# Patient Record
Sex: Female | Born: 1940 | Race: White | Hispanic: No | Marital: Married | Smoking: Never smoker
Health system: Southern US, Community
[De-identification: ages and names within clinical notes are randomized; demographics above are authoritative.]

## PROBLEM LIST (undated history)

## (undated) DIAGNOSIS — N186 End stage renal disease: Secondary | ICD-10-CM

## (undated) DIAGNOSIS — N189 Chronic kidney disease, unspecified: Secondary | ICD-10-CM

## (undated) DIAGNOSIS — D631 Anemia in chronic kidney disease: Secondary | ICD-10-CM

## (undated) DIAGNOSIS — H35039 Hypertensive retinopathy, unspecified eye: Secondary | ICD-10-CM

## (undated) DIAGNOSIS — M1990 Unspecified osteoarthritis, unspecified site: Secondary | ICD-10-CM

## (undated) DIAGNOSIS — I719 Aortic aneurysm of unspecified site, without rupture: Secondary | ICD-10-CM

## (undated) DIAGNOSIS — I350 Nonrheumatic aortic (valve) stenosis: Secondary | ICD-10-CM

## (undated) DIAGNOSIS — E785 Hyperlipidemia, unspecified: Secondary | ICD-10-CM

## (undated) DIAGNOSIS — M48062 Spinal stenosis, lumbar region with neurogenic claudication: Secondary | ICD-10-CM

## (undated) DIAGNOSIS — M5416 Radiculopathy, lumbar region: Secondary | ICD-10-CM

## (undated) DIAGNOSIS — D649 Anemia, unspecified: Secondary | ICD-10-CM

## (undated) DIAGNOSIS — E213 Hyperparathyroidism, unspecified: Secondary | ICD-10-CM

## (undated) DIAGNOSIS — Z9221 Personal history of antineoplastic chemotherapy: Secondary | ICD-10-CM

## (undated) DIAGNOSIS — I10 Essential (primary) hypertension: Secondary | ICD-10-CM

## (undated) DIAGNOSIS — D472 Monoclonal gammopathy: Secondary | ICD-10-CM

## (undated) DIAGNOSIS — T753XXA Motion sickness, initial encounter: Secondary | ICD-10-CM

## (undated) DIAGNOSIS — E119 Type 2 diabetes mellitus without complications: Secondary | ICD-10-CM

## (undated) DIAGNOSIS — Z923 Personal history of irradiation: Secondary | ICD-10-CM

## (undated) DIAGNOSIS — T884XXA Failed or difficult intubation, initial encounter: Secondary | ICD-10-CM

## (undated) DIAGNOSIS — H409 Unspecified glaucoma: Secondary | ICD-10-CM

## (undated) DIAGNOSIS — G8929 Other chronic pain: Secondary | ICD-10-CM

## (undated) DIAGNOSIS — G459 Transient cerebral ischemic attack, unspecified: Secondary | ICD-10-CM

## (undated) DIAGNOSIS — T8859XA Other complications of anesthesia, initial encounter: Secondary | ICD-10-CM

## (undated) DIAGNOSIS — E113299 Type 2 diabetes mellitus with mild nonproliferative diabetic retinopathy without macular edema, unspecified eye: Secondary | ICD-10-CM

## (undated) DIAGNOSIS — Z992 Dependence on renal dialysis: Secondary | ICD-10-CM

## (undated) DIAGNOSIS — K219 Gastro-esophageal reflux disease without esophagitis: Secondary | ICD-10-CM

## (undated) DIAGNOSIS — M545 Low back pain, unspecified: Secondary | ICD-10-CM

## (undated) DIAGNOSIS — C801 Malignant (primary) neoplasm, unspecified: Secondary | ICD-10-CM

## (undated) DIAGNOSIS — R011 Cardiac murmur, unspecified: Secondary | ICD-10-CM

## (undated) HISTORY — DX: Type 2 diabetes mellitus without complications: E11.9

## (undated) HISTORY — DX: Essential (primary) hypertension: I10

## (undated) HISTORY — PX: OTHER SURGICAL HISTORY: SHX169

## (undated) HISTORY — DX: Chronic kidney disease, unspecified: N18.9

## (undated) HISTORY — PX: BREAST BIOPSY: SHX20

## (undated) HISTORY — PX: COLON SURGERY: SHX602

## (undated) HISTORY — PX: TUBAL LIGATION: SHX77

## (undated) HISTORY — PX: BACK SURGERY: SHX140

## (undated) HISTORY — DX: Anemia, unspecified: D64.9

---
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
## 2008-10-31 DIAGNOSIS — R42 Dizziness and giddiness: Secondary | ICD-10-CM | POA: Insufficient documentation

## 2008-10-31 DIAGNOSIS — E7800 Pure hypercholesterolemia, unspecified: Secondary | ICD-10-CM | POA: Insufficient documentation

## 2009-03-10 DIAGNOSIS — C801 Malignant (primary) neoplasm, unspecified: Secondary | ICD-10-CM

## 2009-03-10 DIAGNOSIS — C20 Malignant neoplasm of rectum: Secondary | ICD-10-CM

## 2009-03-10 HISTORY — DX: Malignant neoplasm of rectum: C20

## 2009-03-10 HISTORY — DX: Malignant (primary) neoplasm, unspecified: C80.1

## 2009-03-29 DIAGNOSIS — C20 Malignant neoplasm of rectum: Secondary | ICD-10-CM | POA: Insufficient documentation

## 2009-04-03 DIAGNOSIS — I10 Essential (primary) hypertension: Secondary | ICD-10-CM | POA: Insufficient documentation

## 2010-03-26 DIAGNOSIS — E119 Type 2 diabetes mellitus without complications: Secondary | ICD-10-CM | POA: Insufficient documentation

## 2012-08-10 DIAGNOSIS — H40003 Preglaucoma, unspecified, bilateral: Secondary | ICD-10-CM | POA: Insufficient documentation

## 2012-08-10 DIAGNOSIS — H43813 Vitreous degeneration, bilateral: Secondary | ICD-10-CM | POA: Insufficient documentation

## 2014-01-04 DIAGNOSIS — M25539 Pain in unspecified wrist: Secondary | ICD-10-CM | POA: Insufficient documentation

## 2014-01-19 DIAGNOSIS — R202 Paresthesia of skin: Secondary | ICD-10-CM | POA: Insufficient documentation

## 2015-01-11 DIAGNOSIS — H26499 Other secondary cataract, unspecified eye: Secondary | ICD-10-CM | POA: Insufficient documentation

## 2015-01-11 DIAGNOSIS — Z961 Presence of intraocular lens: Secondary | ICD-10-CM | POA: Insufficient documentation

## 2017-05-19 DIAGNOSIS — C9000 Multiple myeloma not having achieved remission: Secondary | ICD-10-CM | POA: Insufficient documentation

## 2017-05-22 DIAGNOSIS — E790 Hyperuricemia without signs of inflammatory arthritis and tophaceous disease: Secondary | ICD-10-CM | POA: Insufficient documentation

## 2017-07-02 DIAGNOSIS — I350 Nonrheumatic aortic (valve) stenosis: Secondary | ICD-10-CM

## 2017-07-02 DIAGNOSIS — I5189 Other ill-defined heart diseases: Secondary | ICD-10-CM

## 2017-07-02 HISTORY — DX: Other ill-defined heart diseases: I51.89

## 2017-07-02 HISTORY — DX: Nonrheumatic aortic (valve) stenosis: I35.0

## 2017-08-19 DIAGNOSIS — S42126A Nondisplaced fracture of acromial process, unspecified shoulder, initial encounter for closed fracture: Secondary | ICD-10-CM | POA: Insufficient documentation

## 2017-11-10 DIAGNOSIS — D472 Monoclonal gammopathy: Secondary | ICD-10-CM | POA: Insufficient documentation

## 2018-08-31 DIAGNOSIS — N184 Chronic kidney disease, stage 4 (severe): Secondary | ICD-10-CM | POA: Insufficient documentation

## 2018-10-08 ENCOUNTER — Other Ambulatory Visit: Payer: Self-pay | Admitting: Family Medicine

## 2018-10-08 DIAGNOSIS — Z780 Asymptomatic menopausal state: Secondary | ICD-10-CM

## 2018-10-08 DIAGNOSIS — Z1231 Encounter for screening mammogram for malignant neoplasm of breast: Secondary | ICD-10-CM

## 2018-10-13 NOTE — Progress Notes (Signed)
[facility]  [address] [place], [place] [address] Phone: [phone]  Fax: [phone]   Clinic Day:  10/15/2018  Referring physician: Gayland Curry, MD  Chief Complaint: Misty [name] is a 78 y.o. female with a history of anemia of chronic kidney disease who is referred in consultation by Dr Misty [name] for assessment and management.   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 She has a history of monthly erythropoietin injections with Aranesp.   She was previously treated at the [facility] in [place], [place].  She has a history of rectal cancer.  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 Transrectal ultrasound confirmed a 4 cm mass from the anal verge, encompassing 1/3 of the circumference of the rectum. Clinical stage by EUS was T3N0.  Pre-op imaging showed no distant disease. She underwent neoadjuvant chemotherapy/radiation with 3 cycles of CI 5FU due to poor tolerance.  Resection on 08/08/2009 showed no residual carcinoma. She underwent a restorative proctectomy with colonic J-pouch and coloanal anastomosis. Two lymph nodes were removed; no tumor was noted. She declined adjuvant chemotherapy.  Colonoscopy in 03/2017 showed no evidence of recurrence.   She has a history of a monoclonal gammopathy of unknown significance (MGUS). SPEP in 05/2016 revealed no quantifiable monoclonal spike and no paraprotein in serum by immunofixation. There were free kappa light chains by urine immunofixation, unquantifiable by urine protein electrophoresis.   Bone marrow biopsy on 02/04/2017 was sub-optimal.  There was a markedly hemodilute bone marrow aspirate smear, inadequate for evaluation of hematopoesis. Core biopsy showed markedly hypocellular bone marrow, essentially devoid of hemapoietic precursors. Congo red stains on the core  biopsy and abdominal wall fat pad aspiration were negative.  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    She was briefly treated with Velcade and Decadron beginning in 08/12/2017.  Treatment was complicated by hyperglycemia and diarrhea.  Decision was made to pursue restaging.  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  There was very variably cellular marrow, overall 50% with trilineage hematopoiesis.  There were abnormal plasma cells present at normal plasma cell background detected by flow cytometry.  Findings were consistent with a low level of involvement of bone marrow with the patient's previously diagnosed plasma cell neoplasm.  24-hour UPEP on 12/28/2017 revealed 1911 mg/24 hrs protein with no M-spike and normal immunofixation.  Kappa free light chains were 101, lambda free light chain 6.39 and a ratio of 15.81 (2.04 - 10.37).  Whole body MRI on 02/04/2018 revealed no evidence of discrete focal dominant marrow lesion.   CBC on 09/07/2018 included a hematocrit of 31.1, hemoglobin 10.0, MCV 91, platelets 287,000, white count 6200. CMP revealed a BUN of 23, creatinine 2.7. Calcium was 9.1, albumin 3.4, protein 6.5, and liver function tests normal.  Symptomatically, she feels "pretty good." She feels good in the morning and notes fatigue in there afternoon. She denies any fevers, sweats, or weight loss. She denies any headaches, visual changes, cough, shortness of breath, or chest pain. She notes diarrhea following her surgery in 2011 and takes Imodium daily. She notes that diarrhea is not every day, and often comes on severely. She monitors her diet and does not eat spicy food. She denies any urinary symptoms. She notes a prolapsed bladder. She has joint aches in her hips. She  denies any recurrent infections.   She currently takes oral iron 3x weekly.   She has an  appointment with Misty [name] in GI at the [facility] clinic for her chronic diarrhea. She will see Misty [name] in nephrology next week.    Past Medical History:  Diagnosis Date   Anemia    Chronic kidney disease    Diabetes mellitus without complication ([name])    type 2   Hypertension     Past Surgical History:  Procedure Laterality Date   coloretcal cancer     thyroid gland     surgery / unknown date    History reviewed. No pertinent family history.  Social History:  reports that she has never smoked. She has never used smokeless tobacco. She reports that she does not drink alcohol. No history on file for drug. She is originally from [place] but moved to [place] in 1972. She and her husband moved from [place] to [place] to be near their grandchildren in 04/2018. She denies any alcohol or tobacco use. She denies any exposure to radiation or toxins. 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 She lives in [place]. The patient is alone today.  Allergies:  Allergies  Allergen Reactions   Penicillins Rash    Current Medications: Current Outpatient Medications  Medication Sig Dispense Refill   atorvastatin (LIPITOR) 40 MG tablet      B Complex-C-Folic Acid (RENAL) 1 MG CAPS TK 1 C PO D     carvedilol (COREG) 3.125 MG tablet      Cetirizine HCl 10 MG CAPS      clopidogrel (PLAVIX) 75 MG tablet Take by mouth.     colestipol (COLESTID) 1 g tablet Take by mouth.     Ferrous Sulfate (IRON PO) Take by mouth.     ferrous sulfate 325 (65 FE) MG tablet      Fiber Diet TABS Take by mouth.     gabapentin (NEURONTIN) 300 MG capsule      glimepiride (AMARYL) 2 MG tablet      glucose blood test strip 1 each by Finger Stick route 4 times daily (after meals and nightly). Use as directed     HUMALOG 100 UNIT/ML injection      hydrALAZINE (APRESOLINE) 25 MG tablet      insulin glargine (LANTUS) 100 UNIT/ML injection Inject into the skin.     INSULIN SYRINGE 1CC/29G 29G X 1/2" 1 ML  MISC      latanoprost (XALATAN) 0.005 % ophthalmic solution      lisinopril (ZESTRIL) 10 MG tablet      NIFEdipine (ADALAT CC) 60 MG 24 hr tablet TAKE 1 TABLET DAILY     SODIUM BICARBONATE PO Take by mouth.     SURE COMFORT PEN NEEDLES 31G X 8 MM MISC      acetaminophen (TYLENOL) 325 MG tablet Take by mouth.     No current facility-administered medications for this visit.     Review of Systems  Constitutional: Positive for malaise/fatigue. Negative for chills, diaphoresis, fever and weight loss.       Feels "pretty good."  HENT: Negative.  Negative for congestion, hearing loss, sinus pain and sore throat.   Eyes: Negative.  Negative for blurred vision.  Respiratory: Negative.  Negative for cough, shortness of breath and wheezing.   Cardiovascular: Negative.  Negative for chest pain, palpitations, orthopnea, leg swelling and PND.  Gastrointestinal: Positive for diarrhea (chronic). Negative for abdominal pain, blood in stool, constipation, melena, nausea  and vomiting.  Genitourinary: Negative.  Negative for dysuria, frequency, hematuria and urgency.       Prolapsed bladder  Musculoskeletal: Positive for joint pain (hips). Negative for back pain and myalgias.  Skin: Negative.  Negative for rash.  Neurological: Negative.  Negative for dizziness, tingling, sensory change, weakness and headaches.  Endo/Heme/Allergies: Negative.  Does not bruise/bleed easily.  Psychiatric/Behavioral: Negative.  Negative for depression, memory loss and substance abuse. The patient is not nervous/anxious and does not have insomnia.   All other systems reviewed and are negative.  Performance status (ECOG): 1  Vitals Blood pressure 135/66, pulse 62, temperature (!) 97 F (36.1 C), temperature source Tympanic, resp. rate 18, height '5\' 3"'$  (1.6 m), weight 133 lb 14.9 oz (60.7 kg), SpO2 100 %.   Physical Exam  Constitutional: She is oriented to person, place, and time. She appears well-developed and  well-nourished. No distress.  HENT:  Head: Normocephalic and atraumatic.  Mouth/Throat: Oropharynx is clear and moist. No oropharyngeal exudate.  Short styled white hair. Mask.  Eyes: Pupils are equal, round, and reactive to light. Conjunctivae and EOM are normal. No scleral icterus.  Blue eyes.  Neck: Normal range of motion. Neck supple.  Cardiovascular: Normal rate, regular rhythm and normal heart sounds.  No murmur heard. Pulmonary/Chest: Effort normal and breath sounds normal. No respiratory distress. She has no wheezes. She has no rales.  Abdominal: Soft. Bowel sounds are normal. She exhibits no distension and no mass. There is no abdominal tenderness. There is no rebound and no guarding.  Bruising secondary to insulin injections  Musculoskeletal: Normal range of motion.        General: No edema.  Lymphadenopathy:    She has no cervical adenopathy.    She has no axillary adenopathy.       Right: No supraclavicular adenopathy present.       Left: No supraclavicular adenopathy present.  Neurological: She is alert and oriented to person, place, and time.  Skin: Skin is warm and dry. She is not diaphoretic. No erythema.  Psychiatric: She has a normal mood and affect. Her behavior is normal. Judgment and thought content normal.  Nursing note and vitals reviewed.   No visits with results within 3 Day(s) from this visit.  Latest known visit with results is:  No results found for any previous visit.    Assessment:  Misty [name] is a 78 y.o. female with anemia of chronic renal disease.  She received monthly Aranesp while living in [place].  She has chronic renal insufficiency.  Creatinine was 2.7 (creatinine clearance 17 ml/min) on 09/07/2018.  She has a monoclonal gammopathy or unknown significance (MGUS).  Bone marrow biopsy on 02/04/2017 was sub-optimal.  Congo red stains and abdominal wall fat pad aspiration were negative.  She was briefly treated with Velcade and Decadron  beginning in 08/12/2017.  Treatment was complicated by hyperglycemia and diarrhea.  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  There was very variably cellular marrow, overall 50% with trilineage hematopoiesis.  There were abnormal plasma cells present at normal plasma cell background detected by flow cytometry.  Findings were consistent with a low level of involvement of bone marrow with the patient's previously diagnosed plasma cell neoplasm.  24-hour UPEP on 12/28/2017 revealed 1911 mg/24 hrs protein with no M-spike and normal immunofixation.  Kappa free light chains were 101, lambda free light chain 6.39 and a ratio of 15.81 (2.04 - 10.37).  Whole body MRI on 02/04/2018 revealed no evidence of discrete focal dominant marrow lesion.   She has a history of rectal cancer s/p neoadjuvant chemotherapy followed by resection on 08/08/2009. Transrectal ultrasound in 03/2009 confirmed a 4 cm mass from the anal verge, encompassing 1/3 of the circumference of the rectum. Clinical stage was T3N0.  She received 3 cycles of CI 5FU.  There was no residual carcinoma on resection. She underwent a restorative proctectomy with colonic J-pouch and coloanal anastomosis. Two lymph nodes were negative. She declined adjuvant chemotherapy.  Colonoscopy in 03/2017 showed no evidence of recurrence.   Symptomatically, she notes fatigue in the afternoon.  She has well managed diarrhea s/p surgery.  She denies any B symptoms.  Exam is unremarkable.  Plan: 1. Labs today: CBC with diff, ferritin, iron studies, B12, folate, myeloma panel, FLCA, CEA. 2. 24-hour UPEP and free light chains.  3. Anemia of chronic kidney disease  Review entire medical history, diagnosis and management of anemia of chronic kidney diseae.   She has received Aranesp in the past (obtain records).  Discuss ensuring adequate iron stores and vitamin levels.  Preauth  Aranesp. 4. Monoclonal gammopathy or unknown significance (MGUS)  Review entire medical history, diagnosis and management of MGUS.   She was briefly on treatment (intolerant) for ? myeloma.  Discuss restaging studies. 5. Rectal cancer   Review entire medical history, diagnosis and management of rectal cancer.   Discuss ongoing surveillance. 6. RTC in 1 week for MD assessment, review of work-up, labs (CBC), and +/- Aranesp.  I discussed the assessment and treatment plan with the patient.  The patient was provided an opportunity to ask questions and all were answered.  The patient agreed with the plan and demonstrated an understanding of the instructions.  The patient was advised to call back if the symptoms worsen or if the condition fails to improve as anticipated.   Onie Hayashi C. Mike Gip, MD, PhD    10/15/2018, 9:56 AM  I, Cloyde Reams Dorshimer, am acting as Education administrator for [employer]. Mike Gip, MD, PhD.  I, Ellen Goris C. Mike Gip, MD, have reviewed the above documentation for accuracy and completeness, and I agree with the above.

## 2018-10-14 ENCOUNTER — Other Ambulatory Visit: Payer: Self-pay

## 2018-10-15 ENCOUNTER — Encounter: Payer: Self-pay | Admitting: Hematology and Oncology

## 2018-10-15 ENCOUNTER — Inpatient Hospital Stay: Payer: Medicare Other | Attending: Hematology and Oncology | Admitting: Hematology and Oncology

## 2018-10-15 ENCOUNTER — Inpatient Hospital Stay: Payer: Medicare Other

## 2018-10-15 VITALS — BP 135/66 | HR 62 | Temp 97.0°F | Resp 18 | Ht 63.0 in | Wt 133.9 lb

## 2018-10-15 DIAGNOSIS — Z85048 Personal history of other malignant neoplasm of rectum, rectosigmoid junction, and anus: Secondary | ICD-10-CM

## 2018-10-15 DIAGNOSIS — N184 Chronic kidney disease, stage 4 (severe): Secondary | ICD-10-CM

## 2018-10-15 DIAGNOSIS — Z880 Allergy status to penicillin: Secondary | ICD-10-CM | POA: Diagnosis not present

## 2018-10-15 DIAGNOSIS — N179 Acute kidney failure, unspecified: Secondary | ICD-10-CM | POA: Insufficient documentation

## 2018-10-15 DIAGNOSIS — R197 Diarrhea, unspecified: Secondary | ICD-10-CM | POA: Insufficient documentation

## 2018-10-15 DIAGNOSIS — E1122 Type 2 diabetes mellitus with diabetic chronic kidney disease: Secondary | ICD-10-CM | POA: Insufficient documentation

## 2018-10-15 DIAGNOSIS — D631 Anemia in chronic kidney disease: Secondary | ICD-10-CM

## 2018-10-15 DIAGNOSIS — M25552 Pain in left hip: Secondary | ICD-10-CM | POA: Diagnosis not present

## 2018-10-15 DIAGNOSIS — C20 Malignant neoplasm of rectum: Secondary | ICD-10-CM

## 2018-10-15 DIAGNOSIS — I129 Hypertensive chronic kidney disease with stage 1 through stage 4 chronic kidney disease, or unspecified chronic kidney disease: Secondary | ICD-10-CM

## 2018-10-15 DIAGNOSIS — R5383 Other fatigue: Secondary | ICD-10-CM | POA: Diagnosis not present

## 2018-10-15 DIAGNOSIS — Z79899 Other long term (current) drug therapy: Secondary | ICD-10-CM | POA: Insufficient documentation

## 2018-10-15 DIAGNOSIS — D472 Monoclonal gammopathy: Secondary | ICD-10-CM

## 2018-10-15 DIAGNOSIS — Z794 Long term (current) use of insulin: Secondary | ICD-10-CM | POA: Diagnosis not present

## 2018-10-15 DIAGNOSIS — E1165 Type 2 diabetes mellitus with hyperglycemia: Secondary | ICD-10-CM | POA: Diagnosis not present

## 2018-10-15 DIAGNOSIS — S42113A Displaced fracture of body of scapula, unspecified shoulder, initial encounter for closed fracture: Secondary | ICD-10-CM | POA: Insufficient documentation

## 2018-10-15 DIAGNOSIS — M25551 Pain in right hip: Secondary | ICD-10-CM | POA: Insufficient documentation

## 2018-10-15 LAB — CBC WITH DIFFERENTIAL/PLATELET
Abs Immature Granulocytes: 0.04 10*3/uL (ref 0.00–0.07)
Basophils Absolute: 0.1 10*3/uL (ref 0.0–0.1)
Basophils Relative: 1 %
Eosinophils Absolute: 0.4 10*3/uL (ref 0.0–0.5)
Eosinophils Relative: 6 %
HCT: 33.3 % — ABNORMAL LOW (ref 36.0–46.0)
Hemoglobin: 11 g/dL — ABNORMAL LOW (ref 12.0–15.0)
Immature Granulocytes: 1 %
Lymphocytes Relative: 19 %
Lymphs Abs: 1.4 10*3/uL (ref 0.7–4.0)
MCH: 29.3 pg (ref 26.0–34.0)
MCHC: 33 g/dL (ref 30.0–36.0)
MCV: 88.6 fL (ref 80.0–100.0)
Monocytes Absolute: 0.4 10*3/uL (ref 0.1–1.0)
Monocytes Relative: 6 %
Neutro Abs: 5.1 10*3/uL (ref 1.7–7.7)
Neutrophils Relative %: 67 %
Platelets: 304 10*3/uL (ref 150–400)
RBC: 3.76 MIL/uL — ABNORMAL LOW (ref 3.87–5.11)
RDW: 13.3 % (ref 11.5–15.5)
WBC: 7.4 10*3/uL (ref 4.0–10.5)
nRBC: 0 % (ref 0.0–0.2)

## 2018-10-15 LAB — IRON AND TIBC
Iron: 82 ug/dL (ref 28–170)
Saturation Ratios: 26 % (ref 10.4–31.8)
TIBC: 315 ug/dL (ref 250–450)
UIBC: 233 ug/dL

## 2018-10-15 LAB — VITAMIN B12: Vitamin B-12: 511 pg/mL (ref 180–914)

## 2018-10-15 LAB — FOLATE: Folate: >100 ng/mL (ref >5.9)

## 2018-10-15 LAB — FERRITIN: Ferritin: 135 ng/mL (ref 11–307)

## 2018-10-16 LAB — CEA: CEA: 1.5 ng/mL (ref 0.0–4.7)

## 2018-10-17 LAB — KAPPA/LAMBDA LIGHT CHAINS
Kappa free light chain: 110.2 mg/L — ABNORMAL HIGH (ref 3.3–19.4)
Kappa, lambda light chain ratio: 2.86 — ABNORMAL HIGH (ref 0.26–1.65)
Lambda free light chains: 38.5 mg/L — ABNORMAL HIGH (ref 5.7–26.3)

## 2018-10-18 ENCOUNTER — Other Ambulatory Visit: Payer: Self-pay

## 2018-10-18 DIAGNOSIS — I129 Hypertensive chronic kidney disease with stage 1 through stage 4 chronic kidney disease, or unspecified chronic kidney disease: Secondary | ICD-10-CM | POA: Diagnosis not present

## 2018-10-18 DIAGNOSIS — D631 Anemia in chronic kidney disease: Secondary | ICD-10-CM

## 2018-10-18 DIAGNOSIS — D472 Monoclonal gammopathy: Secondary | ICD-10-CM

## 2018-10-19 LAB — MULTIPLE MYELOMA PANEL, SERUM
Albumin SerPl Elph-Mcnc: 3.9 g/dL (ref 2.9–4.4)
Albumin/Glob SerPl: 1.2 (ref 0.7–1.7)
Alpha 1: 0.2 g/dL (ref 0.0–0.4)
Alpha2 Glob SerPl Elph-Mcnc: 1.1 g/dL — ABNORMAL HIGH (ref 0.4–1.0)
B-Globulin SerPl Elph-Mcnc: 1 g/dL (ref 0.7–1.3)
Gamma Glob SerPl Elph-Mcnc: 0.9 g/dL (ref 0.4–1.8)
Globulin, Total: 3.3 g/dL (ref 2.2–3.9)
IgA: 179 mg/dL (ref 64–422)
IgG (Immunoglobin G), Serum: 1039 mg/dL (ref 586–1602)
IgM (Immunoglobulin M), Srm: 66 mg/dL (ref 26–217)
Total Protein ELP: 7.2 g/dL (ref 6.0–8.5)

## 2018-10-19 LAB — FREE K+L LT CHAINS,QN,UR
Free Kappa Lt Chains,Ur: 110.79 mg/L (ref 0.63–113.79)
Free Kappa/Lambda Ratio: 13.95 (ref 1.03–31.76)
Free Lambda Lt Chains,Ur: 7.94 mg/L (ref 0.47–11.77)
Total Volume: 2050

## 2018-10-19 NOTE — Progress Notes (Signed)
[facility]  [address] [place], [place] [address] Phone: [phone]  Fax: [phone]   Clinic Day:  10/21/2018  Referring physician: Gayland Curry, MD  Chief Complaint: Misty [name] is a 78 y.o. female with anemia of chronic kidney disease who is seen for review of work-up and discussion regarding direction of therapy.  HPI: The patient was last seen in the medical oncology clinic on 10/15/2018 for initial consultation.  She has anemia.  She had a history of monoclonal gammopathy of unknown significance as well as a history of rectal cancer.  Symptomatically, she felt "pretty good".  She noted fatigue in the afternoons. She had chronic diarrhea following surgery in 2011; she takes Imodium daily.  She described aches in her hips. She was taking oral iron 3x weekly.   Work-up included a hematocrit 33.3, hemoglobin 11.0, MCV 88.6, platelets 304,000, and WBC 7,400. Differential was normal.  Ferritin was 135 with an iron saturation of 26% and a TIBC 315.  Vitamin B-12 was 511.  Folate >100.0. SPEP revealed no M-spike. Kappa free light chains 110.2, lambda free light chains 38.5. and ratio 2.86 (0.26-1.65).  24 hour UPEP revealed 1384 mg/24 hours of protein without a monoclonal spike.  CEA was 1.5.  She saw Dr Candiss Norse since her last visit.   She saw Stephens November, NP, from the [facility].  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  During the interim, the patient feels "pretty good".  Her BP has been elevated x 1 week. Her BP is 178/62 in the clinic today. She will contact her PCP if her BP continues to remain elevated.   Past Medical History:  Diagnosis Date  . Anemia   . Chronic kidney disease   . Diabetes mellitus without complication ([place])    type 2  . Hypertension     Past Surgical History:  Procedure Laterality Date  . coloretcal cancer    . thyroid gland     surgery / unknown date    History reviewed. No pertinent family history.   Social History:  reports that she has never smoked. She has never used smokeless tobacco. She reports that she does not drink alcohol. No history on file for drug.  She is originally from [place] but moved to [place] in 1972. She and her husband moved from [place] to [place] to be near their grandchildren in 04/2018. She denies any alcohol or tobacco use. She denies any exposure to radiation or toxins. 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 She lives in [place]. The patient is alone today.  Allergies:  Allergies  Allergen Reactions  . Penicillins Rash    Current Medications: Current Outpatient Medications  Medication Sig Dispense Refill  . acetaminophen (TYLENOL) 325 MG tablet Take 325 mg by mouth every 4 (four) hours as needed.     Marland Kitchen atorvastatin (LIPITOR) 40 MG tablet Take 40 mg by mouth daily at 6 PM.     . B Complex-C-Folic Acid (RENAL) 1 MG CAPS TK 1 C PO D    . carvedilol (COREG) 3.125 MG tablet Take 3.125 mg by mouth 2 (two) times daily with a meal.     . Cetirizine HCl 10 MG CAPS Take 1 capsule by mouth daily.     . clopidogrel (PLAVIX) 75 MG tablet Take 75 mg by mouth daily.     . colestipol (COLESTID) 1 g tablet Take 1 g by mouth 2 (two) times daily.     Marland Kitchen  ferrous sulfate 325 (65 FE) MG tablet Take 325 mg by mouth 3 (three) times a week.     . Fiber Diet TABS Take 2 tablets by mouth 2 (two) times daily.     Marland Kitchen gabapentin (NEURONTIN) 300 MG capsule Take 300 mg by mouth 2 (two) times daily.     Marland Kitchen glimepiride (AMARYL) 2 MG tablet Take 2 mg by mouth daily with breakfast.     . glucose blood test strip 1 each by Finger Stick route 4 times daily (after meals and nightly). Use as directed    . HUMALOG 100 UNIT/ML injection Inject into the skin as directed. Sliding Scale    . hydrALAZINE (APRESOLINE) 25 MG tablet Take 25 mg by mouth daily.     . insulin glargine (LANTUS) 100 UNIT/ML injection Inject 35 Units into the skin 2 (two) times daily.     . INSULIN SYRINGE 1CC/29G 29G X 1/2" 1 ML  MISC     . latanoprost (XALATAN) 0.005 % ophthalmic solution Place 1 drop into the left eye at bedtime.     Marland Kitchen lisinopril (ZESTRIL) 10 MG tablet Take 10 mg by mouth daily.     Marland Kitchen NIFEdipine (ADALAT CC) 60 MG 24 hr tablet Take 60 mg by mouth daily.     . sodium bicarbonate 650 MG tablet Take 650 mg by mouth daily.     . SURE COMFORT PEN NEEDLES 31G X 8 MM MISC      No current facility-administered medications for this visit.     Review of Systems  Constitutional: Negative for chills, diaphoresis, fever, malaise/fatigue and weight loss (up 2 lbs).       Feels "pretty good".  HENT: Negative for congestion, ear discharge, ear pain, hearing loss, nosebleeds, sinus pain, sore throat and tinnitus.   Eyes: Negative for blurred vision and double vision.  Respiratory: Negative for cough, hemoptysis, sputum production, shortness of breath and stridor.   Cardiovascular: Negative for chest pain, palpitations and leg swelling.  Gastrointestinal: Positive for diarrhea (chronic). Negative for abdominal pain, blood in stool, constipation, melena, nausea and vomiting.  Genitourinary: Negative for dysuria, frequency, hematuria and urgency.       Prolapsed bladder.  Musculoskeletal: Positive for joint pain (hips). Negative for back pain, myalgias and neck pain.  Skin: Negative for itching and rash.  Neurological: Negative for dizziness, tingling, sensory change, weakness and headaches.  Endo/Heme/Allergies: Does not bruise/bleed easily.  Psychiatric/Behavioral: Negative for depression and memory loss. The patient is not nervous/anxious and does not have insomnia.   All other systems reviewed and are negative.  Performance status (ECOG): 1-2  Vitals Blood pressure (!) 178/62, pulse 67, temperature 98.4 F (36.9 C), temperature source Tympanic, resp. rate 16, weight 135 lb 9.3 oz (61.5 kg), SpO2 99 %.  Physical Exam  Constitutional: She is oriented to person, place, and time. She appears well-developed and  well-nourished. No distress.  HENT:  Head: Normocephalic and atraumatic.  Short blonde/white hair.  Mask.  Eyes: Conjunctivae and EOM are normal. No scleral icterus.  Neurological: She is alert and oriented to person, place, and time.  Skin: She is not diaphoretic. No pallor.  Psychiatric: She has a normal mood and affect. Her behavior is normal. Judgment and thought content normal.  Nursing note and vitals reviewed.   Appointment on 10/21/2018  Component Date Value Ref Range Status  . WBC 10/21/2018 7.7  4.0 - 10.5 K/uL Final  . RBC 10/21/2018 3.31* 3.87 - 5.11 MIL/uL Final  .  Hemoglobin 10/21/2018 9.7* 12.0 - 15.0 g/dL Final  . HCT 10/21/2018 29.2* 36.0 - 46.0 % Final  . MCV 10/21/2018 88.2  80.0 - 100.0 fL Final  . MCH 10/21/2018 29.3  26.0 - 34.0 pg Final  . MCHC 10/21/2018 33.2  30.0 - 36.0 g/dL Final  . RDW 10/21/2018 13.3  11.5 - 15.5 % Final  . Platelets 10/21/2018 253  150 - 400 K/uL Final  . nRBC 10/21/2018 0.0  0.0 - 0.2 % Final   Performed at [facility], [address], [place], [place] [address]    Assessment:  Misty [name] is a 78 y.o. female with anemia of chronic renal disease.  She has chronic renal insufficiency.  Creatinine was 2.7 (creatinine clearance 17 ml/min) on 09/07/2018.  Work-up on 10/15/2018 revealed a hematocrit 33.3, hemoglobin 11.0, MCV 88.6, platelets 304,000, and WBC 7,400. Differential was normal.  Ferritin was 135 with an iron saturation of 26% and a TIBC 315.  Vitamin B-12 was 511.  Folate >100.0. SPEP revealed no M-spike. Kappa free light chains 110.2, lambda free light chains 38.5. and ratio 2.86 (0.26-1.65).  24 hour UPEP revealed 1384 mg/24 hours of protein without a monoclonal spike.  She has a monoclonal gammopathy or unknown significance (MGUS).  Bone marrow biopsy on 02/04/2017 was sub-optimal.  Congo red stains and abdominal wall fat pad aspiration were negative.  She was briefly treated with Velcade and Decadron  beginning in 08/12/2017.  Treatment was complicated by hyperglycemia and diarrhea.  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  There was very variably cellular marrow, overall 50% with trilineage hematopoiesis.  There were abnormal plasma cells present at normal plasma cell background detected by flow cytometry.  Findings were consistent with a low level of involvement of bone marrow with the patient's previously diagnosed plasma cell neoplasm.  24-hour UPEP on 12/28/2017 revealed 1911 mg/24 hrs protein with no M-spike and normal immunofixation.  Kappa free light chains were 101, lambda free light chain 6.39 and a ratio of 15.81 (2.04 - 10.37).  Whole body MRI on 02/04/2018 revealed no evidence of discrete focal dominant marrow lesion.   She has a history of rectal cancer s/p neoadjuvant chemotherapy followed by resection on 08/08/2009. Transrectal ultrasound in 03/2009 confirmed a 4 cm mass from the anal verge, encompassing 1/3 of the circumference of the rectum. Clinical stage was T3N0.  She received 3 cycles of CI 5FU.  There was no residual carcinoma on resection. She underwent a restorative proctectomy with colonic J-pouch and coloanal anastomosis. Two lymph nodes were negative. She declined adjuvant chemotherapy.  Colonoscopy in 03/2017 showed no evidence of recurrence.  CEA was 1.5 on 10/15/2018.  Symptomatically, she is doing well.  Plan: 1.   Anemia of chronic kidney disease             Clinically, she is doing well.  Hematocrit 33.3.  Hemoglobin 11.0.    Discuss plan to initiate Retacrit or Aranesp when hemoglobin < 10. 2.   Monoclonal gammopathy or unknown significance (MGUS)             M-spike was 0.  Free light chains showed a slight excess of kappa free light chains.  Continue to monitor. 3.   Rectal cancer              Clinically doing well. 4.   No Retacrit today. 5.   RTC monthly x 2 for labs (Hgb/HCT)  and +/- Retacrit. 6.   RTC in 3 months for MD assessment, labs (CBC with diff, ferritin), and +/- Retacrit.  I discussed the assessment and treatment plan with the patient.  The patient was provided an opportunity to ask questions and all were answered.  The patient agreed with the plan and demonstrated an understanding of the instructions.  The patient was advised to call back if the symptoms worsen or if the condition fails to improve as anticipated.  I provided 10 minutes of face-to-face time during this this encounter and > 50% was spent counseling as documented under my assessment and plan.    Lequita Asal, MD, PhD    10/21/2018, 3:03 PM  I, Selena Batten, am acting as scribe for [employer]. Mike Gip, MD, PhD.  I, Melissa C. Mike Gip, MD, have reviewed the above documentation for accuracy and completeness, and I agree with the above.

## 2018-10-20 ENCOUNTER — Other Ambulatory Visit: Payer: Self-pay

## 2018-10-20 LAB — UPEP/TP, 24-HR URINE
Albumin, U: 70.8 %
Alpha 1, Urine: 4.9 %
Alpha 2, Urine: 5.1 %
Beta, Urine: 11.1 %
Gamma Globulin, Urine: 8.1 %
Total Protein, Urine-Ur/day: 1384 mg/24 hr — ABNORMAL HIGH (ref 30–150)
Total Protein, Urine: 67.5 mg/dL
Total Volume: 2050

## 2018-10-21 ENCOUNTER — Other Ambulatory Visit
Admission: RE | Admit: 2018-10-21 | Discharge: 2018-10-21 | Disposition: A | Discharge status: Home / Self Care | Payer: Medicare Other | Source: Ambulatory Visit | Attending: Gastroenterology | Admitting: Gastroenterology

## 2018-10-21 ENCOUNTER — Inpatient Hospital Stay (HOSPITAL_BASED_OUTPATIENT_CLINIC_OR_DEPARTMENT_OTHER): Payer: Medicare Other | Admitting: Hematology and Oncology

## 2018-10-21 ENCOUNTER — Inpatient Hospital Stay: Payer: Medicare Other

## 2018-10-21 ENCOUNTER — Encounter: Payer: Self-pay | Admitting: Hematology and Oncology

## 2018-10-21 ENCOUNTER — Other Ambulatory Visit: Payer: Self-pay

## 2018-10-21 VITALS — BP 178/62 | HR 67 | Temp 98.4°F | Resp 16 | Wt 135.6 lb

## 2018-10-21 DIAGNOSIS — C20 Malignant neoplasm of rectum: Secondary | ICD-10-CM

## 2018-10-21 DIAGNOSIS — R197 Diarrhea, unspecified: Secondary | ICD-10-CM | POA: Diagnosis present

## 2018-10-21 DIAGNOSIS — D472 Monoclonal gammopathy: Secondary | ICD-10-CM

## 2018-10-21 DIAGNOSIS — N184 Chronic kidney disease, stage 4 (severe): Secondary | ICD-10-CM | POA: Diagnosis not present

## 2018-10-21 DIAGNOSIS — D631 Anemia in chronic kidney disease: Secondary | ICD-10-CM | POA: Diagnosis not present

## 2018-10-21 DIAGNOSIS — I129 Hypertensive chronic kidney disease with stage 1 through stage 4 chronic kidney disease, or unspecified chronic kidney disease: Secondary | ICD-10-CM | POA: Diagnosis not present

## 2018-10-21 LAB — GASTROINTESTINAL PANEL BY PCR, STOOL (REPLACES STOOL CULTURE)

## 2018-10-21 LAB — CBC
HCT: 29.2 % — ABNORMAL LOW (ref 36.0–46.0)
Hemoglobin: 9.7 g/dL — ABNORMAL LOW (ref 12.0–15.0)
MCH: 29.3 pg (ref 26.0–34.0)
MCHC: 33.2 g/dL (ref 30.0–36.0)
MCV: 88.2 fL (ref 80.0–100.0)
Platelets: 253 10*3/uL (ref 150–400)
RBC: 3.31 MIL/uL — ABNORMAL LOW (ref 3.87–5.11)
RDW: 13.3 % (ref 11.5–15.5)
WBC: 7.7 10*3/uL (ref 4.0–10.5)
nRBC: 0 % (ref 0.0–0.2)

## 2018-10-21 LAB — C DIFFICILE QUICK SCREEN W PCR REFLEX
C Diff antigen: NEGATIVE
C Diff interpretation: NOT DETECTED
C Diff toxin: NEGATIVE

## 2018-10-21 NOTE — Progress Notes (Signed)
Pt here for follow up. Denies any concerns.  

## 2018-10-25 ENCOUNTER — Ambulatory Visit: Payer: Medicare Other

## 2018-10-25 ENCOUNTER — Ambulatory Visit: Payer: Medicare Other | Admitting: Hematology and Oncology

## 2018-10-25 ENCOUNTER — Other Ambulatory Visit: Payer: Medicare Other

## 2018-10-26 LAB — PANCREATIC ELASTASE, FECAL: Pancreatic Elastase-1, Stool: 302 ug Elast./g (ref >200)

## 2018-11-02 LAB — CALPROTECTIN, FECAL: Calprotectin, Fecal: 23 ug/g (ref 0–120)

## 2018-11-18 ENCOUNTER — Other Ambulatory Visit: Payer: Self-pay | Admitting: Hematology and Oncology

## 2018-11-18 ENCOUNTER — Other Ambulatory Visit: Payer: Self-pay

## 2018-11-18 ENCOUNTER — Inpatient Hospital Stay: Payer: Medicare Other | Attending: Hematology and Oncology

## 2018-11-18 ENCOUNTER — Inpatient Hospital Stay: Payer: Medicare Other

## 2018-11-18 DIAGNOSIS — D631 Anemia in chronic kidney disease: Secondary | ICD-10-CM | POA: Insufficient documentation

## 2018-11-18 DIAGNOSIS — N184 Chronic kidney disease, stage 4 (severe): Secondary | ICD-10-CM | POA: Diagnosis not present

## 2018-11-18 LAB — HEMOGLOBIN AND HEMATOCRIT, BLOOD
HCT: 30.2 % — ABNORMAL LOW (ref 36.0–46.0)
Hemoglobin: 10 g/dL — ABNORMAL LOW (ref 12.0–15.0)

## 2018-11-18 NOTE — Progress Notes (Signed)
Hemoglobin improved today 10.0. MD has not Heard back from her [place] MD regarding aranesp dosage. Per MD no injection needed today. CMA reaching out to [place] clinic for information/records. Patient reports she is feeling weak and tired, she was hoping to get the shot today. Office will call patient once we receive info from [place]. Patient does have a return appt to [place] in 1 month.

## 2018-11-23 ENCOUNTER — Ambulatory Visit
Admission: RE | Admit: 2018-11-23 | Discharge: 2018-11-23 | Disposition: A | Discharge status: Home / Self Care | Payer: Medicare Other | Source: Ambulatory Visit | Attending: Family Medicine | Admitting: Family Medicine

## 2018-11-23 ENCOUNTER — Other Ambulatory Visit: Payer: Self-pay

## 2018-11-23 DIAGNOSIS — Z1231 Encounter for screening mammogram for malignant neoplasm of breast: Secondary | ICD-10-CM

## 2018-11-23 DIAGNOSIS — Z13820 Encounter for screening for osteoporosis: Secondary | ICD-10-CM | POA: Diagnosis not present

## 2018-11-23 DIAGNOSIS — M8580 Other specified disorders of bone density and structure, unspecified site: Secondary | ICD-10-CM | POA: Insufficient documentation

## 2018-11-23 DIAGNOSIS — Z7902 Long term (current) use of antithrombotics/antiplatelets: Secondary | ICD-10-CM | POA: Diagnosis not present

## 2018-11-23 DIAGNOSIS — Z780 Asymptomatic menopausal state: Secondary | ICD-10-CM

## 2018-11-23 DIAGNOSIS — Z794 Long term (current) use of insulin: Secondary | ICD-10-CM | POA: Diagnosis not present

## 2018-11-23 DIAGNOSIS — E119 Type 2 diabetes mellitus without complications: Secondary | ICD-10-CM | POA: Diagnosis not present

## 2018-11-23 HISTORY — DX: Personal history of irradiation: Z92.3

## 2018-11-23 HISTORY — DX: Personal history of antineoplastic chemotherapy: Z92.21

## 2018-11-23 IMAGING — MG MM DIGITAL SCREENING BILAT W/ TOMO W/ CAD
8 series · 9 of 24 positions shown · non-contrast
Comparison: None.

CLINICAL DATA: Screening.

EXAM:
DIGITAL SCREENING BILATERAL MAMMOGRAM WITH TOMO AND CAD

[R MLO synth-2D]
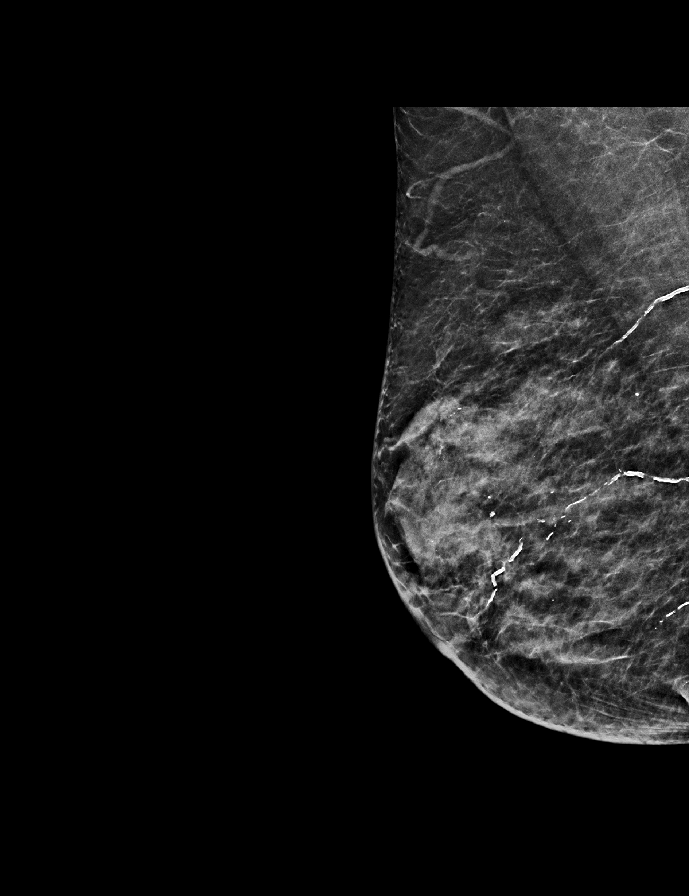

[R CC synth-2D]
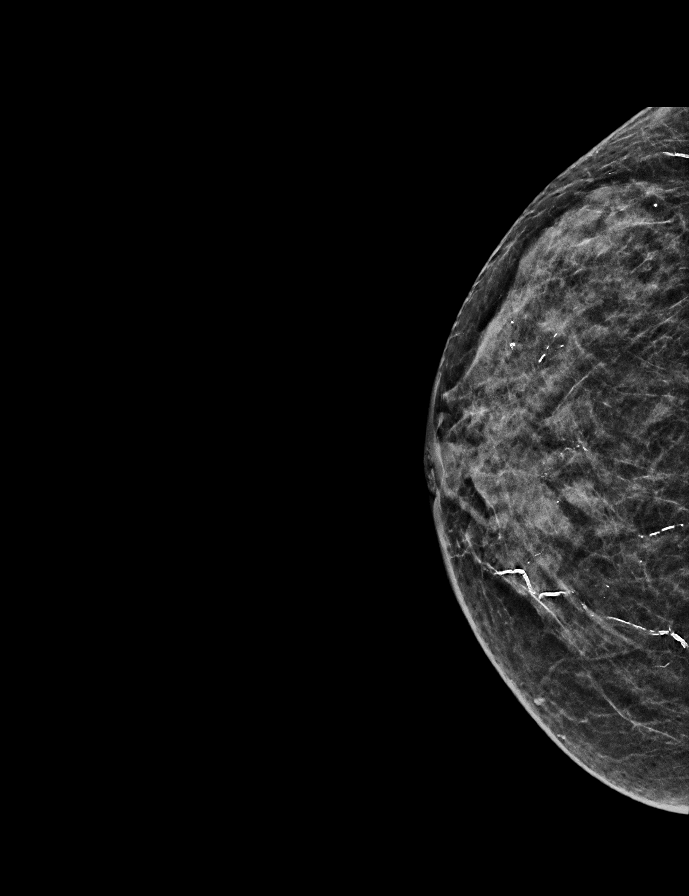

[L CC synth-2D]
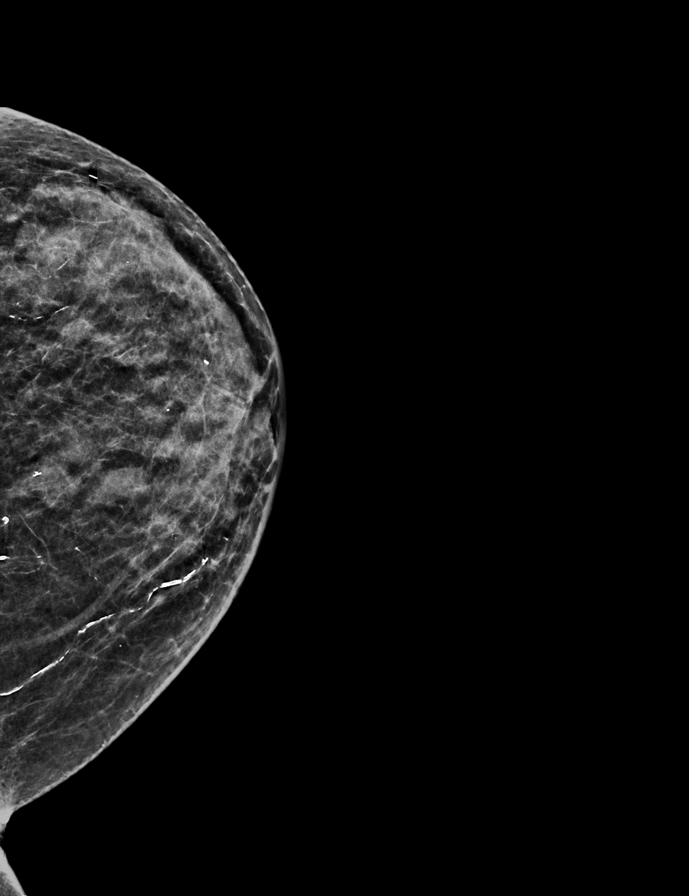

[L MLO synth-2D]
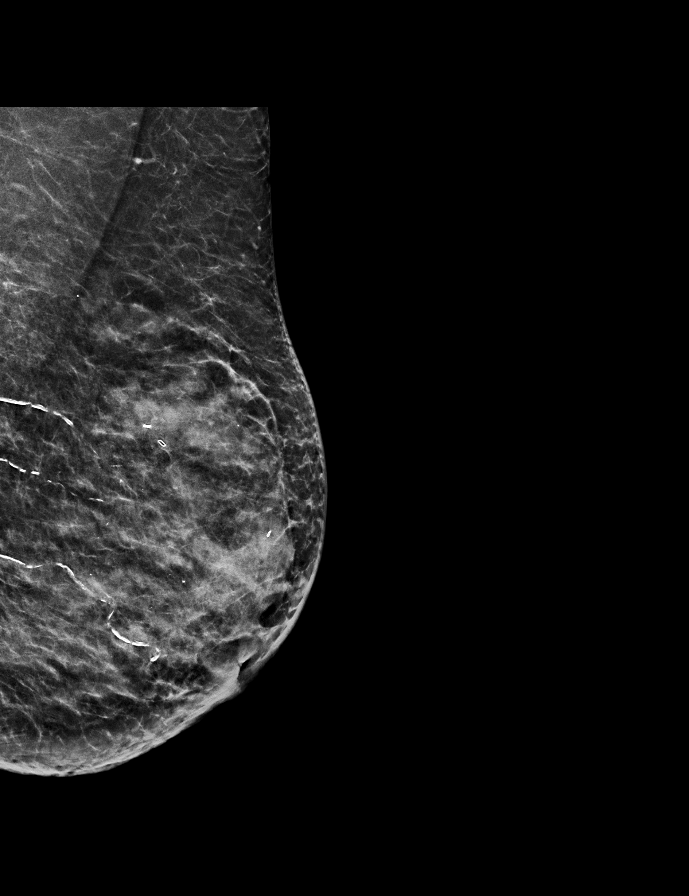

[L CC tomo · 2 of 45 frames shown]
[frame 15/45]
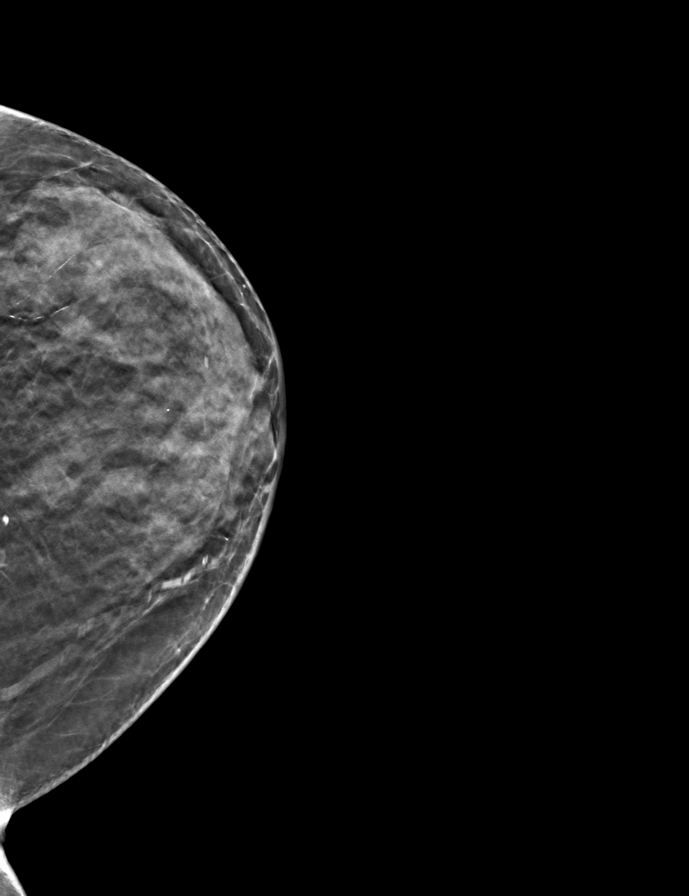
[frame 23/45]
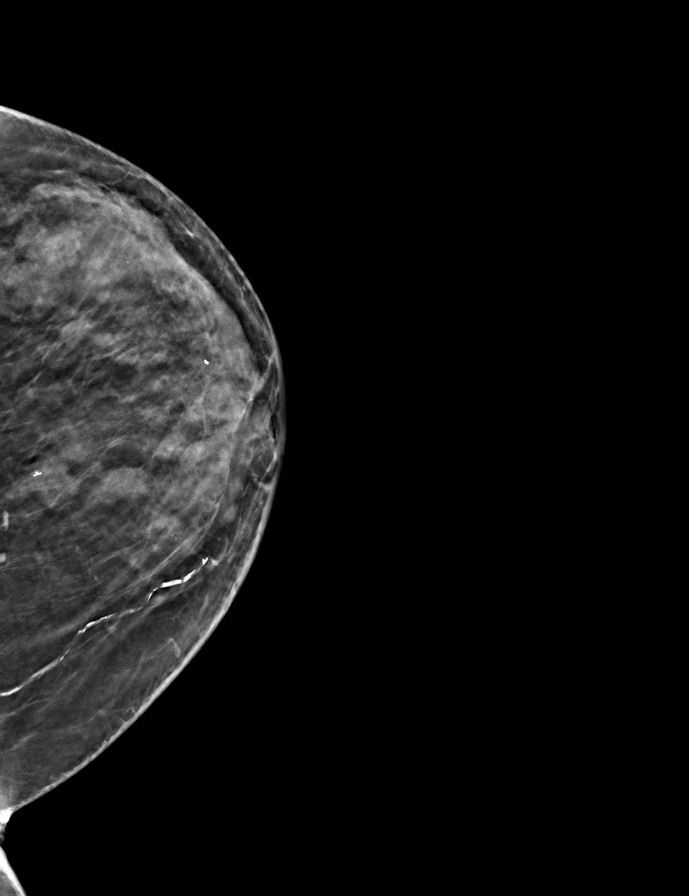

[L MLO tomo · tomo slice 27/53.0]
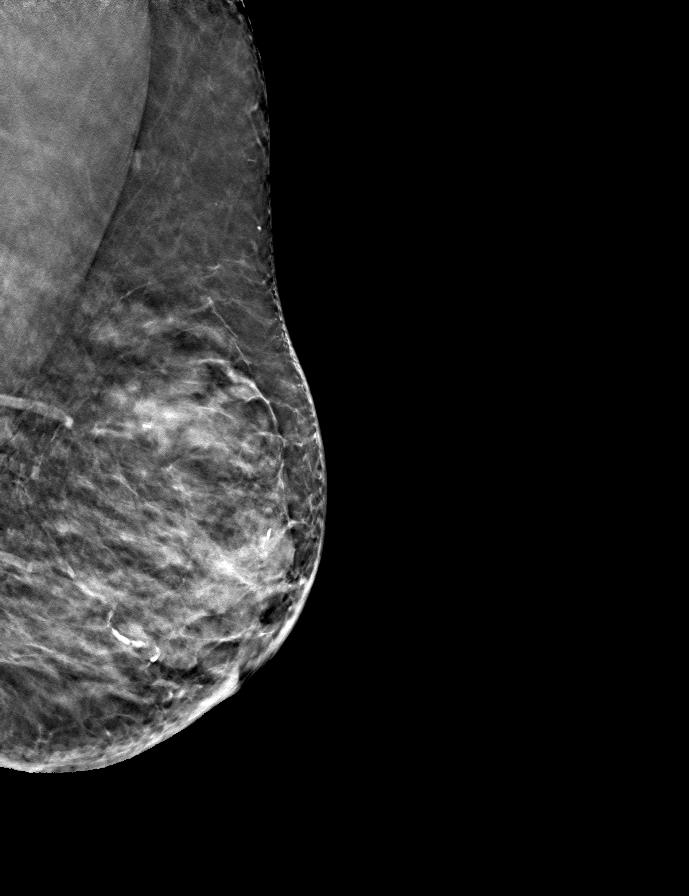

[R MLO tomo · tomo slice 25/48.0]
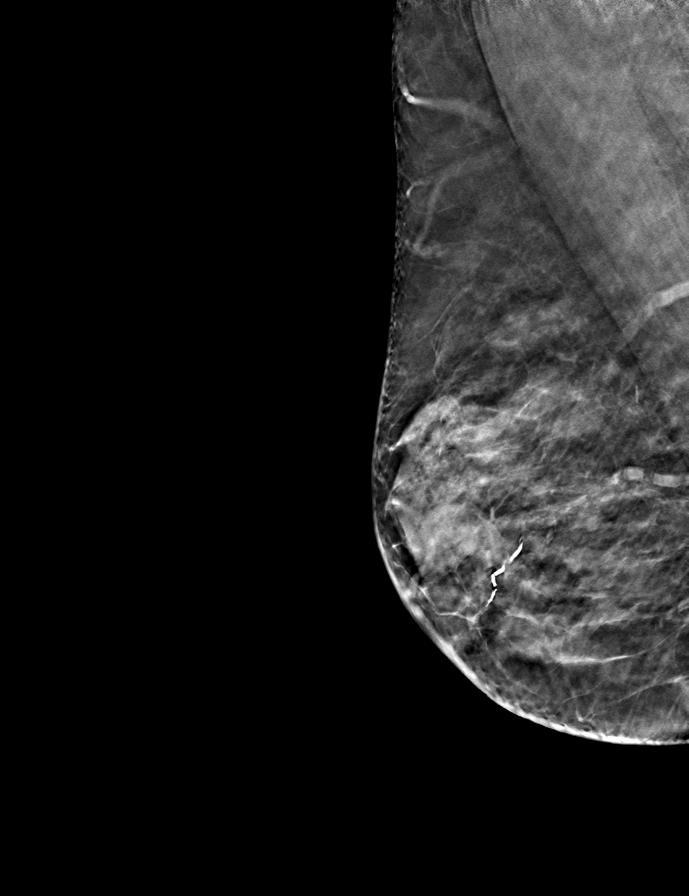

[R CC tomo · tomo slice 21/42.0]
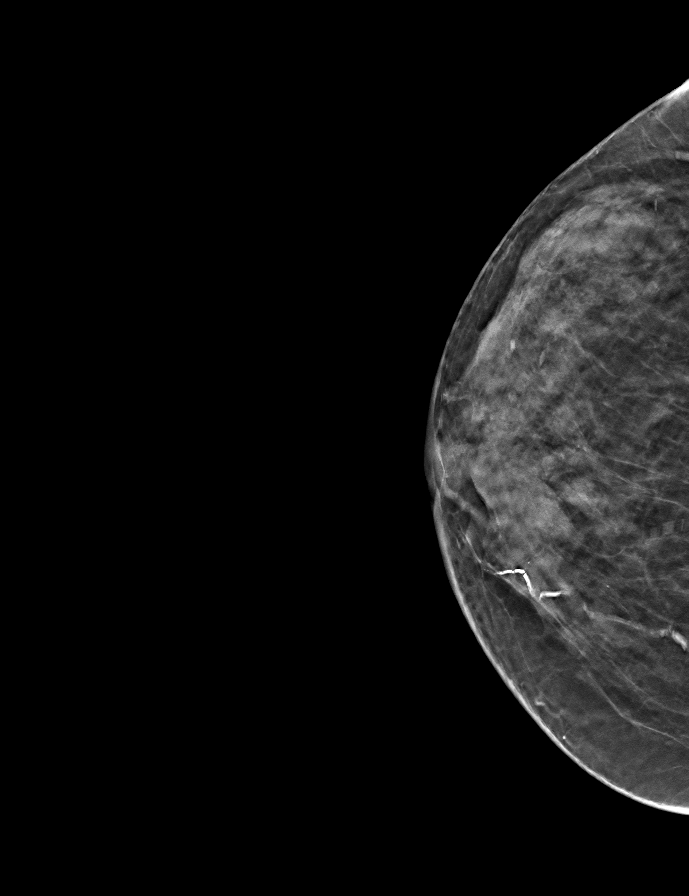

[9 of 24 positions shown; findings below may reference images not displayed]

ACR Breast Density Category c: The breast tissue is heterogeneously
dense, which may obscure small masses.
FINDINGS: In the left breast, a possible mass warrants further evaluation. In
the right breast, no findings suspicious for malignancy.

Images were processed with CAD.
IMPRESSION: Further evaluation is suggested for possible mass in the left
breast.

RECOMMENDATION:
Diagnostic mammogram and possibly ultrasound of the left breast.
(Code:[R2])

The patient will be contacted regarding the findings, and additional
imaging will be scheduled.

BI-RADS CATEGORY  0: Incomplete. Need additional imaging evaluation
and/or prior mammograms for comparison.

## 2018-12-07 ENCOUNTER — Encounter: Payer: Self-pay | Admitting: Hematology and Oncology

## 2018-12-08 ENCOUNTER — Other Ambulatory Visit: Payer: Self-pay | Admitting: Family Medicine

## 2018-12-08 DIAGNOSIS — N6320 Unspecified lump in the left breast, unspecified quadrant: Secondary | ICD-10-CM

## 2018-12-08 DIAGNOSIS — R928 Other abnormal and inconclusive findings on diagnostic imaging of breast: Secondary | ICD-10-CM

## 2018-12-15 ENCOUNTER — Ambulatory Visit
Admission: RE | Admit: 2018-12-15 | Discharge: 2018-12-15 | Disposition: A | Discharge status: Home / Self Care | Payer: Medicare Other | Source: Ambulatory Visit | Attending: Family Medicine | Admitting: Family Medicine

## 2018-12-15 DIAGNOSIS — R928 Other abnormal and inconclusive findings on diagnostic imaging of breast: Secondary | ICD-10-CM | POA: Diagnosis not present

## 2018-12-15 DIAGNOSIS — N6320 Unspecified lump in the left breast, unspecified quadrant: Secondary | ICD-10-CM

## 2018-12-15 IMAGING — MG MM DIGITAL DIAGNOSTIC UNILAT*L* W/ TOMO W/ CAD
4 series · 4 of 12 positions shown · non-contrast
Comparison: Previous exam(s).

CLINICAL DATA: Screening recall for possible left breast mass.

EXAM:
DIGITAL DIAGNOSTIC LEFT MAMMOGRAM WITH CAD AND TOMO
ULTRASOUND LEFT BREAST

[L MLO synth-2D]
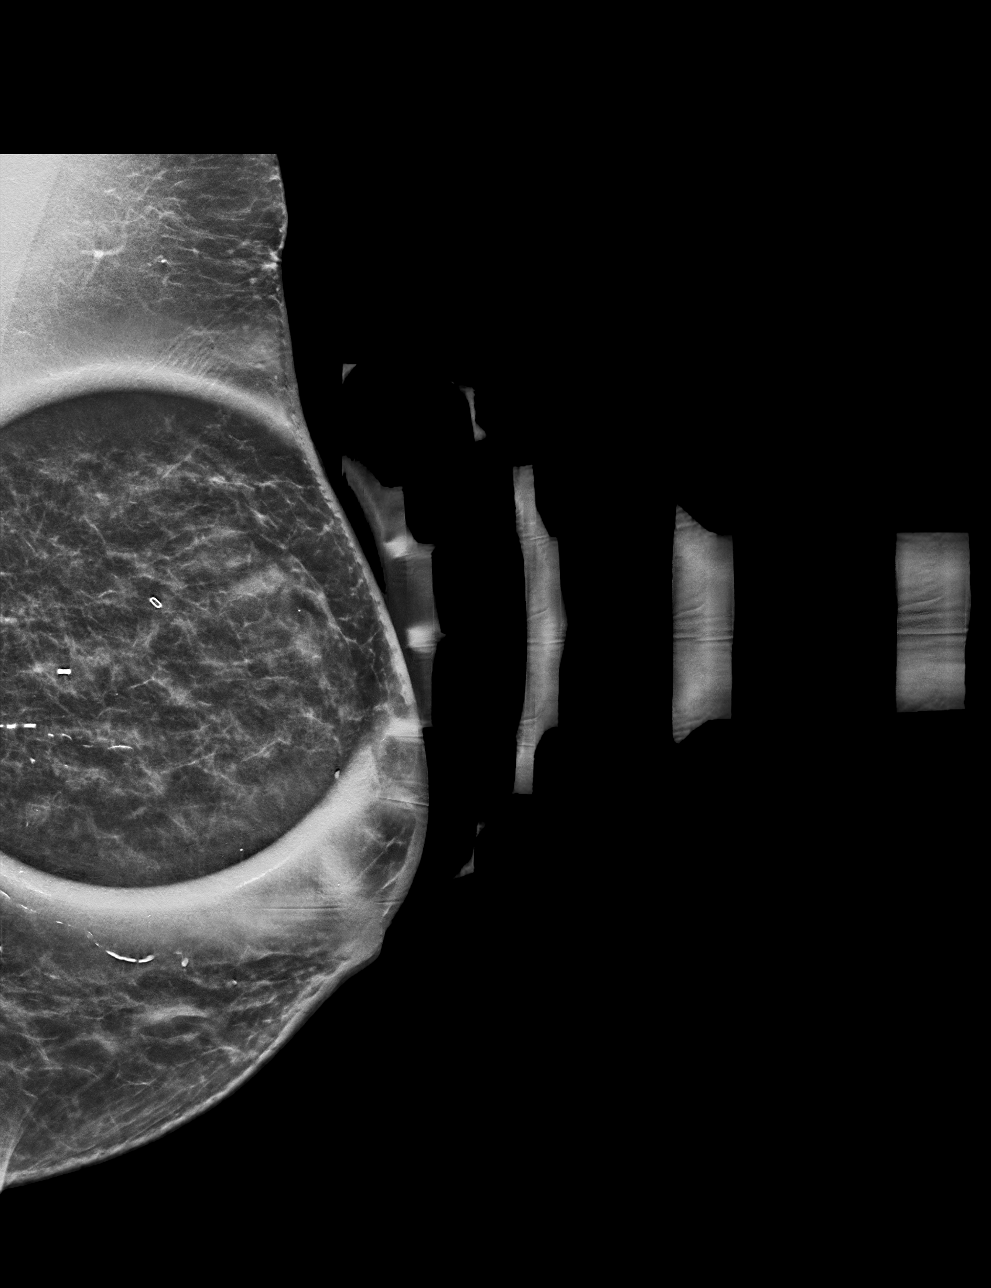

[L CC synth-2D]
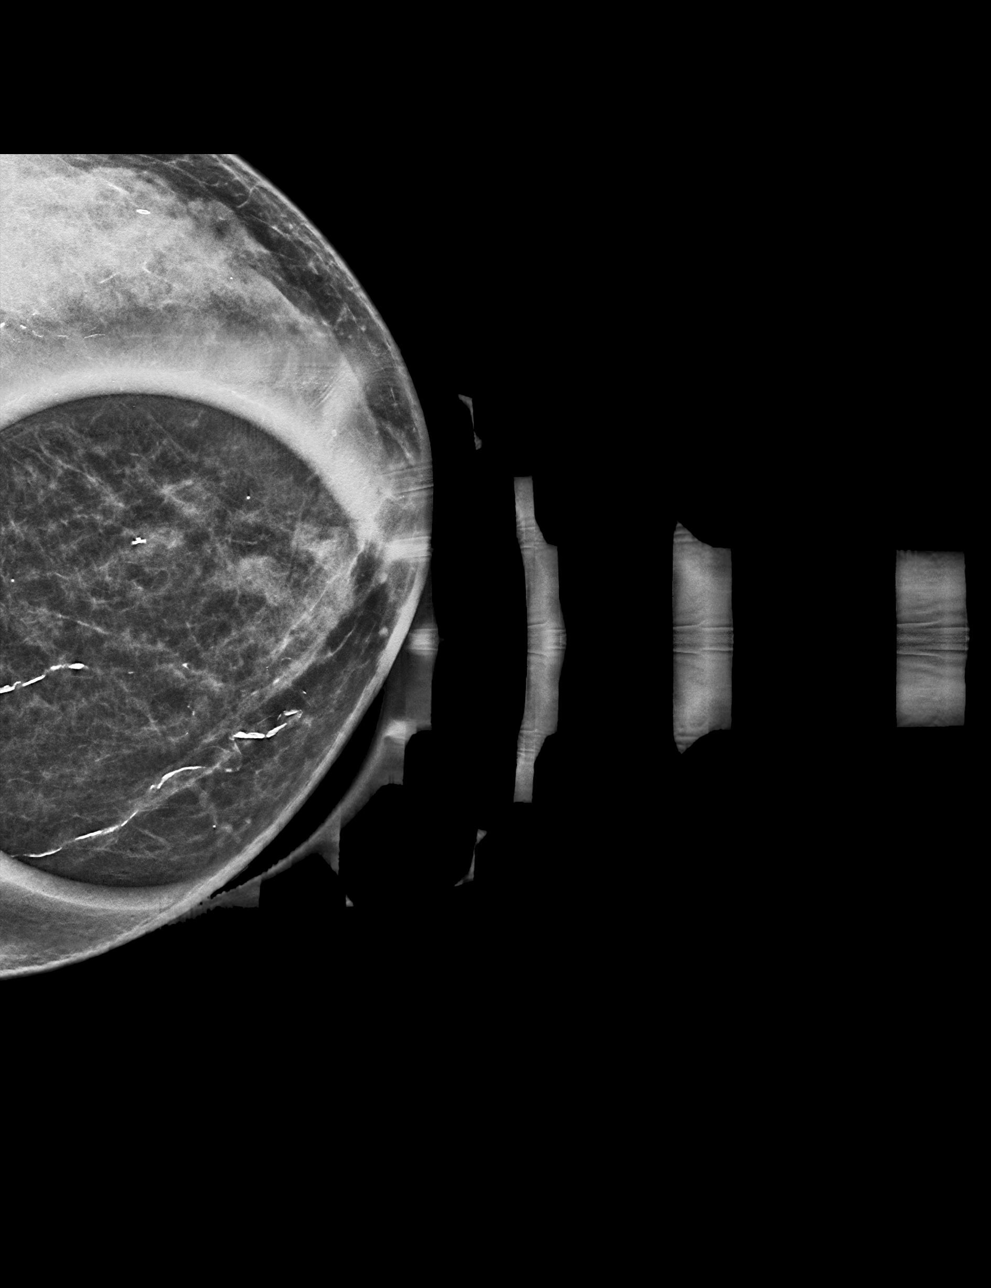

[L CC tomo · tomo slice 23/46.0]
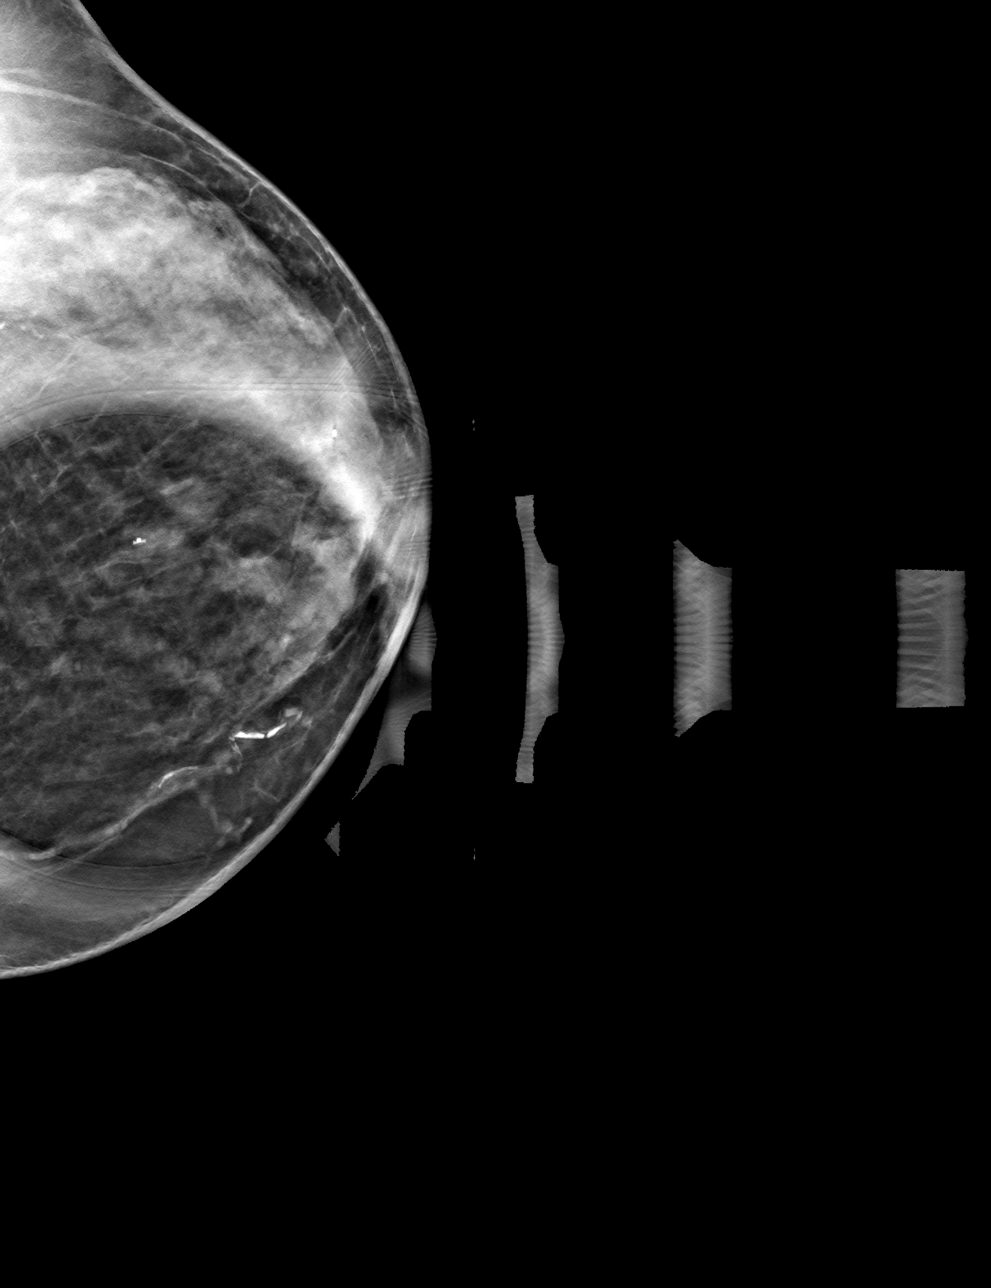

[L MLO tomo · tomo slice 25/48.0]
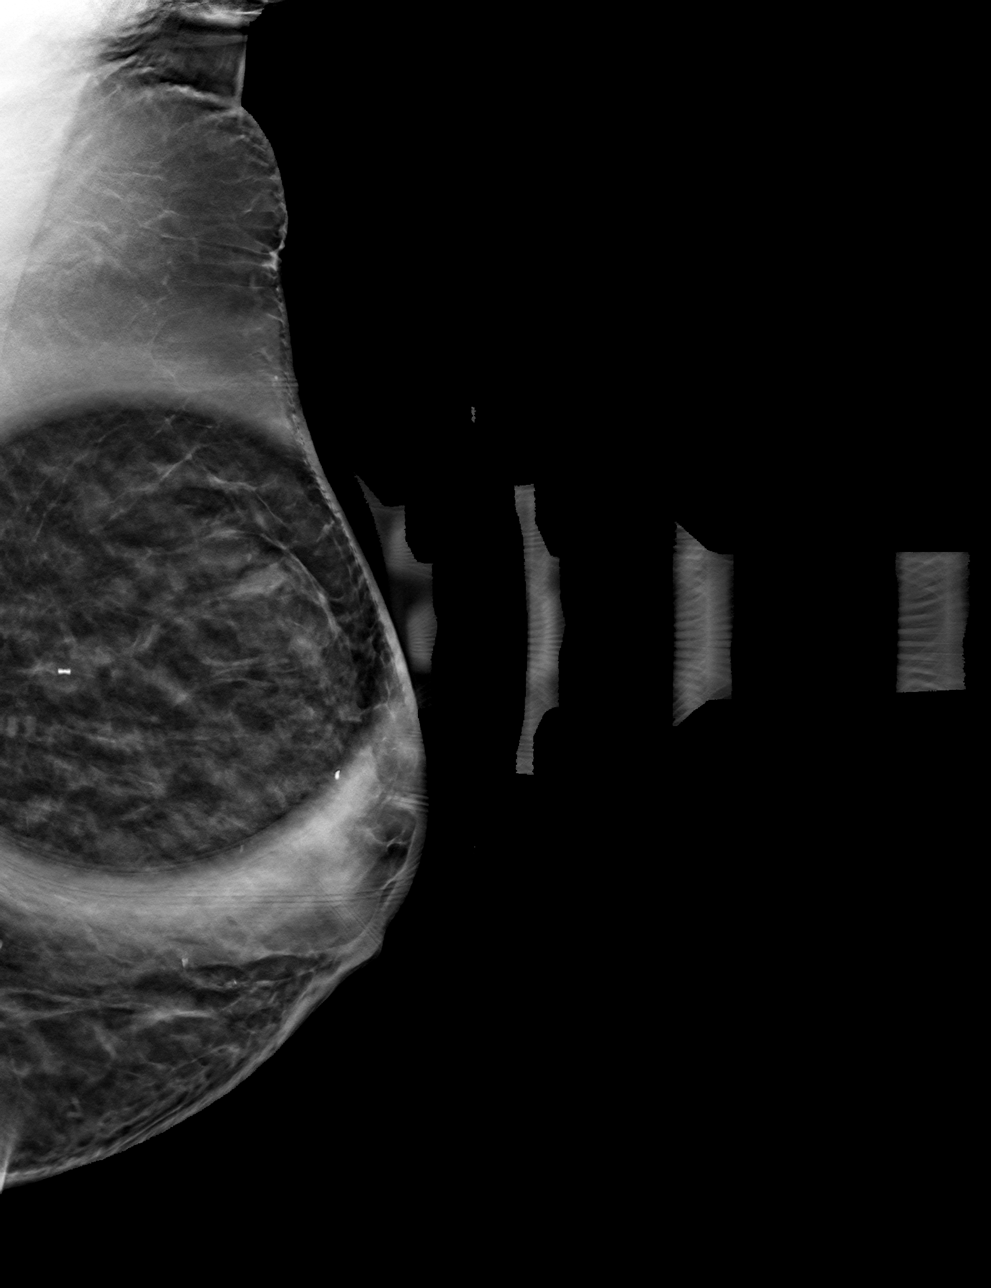

[4 of 12 positions shown; findings below may reference images not displayed]

ACR Breast Density Category c: The breast tissue is heterogeneously
dense, which may obscure small masses.
FINDINGS: On the diagnostic spot-compression images, the possible mass noted
in the upper, slightly inner aspect of the left breast, mostly
disperses on the CC view and more completely disperses on the spot
compression MLO view. There is a focal area of density more
posteriorly associated with a biopsy clip from a previous benign
biopsy.

There are no areas of architectural distortion. There are no
suspicious calcifications.

Mammographic images were processed with CAD.

On physical exam, no mass is palpated in the upper inner left
breast.

Targeted ultrasound is performed, showing heterogeneous
fibroglandular tissue in the upper and upper inner left breast.
There is a more discrete area of of heterogeneous echogenicity with
some shadowing at 11 o'clock, 7 cm the nipple, but with no defined
margin, likely due to focal fibroglandular tissue. Imaging of the
left axilla shows no enlarged or abnormal lymph nodes.
IMPRESSION: 1. Probably benign area of asymmetric, prominent fibroglandular
tissue in the upper slightly inner aspect of the left breast. A
similar area slightly more posterior to the current finding has been
biopsied previously with benign results. Recommend short-term
follow-up.

RECOMMENDATION:
Diagnostic left breast mammography with possible ultrasound in 6
months.

I have discussed the findings and recommendations with the patient.
If applicable, a reminder letter will be sent to the patient
regarding the next appointment.

BI-RADS CATEGORY  3: Probably benign.

## 2018-12-15 IMAGING — US US BREAST*L* LIMITED INC AXILLA
1 series · 6 of 6 positions shown · non-contrast
Comparison: Previous exam(s).

CLINICAL DATA: Screening recall for possible left breast mass.

EXAM:
DIGITAL DIAGNOSTIC LEFT MAMMOGRAM WITH CAD AND TOMO
ULTRASOUND LEFT BREAST

[Series 1: us breast*left* limited inc axilla · 0.06mm/px · 6 of 6 slices shown]
[im 1/6]
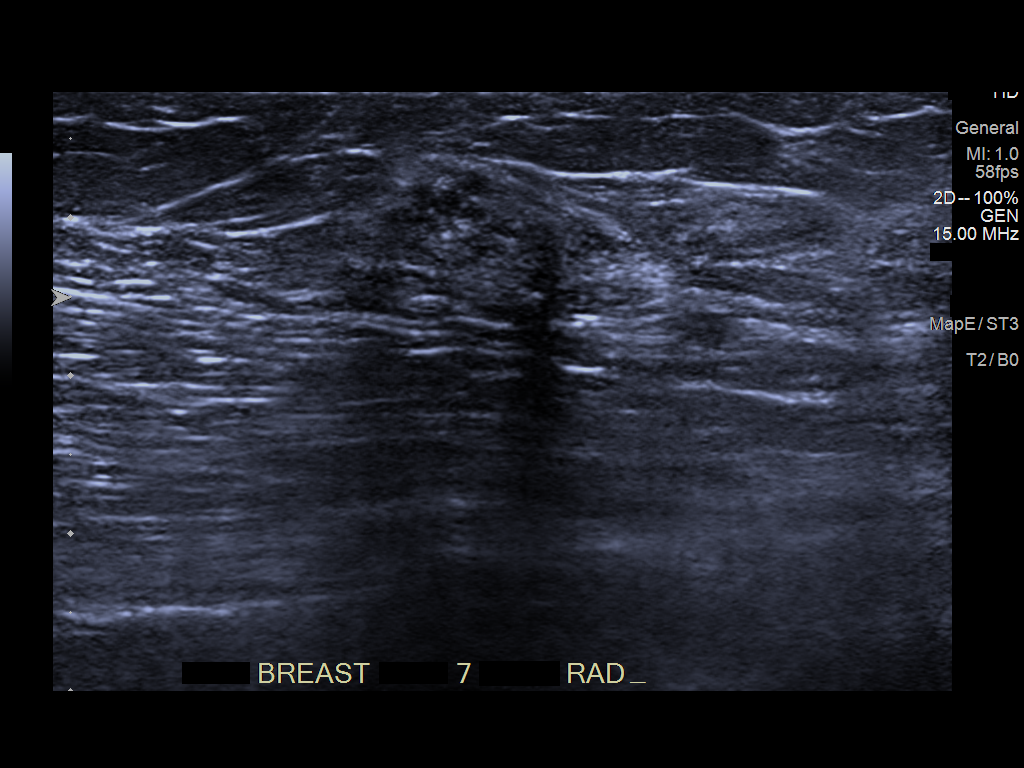
[im 2/6]
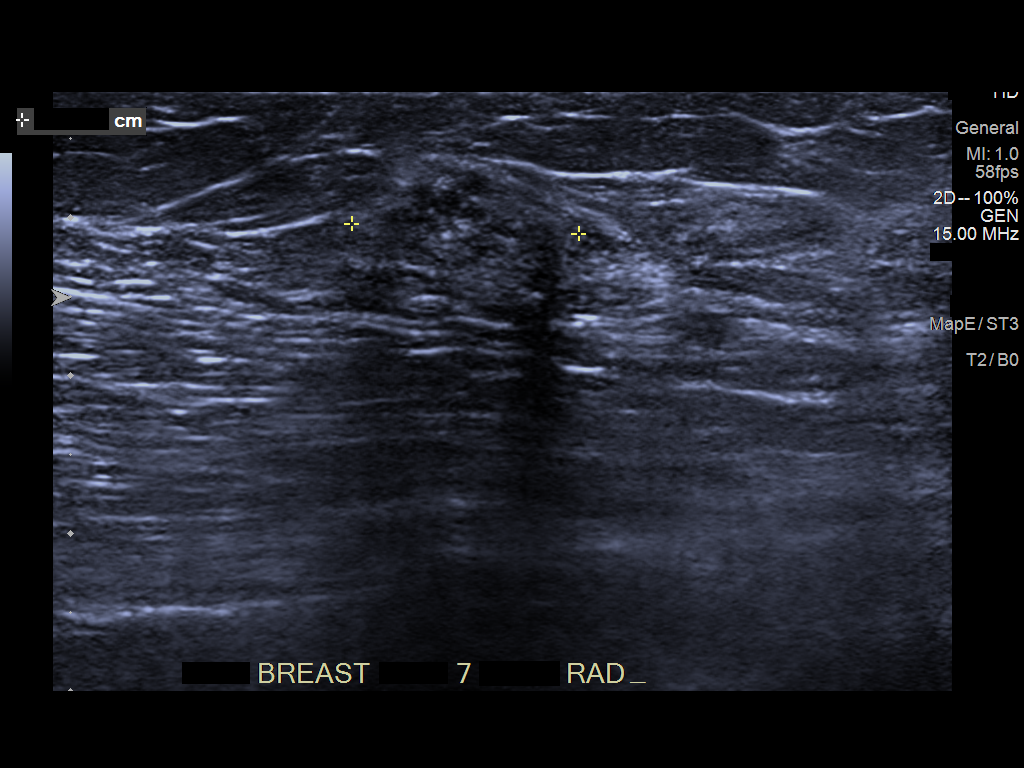
[im 3/6]
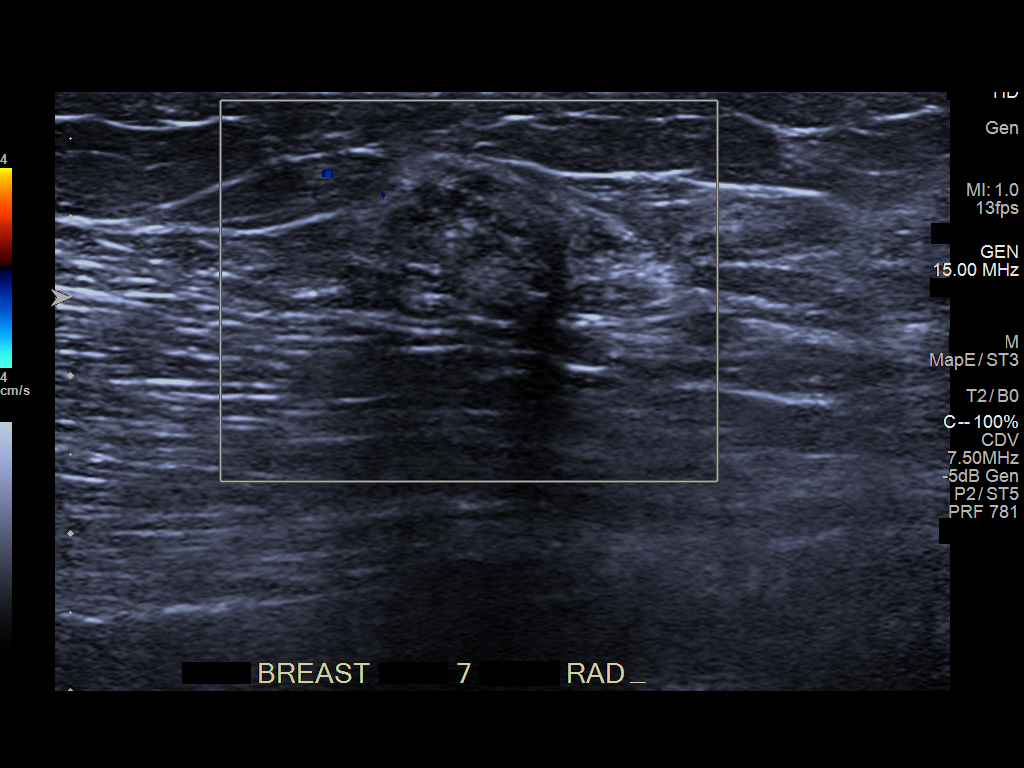
[im 4/6]
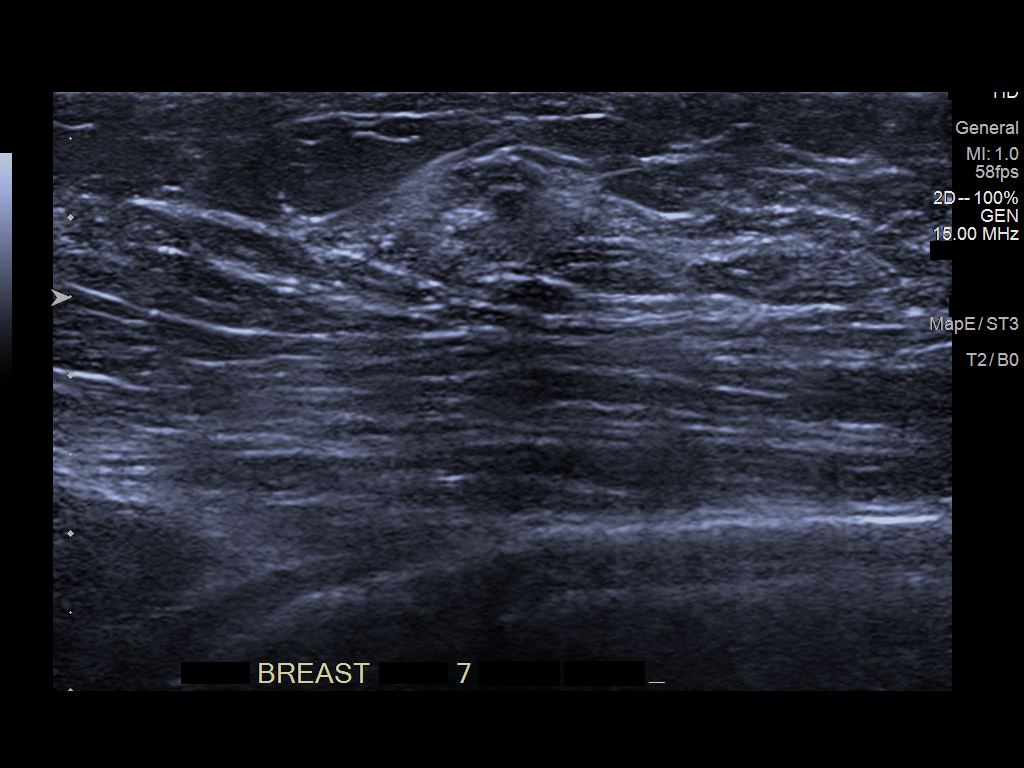
[im 5/6]
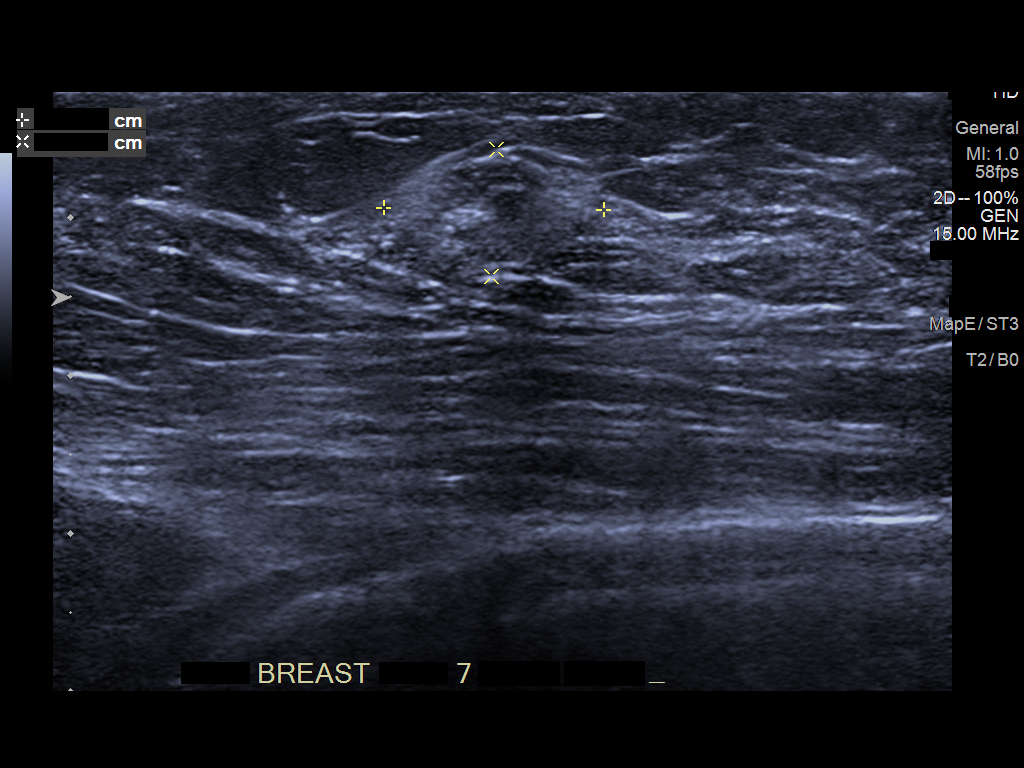
[im 6/6]
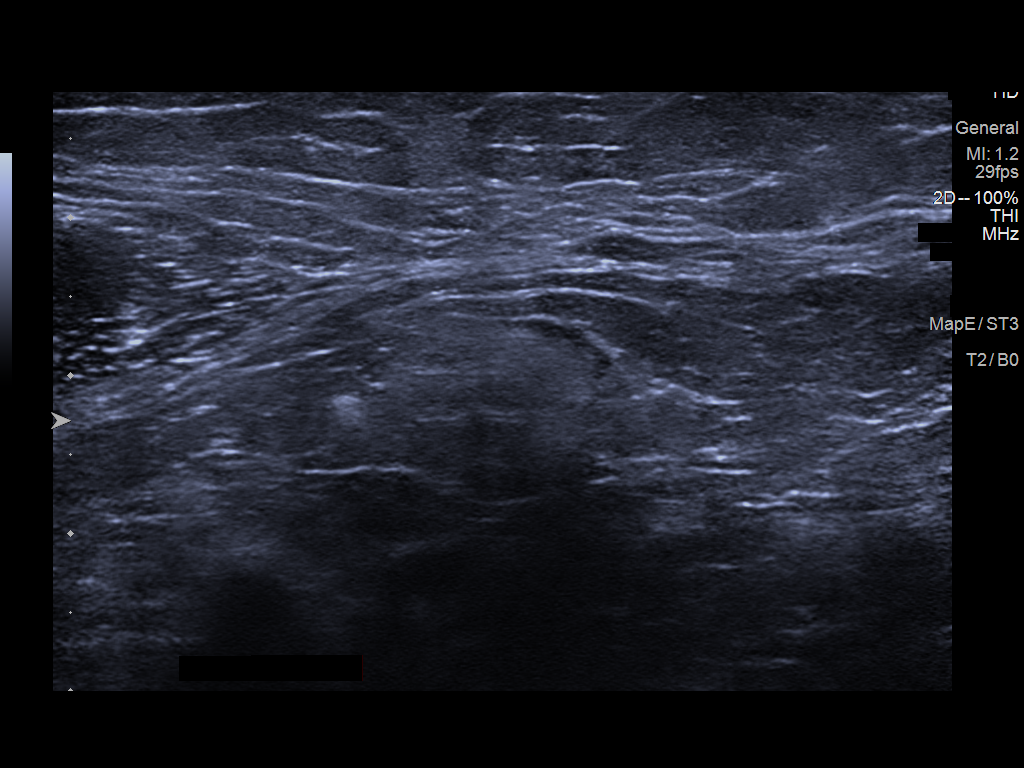

[6 of 6 positions shown; findings below may reference images not displayed]

ACR Breast Density Category c: The breast tissue is heterogeneously
dense, which may obscure small masses.
FINDINGS: On the diagnostic spot-compression images, the possible mass noted
in the upper, slightly inner aspect of the left breast, mostly
disperses on the CC view and more completely disperses on the spot
compression MLO view. There is a focal area of density more
posteriorly associated with a biopsy clip from a previous benign
biopsy.

There are no areas of architectural distortion. There are no
suspicious calcifications.

Mammographic images were processed with CAD.

On physical exam, no mass is palpated in the upper inner left
breast.

Targeted ultrasound is performed, showing heterogeneous
fibroglandular tissue in the upper and upper inner left breast.
There is a more discrete area of of heterogeneous echogenicity with
some shadowing at 11 o'clock, 7 cm the nipple, but with no defined
margin, likely due to focal fibroglandular tissue. Imaging of the
left axilla shows no enlarged or abnormal lymph nodes.
IMPRESSION: 1. Probably benign area of asymmetric, prominent fibroglandular
tissue in the upper slightly inner aspect of the left breast. A
similar area slightly more posterior to the current finding has been
biopsied previously with benign results. Recommend short-term
follow-up.

RECOMMENDATION:
Diagnostic left breast mammography with possible ultrasound in 6
months.

I have discussed the findings and recommendations with the patient.
If applicable, a reminder letter will be sent to the patient
regarding the next appointment.

BI-RADS CATEGORY  3: Probably benign.

## 2018-12-16 ENCOUNTER — Inpatient Hospital Stay: Payer: Medicare Other

## 2018-12-16 ENCOUNTER — Other Ambulatory Visit: Payer: Self-pay

## 2018-12-16 ENCOUNTER — Inpatient Hospital Stay: Payer: Medicare Other | Attending: Hematology and Oncology

## 2018-12-16 ENCOUNTER — Other Ambulatory Visit: Payer: Self-pay | Admitting: Hematology and Oncology

## 2018-12-16 ENCOUNTER — Other Ambulatory Visit: Payer: Self-pay | Admitting: Family Medicine

## 2018-12-16 VITALS — BP 140/64 | HR 68 | Temp 97.4°F | Resp 18

## 2018-12-16 DIAGNOSIS — Z79899 Other long term (current) drug therapy: Secondary | ICD-10-CM | POA: Diagnosis not present

## 2018-12-16 DIAGNOSIS — M2550 Pain in unspecified joint: Secondary | ICD-10-CM | POA: Insufficient documentation

## 2018-12-16 DIAGNOSIS — D631 Anemia in chronic kidney disease: Secondary | ICD-10-CM

## 2018-12-16 DIAGNOSIS — D472 Monoclonal gammopathy: Secondary | ICD-10-CM | POA: Diagnosis not present

## 2018-12-16 DIAGNOSIS — Z85048 Personal history of other malignant neoplasm of rectum, rectosigmoid junction, and anus: Secondary | ICD-10-CM | POA: Insufficient documentation

## 2018-12-16 DIAGNOSIS — N6489 Other specified disorders of breast: Secondary | ICD-10-CM

## 2018-12-16 DIAGNOSIS — N184 Chronic kidney disease, stage 4 (severe): Secondary | ICD-10-CM | POA: Insufficient documentation

## 2018-12-16 DIAGNOSIS — R5383 Other fatigue: Secondary | ICD-10-CM | POA: Insufficient documentation

## 2018-12-16 LAB — HEMOGLOBIN AND HEMATOCRIT, BLOOD
HCT: 28.4 % — ABNORMAL LOW (ref 36.0–46.0)
Hemoglobin: 9.3 g/dL — ABNORMAL LOW (ref 12.0–15.0)

## 2018-12-16 MED ORDER — DARBEPOETIN ALFA 150 MCG/0.3ML IJ SOSY
100.0000 ug | PREFILLED_SYRINGE | Freq: Once | INTRAMUSCULAR | Status: AC
  Administered 2018-12-16: 100 ug via SUBCUTANEOUS
  Filled 2018-12-16: qty 0.3

## 2019-01-11 NOTE — Progress Notes (Signed)
[facility]  [address] [place], [place] [address] Phone: [phone]  Fax: [phone]   Clinic Day:  01/13/2019  Referring physician: Gayland Curry, MD  Chief Complaint: Misty [name] is a 78 y.o. female with anemia of chronic kidney disease who is seen for 3 month assessment.  HPI: The patient was last seen in the medical oncology clinic on 10/21/2018. At that time, she was doing well. Hematocrit 33.3. Hemoglobin 11.0. M spike was 0. I discussed plan to initiate Retacrit or Aranesp when hemoglobin < 10.   Bone density on 11/23/2018 revealed osteopenia with a T-score of -2.1 at the left neck femur.   Bilateral mammogram on 11/23/2018 revealed a possible mass in the left breast.  Left breast mammogram and ultrasound on 12/15/2018 revealed a probable benign area of asymmetric, prominent fibroglandular tissue in the upper slightly inner aspect of the left breast. A similar area slightly more posterior to the current finding had been biopsied previously with benign results. Diagnostic left mammogram and possible ultrasound was recommended in 6 months.  Labs followed: 11/18/2018: Hematocrit 30.2. Hemoglobin 10.0. 12/16/2018: Hematocrit 28.4. Hemoglobin 9.3. She received Aranesp 100 mcg.    During the interim, she has felt good. She notes feeling much better since receiving  Aranesp on 12/16/2018. She has bilateral hip pain. She has diarrhea and is on Imodium.    Past Medical History:  Diagnosis Date   Anemia    Cancer ([place]) 2011   rectal ca/chemo /rad /surgery   Chronic kidney disease    Diabetes mellitus without complication ([place])    type 2   Hypertension    Personal history of chemotherapy    Personal history of radiation therapy     Past Surgical History:  Procedure Laterality Date   BREAST BIOPSY Left    bx/ clip-neg   coloretcal cancer     thyroid gland     surgery / unknown date    Family History  Problem  Relation Age of Onset   Breast cancer Neg Hx     Social History:  reports that she has never smoked. She has never used smokeless tobacco. She reports that she does not drink alcohol. No history on file for drug. She is originally from [place] but moved to [place] in 1972.[place].She denies any alcohol or tobacco use. She denies any exposure to radiation or toxins. 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  Allergies:  Allergies  Allergen Reactions   Penicillins Rash    Current Medications: Current Outpatient Medications  Medication Sig Dispense Refill   acetaminophen (TYLENOL) 325 MG tablet Take 325 mg by mouth every 4 (four) hours as needed.      atorvastatin (LIPITOR) 40 MG tablet Take 40 mg by mouth daily at 6 PM.      B Complex-C-Folic Acid (RENAL) 1 MG CAPS TK 1 C PO D     carvedilol (COREG) 3.125 MG tablet Take 3.125 mg by mouth 2 (two) times daily with a meal.      Cetirizine HCl 10 MG CAPS Take 1 capsule by mouth daily.      clopidogrel (PLAVIX) 75 MG tablet Take 75 mg by mouth daily.      colestipol (COLESTID) 1 g tablet Take 1 g by mouth 2 (two) times daily.      ferrous sulfate 325 (65 FE) MG tablet  Take 325 mg by mouth 3 (three) times a week.      Fiber Diet TABS Take 2 tablets by mouth 2 (two) times daily.      gabapentin (NEURONTIN) 300 MG capsule Take 300 mg by mouth 2 (two) times daily.      glimepiride (AMARYL) 2 MG tablet Take 2 mg by mouth daily with breakfast.      glucose blood test strip 1 each by Finger Stick route 4 times daily (after meals and nightly). Use as directed     HUMALOG 100 UNIT/ML injection Inject into the skin as directed. Sliding Scale     hydrALAZINE (APRESOLINE) 25 MG tablet Take 25 mg by mouth daily.      insulin glargine (LANTUS) 100 UNIT/ML injection Inject 35 Units into the skin 2 (two) times  daily.      INSULIN SYRINGE 1CC/29G 29G X 1/2" 1 ML MISC      latanoprost (XALATAN) 0.005 % ophthalmic solution Place 1 drop into the left eye at bedtime.      lisinopril (ZESTRIL) 10 MG tablet Take 10 mg by mouth daily.      NIFEdipine (ADALAT CC) 60 MG 24 hr tablet Take 60 mg by mouth daily.      sodium bicarbonate 650 MG tablet Take 650 mg by mouth daily.      SURE COMFORT PEN NEEDLES 31G X 8 MM MISC      No current facility-administered medications for this visit.     Review of Systems  Constitutional: Negative.  Negative for chills, diaphoresis, fever, malaise/fatigue and weight loss (up 2 lbs).       Feels good.  HENT: Negative.  Negative for congestion, ear pain, hearing loss, nosebleeds, sinus pain, sore throat and tinnitus.   Eyes: Negative.  Negative for blurred vision and double vision.  Respiratory: Negative.  Negative for cough, hemoptysis, sputum production and shortness of breath.   Cardiovascular: Negative.  Negative for chest pain, palpitations and leg swelling.  Gastrointestinal: Positive for diarrhea (chronic; on Imodium). Negative for abdominal pain, blood in stool, constipation, melena, nausea and vomiting.  Genitourinary: Negative for dysuria, frequency, hematuria and urgency.       Prolapsed bladder.  Musculoskeletal: Positive for joint pain (hips). Negative for back pain, myalgias and neck pain.  Skin: Negative.  Negative for itching and rash.  Neurological: Negative.  Negative for dizziness, tingling, sensory change, focal weakness, weakness and headaches.  Endo/Heme/Allergies: Negative.  Does not bruise/bleed easily.  Psychiatric/Behavioral: Negative.  Negative for depression and memory loss. The patient is not nervous/anxious and does not have insomnia.   All other systems reviewed and are negative.  Performance status (ECOG): 1  Vitals Blood pressure (!) 152/65, pulse 79, temperature 97.9 F (36.6 C), temperature source Tympanic, resp. rate 18, height  '5\' 3"'$  (1.6 m), weight 137 lb 2 oz (62.2 kg), SpO2 99 %.   Physical Exam  Constitutional: She is oriented to person, place, and time. She appears well-developed and well-nourished. No distress.  HENT:  Head: Normocephalic and atraumatic.  Mouth/Throat: Oropharynx is clear and moist. No oropharyngeal exudate.  Short blonde hair.  Mask.  Eyes: Pupils are equal, round, and reactive to light. Conjunctivae and EOM are normal. No scleral icterus.  Glasses.  Blue eyes.  Neck: Normal range of motion. Neck supple. No JVD present.  Cardiovascular: Normal rate, regular rhythm and normal heart sounds.  No murmur heard. Pulmonary/Chest: Effort normal and breath sounds normal. No respiratory distress. She has no wheezes.  She has no rales. She exhibits no tenderness.  Abdominal: Soft. Bowel sounds are normal. She exhibits no distension and no mass. There is no abdominal tenderness. There is no rebound and no guarding.  Musculoskeletal: Normal range of motion.        General: No tenderness or edema.  Lymphadenopathy:    She has no cervical adenopathy.    She has no axillary adenopathy.       Right: No supraclavicular adenopathy present.       Left: No supraclavicular adenopathy present.  Neurological: She is alert and oriented to person, place, and time.  Skin: Skin is warm and dry. No rash noted. She is not diaphoretic. No erythema. No pallor.  Psychiatric: She has a normal mood and affect. Her behavior is normal. Judgment and thought content normal.  Nursing note and vitals reviewed.   Appointment on 01/13/2019  Component Date Value Ref Range Status   Hemoglobin 01/13/2019 10.2* 12.0 - 15.0 g/dL Final   HCT 01/13/2019 32.0* 36.0 - 46.0 % Final   Performed at [facility], [address], [place], [place] [address]    Assessment:  Misty [name] is a 78 y.o. female with anemia of chronic renal disease.  She has chronic renal insufficiency.Creatinine was 2.7 (creatinine  clearance 17 ml/min) on 09/07/2018.  Work-up on 10/15/2018 revealed a hematocrit 33.3, hemoglobin 11.0, MCV 88.6, platelets 304,000, and WBC 7,400. Differential was normal.  Ferritin was 135 with an iron saturation of 26% and a TIBC 315.  Vitamin B-12 was 511.  Folate >100.0. SPEP revealed no M-spike. Kappa free light chains 110.2, lambda free light chains 38.5. and ratio 2.86 (0.26-1.65).  24 hour UPEP revealed 1384 mg/24 hours of protein without a monoclonal spike.  She received Aranesp 100 mcg on 12/16/2018.  She has amonoclonal gammopathy or unknown significance(MGUS).  Bone marrow biopsyon 02/04/2017 was sub-optimal.Congo red stains and abdominal wall fat pad aspirationwere negative.She was briefly treated with Velcade and Decadron beginning in 08/12/2017. Treatment was complicated by hyperglycemia and diarrhea. 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 There was very variably cellular marrow, overall 50% with trilineage hematopoiesis. There were abnormal plasma cells present at normal plasma cell background detected by flow cytometry. Findings wereconsistent with a low level of involvement of bone marrow with the patient's previously diagnosed plasma cell neoplasm.  24-hour UPEPon 12/28/2017 revealed 1956m/24hrsprotein with no M-spike and normal immunofixation. Kappa free light chains were 101, lambda free light chain 6.39 and a ratio of 15.81 (2.04 - 10.37).Whole body MRIon 02/04/2018 revealed no evidence of discrete focal dominant marrow lesion.   She has a history of rectal cancers/p neoadjuvant chemotherapy followed by resection on 08/08/2009. Transrectalultrasound in 01/2011confirmed a 4 cm mass from the anal verge, encompassing 1/3 of the circumference of the rectum.Clinical stagewas T3N0. She received3 cycles of CI 5FU.There was noresidual carcinomaon resection. She underwent a  restorative proctectomy with colonic J-pouch and coloanal anastomosis. Two lymph nodes werenegative. She declined adjuvant chemotherapy.Colonoscopyin 03/2017 showed no evidence of recurrence. CEA was 1.5 on 10/15/2018.  Symptomatically, she feels good.  Exam is unremarkable.  Creatinine is 3.32.  Plan: 1.  Labs today: CBC with diff, ferritin. 2.  Anemia of chronic kidney disease Clinically, she is doing well.             Hematocrit 32.0.  Hemoglobin 10.2.    She received Aranesp on 12/16/2018.  Continue Aranesp when hemoglobin < 10. 3.   Monoclonal gammopathy or unknown significance (MGUS) M-spike was 0 on 10/15/2018.             Free light chains showed a slight excess of kappa free light chains.             Continue to monitor every 6 months. 4.   Rectal cancer (2011) She continues to do well. 5.   No Aranesp today.    6.   RTC in 2 weeks for labs (Hgb/HCT, ferritin), and +/- Aranesp. 7.   RTC in 8 weeks for labs (Hgb/HCT), and +/- Aranesp. 8.   RTC in 14 weeks for MD assess, labs (CBC with diff, Cr, ferritin), and +/- Aranesp.  I discussed the assessment and treatment plan with the patient.  The patient was provided an opportunity to ask questions and all were answered.  The patient agreed with the plan and demonstrated an understanding of the instructions.  The patient was advised to call back if the symptoms worsen or if the condition fails to improve as anticipated.  I provided 14 minutes of face-to-face time during this this encounter and > 50% was spent counseling as documented under my assessment and plan.    Lequita Asal, MD, PhD    01/13/2019, 2:47 PM  I, Selena Batten, am acting as scribe for [employer]. Mike Gip, MD, PhD.  I, Braidyn Peace C. Mike Gip, MD, have reviewed the above documentation for accuracy and completeness, and I agree with the above.

## 2019-01-13 ENCOUNTER — Other Ambulatory Visit: Payer: Self-pay

## 2019-01-13 ENCOUNTER — Inpatient Hospital Stay: Payer: Medicare Other

## 2019-01-13 ENCOUNTER — Inpatient Hospital Stay: Payer: Medicare Other | Attending: Hematology and Oncology | Admitting: Hematology and Oncology

## 2019-01-13 ENCOUNTER — Encounter: Payer: Self-pay | Admitting: Hematology and Oncology

## 2019-01-13 VITALS — BP 152/65 | HR 79 | Temp 97.9°F | Resp 18 | Ht 63.0 in | Wt 137.1 lb

## 2019-01-13 DIAGNOSIS — C20 Malignant neoplasm of rectum: Secondary | ICD-10-CM | POA: Insufficient documentation

## 2019-01-13 DIAGNOSIS — Z79899 Other long term (current) drug therapy: Secondary | ICD-10-CM | POA: Insufficient documentation

## 2019-01-13 DIAGNOSIS — M8580 Other specified disorders of bone density and structure, unspecified site: Secondary | ICD-10-CM | POA: Insufficient documentation

## 2019-01-13 DIAGNOSIS — Z923 Personal history of irradiation: Secondary | ICD-10-CM | POA: Insufficient documentation

## 2019-01-13 DIAGNOSIS — D472 Monoclonal gammopathy: Secondary | ICD-10-CM | POA: Insufficient documentation

## 2019-01-13 DIAGNOSIS — Z9221 Personal history of antineoplastic chemotherapy: Secondary | ICD-10-CM | POA: Insufficient documentation

## 2019-01-13 DIAGNOSIS — N6489 Other specified disorders of breast: Secondary | ICD-10-CM

## 2019-01-13 DIAGNOSIS — R197 Diarrhea, unspecified: Secondary | ICD-10-CM | POA: Insufficient documentation

## 2019-01-13 DIAGNOSIS — Z85048 Personal history of other malignant neoplasm of rectum, rectosigmoid junction, and anus: Secondary | ICD-10-CM | POA: Insufficient documentation

## 2019-01-13 DIAGNOSIS — D631 Anemia in chronic kidney disease: Secondary | ICD-10-CM | POA: Diagnosis not present

## 2019-01-13 DIAGNOSIS — R739 Hyperglycemia, unspecified: Secondary | ICD-10-CM | POA: Diagnosis not present

## 2019-01-13 DIAGNOSIS — M25552 Pain in left hip: Secondary | ICD-10-CM | POA: Diagnosis not present

## 2019-01-13 DIAGNOSIS — Z880 Allergy status to penicillin: Secondary | ICD-10-CM | POA: Insufficient documentation

## 2019-01-13 DIAGNOSIS — M25551 Pain in right hip: Secondary | ICD-10-CM | POA: Diagnosis not present

## 2019-01-13 DIAGNOSIS — N184 Chronic kidney disease, stage 4 (severe): Secondary | ICD-10-CM

## 2019-01-13 LAB — HEMOGLOBIN AND HEMATOCRIT, BLOOD
HCT: 32 % — ABNORMAL LOW (ref 36.0–46.0)
Hemoglobin: 10.2 g/dL — ABNORMAL LOW (ref 12.0–15.0)

## 2019-01-13 LAB — COMPREHENSIVE METABOLIC PANEL
ALT: 26 U/L (ref 0–44)
AST: 25 U/L (ref 15–41)
Albumin: 3.9 g/dL (ref 3.5–5.0)
Alkaline Phosphatase: 41 U/L (ref 38–126)
Anion gap: 11 (ref 5–15)
BUN: 42 mg/dL — ABNORMAL HIGH (ref 8–23)
CO2: 26 mmol/L (ref 22–32)
Calcium: 9.8 mg/dL (ref 8.9–10.3)
Chloride: 105 mmol/L (ref 98–111)
Creatinine, Ser: 3.32 mg/dL — ABNORMAL HIGH (ref 0.44–1.00)
GFR calc Af Amer: 15 mL/min — ABNORMAL LOW (ref >60)
GFR calc non Af Amer: 13 mL/min — ABNORMAL LOW (ref >60)
Glucose, Bld: 131 mg/dL — ABNORMAL HIGH (ref 70–99)
Potassium: 4.5 mmol/L (ref 3.5–5.1)
Sodium: 142 mmol/L (ref 135–145)
Total Bilirubin: 0.7 mg/dL (ref 0.3–1.2)
Total Protein: 7.2 g/dL (ref 6.5–8.1)

## 2019-01-27 ENCOUNTER — Inpatient Hospital Stay: Payer: Medicare Other

## 2019-01-27 ENCOUNTER — Other Ambulatory Visit: Payer: Self-pay

## 2019-01-27 VITALS — BP 150/66 | HR 71 | Temp 97.8°F | Resp 18

## 2019-01-27 DIAGNOSIS — D631 Anemia in chronic kidney disease: Secondary | ICD-10-CM

## 2019-01-27 DIAGNOSIS — N184 Chronic kidney disease, stage 4 (severe): Secondary | ICD-10-CM | POA: Diagnosis not present

## 2019-01-27 LAB — HEMOGLOBIN AND HEMATOCRIT, BLOOD
HCT: 30.8 % — ABNORMAL LOW (ref 36.0–46.0)
Hemoglobin: 10 g/dL — ABNORMAL LOW (ref 12.0–15.0)

## 2019-01-27 LAB — FERRITIN: Ferritin: 104 ng/mL (ref 11–307)

## 2019-01-27 MED ORDER — DARBEPOETIN ALFA 100 MCG/0.5ML IJ SOSY
PREFILLED_SYRINGE | INTRAMUSCULAR | Status: AC
  Filled 2019-01-27: qty 0.5

## 2019-01-27 MED ORDER — DARBEPOETIN ALFA 100 MCG/0.5ML IJ SOSY
100.0000 ug | PREFILLED_SYRINGE | Freq: Once | INTRAMUSCULAR | Status: AC
  Administered 2019-01-27: 100 ug via SUBCUTANEOUS

## 2019-03-09 ENCOUNTER — Other Ambulatory Visit: Payer: Self-pay

## 2019-03-10 ENCOUNTER — Inpatient Hospital Stay: Payer: Medicare Other

## 2019-03-10 ENCOUNTER — Inpatient Hospital Stay: Payer: Medicare Other | Attending: Hematology and Oncology

## 2019-03-10 DIAGNOSIS — N184 Chronic kidney disease, stage 4 (severe): Secondary | ICD-10-CM | POA: Insufficient documentation

## 2019-03-10 DIAGNOSIS — D631 Anemia in chronic kidney disease: Secondary | ICD-10-CM | POA: Diagnosis present

## 2019-03-10 LAB — HEMOGLOBIN AND HEMATOCRIT, BLOOD
HCT: 33 % — ABNORMAL LOW (ref 36.0–46.0)
Hemoglobin: 10.8 g/dL — ABNORMAL LOW (ref 12.0–15.0)

## 2019-03-10 NOTE — Progress Notes (Signed)
Hgb 10.8 today. No aranesp required today per MD note.

## 2019-04-18 NOTE — Progress Notes (Signed)
[facility]  [address] [place], [place] [address] Phone: [phone]  Fax: [phone]   Clinic Day:  04/21/2019  Referring physician: Gayland Curry, MD  Chief Complaint: Misty [name] is a 79 y.o. female with anemia of chronic kidney disease who is seen for a 3 month assessment.  HPI: The patient was last seen in the medical oncology clinic on 01/13/2019. At that time, she felt good.  Exam was unremarkable. Hematocrit was 32.0 and hemoglobin 10.2. Creatinine was 3.32. Patient did not received Aranesp .   She received Aranesp on 01/27/2019.      Labs followed: 01/27/2019: Hematocrit 30.8. Hemoglobin 10.0.  Ferritin was 104. 03/10/2019: Hematocrit 33.0. Hemoglobin 10.8.  During the interim, she has felt "pretty good". She notes feeling tired and running out of energy. She denies any B symptoms. Her diarrhea comes and goes. She notes a lot of bladder leakage for which she will have an upcoming appointment to resolve the issue. She has bilateral hip pain and is unable to walk far.   Her BP is 167/53 in the clinic today.    Past Medical History:  Diagnosis Date  . Anemia   . Cancer [facility]) 2011   rectal ca/chemo /rad Lenore Manner  . Chronic kidney disease   . Diabetes mellitus without complication ([place])    type 2  . Hypertension   . Personal history of chemotherapy   . Personal history of radiation therapy     Past Surgical History:  Procedure Laterality Date  . BREAST BIOPSY Left    bx/ clip-neg  . coloretcal cancer    . thyroid gland     surgery / unknown date    Family History  Problem Relation Age of Onset  . Breast cancer Neg Hx     Social History:  reports that she has never smoked. She has never used smokeless tobacco. She reports that she does not drink alcohol. No history on file for drug. She is originally from [place] but moved to [place] in 1972.[place]  [place].She denies any alcohol or tobacco use. She denies any exposure to radiation or toxins. 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 The patient is alone today.  Allergies:  Allergies  Allergen Reactions  . Penicillins Rash    Current Medications: Current Outpatient Medications  Medication Sig Dispense Refill  . acetaminophen (TYLENOL) 325 MG tablet Take 325 mg by mouth every 4 (four) hours as needed.     Marland Kitchen atorvastatin (LIPITOR) 40 MG tablet Take 40 mg by mouth daily at 6 PM.     . B Complex-C-Folic Acid (RENAL) 1 MG CAPS TK 1 C PO D    . carvedilol (COREG) 3.125 MG tablet Take 3.125 mg by mouth 2 (two) times daily with a meal.     . Cetirizine HCl 10 MG CAPS Take 1 capsule by mouth daily.     . clopidogrel (PLAVIX) 75 MG tablet Take 75 mg by mouth daily.     . colestipol (COLESTID) 1 g tablet Take 1 g by mouth 2 (two) times daily.     . ferrous sulfate 325 (65 FE) MG tablet Take 325 mg by mouth 3 (three) times a week.     . Fiber Diet TABS Take 2 tablets by mouth 2 (two) times daily.     Marland Kitchen gabapentin (NEURONTIN) 300 MG capsule Take 300 mg by mouth 2 (  two) times daily.     Marland Kitchen glimepiride (AMARYL) 2 MG tablet Take 2 mg by mouth daily with breakfast.     . glucose blood test strip 1 each by Finger Stick route 4 times daily (after meals and nightly). Use as directed    . HUMALOG 100 UNIT/ML injection Inject into the skin as directed. Sliding Scale    . hydrALAZINE (APRESOLINE) 25 MG tablet Take 25 mg by mouth daily.     . insulin glargine (LANTUS) 100 UNIT/ML injection Inject 35 Units into the skin 2 (two) times daily.     . INSULIN SYRINGE 1CC/29G 29G X 1/2" 1 ML MISC     . latanoprost (XALATAN) 0.005 % ophthalmic solution Place 1 drop into the left eye at bedtime.     Marland Kitchen lisinopril (ZESTRIL) 10 MG tablet Take 10 mg by mouth daily.     Marland Kitchen NIFEdipine (ADALAT CC) 60 MG 24 hr tablet Take 60 mg by mouth daily.     . sodium bicarbonate 650 MG tablet Take  650 mg by mouth daily.     . SURE COMFORT PEN NEEDLES 31G X 8 MM MISC      No current facility-administered medications for this visit.    Review of Systems  Constitutional: Positive for malaise/fatigue (running out of energy). Negative for chills, diaphoresis, fever and weight loss (up 3 pounds).       Feels "pretty good".  HENT: Negative.  Negative for congestion, ear pain, hearing loss, nosebleeds, sinus pain, sore throat and tinnitus.   Eyes: Negative.  Negative for blurred vision and double vision.  Respiratory: Negative.  Negative for cough, hemoptysis, sputum production and shortness of breath.   Cardiovascular: Negative.  Negative for chest pain, palpitations and leg swelling.  Gastrointestinal: Positive for diarrhea (chronic; on Imodium; comes and goes). Negative for abdominal pain, blood in stool, constipation, melena, nausea and vomiting.  Genitourinary: Negative for dysuria, frequency, hematuria and urgency.       Prolapsed bladder. Bladder leakage.  Musculoskeletal: Positive for joint pain (hips; can not walk long distances). Negative for back pain, myalgias and neck pain.  Skin: Negative.  Negative for itching and rash.  Neurological: Negative.  Negative for dizziness, tingling, sensory change, focal weakness, weakness and headaches.  Endo/Heme/Allergies: Negative.  Does not bruise/bleed easily.  Psychiatric/Behavioral: Negative.  Negative for depression and memory loss. The patient is not nervous/anxious and does not have insomnia.   All other systems reviewed and are negative.  Performance status (ECOG):  1  Vitals Blood pressure (!) 167/53, pulse 71, temperature (!) 96 F (35.6 C), temperature source Tympanic, resp. rate 18, height '5\' 3"'$  (1.6 m), weight 140 lb 14 oz (63.9 kg), SpO2 99 %.   Physical Exam  Constitutional: She is oriented to person, place, and time. She appears well-developed and well-nourished. No distress.  HENT:  Head: Normocephalic and atraumatic.    Mouth/Throat: Oropharynx is clear and moist. No oropharyngeal exudate.  Short blonde hair.  Mask.  Eyes: Pupils are equal, round, and reactive to light. Conjunctivae and EOM are normal. No scleral icterus.  Glasses.  Blue eyes.  Neck: No JVD present.  Cardiovascular: Normal rate, regular rhythm and normal heart sounds.  No murmur heard. Pulmonary/Chest: Effort normal and breath sounds normal. No respiratory distress. She has no wheezes. She has no rales. She exhibits no tenderness.  Abdominal: Soft. Bowel sounds are normal. She exhibits no distension and no mass. There is no abdominal tenderness. There is no rebound and  no guarding.  Musculoskeletal:        General: No tenderness or edema.     Cervical back: Normal range of motion and neck supple.  Lymphadenopathy:       Head (right side): No preauricular, no posterior auricular and no occipital adenopathy present.       Head (left side): No preauricular, no posterior auricular and no occipital adenopathy present.    She has no cervical adenopathy.    She has no axillary adenopathy.       Right: No inguinal and no supraclavicular adenopathy present.       Left: No inguinal and no supraclavicular adenopathy present.  Neurological: She is alert and oriented to person, place, and time.  Skin: Skin is warm and dry. No rash noted. She is not diaphoretic. No erythema. No pallor.  Psychiatric: She has a normal mood and affect. Judgment and thought content normal.  Nursing note and vitals reviewed.   Appointment on 04/21/2019  Component Date Value Ref Range Status  . WBC 04/21/2019 7.6  4.0 - 10.5 K/uL Final  . RBC 04/21/2019 3.63* 3.87 - 5.11 MIL/uL Final  . Hemoglobin 04/21/2019 10.4* 12.0 - 15.0 g/dL Final  . HCT 04/21/2019 31.9* 36.0 - 46.0 % Final  . MCV 04/21/2019 87.9  80.0 - 100.0 fL Final  . MCH 04/21/2019 28.7  26.0 - 34.0 pg Final  . MCHC 04/21/2019 32.6  30.0 - 36.0 g/dL Final  . RDW 04/21/2019 14.8  11.5 - 15.5 % Final  .  Platelets 04/21/2019 276  150 - 400 K/uL Final  . nRBC 04/21/2019 0.0  0.0 - 0.2 % Final  . Neutrophils Relative % 04/21/2019 66  % Final  . Neutro Abs 04/21/2019 5.0  1.7 - 7.7 K/uL Final  . Lymphocytes Relative 04/21/2019 21  % Final  . Lymphs Abs 04/21/2019 1.6  0.7 - 4.0 K/uL Final  . Monocytes Relative 04/21/2019 6  % Final  . Monocytes Absolute 04/21/2019 0.5  0.1 - 1.0 K/uL Final  . Eosinophils Relative 04/21/2019 5  % Final  . Eosinophils Absolute 04/21/2019 0.4  0.0 - 0.5 K/uL Final  . Basophils Relative 04/21/2019 1  % Final  . Basophils Absolute 04/21/2019 0.1  0.0 - 0.1 K/uL Final  . Immature Granulocytes 04/21/2019 1  % Final  . Abs Immature Granulocytes 04/21/2019 0.06  0.00 - 0.07 K/uL Final   Performed at [facility], [address], [place], [place] [address]  . Sodium 04/21/2019 135  135 - 145 mmol/L Final  . Potassium 04/21/2019 4.5  3.5 - 5.1 mmol/L Final  . Chloride 04/21/2019 101  98 - 111 mmol/L Final  . CO2 04/21/2019 23  22 - 32 mmol/L Final  . Glucose, Bld 04/21/2019 123* 70 - 99 mg/dL Final  . BUN 04/21/2019 47* 8 - 23 mg/dL Final  . Creatinine, Ser 04/21/2019 3.68* 0.44 - 1.00 mg/dL Final  . Calcium 04/21/2019 9.0  8.9 - 10.3 mg/dL Final  . GFR calc non Af Amer 04/21/2019 11* >60 mL/min Final  . GFR calc Af Amer 04/21/2019 13* >60 mL/min Final  . Anion gap 04/21/2019 11  5 - 15 Final   Performed at [facility], [address] [place], [place] [address]    Assessment:  Lalitha Ilyas is a 79 y.o. female with anemia of chronic renal disease.  She has chronic renal insufficiency.Creatinine was 2.7 (creatinine clearance 17 ml/min) on 09/07/2018.  Work-up on 08/07/2020revealed ahematocrit 33.3,  hemoglobin 11.0, MCV 88.6, platelets 304,000,andWBC 7,400.Differential was normal.Ferritinwas135with an iron saturation of26% and aTIBC 315. Vitamin B12 was511. Folate >100.0. SPEPrevealed no M-spike. Kappa free light  chains110.2, lambda free light chains 38.5.andratio 2.86(0.26-1.65).24 hour UPEP revealed 1384 mg/24 hoursof proteinwithout a monoclonal spike.  She began Aranesp 100 mcg on 12/16/2018 (last 01/27/2019).  She has amonoclonal gammopathy or unknown significance(MGUS).Bone marrow biopsyon 02/04/2017 was sub-optimal.Congo red stains and abdominal wall fat pad aspirationwere negative.She was briefly treated with Velcade and Decadron beginning in 08/12/2017. Treatment was complicated by hyperglycemia and diarrhea. 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 There was very variably cellular marrow, overall 50% with trilineage hematopoiesis. There were abnormal plasma cells present at normal plasma cell background detected by flow cytometry. Findings wereconsistent with a low level of involvement of bone marrow with the patient's previously diagnosed plasma cell neoplasm.  24-hour UPEPon 12/28/2017 revealed '1911mg'$ /24hrsprotein with no M-spike and normal immunofixation. Kappa free light chains were 101, lambda free light chain 6.39 and a ratio of 15.81 (2.04 - 10.37).Whole body MRIon 02/04/2018 revealed no evidence of discrete focal dominant marrow lesion.   M-spike has been followed (gm/dL): 0 on 10/15/2018 and 0 on 04/21/2019. Kappa FLCA has been followed: 110.2 (ratio 2.86) on 10/15/2018 and 98.2 (ratio 2.21) on 04/21/2019.  She has a history of rectal cancers/p neoadjuvant chemotherapy followed by resection on 08/08/2009. Transrectalultrasound in 01/2011confirmed a 4 cm mass from the anal verge, encompassing 1/3 of the circumference of the rectum.Clinical stagewas T3N0. She received3 cycles of CI 5FU.There was noresidual carcinomaon resection. She underwent a restorative proctectomy with colonic J-pouch and coloanal anastomosis. Two lymph nodes werenegative. She declined adjuvant  chemotherapy.Colonoscopyin 03/2017 showed no evidence of recurrence.CEAwas1.5on 10/15/2018.  Symptomatically, she feels "pretty good".  She notes fatigue.  Exam is stable.  Plan: 1.   Labs today: CBC with diff, BMP, SPEP, FLCA, ferritin. 2.   Anemia of chronic kidney disease Clinically, she is doing well. Hematocrit 31.9. Hemoglobin 10.4.              She received Aranesp on 01/27/2019. Continue Aranesp when hemoglobin is < 10. 3.Monoclonal gammopathy or unknown significance (MGUS) M-spike was 0 on 10/15/2018 and 04/21/2019. Free light chains showed a slight excess of kappa free light chains. Continue to monitor every 6 months. 4.Rectal cancer(2011) Patient denies any concerns. 5.No Aranesp today 6.   RTC in 4 weeks for labs (hemoglobin/hematocrit) and +/- Aranesp 7.   RTC in 10 weeks for labs (hemoglobin/hematocrit, ferritin) and +/- Aranesp 8.   RTC in 16 weeks for MD assessment, labs (CBC with differential, creatinine) and +/- Aranesp.  I discussed the assessment and treatment plan with the patient.  The patient was provided an opportunity to ask questions and all were answered.  The patient agreed with the plan and demonstrated an understanding of the instructions.  The patient was advised to call back if the symptoms worsen or if the condition fails to improve as anticipated.   Lequita Asal, MD, PhD    04/21/2019, 2:23 PM  I, Selena Batten, am acting as scribe for [employer]. Mike Gip, MD, PhD.  I, Inioluwa Baris C. Mike Gip, MD, have reviewed the above documentation for accuracy and completeness, and I agree with the above.

## 2019-04-21 ENCOUNTER — Inpatient Hospital Stay: Payer: Medicare Other

## 2019-04-21 ENCOUNTER — Other Ambulatory Visit: Payer: Self-pay | Admitting: Hematology and Oncology

## 2019-04-21 ENCOUNTER — Other Ambulatory Visit: Payer: Self-pay

## 2019-04-21 ENCOUNTER — Inpatient Hospital Stay: Payer: Medicare Other | Attending: Hematology and Oncology | Admitting: Hematology and Oncology

## 2019-04-21 ENCOUNTER — Encounter: Payer: Self-pay | Admitting: Hematology and Oncology

## 2019-04-21 VITALS — BP 167/53 | HR 71 | Temp 96.0°F | Resp 18 | Ht 63.0 in | Wt 140.9 lb

## 2019-04-21 DIAGNOSIS — N184 Chronic kidney disease, stage 4 (severe): Secondary | ICD-10-CM | POA: Diagnosis present

## 2019-04-21 DIAGNOSIS — D472 Monoclonal gammopathy: Secondary | ICD-10-CM

## 2019-04-21 DIAGNOSIS — D631 Anemia in chronic kidney disease: Secondary | ICD-10-CM

## 2019-04-21 DIAGNOSIS — C20 Malignant neoplasm of rectum: Secondary | ICD-10-CM | POA: Diagnosis present

## 2019-04-21 LAB — CBC WITH DIFFERENTIAL/PLATELET
Abs Immature Granulocytes: 0.06 10*3/uL (ref 0.00–0.07)
Basophils Absolute: 0.1 10*3/uL (ref 0.0–0.1)
Basophils Relative: 1 %
Eosinophils Absolute: 0.4 10*3/uL (ref 0.0–0.5)
Eosinophils Relative: 5 %
HCT: 31.9 % — ABNORMAL LOW (ref 36.0–46.0)
Hemoglobin: 10.4 g/dL — ABNORMAL LOW (ref 12.0–15.0)
Immature Granulocytes: 1 %
Lymphocytes Relative: 21 %
Lymphs Abs: 1.6 10*3/uL (ref 0.7–4.0)
MCH: 28.7 pg (ref 26.0–34.0)
MCHC: 32.6 g/dL (ref 30.0–36.0)
MCV: 87.9 fL (ref 80.0–100.0)
Monocytes Absolute: 0.5 10*3/uL (ref 0.1–1.0)
Monocytes Relative: 6 %
Neutro Abs: 5 10*3/uL (ref 1.7–7.7)
Neutrophils Relative %: 66 %
Platelets: 276 10*3/uL (ref 150–400)
RBC: 3.63 MIL/uL — ABNORMAL LOW (ref 3.87–5.11)
RDW: 14.8 % (ref 11.5–15.5)
WBC: 7.6 10*3/uL (ref 4.0–10.5)
nRBC: 0 % (ref 0.0–0.2)

## 2019-04-21 LAB — BASIC METABOLIC PANEL
Anion gap: 11 (ref 5–15)
BUN: 47 mg/dL — ABNORMAL HIGH (ref 8–23)
CO2: 23 mmol/L (ref 22–32)
Calcium: 9 mg/dL (ref 8.9–10.3)
Chloride: 101 mmol/L (ref 98–111)
Creatinine, Ser: 3.68 mg/dL — ABNORMAL HIGH (ref 0.44–1.00)
GFR calc Af Amer: 13 mL/min — ABNORMAL LOW (ref >60)
GFR calc non Af Amer: 11 mL/min — ABNORMAL LOW (ref >60)
Glucose, Bld: 123 mg/dL — ABNORMAL HIGH (ref 70–99)
Potassium: 4.5 mmol/L (ref 3.5–5.1)
Sodium: 135 mmol/L (ref 135–145)

## 2019-04-21 LAB — FERRITIN: Ferritin: 158 ng/mL (ref 11–307)

## 2019-04-22 LAB — PROTEIN ELECTROPHORESIS, SERUM
A/G Ratio: 1.4 (ref 0.7–1.7)
Albumin ELP: 3.9 g/dL (ref 2.9–4.4)
Alpha-1-Globulin: 0.2 g/dL (ref 0.0–0.4)
Alpha-2-Globulin: 0.9 g/dL (ref 0.4–1.0)
Beta Globulin: 0.9 g/dL (ref 0.7–1.3)
Gamma Globulin: 0.8 g/dL (ref 0.4–1.8)
Globulin, Total: 2.7 g/dL (ref 2.2–3.9)
Total Protein ELP: 6.6 g/dL (ref 6.0–8.5)

## 2019-04-22 LAB — KAPPA/LAMBDA LIGHT CHAINS
Kappa free light chain: 98.2 mg/L — ABNORMAL HIGH (ref 3.3–19.4)
Kappa, lambda light chain ratio: 2.21 — ABNORMAL HIGH (ref 0.26–1.65)
Lambda free light chains: 44.4 mg/L — ABNORMAL HIGH (ref 5.7–26.3)

## 2019-05-19 ENCOUNTER — Inpatient Hospital Stay: Payer: Medicare Other | Attending: Nurse Practitioner

## 2019-05-19 ENCOUNTER — Other Ambulatory Visit: Payer: Self-pay

## 2019-05-19 ENCOUNTER — Inpatient Hospital Stay: Payer: Medicare Other

## 2019-05-19 VITALS — BP 153/54 | HR 75 | Temp 96.7°F | Resp 18

## 2019-05-19 DIAGNOSIS — D631 Anemia in chronic kidney disease: Secondary | ICD-10-CM | POA: Insufficient documentation

## 2019-05-19 DIAGNOSIS — N184 Chronic kidney disease, stage 4 (severe): Secondary | ICD-10-CM | POA: Insufficient documentation

## 2019-05-19 DIAGNOSIS — Z9221 Personal history of antineoplastic chemotherapy: Secondary | ICD-10-CM | POA: Diagnosis not present

## 2019-05-19 DIAGNOSIS — Z79899 Other long term (current) drug therapy: Secondary | ICD-10-CM | POA: Diagnosis not present

## 2019-05-19 DIAGNOSIS — Z923 Personal history of irradiation: Secondary | ICD-10-CM | POA: Diagnosis not present

## 2019-05-19 DIAGNOSIS — Z85040 Personal history of malignant carcinoid tumor of rectum: Secondary | ICD-10-CM | POA: Diagnosis not present

## 2019-05-19 DIAGNOSIS — D472 Monoclonal gammopathy: Secondary | ICD-10-CM | POA: Insufficient documentation

## 2019-05-19 LAB — HEMOGLOBIN AND HEMATOCRIT, BLOOD
HCT: 28.5 % — ABNORMAL LOW (ref 36.0–46.0)
Hemoglobin: 9.2 g/dL — ABNORMAL LOW (ref 12.0–15.0)

## 2019-05-19 MED ORDER — DARBEPOETIN ALFA 100 MCG/0.5ML IJ SOSY
100.0000 ug | PREFILLED_SYRINGE | Freq: Once | INTRAMUSCULAR | Status: AC
  Administered 2019-05-19: 15:00:00 100 ug via SUBCUTANEOUS

## 2019-05-19 NOTE — Patient Instructions (Signed)
Darbepoetin Alfa injection What is this medicine? DARBEPOETIN ALFA (dar be POE e tin AL fa) helps your body make more red blood cells. It is used to treat anemia caused by chronic kidney failure and chemotherapy. This medicine may be used for other purposes; ask your health care provider or pharmacist if you have questions. COMMON BRAND NAME(S): Aranesp What should I tell my health care provider before I take this medicine? They need to know if you have any of these conditions:  blood clotting disorders or history of blood clots  cancer patient not on chemotherapy  cystic fibrosis  heart disease, such as angina, heart failure, or a history of a heart attack  hemoglobin level of 12 g/dL or greater  high blood pressure  low levels of folate, iron, or vitamin B12  seizures  an unusual or allergic reaction to darbepoetin, erythropoietin, albumin, hamster proteins, latex, other medicines, foods, dyes, or preservatives  pregnant or trying to get pregnant  breast-feeding How should I use this medicine? This medicine is for injection into a vein or under the skin. It is usually given by a health care professional in a hospital or clinic setting. If you get this medicine at home, you will be taught how to prepare and give this medicine. Use exactly as directed. Take your medicine at regular intervals. Do not take your medicine more often than directed. It is important that you put your used needles and syringes in a special sharps container. Do not put them in a trash can. If you do not have a sharps container, call your pharmacist or healthcare provider to get one. A special MedGuide will be given to you by the pharmacist with each prescription and refill. Be sure to read this information carefully each time. Talk to your pediatrician regarding the use of this medicine in children. While this medicine may be used in children as young as 1 month of age for selected conditions, precautions do  apply. Overdosage: If you think you have taken too much of this medicine contact a poison control center or emergency room at once. NOTE: This medicine is only for you. Do not share this medicine with others. What if I miss a dose? If you miss a dose, take it as soon as you can. If it is almost time for your next dose, take only that dose. Do not take double or extra doses. What may interact with this medicine? Do not take this medicine with any of the following medications:  epoetin alfa This list may not describe all possible interactions. Give your health care provider a list of all the medicines, herbs, non-prescription drugs, or dietary supplements you use. Also tell them if you smoke, drink alcohol, or use illegal drugs. Some items may interact with your medicine. What should I watch for while using this medicine? Your condition will be monitored carefully while you are receiving this medicine. You may need blood work done while you are taking this medicine. This medicine may cause a decrease in vitamin B6. You should make sure that you get enough vitamin B6 while you are taking this medicine. Discuss the foods you eat and the vitamins you take with your health care professional. What side effects may I notice from receiving this medicine? Side effects that you should report to your doctor or health care professional as soon as possible:  allergic reactions like skin rash, itching or hives, swelling of the face, lips, or tongue  breathing problems  changes in   vision  chest pain  confusion, trouble speaking or understanding  feeling faint or lightheaded, falls  high blood pressure  muscle aches or pains  pain, swelling, warmth in the leg  rapid weight gain  severe headaches  sudden numbness or weakness of the face, arm or leg  trouble walking, dizziness, loss of balance or coordination  seizures (convulsions)  swelling of the ankles, feet, hands  unusually weak or  tired Side effects that usually do not require medical attention (report to your doctor or health care professional if they continue or are bothersome):  diarrhea  fever, chills (flu-like symptoms)  headaches  nausea, vomiting  redness, stinging, or swelling at site where injected This list may not describe all possible side effects. Call your doctor for medical advice about side effects. You may report side effects to FDA at 1-800-FDA-1088. Where should I keep my medicine? Keep out of the reach of children. Store in a refrigerator between 2 and 8 degrees C (36 and 46 degrees F). Do not freeze. Do not shake. Throw away any unused portion if using a single-dose vial. Throw away any unused medicine after the expiration date. NOTE: This sheet is a summary. It may not cover all possible information. If you have questions about this medicine, talk to your doctor, pharmacist, or health care provider.  2020 Elsevier/Gold Standard (2017-03-11 16:44:20)  

## 2019-06-28 ENCOUNTER — Other Ambulatory Visit: Payer: Self-pay

## 2019-06-28 DIAGNOSIS — C20 Malignant neoplasm of rectum: Secondary | ICD-10-CM

## 2019-06-30 ENCOUNTER — Inpatient Hospital Stay: Payer: Medicare Other

## 2019-06-30 ENCOUNTER — Inpatient Hospital Stay: Payer: Medicare Other | Attending: Hematology and Oncology

## 2019-06-30 ENCOUNTER — Other Ambulatory Visit: Payer: Self-pay

## 2019-06-30 VITALS — BP 157/51 | HR 72 | Temp 97.8°F | Resp 20

## 2019-06-30 DIAGNOSIS — Z923 Personal history of irradiation: Secondary | ICD-10-CM | POA: Insufficient documentation

## 2019-06-30 DIAGNOSIS — R5383 Other fatigue: Secondary | ICD-10-CM | POA: Diagnosis not present

## 2019-06-30 DIAGNOSIS — C20 Malignant neoplasm of rectum: Secondary | ICD-10-CM

## 2019-06-30 DIAGNOSIS — R32 Unspecified urinary incontinence: Secondary | ICD-10-CM | POA: Diagnosis not present

## 2019-06-30 DIAGNOSIS — D472 Monoclonal gammopathy: Secondary | ICD-10-CM | POA: Insufficient documentation

## 2019-06-30 DIAGNOSIS — D631 Anemia in chronic kidney disease: Secondary | ICD-10-CM | POA: Diagnosis present

## 2019-06-30 DIAGNOSIS — Z85048 Personal history of other malignant neoplasm of rectum, rectosigmoid junction, and anus: Secondary | ICD-10-CM | POA: Insufficient documentation

## 2019-06-30 DIAGNOSIS — Z79899 Other long term (current) drug therapy: Secondary | ICD-10-CM | POA: Insufficient documentation

## 2019-06-30 DIAGNOSIS — N184 Chronic kidney disease, stage 4 (severe): Secondary | ICD-10-CM | POA: Insufficient documentation

## 2019-06-30 DIAGNOSIS — Z9221 Personal history of antineoplastic chemotherapy: Secondary | ICD-10-CM | POA: Diagnosis not present

## 2019-06-30 LAB — FERRITIN: Ferritin: 161 ng/mL (ref 11–307)

## 2019-06-30 LAB — HEMOGLOBIN AND HEMATOCRIT, BLOOD
HCT: 29.1 % — ABNORMAL LOW (ref 36.0–46.0)
Hemoglobin: 9.6 g/dL — ABNORMAL LOW (ref 12.0–15.0)

## 2019-06-30 MED ORDER — DARBEPOETIN ALFA 100 MCG/0.5ML IJ SOSY
100.0000 ug | PREFILLED_SYRINGE | Freq: Once | INTRAMUSCULAR | Status: AC
  Administered 2019-06-30: 100 ug via SUBCUTANEOUS

## 2019-06-30 NOTE — Patient Instructions (Signed)
Darbepoetin Alfa injection What is this medicine? DARBEPOETIN ALFA (dar be POE e tin AL fa) helps your body make more red blood cells. It is used to treat anemia caused by chronic kidney failure and chemotherapy. This medicine may be used for other purposes; ask your health care provider or pharmacist if you have questions. COMMON BRAND NAME(S): Aranesp What should I tell my health care provider before I take this medicine? They need to know if you have any of these conditions:  blood clotting disorders or history of blood clots  cancer patient not on chemotherapy  cystic fibrosis  heart disease, such as angina, heart failure, or a history of a heart attack  hemoglobin level of 12 g/dL or greater  high blood pressure  low levels of folate, iron, or vitamin B12  seizures  an unusual or allergic reaction to darbepoetin, erythropoietin, albumin, hamster proteins, latex, other medicines, foods, dyes, or preservatives  pregnant or trying to get pregnant  breast-feeding How should I use this medicine? This medicine is for injection into a vein or under the skin. It is usually given by a health care professional in a hospital or clinic setting. If you get this medicine at home, you will be taught how to prepare and give this medicine. Use exactly as directed. Take your medicine at regular intervals. Do not take your medicine more often than directed. It is important that you put your used needles and syringes in a special sharps container. Do not put them in a trash can. If you do not have a sharps container, call your pharmacist or healthcare provider to get one. A special MedGuide will be given to you by the pharmacist with each prescription and refill. Be sure to read this information carefully each time. Talk to your pediatrician regarding the use of this medicine in children. While this medicine may be used in children as young as 1 month of age for selected conditions, precautions do  apply. Overdosage: If you think you have taken too much of this medicine contact a poison control center or emergency room at once. NOTE: This medicine is only for you. Do not share this medicine with others. What if I miss a dose? If you miss a dose, take it as soon as you can. If it is almost time for your next dose, take only that dose. Do not take double or extra doses. What may interact with this medicine? Do not take this medicine with any of the following medications:  epoetin alfa This list may not describe all possible interactions. Give your health care provider a list of all the medicines, herbs, non-prescription drugs, or dietary supplements you use. Also tell them if you smoke, drink alcohol, or use illegal drugs. Some items may interact with your medicine. What should I watch for while using this medicine? Your condition will be monitored carefully while you are receiving this medicine. You may need blood work done while you are taking this medicine. This medicine may cause a decrease in vitamin B6. You should make sure that you get enough vitamin B6 while you are taking this medicine. Discuss the foods you eat and the vitamins you take with your health care professional. What side effects may I notice from receiving this medicine? Side effects that you should report to your doctor or health care professional as soon as possible:  allergic reactions like skin rash, itching or hives, swelling of the face, lips, or tongue  breathing problems  changes in   vision  chest pain  confusion, trouble speaking or understanding  feeling faint or lightheaded, falls  high blood pressure  muscle aches or pains  pain, swelling, warmth in the leg  rapid weight gain  severe headaches  sudden numbness or weakness of the face, arm or leg  trouble walking, dizziness, loss of balance or coordination  seizures (convulsions)  swelling of the ankles, feet, hands  unusually weak or  tired Side effects that usually do not require medical attention (report to your doctor or health care professional if they continue or are bothersome):  diarrhea  fever, chills (flu-like symptoms)  headaches  nausea, vomiting  redness, stinging, or swelling at site where injected This list may not describe all possible side effects. Call your doctor for medical advice about side effects. You may report side effects to FDA at 1-800-FDA-1088. Where should I keep my medicine? Keep out of the reach of children. Store in a refrigerator between 2 and 8 degrees C (36 and 46 degrees F). Do not freeze. Do not shake. Throw away any unused portion if using a single-dose vial. Throw away any unused medicine after the expiration date. NOTE: This sheet is a summary. It may not cover all possible information. If you have questions about this medicine, talk to your doctor, pharmacist, or health care provider.  2020 Elsevier/Gold Standard (2017-03-11 16:44:20)  

## 2019-07-26 ENCOUNTER — Ambulatory Visit: Admission: RE | Admit: 2019-07-26 | Payer: Medicare Other | Source: Ambulatory Visit

## 2019-07-26 ENCOUNTER — Ambulatory Visit
Admission: RE | Admit: 2019-07-26 | Discharge: 2019-07-26 | Disposition: A | Discharge status: Home / Self Care | Payer: Medicare Other | Source: Ambulatory Visit | Attending: Family Medicine | Admitting: Family Medicine

## 2019-07-26 DIAGNOSIS — N6489 Other specified disorders of breast: Secondary | ICD-10-CM | POA: Diagnosis present

## 2019-07-26 IMAGING — MG MM DIGITAL DIAGNOSTIC UNILAT*L* W/ TOMO W/ CAD
8 series · 9 of 24 positions shown · non-contrast
Comparison: Previous exams

CLINICAL DATA: Follow-up for probably benign left breast asymmetry.

EXAM:
DIGITAL DIAGNOSTIC UNILATERAL LEFT MAMMOGRAM WITH CAD AND TOMO
LEFT BREAST ULTRASOUND

[L MLO synth-2D]
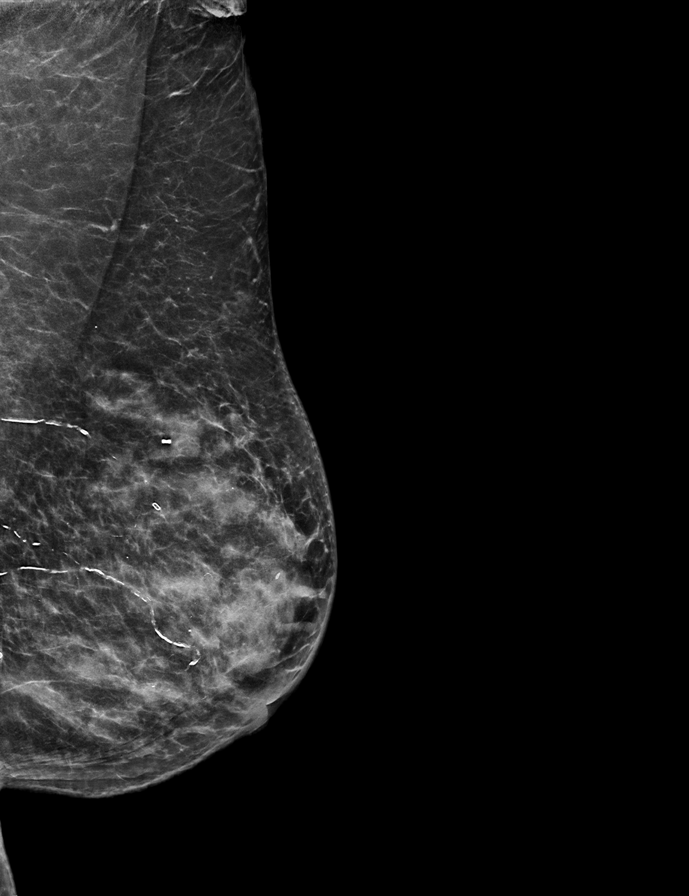

[L ML synth-2D]
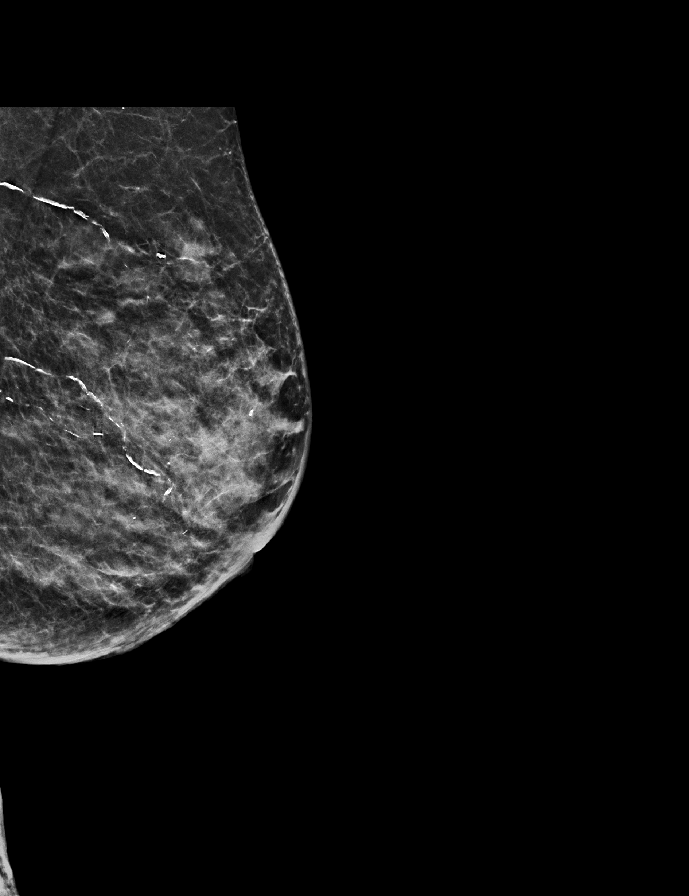

[L CC synth-2D (1 of 2)]
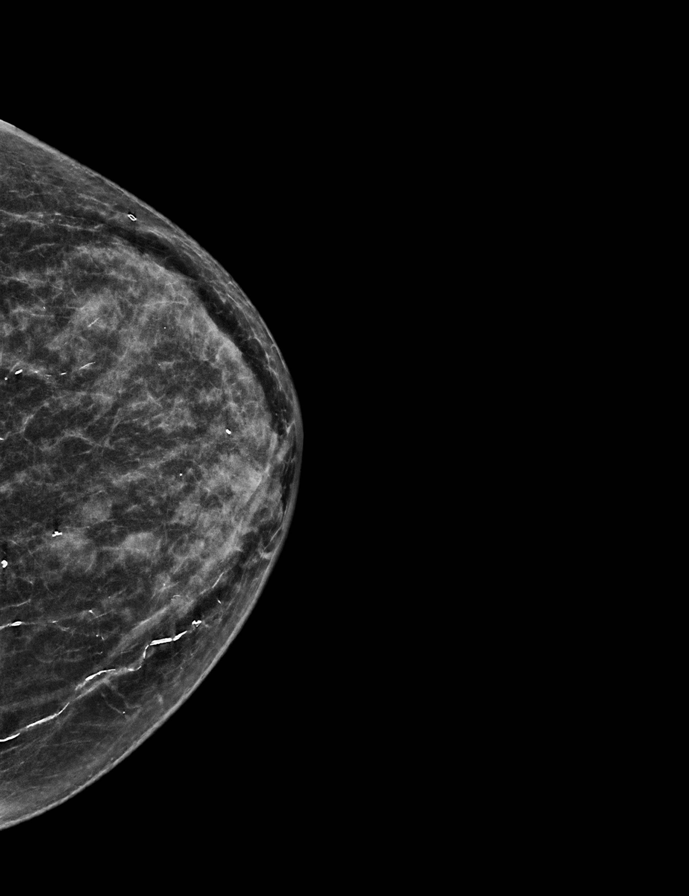

[L CC synth-2D (2 of 2)]
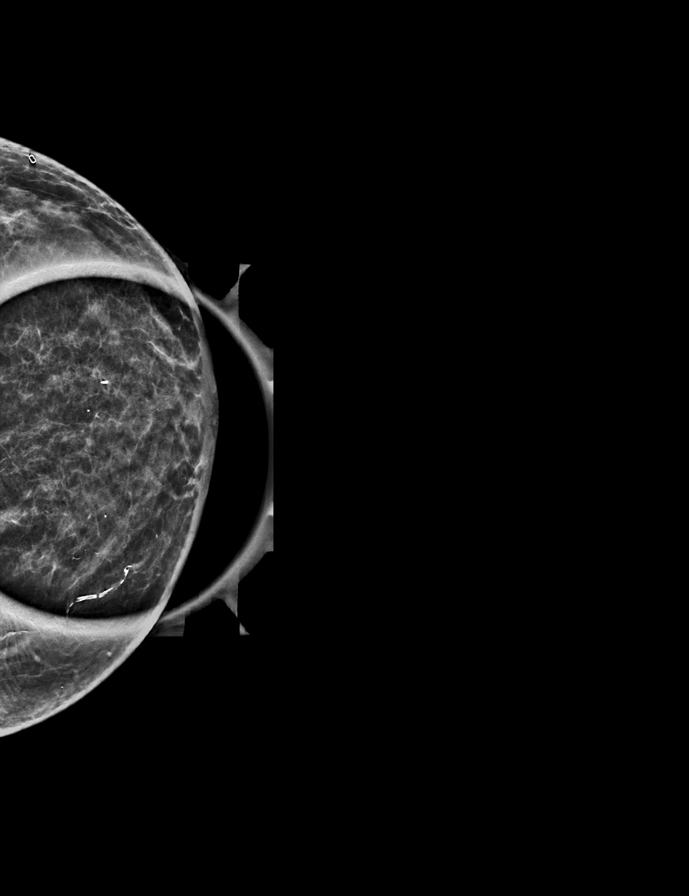

[L MLO tomo · 2 of 61 frames shown]
[frame 20/61]
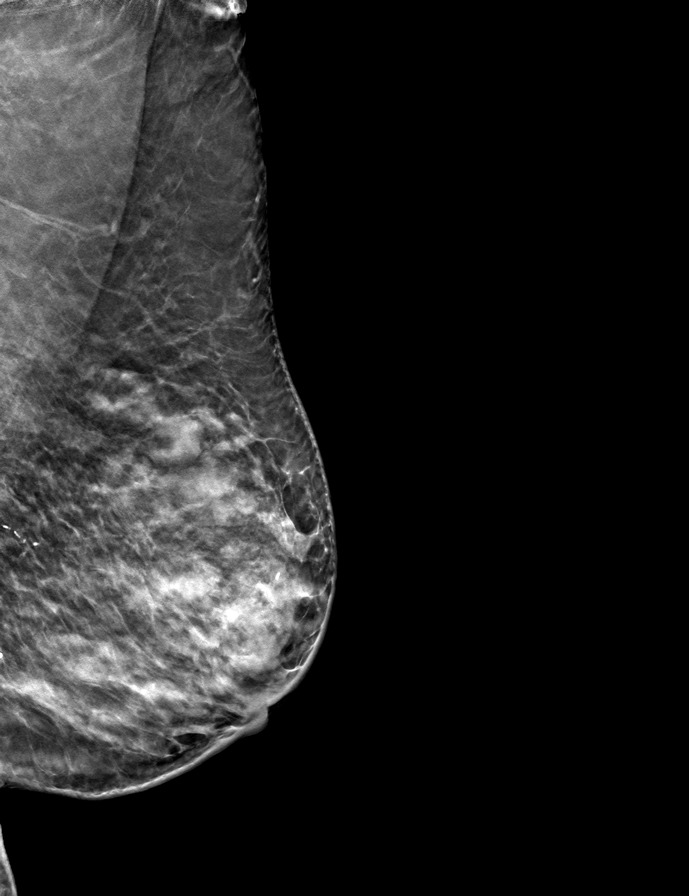
[frame 31/61]
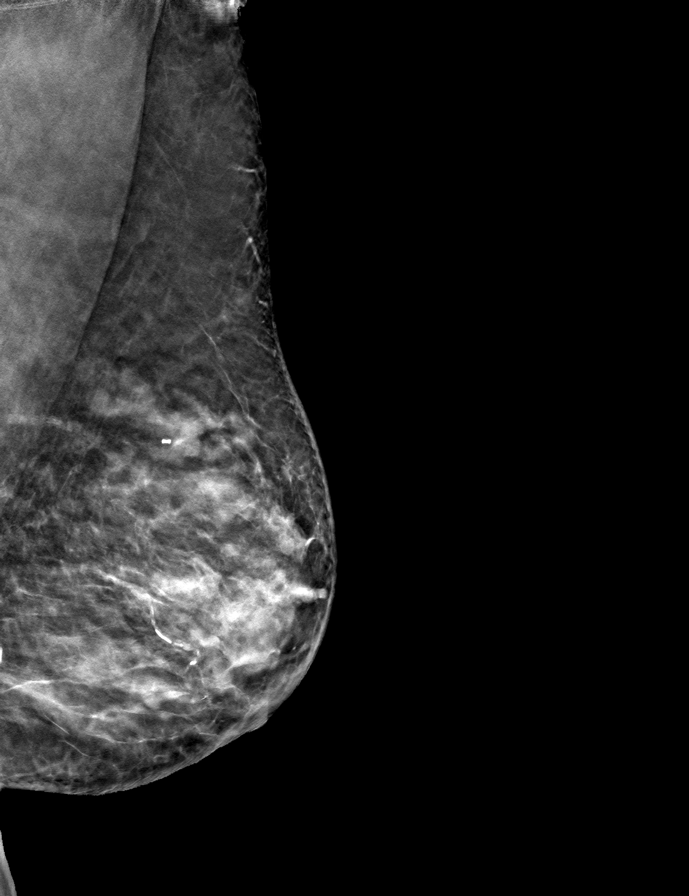

[L CC tomo (1 of 2) · tomo slice 26/51.0]
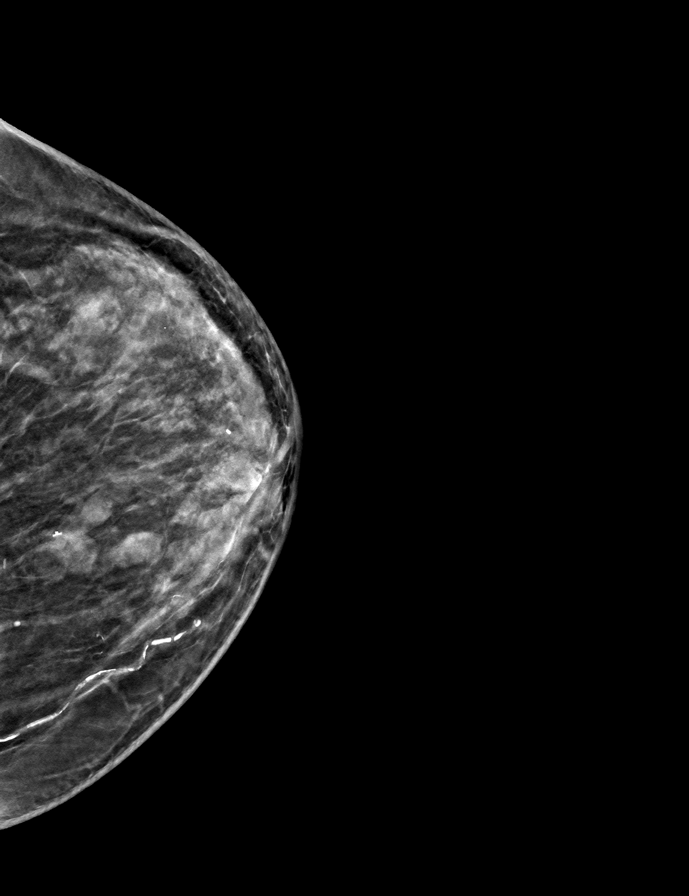

[L ML tomo · tomo slice 26/51.0]
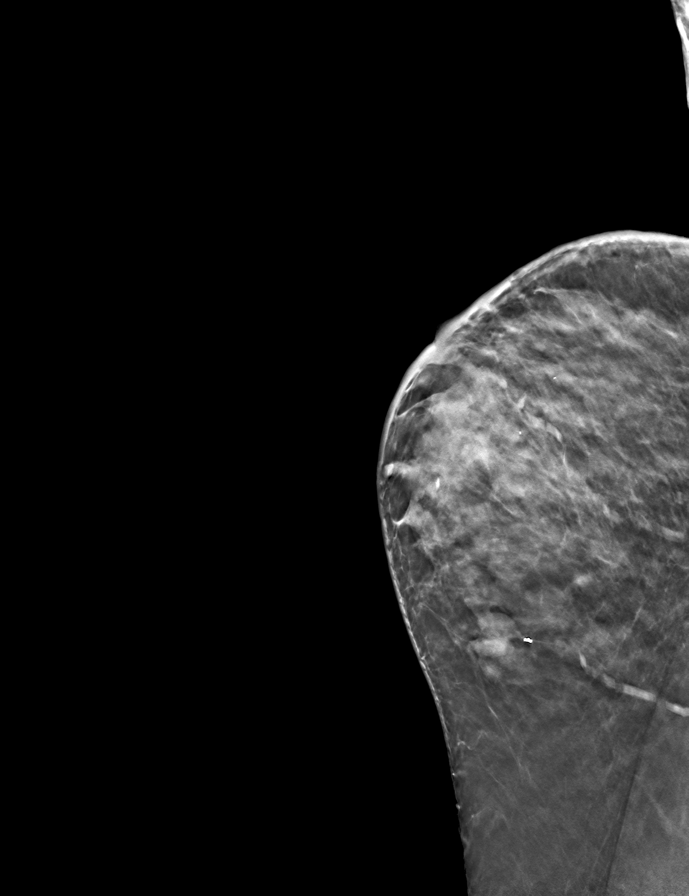

[L CC tomo (2 of 2) · tomo slice 21/40.0]
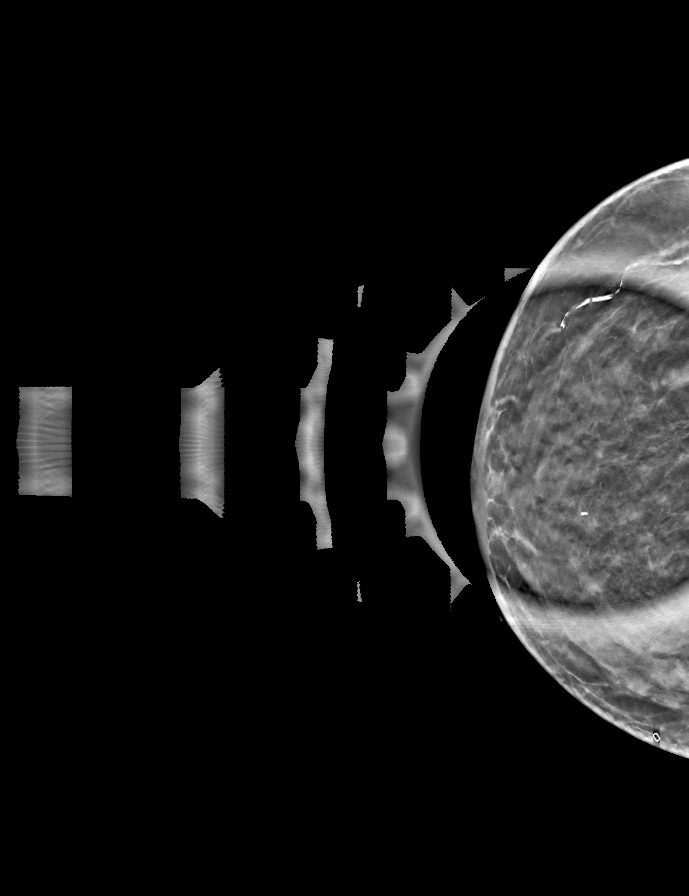

[9 of 24 positions shown; findings below may reference images not displayed]

ACR Breast Density Category d: The breast tissue is extremely dense,
which lowers the sensitivity of mammography.
FINDINGS: No suspicious masses or calcifications are seen in the left breast.
The asymmetry in the slightly inner left breast appears unchanged.
There is no mammographic evidence of malignancy in the left breast.

Mammographic images were processed with CAD.
IMPRESSION: Stable probably benign left breast asymmetry. No mammographic
evidence of malignancy in the left breast.

RECOMMENDATION:
Recommend bilateral diagnostic mammography in 6 months which will
demonstrate 1 year of stability of the probably benign left breast
asymmetry.

I have discussed the findings and recommendations with the patient.
If applicable, a reminder letter will be sent to the patient
regarding the next appointment.

BI-RADS CATEGORY  3: Probably benign.

## 2019-08-09 NOTE — Progress Notes (Signed)
[facility]  [address] [place], [place] [address] Phone: [phone]  Fax: [phone]   Clinic Day:  08/11/2019  Referring physician: Murlean Iba, MD  Chief Complaint: Misty [name] is a 79 y.o. female with anemia of chronic kidney disease who is seen for a 4 month assessment.  HPI: The patient was last seen in the medical oncology clinic on 04/21/2019. At that time, she felt "pretty good". She noted fatigue. Exam was stable. Hematocrit 31.9, hemoglobin 10.4, platelets 276,000, WBC 7,600. Ferritin was 158. Creatinine was 3.68. M spike was 0 gm/dL. Kappa free light chain was 98.2, lambda free light chains 44.4, and ratio 2.21. Patient did not receive any Aranesp.  She was seen by Dr. Linus Mako in urogynecology for a new patient consult on 05/12/2019. She was having urinary leakage associated with a sense of urgency. She was changing pads a couple times a day. She noted her symptoms had no clear triggers. She tried [employer]  but was unsuccessful. She had intermittent constipation and diarrhea. Dr. Darlyne Russian discussed a bladder function test and completing a 3 day bladder diary.   CBC followed:  05/19/2019: Hematocrit 28.5. Hemoglobin 9.2.  06/30/2019: Hematocrit 29.1. Hemoglobin 9.6. Ferritin was 161.  Patient received Aranesp on 05/19/2019 and 06/30/2019.   She was seen in [place] neph[facility] by Primus Bravo, NP on 07/06/2019. She was feeling good. She had a good appetite. She had no complaints. Home peritoneal dialysis vs hemodialysis was discussed. Patient will follow up on 09/05/2019.   Cystoscopy on 07/14/2019 by Dr Linus Mako revealed a urethral polyp. Plan was for biopsy in the OR.  Left unilateral mammogram on 07/26/2019 revealed stable probably benign left breast asymmetry. There was no mammographic evidence of malignancy in the left breast. Recommended bilateral diagnostic mammography in 6 months which will demonstrate 1 year of stability of  the probably benign left breast asymmetry.  Symptomatically, she was feeling anxious with a blood pressure of 215/67 (repeat BP #1 206/64, repeat BP #2 211/63). Patient reports she is out of daily NIFEdipine 60 mg. She does not check her BP regularly but when she does, it runs in the 160's. I strongly encouraged the patient to go to urgent care/ED to prevent a stroke. Patient refused ED/urgent care and states "I will just go home and lay down". She has white coat hypertension. She had diarrhea the other night due to eating lasagna. She continues to have bilateral hip pain. She will follow up with her PCP regarding pain in hips.    She has an upcoming procedure for a polyp removal with Dr. Linus Mako. She wants her hemoglobin to be good for the procedure. She reports feeling good when her hemoglobin is at 11 and she can do all the things she would like. At times she feels tired after doing minimal activity.    Past Medical History:  Diagnosis Date  . Anemia   . Cancer [facility]) 2011   rectal ca/chemo /rad Lenore Manner  . Chronic kidney disease   . Diabetes mellitus without complication ([place])    type 2  . Hypertension   . Personal history of chemotherapy   . Personal history of radiation therapy     Past Surgical History:  Procedure Laterality Date  . BREAST BIOPSY Left    bx/ clip-neg  . coloretcal cancer    . thyroid gland     surgery / unknown date    Family History  Problem Relation Age of Onset  . Breast cancer Neg Hx  Social History:  reports that she has never smoked. She has never used smokeless tobacco. She reports that she does not drink alcohol. No history on file for drug. She is originally from [place] but moved to [place] in 1972.[place].She denies any alcohol or tobacco use. She denies any exposure to radiation or toxins. 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 The  patient is alone today.  Allergies:  Allergies  Allergen Reactions  . Penicillins Rash    Current Medications: Current Outpatient Medications  Medication Sig Dispense Refill  . acetaminophen (TYLENOL) 325 MG tablet Take 325 mg by mouth every 4 (four) hours as needed.     Marland Kitchen atorvastatin (LIPITOR) 40 MG tablet Take 40 mg by mouth daily at 6 PM.     . B Complex-C-Folic Acid (RENAL) 1 MG CAPS TK 1 C PO D    . carvedilol (COREG) 3.125 MG tablet Take 3.125 mg by mouth 2 (two) times daily with a meal.     . Cetirizine HCl 10 MG CAPS Take 1 capsule by mouth daily.     . clopidogrel (PLAVIX) 75 MG tablet Take 75 mg by mouth daily.     . colestipol (COLESTID) 1 g tablet Take 1 g by mouth 2 (two) times daily.     . ferrous sulfate 325 (65 FE) MG tablet Take 325 mg by mouth 3 (three) times a week.     . Fiber Diet TABS Take 2 tablets by mouth 2 (two) times daily.     Marland Kitchen gabapentin (NEURONTIN) 300 MG capsule Take 300 mg by mouth 2 (two) times daily.     Marland Kitchen glimepiride (AMARYL) 2 MG tablet Take 2 mg by mouth daily with breakfast.     . glucose blood test strip 1 each by Finger Stick route 4 times daily (after meals and nightly). Use as directed    . HUMALOG 100 UNIT/ML injection Inject into the skin as directed. Sliding Scale    . hydrALAZINE (APRESOLINE) 25 MG tablet Take 25 mg by mouth daily.     . insulin glargine (LANTUS) 100 UNIT/ML injection Inject 35 Units into the skin 2 (two) times daily.     . INSULIN SYRINGE 1CC/29G 29G X 1/2" 1 ML MISC     . latanoprost (XALATAN) 0.005 % ophthalmic solution Place 1 drop into the left eye at bedtime.     Marland Kitchen lisinopril (ZESTRIL) 10 MG tablet Take 10 mg by mouth daily.     Marland Kitchen NIFEdipine (ADALAT CC) 60 MG 24 hr tablet Take 60 mg by mouth daily.     . sodium bicarbonate 650 MG tablet Take 650 mg by mouth daily.     . SURE COMFORT PEN NEEDLES 31G X 8 MM MISC      No current facility-administered medications for this visit.    Review of Systems    Constitutional: Positive for weight loss (4 lbs). Negative for chills, diaphoresis, fever and malaise/fatigue (running out of energy at times).       Feeling anxious.  HENT: Negative.  Negative for congestion, ear pain, hearing loss, nosebleeds, sinus pain, sore throat and tinnitus.   Eyes: Negative.  Negative for blurred vision and double vision.  Respiratory: Negative.  Negative for cough, hemoptysis, sputum production and shortness of breath.   Cardiovascular: Negative.  Negative for chest pain, palpitations and leg swelling.  Gastrointestinal: Positive for diarrhea (chronic; on  Imodium; comes and goes). Negative for abdominal pain, blood in stool, constipation, heartburn, melena, nausea and vomiting.  Genitourinary: Negative for dysuria, frequency, hematuria and urgency.       Prolapsed bladder. Bladder leakage.  Musculoskeletal: Positive for joint pain (hips; can not walk long distances). Negative for back pain, myalgias and neck pain.  Skin: Negative.  Negative for itching and rash.  Neurological: Negative.  Negative for dizziness, tingling, sensory change, focal weakness, weakness and headaches.  Endo/Heme/Allergies: Negative.  Does not bruise/bleed easily.  Psychiatric/Behavioral: Negative for depression and memory loss. The patient is nervous/anxious. The patient does not have insomnia.   All other systems reviewed and are negative.  Performance status (ECOG):  1  Vitals Blood pressure (!) 215/67, pulse 70, temperature (!) 96.5 F (35.8 C), temperature source Tympanic, weight 136 lb 12.7 oz (62.1 kg), SpO2 100 %.   Physical Exam  Constitutional: She is oriented to person, place, and time. She appears well-developed and well-nourished. No distress.  HENT:  Head: Normocephalic and atraumatic.  Mouth/Throat: Oropharynx is clear and moist. No oropharyngeal exudate.  Short blonde hair.  Mask.  Eyes: Pupils are equal, round, and reactive to light. Conjunctivae and EOM are normal. No  scleral icterus.  Glasses.  Blue eyes.  Neck: No JVD present.  Cardiovascular: Normal rate, regular rhythm and normal heart sounds.  No murmur heard. Pulmonary/Chest: Effort normal and breath sounds normal. No respiratory distress. She has no wheezes. She has no rales. She exhibits no tenderness.  Abdominal: Soft. Bowel sounds are normal. She exhibits no distension and no mass. There is no abdominal tenderness. There is no rebound and no guarding.  Musculoskeletal:        General: No tenderness or edema.     Cervical back: Normal range of motion and neck supple.  Lymphadenopathy:       Head (right side): No preauricular, no posterior auricular and no occipital adenopathy present.       Head (left side): No preauricular, no posterior auricular and no occipital adenopathy present.    She has no cervical adenopathy.    She has no axillary adenopathy.       Right: No supraclavicular adenopathy present.       Left: No supraclavicular adenopathy present.  Neurological: She is alert and oriented to person, place, and time.  Skin: Skin is warm and dry. No rash noted. She is not diaphoretic. No erythema. No pallor.  Psychiatric: She has a normal mood and affect. Judgment and thought content normal.  Nursing note and vitals reviewed.   Appointment on 08/11/2019  Component Date Value Ref Range Status  . Creatinine, Ser 08/11/2019 3.65* 0.44 - 1.00 mg/dL Final  . GFR calc non Af Amer 08/11/2019 11* >60 mL/min Final  . GFR calc Af Amer 08/11/2019 13* >60 mL/min Final   Performed at [facility], [address] [place],  [address]  . WBC 08/11/2019 10.1  4.0 - 10.5 K/uL Final  . RBC 08/11/2019 3.33* 3.87 - 5.11 MIL/uL Final  . Hemoglobin 08/11/2019 10.0* 12.0 - 15.0 g/dL Final  . HCT 08/11/2019 30.4* 36.0 - 46.0 % Final  . MCV 08/11/2019 91.3  80.0 - 100.0 fL Final  . MCH 08/11/2019 30.0  26.0 - 34.0 pg Final  . MCHC 08/11/2019 32.9  30.0 - 36.0 g/dL Final  . RDW 08/11/2019  14.3  11.5 - 15.5 % Final  . Platelets 08/11/2019 296  150 - 400 K/uL Final  . nRBC 08/11/2019  0.0  0.0 - 0.2 % Final  . Neutrophils Relative % 08/11/2019 69  % Final  . Neutro Abs 08/11/2019 7.0  1.7 - 7.7 K/uL Final  . Lymphocytes Relative 08/11/2019 17  % Final  . Lymphs Abs 08/11/2019 1.7  0.7 - 4.0 K/uL Final  . Monocytes Relative 08/11/2019 7  % Final  . Monocytes Absolute 08/11/2019 0.7  0.1 - 1.0 K/uL Final  . Eosinophils Relative 08/11/2019 5  % Final  . Eosinophils Absolute 08/11/2019 0.5  0.0 - 0.5 K/uL Final  . Basophils Relative 08/11/2019 1  % Final  . Basophils Absolute 08/11/2019 0.1  0.0 - 0.1 K/uL Final  . Immature Granulocytes 08/11/2019 1  % Final  . Abs Immature Granulocytes 08/11/2019 0.07  0.00 - 0.07 K/uL Final   Performed at [facility], [address], [place], [place] [address]    Assessment:  Novi Calia is a 79 y.o. female with anemia of chronic renal disease.  She has chronic renal insufficiency.Creatinine was 2.7 (creatinine clearance 17 ml/min) on 09/07/2018.  Work-up on 08/07/2020revealed ahematocrit 33.3, hemoglobin 11.0, MCV 88.6, platelets 304,000,andWBC 7,400.Differential was normal.Ferritinwas135with an iron saturation of26% and aTIBC 315. Vitamin B12 was511. Folate >100.0. SPEPrevealed no M-spike. Kappa free light chains110.2, lambda free light chains 38.5.andratio 2.86(0.26-1.65).24 hour UPEP revealed 1384 mg/24 hoursof proteinwithout a monoclonal spike.  She began Aranesp 100 mcg on 12/16/2018 (last 06/30/2019).  Ferritin was 161 on 06/30/2019.  She has amonoclonal gammopathy or unknown significance(MGUS).Bone marrow biopsyon 02/04/2017 was sub-optimal.Congo red stains and abdominal wall fat pad aspirationwere negative.She was briefly treated with Velcade and Decadron beginning in 08/12/2017. Treatment was complicated by hyperglycemia and diarrhea. 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 There was very variably cellular marrow, overall 50% with trilineage hematopoiesis. There were abnormal plasma cells present at normal plasma cell background detected by flow cytometry. Findings wereconsistent with a low level of involvement of bone marrow with the patient's previously diagnosed plasma cell neoplasm.  24-hour UPEPon 12/28/2017 revealed 1966m/24hrsprotein with no M-spike and normal immunofixation. Kappa free light chains were 101, lambda free light chain 6.39 and a ratio of 15.81 (2.04 - 10.37).Whole body MRIon 02/04/2018 revealed no evidence of discrete focal dominant marrow lesion.   M-spike has been followed (gm/dL): 0 on 10/15/2018 and 0 on 04/21/2019. Kappa FLCA has been followed: 110.2 (ratio 2.86) on 10/15/2018 and 98.2 (ratio 2.21) on 04/21/2019.  She has a history of rectal cancers/p neoadjuvant chemotherapy followed by resection on 08/08/2009. Transrectalultrasound in 01/2011confirmed a 4 cm mass from the anal verge, encompassing 1/3 of the circumference of the rectum.Clinical stagewas T3N0. She received3 cycles of CI 5FU.There was noresidual carcinomaon resection. She underwent a restorative proctectomy with colonic J-pouch and coloanal anastomosis. Two lymph nodes werenegative. She declined adjuvant chemotherapy.Colonoscopyin 03/2017 showed no evidence of recurrence.CEAwas1.5on 10/15/2018.  Symptomatically, she feels tired with minimal activity.  She feels anxious and has white coat hypertension.  BP is 215/67.  Plan: 1.   Labs today: CBC with diff, creatinine. 2.   Anemia of chronic kidney disease Clinically, she appears to be doing well Hematocrit  3.4. Hemoglobin 10.0.              She received Aranesp on 06/30/2019. Continue Aranesp when hemoglobin is < 10.  She states that she feels best when her hemoglobin is  11. 3.Monoclonal gammopathy of unknown significance (MGUS) M-spike was 0 on 10/15/2018 and 04/21/2019. Free light chains  showed a slight excess of kappa free light chains. Monitor every 6 months. 4.Rectal cancer(2011) Patient patient denies any concerns. 5.   Hypertension  Blood pressure is 215/67.  Repeat blood pressure remains elevated.  Patient declines assessment in the ER.  She has whitecoat hypertension in addition to baseline HTN on lisinopril and nifedipine.  Discussed the importance of good blood pressure control for future Aranesp. 6.No Aranesp today 7.   RTC in 2 weeks for labs (HCT/Hgb) and +/- Aranesp. 8.   RTC in 6 weeks for labs (HCT/Hgb, ferritin) and +/- Aranesp. 9.   RTC in 10 weeks for labs (HCT/Hgb) and +/- Aranesp. 10.   RTC in 14 weeks for labs (HCT/Hgb) and +/- Aranesp. 11.   RTC in 18 weeks for MD assessment, labs (CBC with diff, Cr, ferritin, iron studies, SPEP, FLCA), and +/- Aranesp.  I discussed the assessment and treatment plan with the patient.  The patient was provided an opportunity to ask questions and all were answered.  The patient agreed with the plan and demonstrated an understanding of the instructions.  The patient was advised to call back if the symptoms worsen or if the condition fails to improve as anticipated.   Lequita Asal, MD, PhD    08/11/2019, 2:41 PM  I, Selena Batten, am acting as scribe for [employer]. Mike Gip, MD, PhD.  I, Hoover Grewe C. Mike Gip, MD, have reviewed the above documentation for accuracy and completeness, and I agree with the above.

## 2019-08-11 ENCOUNTER — Inpatient Hospital Stay: Payer: Medicare Other | Attending: Hematology and Oncology

## 2019-08-11 ENCOUNTER — Inpatient Hospital Stay: Payer: Medicare Other

## 2019-08-11 ENCOUNTER — Other Ambulatory Visit: Payer: Self-pay

## 2019-08-11 ENCOUNTER — Inpatient Hospital Stay (HOSPITAL_BASED_OUTPATIENT_CLINIC_OR_DEPARTMENT_OTHER): Payer: Medicare Other | Admitting: Hematology and Oncology

## 2019-08-11 VITALS — BP 215/67 | HR 70 | Temp 96.5°F | Wt 136.8 lb

## 2019-08-11 DIAGNOSIS — N362 Urethral caruncle: Secondary | ICD-10-CM | POA: Insufficient documentation

## 2019-08-11 DIAGNOSIS — Z85048 Personal history of other malignant neoplasm of rectum, rectosigmoid junction, and anus: Secondary | ICD-10-CM | POA: Diagnosis not present

## 2019-08-11 DIAGNOSIS — Z880 Allergy status to penicillin: Secondary | ICD-10-CM | POA: Insufficient documentation

## 2019-08-11 DIAGNOSIS — N184 Chronic kidney disease, stage 4 (severe): Secondary | ICD-10-CM | POA: Insufficient documentation

## 2019-08-11 DIAGNOSIS — D472 Monoclonal gammopathy: Secondary | ICD-10-CM

## 2019-08-11 DIAGNOSIS — M25552 Pain in left hip: Secondary | ICD-10-CM | POA: Diagnosis not present

## 2019-08-11 DIAGNOSIS — M25551 Pain in right hip: Secondary | ICD-10-CM | POA: Diagnosis not present

## 2019-08-11 DIAGNOSIS — D631 Anemia in chronic kidney disease: Secondary | ICD-10-CM

## 2019-08-11 DIAGNOSIS — Z79899 Other long term (current) drug therapy: Secondary | ICD-10-CM | POA: Insufficient documentation

## 2019-08-11 DIAGNOSIS — R197 Diarrhea, unspecified: Secondary | ICD-10-CM | POA: Diagnosis not present

## 2019-08-11 DIAGNOSIS — K5900 Constipation, unspecified: Secondary | ICD-10-CM | POA: Insufficient documentation

## 2019-08-11 LAB — CREATININE, SERUM
Creatinine, Ser: 3.65 mg/dL — ABNORMAL HIGH (ref 0.44–1.00)
GFR calc Af Amer: 13 mL/min — ABNORMAL LOW (ref >60)
GFR calc non Af Amer: 11 mL/min — ABNORMAL LOW (ref >60)

## 2019-08-11 LAB — CBC WITH DIFFERENTIAL/PLATELET
Abs Immature Granulocytes: 0.07 10*3/uL (ref 0.00–0.07)
Basophils Absolute: 0.1 10*3/uL (ref 0.0–0.1)
Basophils Relative: 1 %
Eosinophils Absolute: 0.5 10*3/uL (ref 0.0–0.5)
Eosinophils Relative: 5 %
HCT: 30.4 % — ABNORMAL LOW (ref 36.0–46.0)
Hemoglobin: 10 g/dL — ABNORMAL LOW (ref 12.0–15.0)
Immature Granulocytes: 1 %
Lymphocytes Relative: 17 %
Lymphs Abs: 1.7 10*3/uL (ref 0.7–4.0)
MCH: 30 pg (ref 26.0–34.0)
MCHC: 32.9 g/dL (ref 30.0–36.0)
MCV: 91.3 fL (ref 80.0–100.0)
Monocytes Absolute: 0.7 10*3/uL (ref 0.1–1.0)
Monocytes Relative: 7 %
Neutro Abs: 7 10*3/uL (ref 1.7–7.7)
Neutrophils Relative %: 69 %
Platelets: 296 10*3/uL (ref 150–400)
RBC: 3.33 MIL/uL — ABNORMAL LOW (ref 3.87–5.11)
RDW: 14.3 % (ref 11.5–15.5)
WBC: 10.1 10*3/uL (ref 4.0–10.5)
nRBC: 0 % (ref 0.0–0.2)

## 2019-08-25 ENCOUNTER — Inpatient Hospital Stay: Payer: Medicare Other

## 2019-08-25 ENCOUNTER — Other Ambulatory Visit: Payer: Self-pay

## 2019-08-25 VITALS — BP 133/48 | HR 73 | Temp 97.0°F | Resp 18

## 2019-08-25 DIAGNOSIS — N184 Chronic kidney disease, stage 4 (severe): Secondary | ICD-10-CM | POA: Diagnosis not present

## 2019-08-25 DIAGNOSIS — D631 Anemia in chronic kidney disease: Secondary | ICD-10-CM

## 2019-08-25 LAB — HEMOGLOBIN AND HEMATOCRIT, BLOOD
HCT: 30.3 % — ABNORMAL LOW (ref 36.0–46.0)
Hemoglobin: 9.8 g/dL — ABNORMAL LOW (ref 12.0–15.0)

## 2019-08-25 MED ORDER — DARBEPOETIN ALFA 100 MCG/0.5ML IJ SOSY
100.0000 ug | PREFILLED_SYRINGE | Freq: Once | INTRAMUSCULAR | Status: AC
  Administered 2019-08-25: 100 ug via SUBCUTANEOUS
  Filled 2019-08-25: qty 0.5

## 2019-09-22 ENCOUNTER — Inpatient Hospital Stay: Payer: Medicare Other | Attending: Hematology and Oncology

## 2019-09-22 ENCOUNTER — Other Ambulatory Visit: Payer: Self-pay

## 2019-09-22 ENCOUNTER — Inpatient Hospital Stay: Payer: Medicare Other

## 2019-09-22 VITALS — BP 159/70 | HR 72 | Temp 98.1°F | Resp 18

## 2019-09-22 DIAGNOSIS — I129 Hypertensive chronic kidney disease with stage 1 through stage 4 chronic kidney disease, or unspecified chronic kidney disease: Secondary | ICD-10-CM | POA: Diagnosis not present

## 2019-09-22 DIAGNOSIS — N184 Chronic kidney disease, stage 4 (severe): Secondary | ICD-10-CM | POA: Diagnosis present

## 2019-09-22 DIAGNOSIS — D631 Anemia in chronic kidney disease: Secondary | ICD-10-CM | POA: Insufficient documentation

## 2019-09-22 DIAGNOSIS — R197 Diarrhea, unspecified: Secondary | ICD-10-CM | POA: Diagnosis not present

## 2019-09-22 DIAGNOSIS — D472 Monoclonal gammopathy: Secondary | ICD-10-CM | POA: Insufficient documentation

## 2019-09-22 DIAGNOSIS — Z79899 Other long term (current) drug therapy: Secondary | ICD-10-CM | POA: Insufficient documentation

## 2019-09-22 DIAGNOSIS — Z85048 Personal history of other malignant neoplasm of rectum, rectosigmoid junction, and anus: Secondary | ICD-10-CM | POA: Diagnosis not present

## 2019-09-22 DIAGNOSIS — M2550 Pain in unspecified joint: Secondary | ICD-10-CM | POA: Diagnosis not present

## 2019-09-22 LAB — HEMOGLOBIN AND HEMATOCRIT, BLOOD
HCT: 28.2 % — ABNORMAL LOW (ref 36.0–46.0)
Hemoglobin: 9.3 g/dL — ABNORMAL LOW (ref 12.0–15.0)

## 2019-09-22 MED ORDER — DARBEPOETIN ALFA 100 MCG/0.5ML IJ SOSY
100.0000 ug | PREFILLED_SYRINGE | Freq: Once | INTRAMUSCULAR | Status: AC
  Administered 2019-09-22: 100 ug via SUBCUTANEOUS
  Filled 2019-09-22: qty 0.5

## 2019-10-07 DIAGNOSIS — Z9189 Other specified personal risk factors, not elsewhere classified: Secondary | ICD-10-CM | POA: Insufficient documentation

## 2019-10-13 DIAGNOSIS — E113313 Type 2 diabetes mellitus with moderate nonproliferative diabetic retinopathy with macular edema, bilateral: Secondary | ICD-10-CM | POA: Insufficient documentation

## 2019-10-13 DIAGNOSIS — H35033 Hypertensive retinopathy, bilateral: Secondary | ICD-10-CM | POA: Insufficient documentation

## 2019-10-20 ENCOUNTER — Other Ambulatory Visit: Payer: Self-pay

## 2019-10-20 ENCOUNTER — Inpatient Hospital Stay: Payer: Medicare Other | Attending: Hematology and Oncology

## 2019-10-20 ENCOUNTER — Inpatient Hospital Stay: Payer: Medicare Other

## 2019-10-20 VITALS — BP 149/68 | HR 60 | Temp 97.8°F | Resp 16

## 2019-10-20 DIAGNOSIS — N184 Chronic kidney disease, stage 4 (severe): Secondary | ICD-10-CM | POA: Insufficient documentation

## 2019-10-20 DIAGNOSIS — D631 Anemia in chronic kidney disease: Secondary | ICD-10-CM

## 2019-10-20 DIAGNOSIS — M2550 Pain in unspecified joint: Secondary | ICD-10-CM | POA: Diagnosis not present

## 2019-10-20 DIAGNOSIS — Z923 Personal history of irradiation: Secondary | ICD-10-CM | POA: Diagnosis not present

## 2019-10-20 DIAGNOSIS — Z85048 Personal history of other malignant neoplasm of rectum, rectosigmoid junction, and anus: Secondary | ICD-10-CM | POA: Diagnosis not present

## 2019-10-20 DIAGNOSIS — I129 Hypertensive chronic kidney disease with stage 1 through stage 4 chronic kidney disease, or unspecified chronic kidney disease: Secondary | ICD-10-CM | POA: Insufficient documentation

## 2019-10-20 DIAGNOSIS — D472 Monoclonal gammopathy: Secondary | ICD-10-CM | POA: Diagnosis not present

## 2019-10-20 DIAGNOSIS — Z79899 Other long term (current) drug therapy: Secondary | ICD-10-CM | POA: Diagnosis not present

## 2019-10-20 DIAGNOSIS — Z9221 Personal history of antineoplastic chemotherapy: Secondary | ICD-10-CM | POA: Diagnosis not present

## 2019-10-20 LAB — HEMOGLOBIN AND HEMATOCRIT, BLOOD
HCT: 29.8 % — ABNORMAL LOW (ref 36.0–46.0)
Hemoglobin: 9.3 g/dL — ABNORMAL LOW (ref 12.0–15.0)

## 2019-10-20 MED ORDER — DARBEPOETIN ALFA 100 MCG/0.5ML IJ SOSY
100.0000 ug | PREFILLED_SYRINGE | Freq: Once | INTRAMUSCULAR | Status: AC
  Administered 2019-10-20: 100 ug via SUBCUTANEOUS

## 2019-11-03 DIAGNOSIS — I77810 Thoracic aortic ectasia: Secondary | ICD-10-CM

## 2019-11-03 HISTORY — DX: Thoracic aortic ectasia: I77.810

## 2019-11-17 ENCOUNTER — Inpatient Hospital Stay: Payer: Medicare Other | Attending: Hematology and Oncology

## 2019-11-17 ENCOUNTER — Other Ambulatory Visit: Payer: Self-pay

## 2019-11-17 ENCOUNTER — Inpatient Hospital Stay: Payer: Medicare Other

## 2019-11-17 VITALS — BP 149/65 | HR 71 | Temp 96.9°F | Resp 16

## 2019-11-17 DIAGNOSIS — N184 Chronic kidney disease, stage 4 (severe): Secondary | ICD-10-CM | POA: Diagnosis present

## 2019-11-17 DIAGNOSIS — D631 Anemia in chronic kidney disease: Secondary | ICD-10-CM

## 2019-11-17 DIAGNOSIS — R197 Diarrhea, unspecified: Secondary | ICD-10-CM | POA: Insufficient documentation

## 2019-11-17 DIAGNOSIS — D472 Monoclonal gammopathy: Secondary | ICD-10-CM | POA: Diagnosis not present

## 2019-11-17 DIAGNOSIS — Z85048 Personal history of other malignant neoplasm of rectum, rectosigmoid junction, and anus: Secondary | ICD-10-CM | POA: Diagnosis not present

## 2019-11-17 DIAGNOSIS — I129 Hypertensive chronic kidney disease with stage 1 through stage 4 chronic kidney disease, or unspecified chronic kidney disease: Secondary | ICD-10-CM | POA: Insufficient documentation

## 2019-11-17 DIAGNOSIS — M2550 Pain in unspecified joint: Secondary | ICD-10-CM | POA: Insufficient documentation

## 2019-11-17 DIAGNOSIS — Z79899 Other long term (current) drug therapy: Secondary | ICD-10-CM | POA: Diagnosis not present

## 2019-11-17 LAB — HEMOGLOBIN AND HEMATOCRIT, BLOOD
HCT: 30.7 % — ABNORMAL LOW (ref 36.0–46.0)
Hemoglobin: 9.7 g/dL — ABNORMAL LOW (ref 12.0–15.0)

## 2019-11-17 MED ORDER — DARBEPOETIN ALFA 100 MCG/0.5ML IJ SOSY
100.0000 ug | PREFILLED_SYRINGE | Freq: Once | INTRAMUSCULAR | Status: AC
  Administered 2019-11-17: 100 ug via SUBCUTANEOUS

## 2019-12-15 ENCOUNTER — Ambulatory Visit: Payer: Medicare Other | Admitting: Hematology and Oncology

## 2019-12-15 ENCOUNTER — Ambulatory Visit: Payer: Medicare Other

## 2019-12-15 ENCOUNTER — Other Ambulatory Visit: Payer: Medicare Other

## 2019-12-20 ENCOUNTER — Other Ambulatory Visit: Payer: Self-pay

## 2019-12-20 DIAGNOSIS — N184 Chronic kidney disease, stage 4 (severe): Secondary | ICD-10-CM

## 2019-12-20 DIAGNOSIS — D631 Anemia in chronic kidney disease: Secondary | ICD-10-CM

## 2019-12-21 NOTE — Progress Notes (Signed)
[facility]  [address] [place], [place] [address] Phone: [phone]  Fax: [phone]   Clinic Day:  12/22/2019  Referring physician: Gayland Curry, MD  Chief Complaint: Misty [name] is a 79 y.o. female with anemia of chronic kidney disease who is seen for 4 month assessment.  HPI: The patient was last seen in the medical oncology clinic on 08/11/2019. At that time, she felt tired with minimal activity.  She felt anxious and had white coat hypertension. BP was 215/67. Hematocrit was 30.4, hemoglobin 10.0, MCV 91.3, platelets 296,000, WBC 10,100. Creatinine was 3.65 (CrCl 11 ml/min).  The patient underwent cystourethroscopy, transurethral injection of Coaptite urethral bulking agent and excision of distal urethral polyp on 10/28/2019 by Dr. Linus Mako.  Pathology revealed a minute polypoid fragment of benign squamous mucosa with chronic inflammation.  The patient saw Ferdinand Cava, NP (nephrology) on 11/03/2019. She had early uremic symptoms but there was no need for acute dialysis at that time. The patient and her husband were interested in a PD catheter. It was recommended that her Aranesp dose be increased to 150 mcg monthly to reach a hemoglobin goal of 11.0.  Labs followed: 08/25/2019: Hematocrit 30.3. Hemoglobin 9.8. 09/22/2019: Hematocrit 28.2. Hemoglobin 9.3. 10/20/2019: Hematocrit 29.8. Hemoglobin 9.3. 11/17/2019: Hematocrit 30.7. Hemoglobin 9.7.  [facility] labs on 10/07/2019: Ferritin 132. Iron saturation 25%. TIBC 296. Reticulocyte count 2.56%. Vitamin B12 549. Folate >45.0.  The patient received Aranesp monthly x 4 (08/25/2019 - 11/17/2019).  During the interim, she has been good. She states that she starts to feel less tired about a week after her shots. Her diarrhea and hip pain are stable. She is not feeling anxious today.  She is disappointed that her bladder surgery did not work. She is seeing her gynecologist next week and  they are planning to try Botox.  She has mixed feelings about pursuing peritoneal dialysis (PD) and has not made a decision yet.  She is going to see her GI physician on 12/27/2019.    Past Medical History:  Diagnosis Date  . Anemia   . Cancer [facility]) 2011   rectal ca/chemo /rad Lenore Manner  . Chronic kidney disease   . Diabetes mellitus without complication ([place])    type 2  . Hypertension   . Personal history of chemotherapy   . Personal history of radiation therapy     Past Surgical History:  Procedure Laterality Date  . BREAST BIOPSY Left    bx/ clip-neg  . coloretcal cancer    . thyroid gland     surgery / unknown date    Family History  Problem Relation Age of Onset  . Breast cancer Neg Hx     Social History:  reports that she has never smoked. She has never used smokeless tobacco. She reports that she does not drink alcohol. No history on file for drug use. She is originally from [place] but moved to [place] in 1972.[place].She denies any alcohol or tobacco use. She denies any exposure to radiation or toxins. 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 The patient is alone today.  Allergies:  Allergies  Allergen Reactions  . Penicillins Rash    Current Medications: Current Outpatient Medications  Medication Sig Dispense Refill  . acetaminophen (TYLENOL) 325 MG tablet Take 325 mg by mouth every 4 (four) hours as needed.     Marland Kitchen atorvastatin (LIPITOR) 40 MG  tablet Take 40 mg by mouth daily at 6 PM.     . B Complex-C-Folic Acid (RENAL) 1 MG CAPS TK 1 C PO D    . carvedilol (COREG) 3.125 MG tablet Take 3.125 mg by mouth 2 (two) times daily with a meal.     . Cetirizine HCl 10 MG CAPS Take 1 capsule by mouth daily.     . clopidogrel (PLAVIX) 75 MG tablet Take 75 mg by mouth daily.     . colestipol (COLESTID) 1 g tablet Take 1 g by mouth 2 (two) times daily.     .  ferrous sulfate 325 (65 FE) MG tablet Take 325 mg by mouth 3 (three) times a week.     . Fiber Diet TABS Take 2 tablets by mouth 2 (two) times daily.     Marland Kitchen gabapentin (NEURONTIN) 300 MG capsule Take 300 mg by mouth 2 (two) times daily.     Marland Kitchen glimepiride (AMARYL) 2 MG tablet Take 2 mg by mouth daily with breakfast.     . glucose blood test strip 1 each by Finger Stick route 4 times daily (after meals and nightly). Use as directed    . HUMALOG 100 UNIT/ML injection Inject into the skin as directed. Sliding Scale    . hydrALAZINE (APRESOLINE) 25 MG tablet Take 25 mg by mouth daily.     . insulin glargine (LANTUS) 100 UNIT/ML injection Inject 35 Units into the skin 2 (two) times daily.     . INSULIN SYRINGE 1CC/29G 29G X 1/2" 1 ML MISC     . lisinopril (ZESTRIL) 10 MG tablet Take 10 mg by mouth daily.     Marland Kitchen NIFEdipine (ADALAT CC) 60 MG 24 hr tablet Take 60 mg by mouth daily.     Haig Prophet COMFORT PEN NEEDLES 31G X 8 MM MISC     . latanoprost (XALATAN) 0.005 % ophthalmic solution Place 1 drop into the left eye at bedtime.  (Patient not taking: Reported on 12/22/2019)    . lidocaine (LIDODERM) 5 % Place onto the skin.     No current facility-administered medications for this visit.    Review of Systems  Constitutional: Positive for weight loss (up 1 lb). Negative for chills, diaphoresis, fever and malaise/fatigue (energy improved).  HENT: Negative.  Negative for congestion, ear discharge, ear pain, hearing loss, nosebleeds, sinus pain, sore throat and tinnitus.   Eyes: Negative.  Negative for blurred vision and double vision.  Respiratory: Negative.  Negative for cough, hemoptysis, sputum production and shortness of breath.   Cardiovascular: Negative.  Negative for chest pain, palpitations and leg swelling.  Gastrointestinal: Positive for diarrhea (chronic; on Imodium; comes and goes). Negative for abdominal pain, blood in stool, constipation, heartburn, melena, nausea and vomiting.  Genitourinary:  Negative for dysuria, frequency, hematuria and urgency.  Musculoskeletal: Positive for joint pain (hips; can not walk long distances). Negative for back pain, myalgias and neck pain.  Skin: Negative.  Negative for itching and rash.  Neurological: Negative.  Negative for dizziness, tingling, sensory change, focal weakness, weakness and headaches.  Endo/Heme/Allergies: Negative.  Does not bruise/bleed easily.  Psychiatric/Behavioral: Negative for depression and memory loss. The patient is not nervous/anxious and does not have insomnia.   All other systems reviewed and are negative.  Performance status (ECOG):  1  Vitals Blood pressure (!) 154/49, pulse 71, temperature (!) 97.3 F (36.3 C), temperature source Tympanic, resp. rate 17, weight 137 lb 2 oz (62.2 kg), SpO2 98 %.  Physical Exam Vitals and nursing note reviewed.  Constitutional:      General: She is not in acute distress.    Appearance: She is well-developed. She is not diaphoretic.  HENT:     Head: Normocephalic and atraumatic.     Mouth/Throat:     Mouth: Mucous membranes are moist.     Pharynx: Oropharynx is clear.  Eyes:     General: No scleral icterus.    Pupils: Pupils are equal, round, and reactive to light.     Comments: Glasses.  Blue eyes. Left scleral hemorrhage.  Neck:     Vascular: No JVD.  Cardiovascular:     Rate and Rhythm: Normal rate and regular rhythm.     Heart sounds: Murmur heard.   Pulmonary:     Effort: Pulmonary effort is normal. No respiratory distress.     Breath sounds: Normal breath sounds. No wheezing or rales.  Chest:     Chest wall: No tenderness.  Abdominal:     General: Bowel sounds are normal. There is no distension.     Palpations: Abdomen is soft. There is no mass.     Tenderness: There is no abdominal tenderness. There is no guarding or rebound.  Musculoskeletal:        General: No tenderness.     Cervical back: Normal range of motion and neck supple.  Lymphadenopathy:      Head:     Right side of head: No preauricular, posterior auricular or occipital adenopathy.     Left side of head: No preauricular, posterior auricular or occipital adenopathy.     Cervical: No cervical adenopathy.     Upper Body:     Right upper body: No supraclavicular adenopathy.     Left upper body: No supraclavicular adenopathy.  Skin:    General: Skin is warm and dry.     Coloration: Skin is not pale.     Findings: No erythema or rash.  Neurological:     Mental Status: She is alert and oriented to person, place, and time.  Psychiatric:        Behavior: Behavior normal.        Thought Content: Thought content normal.        Judgment: Judgment normal.    Appointment on 12/22/2019  Component Date Value Ref Range Status  . Creatinine, Ser 12/22/2019 3.99* 0.44 - 1.00 mg/dL Final  . GFR, Estimated 12/22/2019 10* >60 mL/min Final   Performed at [facility], [address] [place], [place] [address]  . WBC 12/22/2019 7.2  4.0 - 10.5 K/uL Final  . RBC 12/22/2019 3.44* 3.87 - 5.11 MIL/uL Final  . Hemoglobin 12/22/2019 10.0* 12.0 - 15.0 g/dL Final  . HCT 12/22/2019 30.6* 36 - 46 % Final  . MCV 12/22/2019 89.0  80.0 - 100.0 fL Final  . MCH 12/22/2019 29.1  26.0 - 34.0 pg Final  . MCHC 12/22/2019 32.7  30.0 - 36.0 g/dL Final  . RDW 12/22/2019 15.1  11.5 - 15.5 % Final  . Platelets 12/22/2019 254  150 - 400 K/uL Final  . nRBC 12/22/2019 0.0  0.0 - 0.2 % Final  . Neutrophils Relative % 12/22/2019 70  % Final  . Neutro Abs 12/22/2019 5.2  1.7 - 7.7 K/uL Final  . Lymphocytes Relative 12/22/2019 16  % Final  . Lymphs Abs 12/22/2019 1.1  0.7 - 4.0 K/uL Final  . Monocytes Relative 12/22/2019 6  % Final  . Monocytes Absolute 12/22/2019   0.4  0.1 - 1.0 K/uL Final  . Eosinophils Relative 12/22/2019 6  % Final  . Eosinophils Absolute 12/22/2019 0.4  0.0 - 0.5 K/uL Final  . Basophils Relative 12/22/2019 1  % Final  . Basophils Absolute 12/22/2019 0.1  0.0 - 0.1 K/uL Final  .  Immature Granulocytes 12/22/2019 1  % Final  . Abs Immature Granulocytes 12/22/2019 0.04  0.00 - 0.07 K/uL Final   Performed at [place] [facility], [address] [place], [place] [address]    Assessment:  Meliah Dyment is a 78 y.o. female with anemia of chronic renal disease.  She has chronic renal insufficiency.Creatinine was 2.7 (creatinine clearance 17 ml/min) on 09/07/2018.  Work-up on 08/07/2020revealed ahematocrit 33.3, hemoglobin 11.0, MCV 88.6, platelets 304,000,andWBC 7,400.Differential was normal.Ferritinwas135with an iron saturation of26% and aTIBC 315. Vitamin B12 was511. Folate >100.0. SPEPrevealed no M-spike. Kappa free light chains110.2, lambda free light chains 38.5.andratio 2.86(0.26-1.65).24 hour UPEP revealed 1384 mg/24 hoursof proteinwithout a monoclonal spike.  She began Aranesp 100 mcg on 12/16/2018 (last 0 11/17/2019).  Ferritin was 161 on 06/30/2019 and 169 on 12/22/2019.  She has amonoclonal gammopathy or unknown significance(MGUS).Bone marrow biopsyon 02/04/2017 was sub-optimal.Congo red stains and abdominal wall fat pad aspirationwere negative.She was briefly treated with Velcade and Decadron beginning in 08/12/2017. Treatment was complicated by hyperglycemia and diarrhea. 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 There was very variably cellular marrow, overall 50% with trilineage hematopoiesis. There were abnormal plasma cells present at normal plasma cell background detected by flow cytometry. Findings wereconsistent with a low level of involvement of bone marrow with the patient's previously diagnosed plasma cell neoplasm.  24-hour UPEPon 12/28/2017 revealed 1911mg/24hrsprotein with no M-spike and normal immunofixation. Kappa free light chains were 101, lambda free light chain 6.39 and a ratio of 15.81 (2.04 - 10.37).Whole body  MRIon 02/04/2018 revealed no evidence of discrete focal dominant marrow lesion.   M-spike has been followed (gm/dL): 0 on 10/15/2018 and 0 on 04/21/2019. Kappa FLCA has been followed: 110.2 (ratio 2.86) on 10/15/2018 and 98.2 (ratio 2.21) on 04/21/2019.  She has a history of rectal cancers/p neoadjuvant chemotherapy followed by resection on 08/08/2009. Transrectalultrasound in 01/2011confirmed a 4 cm mass from the anal verge, encompassing 1/3 of the circumference of the rectum.Clinical stagewas T3N0. She received3 cycles of CI 5FU.There was noresidual carcinomaon resection. She underwent a restorative proctectomy with colonic J-pouch and coloanal anastomosis. Two lymph nodes werenegative. She declined adjuvant chemotherapy.Colonoscopyin 03/2017 showed no evidence of recurrence.CEAwas1.5on 10/15/2018.  Symptomatically, she starts to feel less tired about a week after her Aranesp.  Exam is stable.  Plan: 1.   Labs today: CBC with diff, Cr, ferritin, iron studies, SPEP, FLCA 2.   Anemia of chronic kidney disease Clinically, she is doing fairly well. Hematocrit  30.6. Hemoglobin 10.0.              She receive receives Aranesp monthly (last 11/17/2019).  Discuss plan to increase dose.  Patient in agreement.  Increase Aranesp to 150 mcg monthly.  She feels best when her hemoglobin is 11. 3.Monoclonal gammopathy of unknown significance (MGUS) M-spike was 0 on 10/15/2018 and 04/21/2019. Free light chains showed a slight excess of kappa free light chains. Monitor every 6-12 months. 4.Rectal cancer(2011) Patient has no concerns. 5.   Aranesp 150 mcg SQ today. 6.   RTC every 4 weeks x 3 for labs (HCT/Hgb) and +/- Aranesp. 7.   RTC in 16 weeks   for MD assessment, labs (CBC with diff, Cr, ferritin, iron studies, SPEP), and +/- Aranesp.  I discussed the assessment and treatment plan with  the patient.  The patient was provided an opportunity to ask questions and all were answered.  The patient agreed with the plan and demonstrated an understanding of the instructions.  The patient was advised to call back if the symptoms worsen or if the condition fails to improve as anticipated.   Lequita Asal, MD, PhD    12/22/2019, 11:32 AM  I, Mirian Mo Tufford, am acting as Education administrator for [employer]. Mike Gip, MD, PhD.  I, Orla Jolliff C. Mike Gip, MD, have reviewed the above documentation for accuracy and completeness, and I agree with the above.

## 2019-12-22 ENCOUNTER — Inpatient Hospital Stay: Payer: Medicare Other | Attending: Hematology and Oncology

## 2019-12-22 ENCOUNTER — Inpatient Hospital Stay (HOSPITAL_BASED_OUTPATIENT_CLINIC_OR_DEPARTMENT_OTHER): Payer: Medicare Other | Admitting: Hematology and Oncology

## 2019-12-22 ENCOUNTER — Encounter: Payer: Self-pay | Admitting: Hematology and Oncology

## 2019-12-22 ENCOUNTER — Inpatient Hospital Stay: Payer: Medicare Other

## 2019-12-22 ENCOUNTER — Other Ambulatory Visit: Payer: Self-pay | Admitting: Hematology and Oncology

## 2019-12-22 ENCOUNTER — Other Ambulatory Visit: Payer: Self-pay

## 2019-12-22 VITALS — BP 154/49 | HR 71 | Temp 97.3°F | Resp 17 | Wt 137.1 lb

## 2019-12-22 DIAGNOSIS — N184 Chronic kidney disease, stage 4 (severe): Secondary | ICD-10-CM | POA: Insufficient documentation

## 2019-12-22 DIAGNOSIS — D472 Monoclonal gammopathy: Secondary | ICD-10-CM

## 2019-12-22 DIAGNOSIS — D631 Anemia in chronic kidney disease: Secondary | ICD-10-CM

## 2019-12-22 LAB — CBC WITH DIFFERENTIAL/PLATELET
Abs Immature Granulocytes: 0.04 10*3/uL (ref 0.00–0.07)
Basophils Absolute: 0.1 10*3/uL (ref 0.0–0.1)
Basophils Relative: 1 %
Eosinophils Absolute: 0.4 10*3/uL (ref 0.0–0.5)
Eosinophils Relative: 6 %
HCT: 30.6 % — ABNORMAL LOW (ref 36.0–46.0)
Hemoglobin: 10 g/dL — ABNORMAL LOW (ref 12.0–15.0)
Immature Granulocytes: 1 %
Lymphocytes Relative: 16 %
Lymphs Abs: 1.1 10*3/uL (ref 0.7–4.0)
MCH: 29.1 pg (ref 26.0–34.0)
MCHC: 32.7 g/dL (ref 30.0–36.0)
MCV: 89 fL (ref 80.0–100.0)
Monocytes Absolute: 0.4 10*3/uL (ref 0.1–1.0)
Monocytes Relative: 6 %
Neutro Abs: 5.2 10*3/uL (ref 1.7–7.7)
Neutrophils Relative %: 70 %
Platelets: 254 10*3/uL (ref 150–400)
RBC: 3.44 MIL/uL — ABNORMAL LOW (ref 3.87–5.11)
RDW: 15.1 % (ref 11.5–15.5)
WBC: 7.2 10*3/uL (ref 4.0–10.5)
nRBC: 0 % (ref 0.0–0.2)

## 2019-12-22 LAB — IRON AND TIBC
Iron: 75 ug/dL (ref 28–170)
Saturation Ratios: 26 % (ref 10.4–31.8)
TIBC: 290 ug/dL (ref 250–450)
UIBC: 215 ug/dL

## 2019-12-22 LAB — FERRITIN: Ferritin: 169 ng/mL (ref 11–307)

## 2019-12-22 LAB — CREATININE, SERUM
Creatinine, Ser: 3.99 mg/dL — ABNORMAL HIGH (ref 0.44–1.00)
GFR, Estimated: 10 mL/min — ABNORMAL LOW (ref >60)

## 2019-12-22 MED ORDER — DARBEPOETIN ALFA 150 MCG/0.3ML IJ SOSY
150.0000 ug | PREFILLED_SYRINGE | Freq: Once | INTRAMUSCULAR | Status: AC
  Administered 2019-12-22: 150 ug via SUBCUTANEOUS

## 2019-12-22 NOTE — Progress Notes (Signed)
No new issues or concerns reported today.

## 2019-12-23 LAB — KAPPA/LAMBDA LIGHT CHAINS
Kappa free light chain: 124.6 mg/L — ABNORMAL HIGH (ref 3.3–19.4)
Kappa, lambda light chain ratio: 2.46 — ABNORMAL HIGH (ref 0.26–1.65)
Lambda free light chains: 50.6 mg/L — ABNORMAL HIGH (ref 5.7–26.3)

## 2019-12-26 LAB — PROTEIN ELECTROPHORESIS, SERUM
A/G Ratio: 1.1 (ref 0.7–1.7)
Albumin ELP: 3.7 g/dL (ref 2.9–4.4)
Alpha-1-Globulin: 0.2 g/dL (ref 0.0–0.4)
Alpha-2-Globulin: 1.1 g/dL — ABNORMAL HIGH (ref 0.4–1.0)
Beta Globulin: 1 g/dL (ref 0.7–1.3)
Gamma Globulin: 1 g/dL (ref 0.4–1.8)
Globulin, Total: 3.3 g/dL (ref 2.2–3.9)
Total Protein ELP: 7 g/dL (ref 6.0–8.5)

## 2020-01-16 ENCOUNTER — Ambulatory Visit
Admission: RE | Admit: 2020-01-16 | Discharge: 2020-01-16 | Disposition: A | Discharge status: Home / Self Care | Payer: Medicare Other | Source: Ambulatory Visit | Attending: Physical Medicine & Rehabilitation | Admitting: Physical Medicine & Rehabilitation

## 2020-01-16 ENCOUNTER — Other Ambulatory Visit (HOSPITAL_COMMUNITY): Payer: Self-pay | Admitting: Physical Medicine & Rehabilitation

## 2020-01-16 ENCOUNTER — Other Ambulatory Visit: Payer: Self-pay

## 2020-01-16 ENCOUNTER — Other Ambulatory Visit: Payer: Self-pay | Admitting: Physical Medicine & Rehabilitation

## 2020-01-16 DIAGNOSIS — G8929 Other chronic pain: Secondary | ICD-10-CM | POA: Diagnosis present

## 2020-01-16 DIAGNOSIS — M5441 Lumbago with sciatica, right side: Secondary | ICD-10-CM | POA: Insufficient documentation

## 2020-01-16 IMAGING — MR MR LUMBAR SPINE W/O CM
5 series · 31 of 48 positions shown · non-contrast
Comparison: None.

CLINICAL DATA: Chronic right-sided low back pain with right
sciatica

EXAM:
MRI LUMBAR SPINE WITHOUT CONTRAST
TECHNIQUE: Multiplanar, multisequence MR imaging of the lumbar spine was
performed. No intravenous contrast was administered.

[Series 6: T2 · sagittal · 4.0mm · 0.81mm/px · 6 of 17 slices shown (1 of 2)]
[im 1/17]
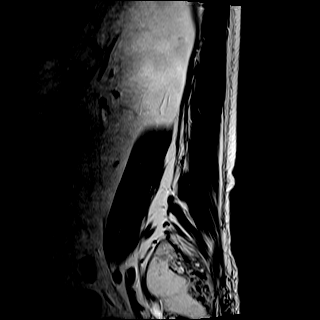
[im 4/17]
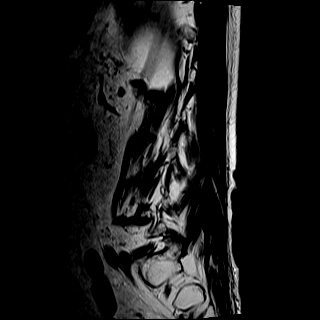
[im 7/17]
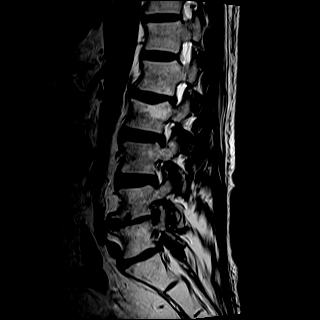
[im 10/17]
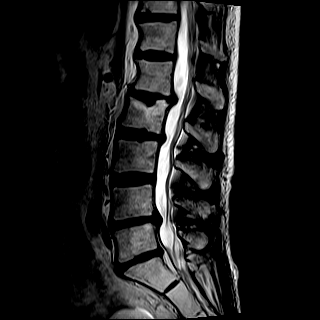
[im 13/17]
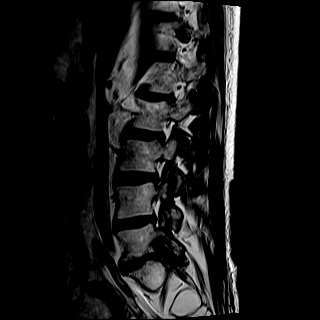
[im 17/17]
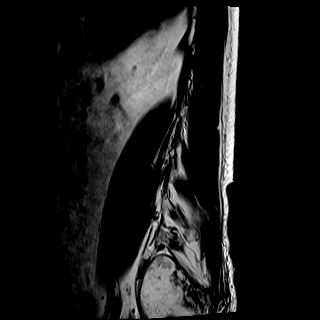

[Series 7: T1 · sagittal · 4.0mm · 0.81mm/px · 7 of 17 slices shown (1 of 2)]
[im 1/17]
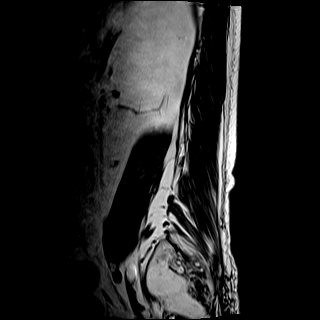
[im 3/17]
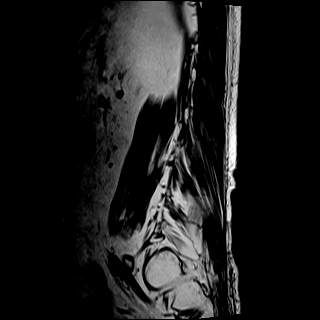
[im 6/17]
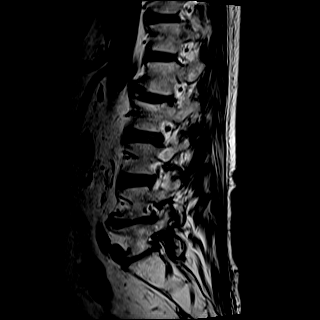
[im 9/17]
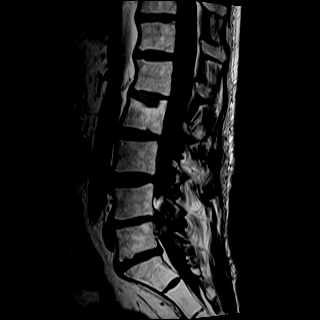
[im 11/17]
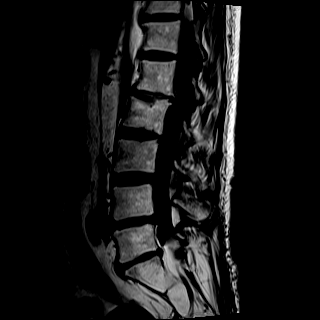
[im 14/17]
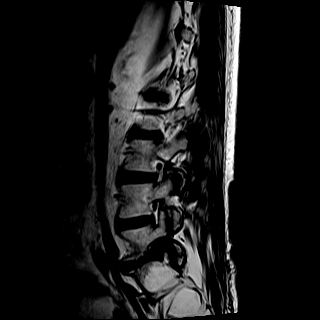
[im 17/17]
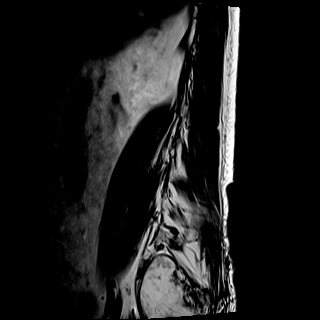

[Series 8: STIR · sagittal · 4.0mm · 0.41mm/px · 2 of 17 slices shown]
[im 1/17]
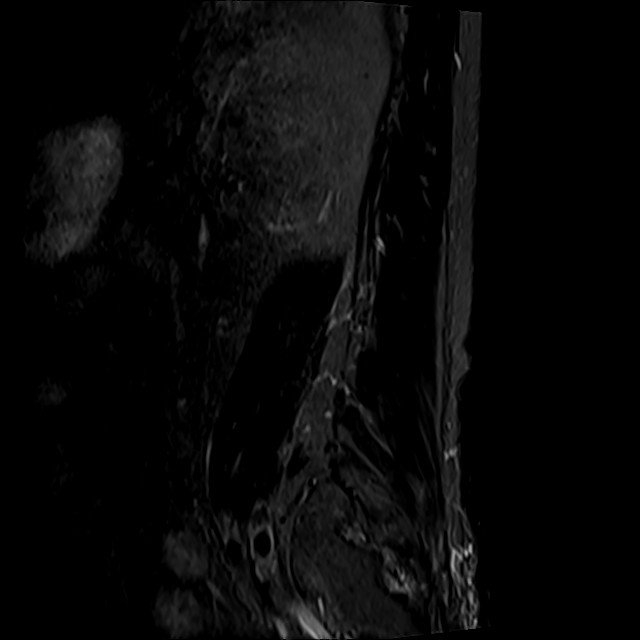
[im 3/17]
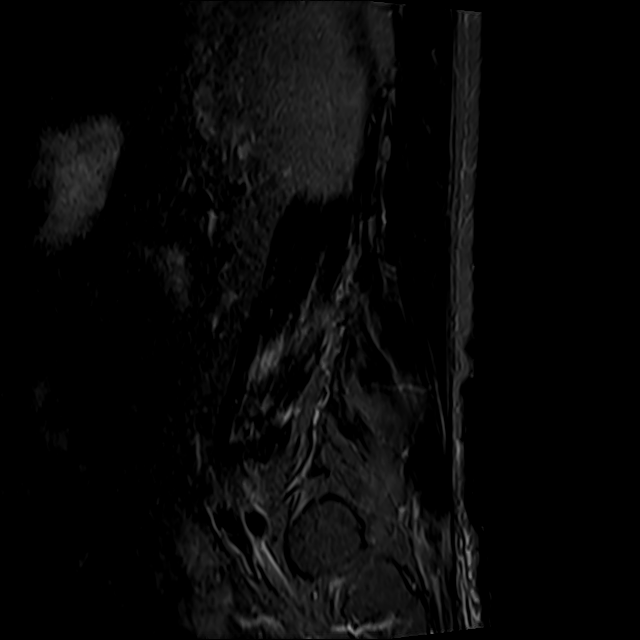

[Series 9: T2 · axial · 4.0mm · 0.78mm/px · z∈[-56,+158]mm · 8 of 36 slices shown (2 of 2)]
[im 1/36]
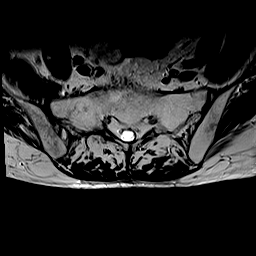
[im 6/36]
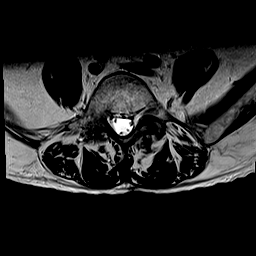
[im 11/36]
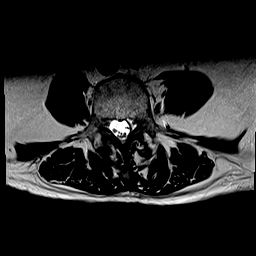
[im 17/36]
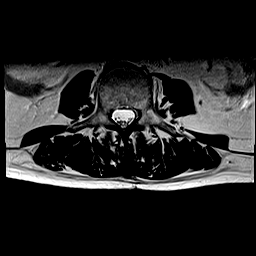
[im 19/36]
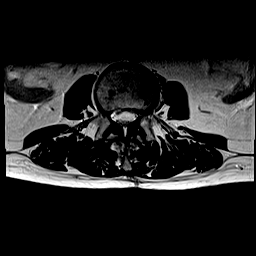
[im 25/36]
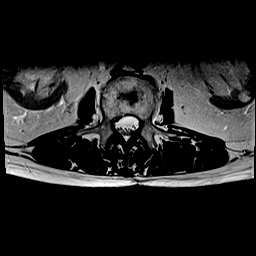
[im 30/36]
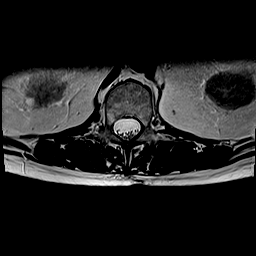
[im 36/36]
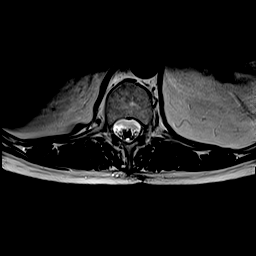

[Series 10: T1 · axial · 4.0mm · 0.39mm/px · z∈[-56,+158]mm · 8 of 36 slices shown (2 of 2)]
[im 1/36]
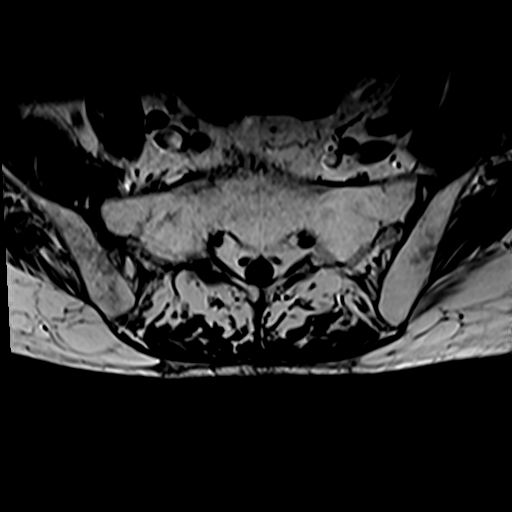
[im 6/36]
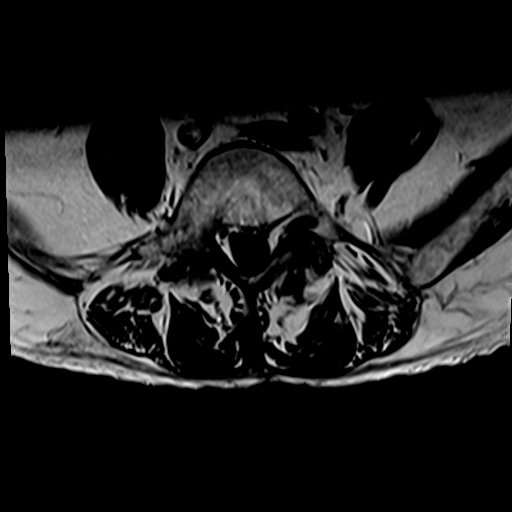
[im 11/36]
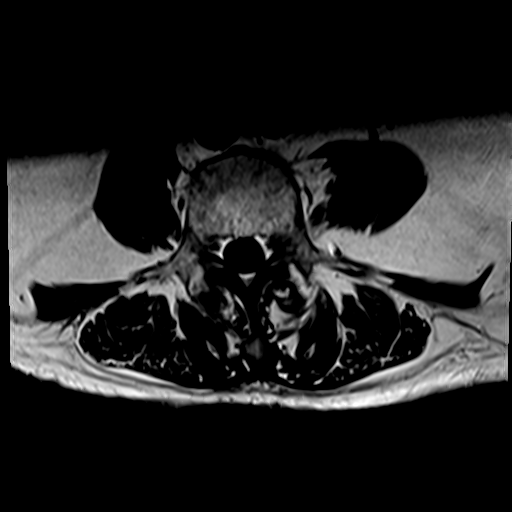
[im 17/36]
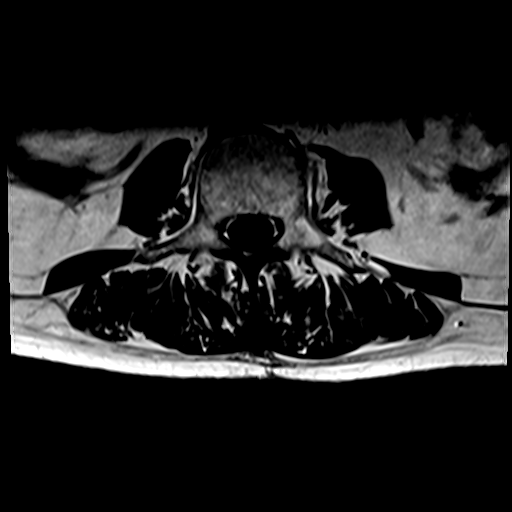
[im 19/36]
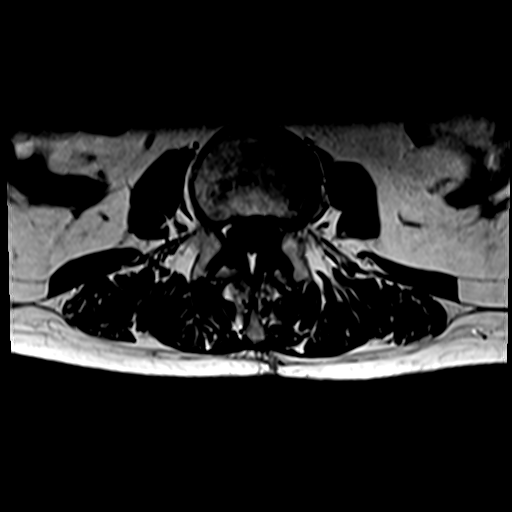
[im 25/36]
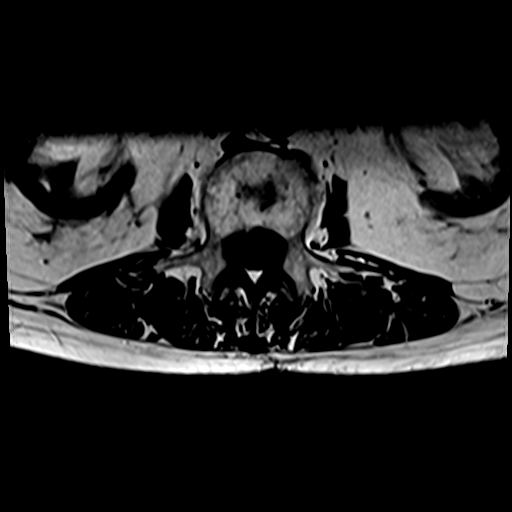
[im 30/36]
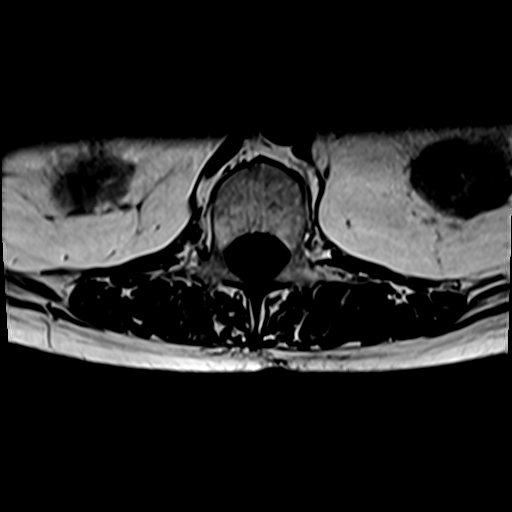
[im 36/36]
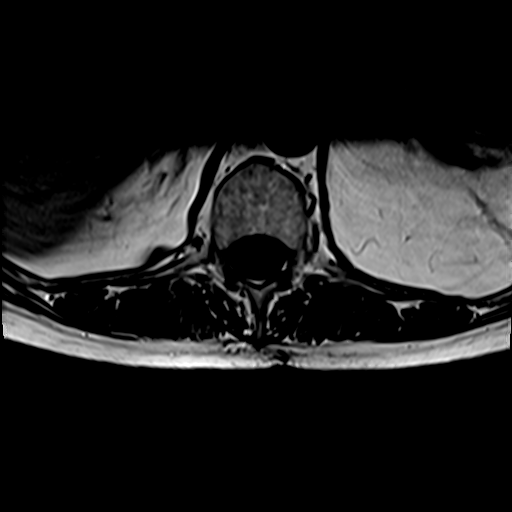

[31 of 48 positions shown; findings below may reference images not displayed]

FINDINGS: Segmentation:  5 lumbar type vertebrae

Alignment:  Mild dextrocurvature.

Vertebrae: Remote L2 superior endplate fracture. No discitis or
aggressive bone lesion

Conus medullaris and cauda equina: Conus extends to the L1 level.
Conus and cauda equina appear normal.

Paraspinal and other soft tissues: Bilateral renal cortical
thinning.

Disc levels:

T11-12: Small downward pointing disc extrusion without neural
contact.

T12- L1: Unremarkable.

L1-L2: Disc narrowing and bulging with broad protrusion that is
noncompressive.

L2-L3: Disc narrowing and bulging. Negative facets. No neural
compression

L3-L4: Facet osteoarthritis with spurring on both sides. Mild disc
bulging.

L4-L5: Asymmetric left-sided disc collapse with broad protrusion
that extends left paracentral to foraminal. Degenerative facet
spurring on both sides with 2 mm left facet cyst projecting
anteriorly. Left L5 and L4 nerve root compression at the
subarticular recess and foramen.

L5-S1:Disc narrowing with right foraminal herniation that severely
compresses the L5 nerve root against the hypertrophic facet. Mild
right subarticular recess narrowing.
IMPRESSION: 1. L5-S1 severe right foraminal impingement primarily due to large
disc herniation.
2. L4-5 compressive, degenerative left subarticular recess and
foraminal stenosis.

## 2020-01-19 ENCOUNTER — Inpatient Hospital Stay: Payer: Medicare Other | Attending: Hematology and Oncology

## 2020-01-19 ENCOUNTER — Other Ambulatory Visit: Payer: Self-pay

## 2020-01-19 ENCOUNTER — Inpatient Hospital Stay: Payer: Medicare Other

## 2020-01-19 VITALS — BP 157/71 | HR 67 | Temp 97.1°F | Resp 18

## 2020-01-19 DIAGNOSIS — N184 Chronic kidney disease, stage 4 (severe): Secondary | ICD-10-CM | POA: Diagnosis present

## 2020-01-19 DIAGNOSIS — M48061 Spinal stenosis, lumbar region without neurogenic claudication: Secondary | ICD-10-CM | POA: Insufficient documentation

## 2020-01-19 DIAGNOSIS — R197 Diarrhea, unspecified: Secondary | ICD-10-CM | POA: Insufficient documentation

## 2020-01-19 DIAGNOSIS — D472 Monoclonal gammopathy: Secondary | ICD-10-CM | POA: Diagnosis not present

## 2020-01-19 DIAGNOSIS — D631 Anemia in chronic kidney disease: Secondary | ICD-10-CM | POA: Diagnosis present

## 2020-01-19 DIAGNOSIS — M2550 Pain in unspecified joint: Secondary | ICD-10-CM | POA: Diagnosis not present

## 2020-01-19 DIAGNOSIS — Z9221 Personal history of antineoplastic chemotherapy: Secondary | ICD-10-CM | POA: Insufficient documentation

## 2020-01-19 DIAGNOSIS — Z923 Personal history of irradiation: Secondary | ICD-10-CM | POA: Insufficient documentation

## 2020-01-19 DIAGNOSIS — Z79899 Other long term (current) drug therapy: Secondary | ICD-10-CM | POA: Insufficient documentation

## 2020-01-19 DIAGNOSIS — Z85040 Personal history of malignant carcinoid tumor of rectum: Secondary | ICD-10-CM | POA: Diagnosis not present

## 2020-01-19 LAB — HEMOGLOBIN AND HEMATOCRIT, BLOOD
HCT: 32.6 % — ABNORMAL LOW (ref 36.0–46.0)
Hemoglobin: 10.3 g/dL — ABNORMAL LOW (ref 12.0–15.0)

## 2020-01-19 MED ORDER — DARBEPOETIN ALFA 150 MCG/0.3ML IJ SOSY
PREFILLED_SYRINGE | INTRAMUSCULAR | Status: AC
  Filled 2020-01-19: qty 0.3

## 2020-01-19 MED ORDER — DARBEPOETIN ALFA 150 MCG/0.3ML IJ SOSY
150.0000 ug | PREFILLED_SYRINGE | Freq: Once | INTRAMUSCULAR | Status: AC
  Administered 2020-01-19: 150 ug via SUBCUTANEOUS

## 2020-02-16 ENCOUNTER — Inpatient Hospital Stay: Payer: Medicare Other

## 2020-02-16 ENCOUNTER — Other Ambulatory Visit: Payer: Self-pay

## 2020-02-16 ENCOUNTER — Inpatient Hospital Stay: Payer: Medicare Other | Attending: Hematology and Oncology

## 2020-02-16 VITALS — BP 142/63 | HR 63 | Resp 18

## 2020-02-16 DIAGNOSIS — M2550 Pain in unspecified joint: Secondary | ICD-10-CM | POA: Insufficient documentation

## 2020-02-16 DIAGNOSIS — N184 Chronic kidney disease, stage 4 (severe): Secondary | ICD-10-CM | POA: Insufficient documentation

## 2020-02-16 DIAGNOSIS — Z79899 Other long term (current) drug therapy: Secondary | ICD-10-CM | POA: Insufficient documentation

## 2020-02-16 DIAGNOSIS — D631 Anemia in chronic kidney disease: Secondary | ICD-10-CM

## 2020-02-16 DIAGNOSIS — Z923 Personal history of irradiation: Secondary | ICD-10-CM | POA: Diagnosis not present

## 2020-02-16 DIAGNOSIS — D472 Monoclonal gammopathy: Secondary | ICD-10-CM | POA: Insufficient documentation

## 2020-02-16 DIAGNOSIS — Z9221 Personal history of antineoplastic chemotherapy: Secondary | ICD-10-CM | POA: Diagnosis not present

## 2020-02-16 DIAGNOSIS — R197 Diarrhea, unspecified: Secondary | ICD-10-CM | POA: Insufficient documentation

## 2020-02-16 DIAGNOSIS — Z85040 Personal history of malignant carcinoid tumor of rectum: Secondary | ICD-10-CM | POA: Diagnosis not present

## 2020-02-16 LAB — HEMOGLOBIN AND HEMATOCRIT, BLOOD
HCT: 31 % — ABNORMAL LOW (ref 36.0–46.0)
Hemoglobin: 9.8 g/dL — ABNORMAL LOW (ref 12.0–15.0)

## 2020-02-16 MED ORDER — DARBEPOETIN ALFA 150 MCG/0.3ML IJ SOSY
150.0000 ug | PREFILLED_SYRINGE | Freq: Once | INTRAMUSCULAR | Status: AC
  Administered 2020-02-16: 150 ug via SUBCUTANEOUS

## 2020-02-17 DIAGNOSIS — N3946 Mixed incontinence: Secondary | ICD-10-CM | POA: Insufficient documentation

## 2020-03-20 ENCOUNTER — Inpatient Hospital Stay: Payer: Medicare Other

## 2020-03-20 ENCOUNTER — Other Ambulatory Visit: Payer: Self-pay

## 2020-03-20 ENCOUNTER — Inpatient Hospital Stay: Payer: Medicare Other | Attending: Hematology and Oncology

## 2020-03-20 VITALS — BP 144/55 | HR 75 | Temp 96.4°F | Resp 16

## 2020-03-20 DIAGNOSIS — D631 Anemia in chronic kidney disease: Secondary | ICD-10-CM

## 2020-03-20 DIAGNOSIS — D472 Monoclonal gammopathy: Secondary | ICD-10-CM

## 2020-03-20 DIAGNOSIS — N184 Chronic kidney disease, stage 4 (severe): Secondary | ICD-10-CM

## 2020-03-20 DIAGNOSIS — Z85048 Personal history of other malignant neoplasm of rectum, rectosigmoid junction, and anus: Secondary | ICD-10-CM | POA: Insufficient documentation

## 2020-03-20 DIAGNOSIS — R197 Diarrhea, unspecified: Secondary | ICD-10-CM | POA: Insufficient documentation

## 2020-03-20 DIAGNOSIS — M2550 Pain in unspecified joint: Secondary | ICD-10-CM | POA: Insufficient documentation

## 2020-03-20 DIAGNOSIS — Z79899 Other long term (current) drug therapy: Secondary | ICD-10-CM | POA: Diagnosis not present

## 2020-03-20 LAB — HEMOGLOBIN: Hemoglobin: 9.5 g/dL — ABNORMAL LOW (ref 12.0–15.0)

## 2020-03-20 LAB — HEMATOCRIT: HCT: 29.9 % — ABNORMAL LOW (ref 36.0–46.0)

## 2020-03-20 MED ORDER — DARBEPOETIN ALFA 150 MCG/0.3ML IJ SOSY
150.0000 ug | PREFILLED_SYRINGE | Freq: Once | INTRAMUSCULAR | Status: AC
  Administered 2020-03-20: 150 ug via SUBCUTANEOUS

## 2020-04-12 DIAGNOSIS — I350 Nonrheumatic aortic (valve) stenosis: Secondary | ICD-10-CM | POA: Insufficient documentation

## 2020-04-12 DIAGNOSIS — I77810 Thoracic aortic ectasia: Secondary | ICD-10-CM | POA: Insufficient documentation

## 2020-04-12 DIAGNOSIS — G459 Transient cerebral ischemic attack, unspecified: Secondary | ICD-10-CM | POA: Insufficient documentation

## 2020-04-12 DIAGNOSIS — Z880 Allergy status to penicillin: Secondary | ICD-10-CM | POA: Insufficient documentation

## 2020-04-12 NOTE — Progress Notes (Signed)
[facility]  [address] [place], [place] [address] Phone: [phone]  Fax: [phone]   Clinic Day:  04/16/2020  Referring physician: Gayland Curry, MD  Chief Complaint: Misty [name] is a 80 y.o. female with anemia of chronic kidney disease who is seen for 4 month assessment.  HPI: The patient was last seen in the medical oncology clinic on 12/22/2019. At that time, she started to feel less tired about a week after her Aranesp.  Exam was stable. Hematocrit was 30.6, hemoglobin 10.0, MCV 89.0, platelets 254,000, WBC 7,200. Creatinine was 3.99 (CrCl 10 ml/min). Ferritin was 169 with an iron saturation of 26% and a TIBC of 290. M spike was not observed. Kappa free light chains were 124.6, lambda free light chains 50.6, ratio 2.46. She received Aranesp 150 mcg.  The patient saw Dr. Adonis Housekeeper (nephrology) on 02/09/2020.  Symptomatically, she felt fine.  She was s/p bladder Botox on 01/22/2021 which helped with urinary incontinence.  Appetite was fair.  Energy had improved since increasing Aranesp to 150 mcg monthly.  Creatinine was 4.5.  She was noted to have severe proteinuria.  She was interested in peritoneal dialysis  Labs followed: 01/19/2020: Hematocrit 32.6. Hemoglobin 10.3. 02/16/2020: Hematocrit 31.0. Hemoglobin   9.8. 03/20/2020: Hematocrit 29.9. Hemoglobin   9.5.  The patient received Aranesp 150 mcg monthly x 3 (01/19/2020 - 03/20/2020).  During the interim, she saw Veneda Melter, NP on 02/09/2020 in the [facility] nephrology clinic.  Botox did not help her urinary incontinence.  She is planning another procedure (urethral bulking with Coaptite) with Doylene Bode on 04/26/2020.  She has seen Dr. Candiss Norse and is considering hemodialysis.   Past Medical History:  Diagnosis Date  . Anemia   . Cancer [facility]) 2011   rectal ca/chemo /rad Lenore Manner  . Chronic kidney disease   . Diabetes mellitus without complication ([place])    type 2  .  Hypertension   . Personal history of chemotherapy   . Personal history of radiation therapy   . TIA (transient ischemic attack)     Past Surgical History:  Procedure Laterality Date  . BREAST BIOPSY Left    bx/ clip-neg  . COLONOSCOPY N/A 05/03/2020   Procedure: COLONOSCOPY;  Surgeon: Lesly Rubenstein, MD;  Location: [facility] ENDOSCOPY;  Service: Endoscopy;  Laterality: N/A;  . coloretcal cancer    . thyroid gland     surgery / unknown date    Family History  Problem Relation Age of Onset  . Lung disease Sister   . Diabetes Mellitus II Paternal Grandmother   . Breast cancer Neg Hx     Social History:  reports that she has never smoked. She has never used smokeless tobacco. She reports that she does not drink alcohol and does not use drugs. She is originally from [place] but moved to [place] in 1972.[place].She denies any alcohol or tobacco use. She denies any exposure to radiation or toxins. 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 The patient is alone today.  Allergies:  Allergies  Allergen Reactions  . Succinylcholine Other (See Comments)    Severe myalgia's  . Penicillins Rash    Current Medications: Current Outpatient Medications  Medication Sig Dispense Refill  . acetaminophen (TYLENOL) 325 MG tablet Take 325 mg by mouth every 4 (four) hours as needed.     Marland Kitchen atorvastatin (LIPITOR) 40 MG tablet Take 40  mg by mouth daily at 6 PM.     . B Complex-C-Folic Acid (RENAL) 1 MG CAPS TK 1 C PO D    . carvedilol (COREG) 3.125 MG tablet Take 3.125 mg by mouth 2 (two) times daily with a meal.     . Cetirizine HCl 10 MG CAPS Take 1 capsule by mouth daily.     . clopidogrel (PLAVIX) 75 MG tablet Take 75 mg by mouth daily.     . ferrous sulfate 325 (65 FE) MG tablet Take 325 mg by mouth 3 (three) times a week.     . Fiber Diet TABS Take 2 tablets by mouth 2 (two) times daily.      Marland Kitchen gabapentin (NEURONTIN) 300 MG capsule Take 300 mg by mouth 2 (two) times daily.     Marland Kitchen glucose blood test strip 1 each by Finger Stick route 4 times daily (after meals and nightly). Use as directed    . HUMALOG 100 UNIT/ML injection Inject into the skin as directed. Sliding Scale    . INSULIN SYRINGE 1CC/29G 29G X 1/2" 1 ML MISC     . lidocaine (LIDODERM) 5 % Place onto the skin.    Marland Kitchen NIFEdipine (ADALAT CC) 60 MG 24 hr tablet Take 60 mg by mouth daily.     . SURE COMFORT PEN NEEDLES 31G X 8 MM MISC     . colestipol (COLESTID) 1 g tablet Take 1 g by mouth 2 (two) times daily.     . hydrALAZINE (APRESOLINE) 25 MG tablet Take 1 tablet (25 mg total) by mouth 2 (two) times daily. 60 tablet 0  . insulin glargine (LANTUS) 100 UNIT/ML injection Inject 0.1 mLs (10 Units total) into the skin 2 (two) times daily. 10 mL 11  . sodium bicarbonate 650 MG tablet Take 1 tablet by mouth in the morning and at bedtime.     No current facility-administered medications for this visit.    Review of Systems  Constitutional: Negative for chills, diaphoresis, fever, malaise/fatigue and weight loss (up 1 lb).  HENT: Negative.  Negative for congestion, ear discharge, ear pain, hearing loss, nosebleeds, sinus pain, sore throat and tinnitus.   Eyes: Negative.  Negative for blurred vision and double vision.  Respiratory: Negative.  Negative for cough, hemoptysis, sputum production and shortness of breath.   Cardiovascular: Negative.  Negative for chest pain, palpitations and leg swelling.  Gastrointestinal: Positive for diarrhea (comes and goes). Negative for abdominal pain, blood in stool, constipation, heartburn, melena, nausea and vomiting.  Genitourinary: Negative for dysuria, frequency, hematuria and urgency.       Urinary incontinence.  Musculoskeletal: Positive for joint pain (hips; can not walk long distances). Negative for back pain, myalgias and neck pain.  Skin: Negative.  Negative for itching and rash.   Neurological: Negative.  Negative for dizziness, tingling, sensory change, focal weakness, weakness and headaches.  Endo/Heme/Allergies: Negative.  Does not bruise/bleed easily.  Psychiatric/Behavioral: Negative for depression and memory loss. The patient is not nervous/anxious and does not have insomnia.   All other systems reviewed and are negative.  Performance status (ECOG):  1  Vitals Blood pressure (!) 155/47, pulse 71, temperature (!) 97.3 F (36.3 C), temperature source Tympanic, resp. rate 16, weight 138 lb 0.1 oz (62.6 kg), SpO2 99 %.   Physical Exam Vitals and nursing note reviewed.  Constitutional:      General: She is not in acute distress.    Appearance: She is well-developed. She is not diaphoretic.  HENT:     Head: Normocephalic and atraumatic.     Comments: Short hair.    Mouth/Throat:     Mouth: Mucous membranes are moist.     Pharynx: Oropharynx is clear.  Eyes:     General: No scleral icterus.    Pupils: Pupils are equal, round, and reactive to light.     Comments: Blue eyes.  Neck:     Vascular: No JVD.  Cardiovascular:     Rate and Rhythm: Normal rate and regular rhythm.     Heart sounds: Murmur heard.    Pulmonary:     Effort: Pulmonary effort is normal. No respiratory distress.     Breath sounds: Normal breath sounds. No wheezing or rales.  Chest:     Chest wall: No tenderness.  Breasts:     Right: No supraclavicular adenopathy.     Left: No supraclavicular adenopathy.    Abdominal:     General: Bowel sounds are normal. There is no distension.     Palpations: Abdomen is soft. There is no mass.     Tenderness: There is no abdominal tenderness. There is no guarding or rebound.  Musculoskeletal:        General: No tenderness.     Cervical back: Normal range of motion and neck supple.  Lymphadenopathy:     Head:     Right side of head: No preauricular, posterior auricular or occipital adenopathy.     Left side of head: No preauricular,  posterior auricular or occipital adenopathy.     Cervical: No cervical adenopathy.     Upper Body:     Right upper body: No supraclavicular adenopathy.     Left upper body: No supraclavicular adenopathy.  Skin:    General: Skin is warm and dry.     Coloration: Skin is not pale.     Findings: No erythema or rash.  Neurological:     Mental Status: She is alert and oriented to person, place, and time.  Psychiatric:        Behavior: Behavior normal.        Thought Content: Thought content normal.        Judgment: Judgment normal.    Appointment on 04/16/2020  Component Date Value Ref Range Status  . Kappa free light chain 04/16/2020 120.6* 3.3 - 19.4 mg/L Final  . Lamda free light chains 04/16/2020 49.9* 5.7 - 26.3 mg/L Final  . Kappa, lamda light chain ratio 04/16/2020 2.42* 0.26 - 1.65 Final   Comment: (NOTE) Performed At: [facility] [address] [place], [place] [id number] Jolene Schimke MD CQ:[id number]   . Total Protein ELP 04/16/2020 6.3  6.0 - 8.5 g/dL Final  . Albumin ELP 32/25/6720 3.4  2.9 - 4.4 g/dL Final  . PZZCK-2-CHTVGVSY 04/16/2020 0.2  0.0 - 0.4 g/dL Final  . VGCYO-8-OOJZBFMZ 04/16/2020 1.0  0.4 - 1.0 g/dL Final  . Beta Globulin 04/16/2020 0.9  0.7 - 1.3 g/dL Final  . Gamma Globulin 04/16/2020 0.8  0.4 - 1.8 g/dL Final  . M-Spike, % 06/12/5911 Not Observed  Not Observed g/dL Final  . SPE Interp. 04/16/2020 Comment   Final   Comment: (NOTE) The SPE pattern appears unremarkable. Evidence of monoclonal protein is not apparent. Performed At: [facility] [address] [place], [place] [id number] Jolene Schimke MD GQ:[id number]   . Comment 04/16/2020 Comment   Final   Comment: (NOTE) Protein electrophoresis scan will follow via computer, mail, or courier delivery.   . Globulin,  Total 04/16/2020 2.9  2.2 - 3.9 g/dL Corrected  . A/G Ratio 04/16/2020 1.2  0.7 - 1.7 Corrected  . Iron 04/16/2020 73  28 - 170 ug/dL Final  . TIBC 04/16/2020 253   250 - 450 ug/dL Final  . Saturation Ratios 04/16/2020 29  10.4 - 31.8 % Final  . UIBC 04/16/2020 180  ug/dL Final   Performed at [facility], [address], [place], [place] [address]  . Ferritin 04/16/2020 146  11 - 307 ng/mL Final   Performed at [facility], [address] [place], [place] [address]  . WBC 04/16/2020 8.5  4.0 - 10.5 K/uL Final  . RBC 04/16/2020 3.71* 3.87 - 5.11 MIL/uL Final  . Hemoglobin 04/16/2020 10.5* 12.0 - 15.0 g/dL Final  . HCT 04/16/2020 33.8* 36.0 - 46.0 % Final  . MCV 04/16/2020 91.1  80.0 - 100.0 fL Final  . MCH 04/16/2020 28.3  26.0 - 34.0 pg Final  . MCHC 04/16/2020 31.1  30.0 - 36.0 g/dL Final  . RDW 04/16/2020 15.5  11.5 - 15.5 % Final  . Platelets 04/16/2020 334  150 - 400 K/uL Final  . nRBC 04/16/2020 0.0  0.0 - 0.2 % Final  . Neutrophils Relative % 04/16/2020 69  % Final  . Neutro Abs 04/16/2020 5.9  1.7 - 7.7 K/uL Final  . Lymphocytes Relative 04/16/2020 19  % Final  . Lymphs Abs 04/16/2020 1.6  0.7 - 4.0 K/uL Final  . Monocytes Relative 04/16/2020 6  % Final  . Monocytes Absolute 04/16/2020 0.5  0.1 - 1.0 K/uL Final  . Eosinophils Relative 04/16/2020 4  % Final  . Eosinophils Absolute 04/16/2020 0.4  0.0 - 0.5 K/uL Final  . Basophils Relative 04/16/2020 1  % Final  . Basophils Absolute 04/16/2020 0.1  0.0 - 0.1 K/uL Final  . Immature Granulocytes 04/16/2020 1  % Final  . Abs Immature Granulocytes 04/16/2020 0.05  0.00 - 0.07 K/uL Final   Performed at [facility], [address], [place], [place] [address]    Assessment:  Siddhi Dornbush is a 81 y.o. female with anemia of chronic renal disease.  She has chronic renal insufficiency.Creatinine was 2.7 (creatinine clearance 17 ml/min) on 09/07/2018.  Work-up on 08/07/2020revealed ahematocrit 33.3, hemoglobin 11.0, MCV 88.6, platelets 304,000,andWBC 7,400.Differential was normal.Ferritinwas135with an iron saturation of26% and aTIBC 315.  Vitamin B12 was511. Folate >100.0. SPEPrevealed no M-spike. Kappa free light chains110.2, lambda free light chains 38.5.andratio 2.86(0.26-1.65).24 hour UPEP revealed 1384 mg/24 hoursof proteinwithout a monoclonal spike.  She began Aranesp 100 mcg on 12/16/2018.  Aranesp was increased to 150 mcg on 12/22/2019 (last 03/20/2020).  Ferritin was 169 on 12/22/2019.  She has amonoclonal gammopathy or unknown significance(MGUS).Bone marrow biopsyon 02/04/2017 was sub-optimal.Congo red stains and abdominal wall fat pad aspirationwere negative.She was briefly treated with Velcade and Decadron beginning in 08/12/2017. Treatment was complicated by hyperglycemia and diarrhea. 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 There was very variably cellular marrow, overall 50% with trilineage hematopoiesis. There were abnormal plasma cells present at normal plasma cell background detected by flow cytometry. Findings wereconsistent with a low level of involvement of bone marrow with the patient's previously diagnosed plasma cell neoplasm.  24-hour UPEPon 12/28/2017 revealed 1911mg /24hrsprotein with no M-spike and normal immunofixation. Kappa free light chains were 101, lambda free light chain 6.39 and a ratio of 15.81 (2.04 - 10.37).Whole body MRIon 02/04/2018 revealed no evidence of discrete focal  dominant marrow lesion.   M-spike has been followed (gm/dL): 0 on 10/15/2018 and 0 on 04/21/2019. Kappa FLCA has been followed: 110.2 (ratio 2.86) on 10/15/2018 and 98.2 (ratio 2.21) on 04/21/2019.  She has a history of rectal cancers/p neoadjuvant chemotherapy followed by resection on 08/08/2009. Transrectalultrasound in 01/2011confirmed a 4 cm mass from the anal verge, encompassing 1/3 of the circumference of the rectum.Clinical stagewas T3N0. She received3 cycles of CI 5FU.There was noresidual  carcinomaon resection. She underwent a restorative proctectomy with colonic J-pouch and coloanal anastomosis. Two lymph nodes werenegative. She declined adjuvant chemotherapy.Colonoscopyin 03/2017 showed no evidence of recurrence.CEAwas1.5on 10/15/2018.  Symptomatically, she has ongoing issues with urinary incontinence.  She is considering hemodialysis.  Exam is stable.  Plan: 1.   Labs today: CBC with diff, ferritin, iron studies, SPEP, FLCA. 2.   Anemia of chronic kidney disease Clinically, she is doing well. Hematocrit  33.8. Hemoglobin 10.5 today.              She receive receives Aranesp monthly (last 03/20/2020).  Continue Aranesp 150 mcg a month. 3.Monoclonal gammopathy of unknown significance (MGUS) M-spike is 0 today. Free light chains showed a slight excess of kappa free light chains. 4.Rectal cancer(2011) She denies any concerns. 5.   Aranesp today. 6.   RTC every 4 weeks x 3 for labs (hematocrit/hemoglobin) and +/- Aranesp. 7.   RTC in 16 weeks for MD assessment, labs (CBC with diff, creatinine, ferritin, iron studies) and +/- Aranesp.  I discussed the assessment and treatment plan with the patient.  The patient was provided an opportunity to ask questions and all were answered.  The patient agreed with the plan and demonstrated an understanding of the instructions.  The patient was advised to call back if the symptoms worsen or if the condition fails to improve as anticipated.   Lequita Asal, MD, PhD    04/16/2020, 11:40 AM

## 2020-04-16 ENCOUNTER — Other Ambulatory Visit: Payer: Self-pay

## 2020-04-16 ENCOUNTER — Other Ambulatory Visit: Payer: Self-pay | Admitting: Hematology and Oncology

## 2020-04-16 ENCOUNTER — Encounter: Payer: Self-pay | Admitting: Hematology and Oncology

## 2020-04-16 ENCOUNTER — Inpatient Hospital Stay: Payer: Medicare Other

## 2020-04-16 ENCOUNTER — Inpatient Hospital Stay (HOSPITAL_BASED_OUTPATIENT_CLINIC_OR_DEPARTMENT_OTHER): Payer: Medicare Other | Admitting: Hematology and Oncology

## 2020-04-16 ENCOUNTER — Inpatient Hospital Stay: Payer: Medicare Other | Attending: Hematology and Oncology

## 2020-04-16 VITALS — BP 155/47 | HR 71 | Temp 97.3°F | Resp 16 | Wt 138.0 lb

## 2020-04-16 DIAGNOSIS — D631 Anemia in chronic kidney disease: Secondary | ICD-10-CM

## 2020-04-16 DIAGNOSIS — D472 Monoclonal gammopathy: Secondary | ICD-10-CM

## 2020-04-16 DIAGNOSIS — Z85048 Personal history of other malignant neoplasm of rectum, rectosigmoid junction, and anus: Secondary | ICD-10-CM | POA: Diagnosis not present

## 2020-04-16 DIAGNOSIS — N184 Chronic kidney disease, stage 4 (severe): Secondary | ICD-10-CM

## 2020-04-16 DIAGNOSIS — Z79899 Other long term (current) drug therapy: Secondary | ICD-10-CM | POA: Insufficient documentation

## 2020-04-16 LAB — CBC WITH DIFFERENTIAL/PLATELET
Abs Immature Granulocytes: 0.05 10*3/uL (ref 0.00–0.07)
Basophils Absolute: 0.1 10*3/uL (ref 0.0–0.1)
Basophils Relative: 1 %
Eosinophils Absolute: 0.4 10*3/uL (ref 0.0–0.5)
Eosinophils Relative: 4 %
HCT: 33.8 % — ABNORMAL LOW (ref 36.0–46.0)
Hemoglobin: 10.5 g/dL — ABNORMAL LOW (ref 12.0–15.0)
Immature Granulocytes: 1 %
Lymphocytes Relative: 19 %
Lymphs Abs: 1.6 10*3/uL (ref 0.7–4.0)
MCH: 28.3 pg (ref 26.0–34.0)
MCHC: 31.1 g/dL (ref 30.0–36.0)
MCV: 91.1 fL (ref 80.0–100.0)
Monocytes Absolute: 0.5 10*3/uL (ref 0.1–1.0)
Monocytes Relative: 6 %
Neutro Abs: 5.9 10*3/uL (ref 1.7–7.7)
Neutrophils Relative %: 69 %
Platelets: 334 10*3/uL (ref 150–400)
RBC: 3.71 MIL/uL — ABNORMAL LOW (ref 3.87–5.11)
RDW: 15.5 % (ref 11.5–15.5)
WBC: 8.5 10*3/uL (ref 4.0–10.5)
nRBC: 0 % (ref 0.0–0.2)

## 2020-04-16 LAB — FERRITIN: Ferritin: 146 ng/mL (ref 11–307)

## 2020-04-16 LAB — IRON AND TIBC
Iron: 73 ug/dL (ref 28–170)
Saturation Ratios: 29 % (ref 10.4–31.8)
TIBC: 253 ug/dL (ref 250–450)
UIBC: 180 ug/dL

## 2020-04-16 MED ORDER — DARBEPOETIN ALFA 150 MCG/0.3ML IJ SOSY
150.0000 ug | PREFILLED_SYRINGE | Freq: Once | INTRAMUSCULAR | Status: AC
  Administered 2020-04-16: 150 ug via SUBCUTANEOUS
  Filled 2020-04-16: qty 0.3

## 2020-04-16 NOTE — Progress Notes (Signed)
Patient states no new concerns. 

## 2020-04-17 LAB — PROTEIN ELECTROPHORESIS, SERUM
A/G Ratio: 1.2 (ref 0.7–1.7)
Albumin ELP: 3.4 g/dL (ref 2.9–4.4)
Alpha-1-Globulin: 0.2 g/dL (ref 0.0–0.4)
Alpha-2-Globulin: 1 g/dL (ref 0.4–1.0)
Beta Globulin: 0.9 g/dL (ref 0.7–1.3)
Gamma Globulin: 0.8 g/dL (ref 0.4–1.8)
Globulin, Total: 2.9 g/dL (ref 2.2–3.9)
Total Protein ELP: 6.3 g/dL (ref 6.0–8.5)

## 2020-04-17 LAB — KAPPA/LAMBDA LIGHT CHAINS
Kappa free light chain: 120.6 mg/L — ABNORMAL HIGH (ref 3.3–19.4)
Kappa, lambda light chain ratio: 2.42 — ABNORMAL HIGH (ref 0.26–1.65)
Lambda free light chains: 49.9 mg/L — ABNORMAL HIGH (ref 5.7–26.3)

## 2020-05-02 ENCOUNTER — Inpatient Hospital Stay
Admission: EM | Admit: 2020-05-02 | Discharge: 2020-05-04 | DRG: 378 | Disposition: A | Discharge status: Home / Self Care | Payer: Medicare Other | Attending: Internal Medicine | Admitting: Internal Medicine

## 2020-05-02 ENCOUNTER — Other Ambulatory Visit: Payer: Self-pay

## 2020-05-02 DIAGNOSIS — Z794 Long term (current) use of insulin: Secondary | ICD-10-CM

## 2020-05-02 DIAGNOSIS — Z20822 Contact with and (suspected) exposure to covid-19: Secondary | ICD-10-CM | POA: Diagnosis present

## 2020-05-02 DIAGNOSIS — I10 Essential (primary) hypertension: Secondary | ICD-10-CM | POA: Diagnosis present

## 2020-05-02 DIAGNOSIS — Z888 Allergy status to other drugs, medicaments and biological substances status: Secondary | ICD-10-CM

## 2020-05-02 DIAGNOSIS — Z85048 Personal history of other malignant neoplasm of rectum, rectosigmoid junction, and anus: Secondary | ICD-10-CM

## 2020-05-02 DIAGNOSIS — E785 Hyperlipidemia, unspecified: Secondary | ICD-10-CM | POA: Diagnosis present

## 2020-05-02 DIAGNOSIS — E875 Hyperkalemia: Secondary | ICD-10-CM | POA: Diagnosis not present

## 2020-05-02 DIAGNOSIS — Z880 Allergy status to penicillin: Secondary | ICD-10-CM

## 2020-05-02 DIAGNOSIS — N184 Chronic kidney disease, stage 4 (severe): Secondary | ICD-10-CM | POA: Diagnosis not present

## 2020-05-02 DIAGNOSIS — E11649 Type 2 diabetes mellitus with hypoglycemia without coma: Secondary | ICD-10-CM | POA: Diagnosis present

## 2020-05-02 DIAGNOSIS — E7800 Pure hypercholesterolemia, unspecified: Secondary | ICD-10-CM | POA: Diagnosis present

## 2020-05-02 DIAGNOSIS — K921 Melena: Secondary | ICD-10-CM | POA: Diagnosis not present

## 2020-05-02 DIAGNOSIS — Z8579 Personal history of other malignant neoplasms of lymphoid, hematopoietic and related tissues: Secondary | ICD-10-CM

## 2020-05-02 DIAGNOSIS — Z9221 Personal history of antineoplastic chemotherapy: Secondary | ICD-10-CM

## 2020-05-02 DIAGNOSIS — Z23 Encounter for immunization: Secondary | ICD-10-CM

## 2020-05-02 DIAGNOSIS — K625 Hemorrhage of anus and rectum: Secondary | ICD-10-CM | POA: Diagnosis present

## 2020-05-02 DIAGNOSIS — E1122 Type 2 diabetes mellitus with diabetic chronic kidney disease: Secondary | ICD-10-CM | POA: Diagnosis present

## 2020-05-02 DIAGNOSIS — E1129 Type 2 diabetes mellitus with other diabetic kidney complication: Secondary | ICD-10-CM | POA: Diagnosis present

## 2020-05-02 DIAGNOSIS — C20 Malignant neoplasm of rectum: Secondary | ICD-10-CM | POA: Diagnosis present

## 2020-05-02 DIAGNOSIS — C9000 Multiple myeloma not having achieved remission: Secondary | ICD-10-CM | POA: Diagnosis present

## 2020-05-02 DIAGNOSIS — D472 Monoclonal gammopathy: Secondary | ICD-10-CM | POA: Diagnosis present

## 2020-05-02 DIAGNOSIS — I129 Hypertensive chronic kidney disease with stage 1 through stage 4 chronic kidney disease, or unspecified chronic kidney disease: Secondary | ICD-10-CM | POA: Diagnosis present

## 2020-05-02 DIAGNOSIS — Z7902 Long term (current) use of antithrombotics/antiplatelets: Secondary | ICD-10-CM

## 2020-05-02 DIAGNOSIS — Z8673 Personal history of transient ischemic attack (TIA), and cerebral infarction without residual deficits: Secondary | ICD-10-CM

## 2020-05-02 DIAGNOSIS — Z833 Family history of diabetes mellitus: Secondary | ICD-10-CM

## 2020-05-02 DIAGNOSIS — E1151 Type 2 diabetes mellitus with diabetic peripheral angiopathy without gangrene: Secondary | ICD-10-CM | POA: Diagnosis present

## 2020-05-02 DIAGNOSIS — K922 Gastrointestinal hemorrhage, unspecified: Secondary | ICD-10-CM

## 2020-05-02 DIAGNOSIS — D631 Anemia in chronic kidney disease: Secondary | ICD-10-CM | POA: Diagnosis present

## 2020-05-02 DIAGNOSIS — Z79899 Other long term (current) drug therapy: Secondary | ICD-10-CM

## 2020-05-02 DIAGNOSIS — Z923 Personal history of irradiation: Secondary | ICD-10-CM

## 2020-05-02 HISTORY — DX: Transient cerebral ischemic attack, unspecified: G45.9

## 2020-05-02 LAB — CBC
HCT: 32.8 % — ABNORMAL LOW (ref 36.0–46.0)
HCT: 28.8 % — ABNORMAL LOW (ref 36.0–46.0)
HCT: 31.5 % — ABNORMAL LOW (ref 36.0–46.0)
Hemoglobin: 10.4 g/dL — ABNORMAL LOW (ref 12.0–15.0)
Hemoglobin: 9.4 g/dL — ABNORMAL LOW (ref 12.0–15.0)
Hemoglobin: 9.7 g/dL — ABNORMAL LOW (ref 12.0–15.0)
MCH: 28.8 pg (ref 26.0–34.0)
MCH: 29.4 pg (ref 26.0–34.0)
MCH: 28.1 pg (ref 26.0–34.0)
MCHC: 31.7 g/dL (ref 30.0–36.0)
MCHC: 32.6 g/dL (ref 30.0–36.0)
MCHC: 30.8 g/dL (ref 30.0–36.0)
MCV: 90.9 fL (ref 80.0–100.0)
MCV: 90 fL (ref 80.0–100.0)
MCV: 91.3 fL (ref 80.0–100.0)
Platelets: 261 10*3/uL (ref 150–400)
Platelets: 205 10*3/uL (ref 150–400)
Platelets: 225 10*3/uL (ref 150–400)
RBC: 3.61 MIL/uL — ABNORMAL LOW (ref 3.87–5.11)
RBC: 3.2 MIL/uL — ABNORMAL LOW (ref 3.87–5.11)
RBC: 3.45 MIL/uL — ABNORMAL LOW (ref 3.87–5.11)
RDW: 16.3 % — ABNORMAL HIGH (ref 11.5–15.5)
RDW: 16.1 % — ABNORMAL HIGH (ref 11.5–15.5)
RDW: 16.4 % — ABNORMAL HIGH (ref 11.5–15.5)
WBC: 7.4 10*3/uL (ref 4.0–10.5)
WBC: 6.2 10*3/uL (ref 4.0–10.5)
WBC: 8.2 10*3/uL (ref 4.0–10.5)
nRBC: 0 % (ref 0.0–0.2)
nRBC: 0 % (ref 0.0–0.2)
nRBC: 0 % (ref 0.0–0.2)

## 2020-05-02 LAB — COMPREHENSIVE METABOLIC PANEL
ALT: 15 U/L (ref 0–44)
AST: 17 U/L (ref 15–41)
Albumin: 3.4 g/dL — ABNORMAL LOW (ref 3.5–5.0)
Alkaline Phosphatase: 30 U/L — ABNORMAL LOW (ref 38–126)
Anion gap: 10 (ref 5–15)
BUN: 42 mg/dL — ABNORMAL HIGH (ref 8–23)
CO2: 21 mmol/L — ABNORMAL LOW (ref 22–32)
Calcium: 9.1 mg/dL (ref 8.9–10.3)
Chloride: 106 mmol/L (ref 98–111)
Creatinine, Ser: 4.7 mg/dL — ABNORMAL HIGH (ref 0.44–1.00)
GFR, Estimated: 9 mL/min — ABNORMAL LOW (ref >60)
Glucose, Bld: 230 mg/dL — ABNORMAL HIGH (ref 70–99)
Potassium: 5.6 mmol/L — ABNORMAL HIGH (ref 3.5–5.1)
Sodium: 137 mmol/L (ref 135–145)
Total Bilirubin: 0.6 mg/dL (ref 0.3–1.2)
Total Protein: 7.1 g/dL (ref 6.5–8.1)

## 2020-05-02 LAB — RESP PANEL BY RT-PCR (FLU A&B, COVID) ARPGX2
Influenza A by PCR: NEGATIVE
Influenza B by PCR: NEGATIVE
SARS Coronavirus 2 by RT PCR: NEGATIVE

## 2020-05-02 LAB — BRAIN NATRIURETIC PEPTIDE: B Natriuretic Peptide: 205.2 pg/mL — ABNORMAL HIGH (ref 0.0–100.0)

## 2020-05-02 LAB — GLUCOSE, CAPILLARY
Glucose-Capillary: 105 mg/dL — ABNORMAL HIGH (ref 70–99)
Glucose-Capillary: 117 mg/dL — ABNORMAL HIGH (ref 70–99)

## 2020-05-02 LAB — TYPE AND SCREEN
ABO/RH(D): A NEG
Antibody Screen: NEGATIVE

## 2020-05-02 LAB — APTT: aPTT: 30 seconds (ref 24–36)

## 2020-05-02 LAB — PROTIME-INR
INR: 1 (ref 0.8–1.2)
Prothrombin Time: 12.4 seconds (ref 11.4–15.2)

## 2020-05-02 LAB — POC OCCULT BLOOD, ED: Occult Blood: POSITIVE

## 2020-05-02 MED ORDER — DEXTROSE 50 % IV SOLN
1.0000 | Freq: Once | INTRAVENOUS | Status: AC
  Administered 2020-05-02: 50 mL via INTRAVENOUS
  Filled 2020-05-02: qty 50

## 2020-05-02 MED ORDER — INSULIN GLARGINE 100 UNIT/ML ~~LOC~~ SOLN
28.0000 [IU] | Freq: Two times a day (BID) | SUBCUTANEOUS | Status: DC
  Administered 2020-05-02: 28 [IU] via SUBCUTANEOUS
  Filled 2020-05-02 (×3): qty 0.28

## 2020-05-02 MED ORDER — SODIUM ZIRCONIUM CYCLOSILICATE 10 G PO PACK
10.0000 g | PACK | Freq: Once | ORAL | Status: AC
  Administered 2020-05-02: 10 g via ORAL
  Filled 2020-05-02: qty 1

## 2020-05-02 MED ORDER — HYDRALAZINE HCL 25 MG PO TABS
25.0000 mg | ORAL_TABLET | Freq: Every day | ORAL | Status: DC
  Administered 2020-05-04: 25 mg via ORAL
  Filled 2020-05-02: qty 1

## 2020-05-02 MED ORDER — GABAPENTIN 300 MG PO CAPS
300.0000 mg | ORAL_CAPSULE | Freq: Two times a day (BID) | ORAL | Status: DC
  Administered 2020-05-02 – 2020-05-04 (×3): 300 mg via ORAL
  Filled 2020-05-02 (×3): qty 1

## 2020-05-02 MED ORDER — NIFEDIPINE ER 60 MG PO TB24
60.0000 mg | ORAL_TABLET | Freq: Every day | ORAL | Status: DC
  Administered 2020-05-04: 60 mg via ORAL
  Filled 2020-05-02 (×2): qty 1

## 2020-05-02 MED ORDER — LORATADINE 10 MG PO TABS
10.0000 mg | ORAL_TABLET | Freq: Every day | ORAL | Status: DC
  Administered 2020-05-04: 10 mg via ORAL
  Filled 2020-05-02: qty 1

## 2020-05-02 MED ORDER — SODIUM BICARBONATE 650 MG PO TABS
650.0000 mg | ORAL_TABLET | Freq: Two times a day (BID) | ORAL | Status: DC
  Administered 2020-05-02 – 2020-05-04 (×3): 650 mg via ORAL
  Filled 2020-05-02 (×3): qty 1

## 2020-05-02 MED ORDER — INSULIN ASPART 100 UNIT/ML IV SOLN
5.0000 [IU] | Freq: Once | INTRAVENOUS | Status: AC
  Administered 2020-05-02: 5 [IU] via INTRAVENOUS
  Filled 2020-05-02: qty 0.05

## 2020-05-02 MED ORDER — CARVEDILOL 6.25 MG PO TABS
3.1250 mg | ORAL_TABLET | Freq: Two times a day (BID) | ORAL | Status: DC
  Administered 2020-05-02 – 2020-05-03 (×2): 3.125 mg via ORAL
  Filled 2020-05-02 (×2): qty 1

## 2020-05-02 MED ORDER — INSULIN ASPART 100 UNIT/ML ~~LOC~~ SOLN
0.0000 [IU] | Freq: Three times a day (TID) | SUBCUTANEOUS | Status: DC
  Administered 2020-05-04: 2 [IU] via SUBCUTANEOUS
  Filled 2020-05-02 (×2): qty 1

## 2020-05-02 MED ORDER — ACETAMINOPHEN 325 MG PO TABS: 650.0000 mg | ORAL_TABLET | Freq: Four times a day (QID) | ORAL | Status: DC | PRN

## 2020-05-02 MED ORDER — NUTRISOURCE FIBER PO PACK
1.0000 | PACK | Freq: Two times a day (BID) | ORAL | Status: DC
  Filled 2020-05-02 (×4): qty 1

## 2020-05-02 MED ORDER — PANTOPRAZOLE SODIUM 40 MG IV SOLR
40.0000 mg | Freq: Two times a day (BID) | INTRAVENOUS | Status: DC
  Administered 2020-05-02 – 2020-05-04 (×5): 40 mg via INTRAVENOUS
  Filled 2020-05-02 (×5): qty 40

## 2020-05-02 MED ORDER — COLESTIPOL HCL 1 G PO TABS
1.0000 g | ORAL_TABLET | Freq: Two times a day (BID) | ORAL | Status: DC
  Administered 2020-05-03 – 2020-05-04 (×2): 1 g via ORAL
  Filled 2020-05-02 (×5): qty 1

## 2020-05-02 MED ORDER — SODIUM CHLORIDE 0.9 % IV SOLN: INTRAVENOUS | Status: DC

## 2020-05-02 MED ORDER — ONDANSETRON HCL 4 MG/2ML IJ SOLN
4.0000 mg | Freq: Three times a day (TID) | INTRAMUSCULAR | Status: DC | PRN
  Administered 2020-05-02: 4 mg via INTRAVENOUS
  Filled 2020-05-02: qty 2

## 2020-05-02 MED ORDER — INSULIN ASPART 100 UNIT/ML ~~LOC~~ SOLN
0.0000 [IU] | Freq: Every day | SUBCUTANEOUS | Status: DC
  Administered 2020-05-03: 2 [IU] via SUBCUTANEOUS

## 2020-05-02 MED ORDER — PNEUMOCOCCAL VAC POLYVALENT 25 MCG/0.5ML IJ INJ
0.5000 mL | INJECTION | INTRAMUSCULAR | Status: AC
  Administered 2020-05-04: 0.5 mL via INTRAMUSCULAR
  Filled 2020-05-02: qty 0.5

## 2020-05-02 MED ORDER — FERROUS SULFATE 325 (65 FE) MG PO TABS
325.0000 mg | ORAL_TABLET | ORAL | Status: DC
  Administered 2020-05-02: 325 mg via ORAL
  Filled 2020-05-02 (×2): qty 1

## 2020-05-02 MED ORDER — HYDRALAZINE HCL 20 MG/ML IJ SOLN
5.0000 mg | INTRAMUSCULAR | Status: DC | PRN
  Administered 2020-05-02 (×2): 5 mg via INTRAVENOUS
  Filled 2020-05-02 (×2): qty 1

## 2020-05-02 MED ORDER — ATORVASTATIN CALCIUM 20 MG PO TABS
40.0000 mg | ORAL_TABLET | Freq: Every day | ORAL | Status: DC
  Administered 2020-05-02 – 2020-05-03 (×2): 40 mg via ORAL
  Filled 2020-05-02 (×2): qty 2

## 2020-05-02 MED ORDER — PEG 3350-KCL-NA BICARB-NACL 420 G PO SOLR
4000.0000 mL | Freq: Once | ORAL | Status: AC
  Administered 2020-05-02: 4000 mL via ORAL
  Filled 2020-05-02: qty 4000

## 2020-05-02 MED ORDER — FIBER DIET PO TABS: 2.0000 | ORAL_TABLET | Freq: Two times a day (BID) | ORAL | Status: DC

## 2020-05-02 MED ORDER — SODIUM BICARBONATE 8.4 % IV SOLN
50.0000 meq | Freq: Once | INTRAVENOUS | Status: AC
  Administered 2020-05-02: 50 meq via INTRAVENOUS
  Filled 2020-05-02: qty 50

## 2020-05-02 NOTE — ED Notes (Signed)
GI NP at bedside. 

## 2020-05-02 NOTE — ED Provider Notes (Signed)
[facility] Emergency Department Provider Note  ____________________________________________   Event Date/Time   First MD Initiated Contact with Patient 05/02/20 1337     (approximate)  I have reviewed the triage vital signs and the nursing notes.   HISTORY  Chief Complaint Rectal bleeding  HPI Misty [name] is a 80 y.o. female with rectal cancer status post chemotherapy radiation therapy, anemia, CKD, diabetes, hypertension who comes in for rectal bleeding.  Patient stated that she has rectal bleeding that started yesterday.  Patient is on Plavix.  Patient states that the bleeding is moderate, intermittent, nothing makes better, nothing makes it worse.  States that she is passing large blood clots.  She denies any abdominal pain.  No vomiting blood.  Otherwise patient's been feeling well.        Past Medical History:  Diagnosis Date  . Anemia   . Cancer [facility]) 2011   rectal ca/chemo /rad Lenore Manner  . Chronic kidney disease   . Diabetes mellitus without complication ([place])    type 2  . Hypertension   . Personal history of chemotherapy   . Personal history of radiation therapy     Patient Active Problem List   Diagnosis Date Noted  . Anemia in stage 4 chronic kidney disease ([place]) 10/15/2018  . Acute kidney failure ([place]) 10/15/2018  . Fracture of scapular body 10/15/2018  . Chronic kidney disease, stage 4 (severe) ([place]) 08/31/2018  . Disorder of mineral metabolism, unspecified 08/31/2018  . 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 Nondisplaced fracture of acromial process, unspecified shoulder, initial encounter for closed fracture 08/19/2017  . Hyperuricemia 05/22/2017  . Multiple myeloma ([place]) 05/19/2017  . Pseudophakia of both eyes 01/11/2015  . PCO (posterior capsular opacification) 01/11/2015  . Paresthesia 01/19/2014  . Wrist pain 01/04/2014  . PVD (posterior vitreous detachment), both eyes 08/10/2012  .  Glaucoma suspect of both eyes 08/10/2012  . Diabetes mellitus ([place]) 03/26/2010  . Hypertension 04/03/2009  . Malignant neoplasm of rectum ([place]) 03/29/2009  . Vertigo 10/31/2008  . Hypercholesterolemia 10/31/2008    Past Surgical History:  Procedure Laterality Date  . BREAST BIOPSY Left    bx/ clip-neg  . coloretcal cancer    . thyroid gland     surgery / unknown date    Prior to Admission medications   Medication Sig Start Date End Date Taking? Authorizing Provider  acetaminophen (TYLENOL) 325 MG tablet Take 325 mg by mouth every 4 (four) hours as needed.     [provider]  atorvastatin (LIPITOR) 40 MG tablet Take 40 mg by mouth daily at 6 PM.  07/27/18   [provider]  B Complex-C-Folic Acid (RENAL) 1 MG CAPS TK 1 C PO D 07/26/18   [provider]  carvedilol (COREG) 3.125 MG tablet Take 3.125 mg by mouth 2 (two) times daily with a meal.  08/02/18   [provider]  Cetirizine HCl 10 MG CAPS Take 1 capsule by mouth daily.     [provider]  clopidogrel (PLAVIX) 75 MG tablet Take 75 mg by mouth daily.     [provider]  colestipol (COLESTID) 1 g tablet Take 1 g by mouth 2 (two) times daily.  10/04/18 12/22/19  [provider]  ferrous sulfate 325 (65 FE) MG tablet Take 325 mg by mouth 3 (three) times a week.     [provider]  Fiber Diet TABS Take 2 tablets by mouth 2 (two) times daily.  [provider]  gabapentin (NEURONTIN) 300 MG capsule Take 300 mg by mouth 2 (two) times daily.  08/31/18   [provider]  glimepiride (AMARYL) 2 MG tablet Take 2 mg by mouth daily with breakfast.  08/09/18   [provider]  glucose blood test strip 1 each by Finger Stick route 4 times daily (after meals and nightly). Use as directed 12/24/17   [provider]  HUMALOG 100 UNIT/ML injection Inject into the skin as directed. Sliding Scale 05/20/18   [provider]  hydrALAZINE  (APRESOLINE) 25 MG tablet Take 25 mg by mouth daily.  08/09/18   [provider]  insulin glargine (LANTUS) 100 UNIT/ML injection Inject 35 Units into the skin 2 (two) times daily.     [provider]  INSULIN SYRINGE 1CC/29G 29G X 1/2" 1 ML MISC     [provider]  latanoprost (XALATAN) 0.005 % ophthalmic solution Place 1 drop into the left eye at bedtime.  Patient not taking: No sig reported 09/09/18   [provider]  lidocaine (LIDODERM) 5 % Place onto the skin.    [provider]  lisinopril (ZESTRIL) 10 MG tablet Take 10 mg by mouth daily.  09/09/18   [provider]  NIFEdipine (ADALAT CC) 60 MG 24 hr tablet Take 60 mg by mouth daily.  10/20/17   [provider]  SURE COMFORT PEN NEEDLES 31G X 8 MM [place]  08/28/18   [provider]    Allergies Succinylcholine and Penicillins  Family History  Problem Relation Age of Onset  . Breast cancer Neg Hx     Social History Social History   Tobacco Use  . Smoking status: Never Smoker  . Smokeless tobacco: Never Used  Vaping Use  . Vaping Use: Never used  Substance Use Topics  . Alcohol use: Never      Review of Systems Constitutional: No fever/chills Eyes: No visual changes. ENT: No sore throat. Cardiovascular: Denies chest pain. Respiratory: Denies shortness of breath. Gastrointestinal: No abdominal pain.  No nausea, no vomiting.  No diarrhea.  No constipation. Genitourinary: Negative for dysuria. Rectal bleeding  Musculoskeletal: Negative for back pain. Skin: Negative for rash. Neurological: Negative for headaches, focal weakness or numbness. All other ROS negative ____________________________________________   PHYSICAL EXAM:  VITAL SIGNS: ED Triage Vitals  Enc Vitals Group     BP 05/02/20 1055 (!) 155/66     Pulse Rate 05/02/20 1055 68     Resp 05/02/20 1055 18     Temp 05/02/20 1055 98.2 F (36.8 C)     Temp Source 05/02/20 1055 Oral     SpO2  05/02/20 1055 97 %     Weight 05/02/20 1055 136 lb (61.7 kg)     Height 05/02/20 1055 $RemoveBefor'5\' 3"'[id number]$  (1.6 m)     Head Circumference --      Peak Flow --      Pain Score 05/02/20 1031 0     Pain Loc --      Pain Edu? --      Excl. in [place]? --     Constitutional: Alert and oriented. Well appearing and in no acute distress. Eyes: Conjunctivae are normal. EOMI. Head: Atraumatic. Nose: No congestion/rhinnorhea. Mouth/Throat: Mucous membranes are moist.   Neck: No stridor. Trachea Midline. FROM Cardiovascular: Normal rate, regular rhythm. Grossly normal heart sounds.  Good peripheral circulation. Respiratory: Normal respiratory effort.  No retractions. Lungs CTAB. Gastrointestinal: Soft and nontender. No distention. No abdominal  bruits.  Musculoskeletal: No lower extremity tenderness nor edema.  No joint effusions. Neurologic:  Normal speech and language. No gross focal neurologic deficits are appreciated.  Skin:  Skin is warm, dry and intact. No rash noted. Psychiatric: Mood and affect are normal. Speech and behavior are normal. GU: + brbpr no external hemorrhoids   ____________________________________________   LABS (all labs ordered are listed, but only abnormal results are displayed)  Labs Reviewed  COMPREHENSIVE METABOLIC PANEL - Abnormal; Notable for the following components:      Result Value   Potassium 5.6 (*)    CO2 21 (*)    Glucose, Bld 230 (*)    BUN 42 (*)    Creatinine, Ser 4.70 (*)    Albumin 3.4 (*)    Alkaline Phosphatase 30 (*)    GFR, Estimated 9 (*)    All other components within normal limits  CBC - Abnormal; Notable for the following components:   RBC 3.61 (*)    Hemoglobin 10.4 (*)    HCT 32.8 (*)    RDW 16.3 (*)    All other components within normal limits  PROTIME-INR  POC OCCULT BLOOD, ED  TYPE AND SCREEN   ____________________________________________   PROCEDURES  Procedure(s) performed (including Critical Care):  .1-3 Lead EKG  Interpretation Performed by: Vanessa [place], MD Authorized by: Vanessa [place], MD     Interpretation: normal     ECG rate:  70s    ECG rate assessment: normal     Rhythm: sinus rhythm     Ectopy: none     Conduction: normal   .Critical Care Performed by: Vanessa [place], MD Authorized by: Vanessa , MD   Critical care provider statement:    Critical care time (minutes):  35   Critical care was necessary to treat or prevent imminent or life-threatening deterioration of the following conditions:  Renal failure   Critical care was time spent personally by me on the following activities:  Discussions with consultants, evaluation of patient's response to treatment, examination of patient, ordering and performing treatments and interventions, ordering and review of laboratory studies, ordering and review of radiographic studies, pulse oximetry, re-evaluation of patient's condition, obtaining history from patient or surrogate and review of old charts     ____________________________________________   INITIAL IMPRESSION / [place] / ED COURSE  Jette Lewan was evaluated in Emergency Department on 05/02/2020 for the symptoms described in the history of present illness. She was evaluated in the context of the global COVID-19 pandemic, which necessitated consideration that the patient might be at risk for infection with the SARS-CoV-2 virus that causes COVID-19. Institutional protocols and algorithms that pertain to the evaluation of patients at risk for COVID-19 are in a state of rapid change based on information released by regulatory bodies including the CDC and federal and state organizations. These policies and algorithms were followed during the patient's care in the ED.     Patient is a 80 year old who comes in with bright red blood per rectum.  Given her history of cancer and concern for lower GI bleed.  I am concerned about a polyp versus cancer and that could be  rebleeding.  Patient was last had a colonoscopy in 2019.  Also consider diverticular bleed.  I have low concern for upper GI bleed given no melena patient is hemodynamically stable and not evidence of brisk upper GI bleed such as ulcer.  Given the multiple episodes of bright red blood per  rectum hemoglobin was ordered that was 10.2.  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 Mike Gip.  I think that would be best to admit her to the hospital to trend out her hemoglobins and be seen by GI.  To know her kidney function is also elevated slightly above her baseline potassium is slightly elevated at 5.6.  We will give her 5 of insulin and bicarb.  We will keep patient on the cardiac monitor but EKG is without evidence of hyperkalemia   EKG my interpretation my read sinus rate of 65, no ST elevation, no T wave inversions, left anterior fascicular block     ____________________________________________   FINAL CLINICAL IMPRESSION(S) / ED DIAGNOSES   Final diagnoses:  Lower GI bleed  Hyperkalemia  Stage 4 chronic kidney disease (HCC)      MEDICATIONS GIVEN DURING THIS VISIT:  Medications  sodium zirconium cyclosilicate (LOKELMA) packet 10 g (has no administration in time range)  insulin aspart (novoLOG) injection 5 Units (5 Units Intravenous Given 05/02/20 1424)    And  dextrose 50 % solution 50 mL (50 mLs Intravenous Given 05/02/20 1422)  sodium bicarbonate injection 50 mEq (50 mEq Intravenous Given 05/02/20 1425)     ED Discharge Orders    None       Note:  This document was prepared using Dragon voice recognition software and may include unintentional dictation errors.   Vanessa [place], MD 05/02/20 [phone]

## 2020-05-02 NOTE — H&P (Signed)
History and Physical    Rosalene Wardrop BMW:[id number] DOB: 1940/05/09 DOA: 05/02/2020  Referring MD/NP/PA:   PCP: Gayland Curry, MD   Patient coming from:  The patient is coming from home.  At baseline, pt is independent for most of ADL.        Chief Complaint: rectal bleeding  HPI: Ryland Smoots is a 80 y.o. female with medical history significant of rectal cancer 2011 (s/p of chemo, radiation and surgery), hypertension, hyperlipidemia, diabetes mellitus, TIA, anemia, CKD stage IV, MGUS, multiple myeloma, PVD, vertigo, who presents with rectal bleeding  Pt states that she initially noted small amount of bright red blood on the toilet paper when she wiped after using the bathroom yesterday. This continued in small amounts over the day and evenin, but started to getting worse later on. She then has had multiple episodes of rectal bleeding with large blood clot and bright red blood since yesterday.  Patient does not have nausea, vomiting, abdominal pain or rectal pain.  No dizziness or lightheadedness.  Denies chest pain, cough, shortness breath.  No fever or chills.  No symptoms of UTI. She called PCP who instructed her to come to ED. She states that she is taking plavix for hx of TIA. Last dose of plavix was this AM.   ED Course: pt was found to have Hgb 10.5 on 04/16/20 --> 10.4, WBC 7.4, INR 1.0, negative Covid PCR, potassium 5.6, worsening renal function, temperature normal, blood pressure 198/57, heart rate 79, RR 18, oxygen saturation 97% on room air.  Patient is placed on MedSurg bed for observation, Dr. Haig Prophet of GI is consulted  Review of Systems:   General: no fevers, chills, no body weight gain, has fatigue HEENT: no blurry vision, hearing changes or sore throat Respiratory: no dyspnea, coughing, wheezing CV: no chest pain, no palpitations GI: no nausea, vomiting, abdominal pain, diarrhea, constipation. Has rectal bleeding GU: no dysuria, burning on urination, increased  urinary frequency, hematuria  Ext: no leg edema Neuro: no unilateral weakness, numbness, or tingling, no vision change or hearing loss Skin: no rash, no skin tear. MSK: No muscle spasm, no deformity, no limitation of range of movement in spin Heme: No easy bruising.  Travel history: No recent long distant travel.  Allergy:  Allergies  Allergen Reactions  . Succinylcholine Other (See Comments)    Severe myalgia's  . Penicillins Rash    Past Medical History:  Diagnosis Date  . Anemia   . Cancer [facility]) 2011   rectal ca/chemo /rad Lenore Manner  . Chronic kidney disease   . Diabetes mellitus without complication ([place])    type 2  . Hypertension   . Personal history of chemotherapy   . Personal history of radiation therapy   . TIA (transient ischemic attack)     Past Surgical History:  Procedure Laterality Date  . BREAST BIOPSY Left    bx/ clip-neg  . coloretcal cancer    . thyroid gland     surgery / unknown date    Social History:  reports that she has never smoked. She has never used smokeless tobacco. She reports that she does not drink alcohol. No history on file for drug use.  Family History:  Family History  Problem Relation Age of Onset  . Lung disease Sister   . Diabetes Mellitus II Paternal Grandmother   . Breast cancer Neg Hx      Prior to Admission medications   Medication Sig Start Date End Date Taking? Authorizing Provider  acetaminophen (TYLENOL) 325 MG tablet Take 325 mg by mouth every 4 (four) hours as needed.     [provider]  atorvastatin (LIPITOR) 40 MG tablet Take 40 mg by mouth daily at 6 PM.  07/27/18   [provider]  B Complex-C-Folic Acid (RENAL) 1 MG CAPS TK 1 C PO D 07/26/18   [provider]  carvedilol (COREG) 3.125 MG tablet Take 3.125 mg by mouth 2 (two) times daily with a meal.  08/02/18   [provider]  Cetirizine HCl 10 MG CAPS Take 1 capsule by mouth daily.     [provider]  clopidogrel  (PLAVIX) 75 MG tablet Take 75 mg by mouth daily.     [provider]  colestipol (COLESTID) 1 g tablet Take 1 g by mouth 2 (two) times daily.  10/04/18 12/22/19  [provider]  ferrous sulfate 325 (65 FE) MG tablet Take 325 mg by mouth 3 (three) times a week.     [provider]  Fiber Diet TABS Take 2 tablets by mouth 2 (two) times daily.     [provider]  gabapentin (NEURONTIN) 300 MG capsule Take 300 mg by mouth 2 (two) times daily.  08/31/18   [provider]  glimepiride (AMARYL) 2 MG tablet Take 2 mg by mouth daily with breakfast.  08/09/18   [provider]  glucose blood test strip 1 each by Finger Stick route 4 times daily (after meals and nightly). Use as directed 12/24/17   [provider]  HUMALOG 100 UNIT/ML injection Inject into the skin as directed. Sliding Scale 05/20/18   [provider]  hydrALAZINE (APRESOLINE) 25 MG tablet Take 25 mg by mouth daily.  08/09/18   [provider]  insulin glargine (LANTUS) 100 UNIT/ML injection Inject 35 Units into the skin 2 (two) times daily.     [provider]  INSULIN SYRINGE 1CC/29G 29G X 1/2" 1 ML MISC     [provider]  latanoprost (XALATAN) 0.005 % ophthalmic solution Place 1 drop into the left eye at bedtime.  Patient not taking: No sig reported 09/09/18   [provider]  lidocaine (LIDODERM) 5 % Place onto the skin.    [provider]  lisinopril (ZESTRIL) 10 MG tablet Take 10 mg by mouth daily.  09/09/18   [provider]  NIFEdipine (ADALAT CC) 60 MG 24 hr tablet Take 60 mg by mouth daily.  10/20/17   [provider]  SURE COMFORT PEN NEEDLES 31G X 8 MM [place]  08/28/18   [provider]    Physical Exam: Vitals:   05/02/20 1410 05/02/20 1500 05/02/20 1548 05/02/20 1623  BP: (!) 198/57 (!) 193/57 (!) 165/42 (!) 132/46  Pulse: 79 80 76 81  Resp: $Remo'18 14  16  'tpilq$ Temp:    98.1 F (36.7 C)  TempSrc:       SpO2: 98% 99% 98% 98%  Weight:      Height:       General: Not in acute distress HEENT:       Eyes: PERRL, EOMI, no scleral icterus.       ENT: No discharge from the ears and nose, no pharynx injection, no tonsillar enlargement.        Neck: No JVD, no bruit, no mass felt. Heme: No neck lymph node enlargement. Cardiac: S1/S2, RRR, No murmurs, No gallops or rubs. Respiratory: No rales, wheezing, rhonchi or rubs. GI: Soft, nondistended, nontender, no  rebound pain, no organomegaly, BS present. GU: No hematuria Ext: No pitting leg edema bilaterally. 1+DP/PT pulse bilaterally. Musculoskeletal: No joint deformities, No joint redness or warmth, no limitation of ROM in spin. Skin: No rashes.  Neuro: Alert, oriented X3, cranial nerves II-XII grossly intact, moves all extremities normally. Psych: Patient is not psychotic, no suicidal or hemocidal ideation.  Labs on Admission: I have personally reviewed following labs and imaging studies  CBC: Recent Labs  Lab 05/02/20 1102 05/02/20 1458  WBC 7.4 6.2  HGB 10.4* 9.4*  HCT 32.8* 28.8*  MCV 90.9 90.0  PLT 261 951   Basic Metabolic Panel: Recent Labs  Lab 05/02/20 1102  NA 137  K 5.6*  CL 106  CO2 21*  GLUCOSE 230*  BUN 42*  CREATININE 4.70*  CALCIUM 9.1   GFR: Estimated Creatinine Clearance: 8.3 mL/min (A) (by C-G formula based on SCr of 4.7 mg/dL (H)). Liver Function Tests: Recent Labs  Lab 05/02/20 1102  AST 17  ALT 15  ALKPHOS 30*  BILITOT 0.6  PROT 7.1  ALBUMIN 3.4*   No results for input(s): LIPASE, AMYLASE in the last 168 hours. No results for input(s): AMMONIA in the last 168 hours. Coagulation Profile: Recent Labs  Lab 05/02/20 1102  INR 1.0   Cardiac Enzymes: No results for input(s): CKTOTAL, CKMB, CKMBINDEX, TROPONINI in the last 168 hours. BNP (last 3 results) No results for input(s): PROBNP in the last 8760 hours. HbA1C: No results for input(s): HGBA1C in the last 72 hours. CBG: No results  for input(s): GLUCAP in the last 168 hours. Lipid Profile: No results for input(s): CHOL, HDL, LDLCALC, TRIG, CHOLHDL, LDLDIRECT in the last 72 hours. Thyroid Function Tests: No results for input(s): TSH, T4TOTAL, FREET4, T3FREE, THYROIDAB in the last 72 hours. Anemia Panel: No results for input(s): VITAMINB12, FOLATE, FERRITIN, TIBC, IRON, RETICCTPCT in the last 72 hours. Urine analysis: No results found for: COLORURINE, APPEARANCEUR, LABSPEC, PHURINE, GLUCOSEU, HGBUR, BILIRUBINUR, KETONESUR, PROTEINUR, UROBILINOGEN, NITRITE, LEUKOCYTESUR Sepsis Labs: $[email or web address]$ (procalcitonin:4,lacticidven:4) ) Recent Results (from the past 240 hour(s))  Resp Panel by RT-PCR (Flu A&B, Covid) Nasopharyngeal Swab     Status: None   Collection Time: 05/02/20  2:58 PM   Specimen: Nasopharyngeal Swab; Nasopharyngeal(NP) swabs in vial transport medium  Result Value Ref Range Status   SARS Coronavirus 2 by RT PCR NEGATIVE NEGATIVE Final    Comment: (NOTE) SARS-CoV-2 target nucleic acids are NOT DETECTED.  The SARS-CoV-2 RNA is generally detectable in upper respiratory specimens during the acute phase of infection. The lowest concentration of SARS-CoV-2 viral copies this assay can detect is 138 copies/mL. A negative result does not preclude SARS-Cov-2 infection and should not be used as the sole basis for treatment or other patient management decisions. A negative result may occur with  improper specimen collection/handling, submission of specimen other than nasopharyngeal swab, presence of viral mutation(s) within the areas targeted by this assay, and inadequate number of viral copies(<138 copies/mL). A negative result must be combined with clinical observations, patient history, and epidemiological information. The expected result is Negative.  Fact Sheet for Patients:  EntrepreneurPulse.com.au  Fact Sheet for Healthcare Providers:  IncredibleEmployment.be  This  test is no t yet approved or cleared by the [place] FDA and  has been authorized for detection and/or diagnosis of SARS-CoV-2 by FDA under an Emergency Use Authorization (EUA). This EUA will remain  in effect (meaning this test can be used) for the duration of the COVID-19 declaration under Section 564(b)(1) of the  Act, 21 U.S.C.section 360bbb-3(b)(1), unless the authorization is terminated  or revoked sooner.       Influenza A by PCR NEGATIVE NEGATIVE Final   Influenza B by PCR NEGATIVE NEGATIVE Final    Comment: (NOTE) The Xpert Xpress SARS-CoV-2/FLU/RSV plus assay is intended as an aid in the diagnosis of influenza from Nasopharyngeal swab specimens and should not be used as a sole basis for treatment. Nasal washings and aspirates are unacceptable for Xpert Xpress SARS-CoV-2/FLU/RSV testing.  Fact Sheet for Patients: EntrepreneurPulse.com.au  Fact Sheet for Healthcare Providers: IncredibleEmployment.be  This test is not yet approved or cleared by the [place] FDA and has been authorized for detection and/or diagnosis of SARS-CoV-2 by FDA under an Emergency Use Authorization (EUA). This EUA will remain in effect (meaning this test can be used) for the duration of the COVID-19 declaration under Section 564(b)(1) of the Act, 21 U.S.C. section 360bbb-3(b)(1), unless the authorization is terminated or revoked.  Performed at [facility], [address] [place],  [address]      Radiological Exams on Admission: No results found.   EKG: I have personally reviewed.  Sinus rhythm, QTC 405, LAD, low voltage, poor R wave progression, anteroseptal infarction pattern   Assessment/Plan Principal Problem:   Rectal bleeding Active Problems:   Anemia in stage 4 chronic kidney disease (HCC)   Chronic kidney disease, stage 4 (severe) (HCC)   Multiple myeloma (HCC)   MGUS (monoclonal gammopathy of unknown  significance)   Malignant neoplasm of rectum (HCC)   Hypertension   Hypercholesterolemia   Type II diabetes mellitus with renal manifestations (HCC)   Hyperkalemia   Rectal bleeding: Hemoglobin 10.5 on 04/16/2020 --> 10.4.  Hemodynamically stable.  Etiology is not clear.  Dr. Haig Prophet for GI is consulted  - will place in med-surg bed obs - IVF: 75 mL/hr of normal saline - Start IV pantoprazole 40 mg twice daily - Zofran IV for nausea - Avoid NSAIDs and SQ heparin - Maintain IV access (2 large bore IVs if possible). - Monitor closely and follow q6h cbc, transfuse as necessary, if Hgb<7.0 - LaB: INR, PTT and type screen -hold plavix  Anemia in stage 4 chronic kidney disease ([place]): Hemoglobin stable -Follow-up CBC  Chronic kidney disease, stage 4 (severe) ([place]): Slightly worsening.  Baseline creatinine 3.6-3.9 recently.  Her creatinine is 4.7, BUN 42. -Hold lisinopril -IV fluid as above -Follow-up by BMP -IV fluid as above  Multiple myeloma and MGUS (monoclonal gammopathy of unknown significance) -f/u with Dr. Mike Gip  Malignant neoplasm of rectum [facility]): Patient had ileostomy with reversal.  Last colonoscopy was 03/17/2017 which did not show recurrent cancer. -Follow-up with oncology  Hypertension -IV hydralazine as needed -Hold lisinopril due to worsening renal function -Continue Coreg, hydralazine, nifedipine,  Hypercholesterolemia -Lipitor  Type II diabetes mellitus with renal manifestations [facility]): Patient is taking Humalog, Amaryl, Lantus at home.  No A1c on record.  Blood sugar 230. -Decrease Lantus dose from 35 to 28 units bid -sliding scale insulin  Hyperkalemia: Potassium 5.6 due to worsening renal function -IV fluid as above -Patient was treated with 5 units of NovoLog, D50, 50 mEq of sodium bicarbonate, 10 g of leukoma         DVT ppx: SCD Code Status: Full code Family Communication:   Yes, patient's  husband  at bed side Disposition Plan:  Anticipate  discharge back to previous environment Consults called:  Dr. Haig Prophet of GI Admission status and Level of care: Med-Surg:   For  obs    Status is: Observation  The patient remains OBS appropriate and will d/c before 2 midnights.  Dispo: The patient is from: Home              Anticipated d/c is to: Home              Anticipated d/c date is: 1 day              Patient currently is not medically stable to d/c.   Difficult to place patient No             Date of Service 05/02/2020    [facility]   If 7PM-7AM, please contact night-coverage [email or web address] 05/02/2020, 4:39 PM

## 2020-05-02 NOTE — Consult Note (Signed)
Consultation  Referring Provider:     Dr Jari Pigg Admit date  05/02/20 Consult date     05/02/20    Reason for Consultation:     hematochezia         HPI:   Misty [name] is a 80 y.o. female with history of rectal cancer 2011, CKD, DM, MGUS. HTN/HL - admitted with rectal bleeding, for which GI has been consulted. Patient reports she was in her usual state of health yesterday when she noted some small amounts of bright red material on the toilet paper when she wiped after using the bathroom. This continued in small amounts over the day/evening and started to increase- noted some red material with some clotty looking material in the commode. This morning woke at 0830 and had passed a significant amount of red material with some clotty bits while in bed and on her night gown. Called PCP who instructed her to come to ED. States she has not passed less and less bloody material- last was around 130 and was a small amount of bright red blood noted on the toilet paper. She denies abdominal pain, NV- states her bowels are always irregular- alternates between diarrhea/constipation. Has not had any type of bleeding event like this in past. Denies further GI concerns and recent straining episodes. She is on plavix, last dose was this morning- has had TIA several years ago. She does not recall ever being told she had radiation proctitis. Her hgb is very stable.  PREVIOUS ENDOSCOPIES:            History of rectal cancer 2011- surgery- had ileostomy with reversal and 6w of radiation and chemotherapy  Last colonoscopy: 03/17/17 done br Dr Atilano Ina at the [facility] Findings: The perianal and digital rectal examinations were normal. Pertinent  negatives include normal sphincter tone, no palpable rectal lesions and  no anal lesion or abnormality was detected. There was a palpable coloanal anastomosis that was intact without  evidence of lesions There was a normal appearing colonic J-pouch without any evidence  of  recurrence. The area from rectum to cecum appeared normal. Impression: - The rectum to cecum is normal. - No specimens collected. Recommendation: - Repeat colonoscopy in 3 - 5 years for surveillance.     Past Medical History:  Diagnosis Date  . Anemia   . Cancer [facility]) 2011   rectal ca/chemo /rad Lenore Manner  . Chronic kidney disease   . Diabetes mellitus without complication ([place])    type 2  . Hypertension   . Personal history of chemotherapy   . Personal history of radiation therapy     Past Surgical History:  Procedure Laterality Date  . BREAST BIOPSY Left    bx/ clip-neg  . coloretcal cancer    . thyroid gland     surgery / unknown date    Family History  Problem Relation Age of Onset  . Breast cancer Neg Hx      Social History   Tobacco Use  . Smoking status: Never Smoker  . Smokeless tobacco: Never Used  Vaping Use  . Vaping Use: Never used  Substance Use Topics  . Alcohol use: Never    Prior to Admission medications   Medication Sig Start Date End Date Taking? Authorizing Provider  acetaminophen (TYLENOL) 325 MG tablet Take 325 mg by mouth every 4 (four) hours as needed.     [provider]  atorvastatin (LIPITOR) 40 MG tablet Take 40 mg by mouth daily at 6  PM.  07/27/18   [provider]  B Complex-C-Folic Acid (RENAL) 1 MG CAPS TK 1 C PO D 07/26/18   [provider]  carvedilol (COREG) 3.125 MG tablet Take 3.125 mg by mouth 2 (two) times daily with a meal.  08/02/18   [provider]  Cetirizine HCl 10 MG CAPS Take 1 capsule by mouth daily.     [provider]  clopidogrel (PLAVIX) 75 MG tablet Take 75 mg by mouth daily.     [provider]  colestipol (COLESTID) 1 g tablet Take 1 g by mouth 2 (two) times daily.  10/04/18 12/22/19  [provider]  ferrous sulfate 325 (65 FE) MG tablet Take 325 mg by mouth 3 (three) times a week.     [provider]  Fiber Diet TABS Take 2 tablets by  mouth 2 (two) times daily.     [provider]  gabapentin (NEURONTIN) 300 MG capsule Take 300 mg by mouth 2 (two) times daily.  08/31/18   [provider]  glimepiride (AMARYL) 2 MG tablet Take 2 mg by mouth daily with breakfast.  08/09/18   [provider]  glucose blood test strip 1 each by Finger Stick route 4 times daily (after meals and nightly). Use as directed 12/24/17   [provider]  HUMALOG 100 UNIT/ML injection Inject into the skin as directed. Sliding Scale 05/20/18   [provider]  hydrALAZINE (APRESOLINE) 25 MG tablet Take 25 mg by mouth daily.  08/09/18   [provider]  insulin glargine (LANTUS) 100 UNIT/ML injection Inject 35 Units into the skin 2 (two) times daily.     [provider]  INSULIN SYRINGE 1CC/29G 29G X 1/2" 1 ML MISC     [provider]  latanoprost (XALATAN) 0.005 % ophthalmic solution Place 1 drop into the left eye at bedtime.  Patient not taking: No sig reported 09/09/18   [provider]  lidocaine (LIDODERM) 5 % Place onto the skin.    [provider]  lisinopril (ZESTRIL) 10 MG tablet Take 10 mg by mouth daily.  09/09/18   [provider]  NIFEdipine (ADALAT CC) 60 MG 24 hr tablet Take 60 mg by mouth daily.  10/20/17   [provider]  SURE COMFORT PEN NEEDLES 31G X 8 MM [place]  08/28/18   [provider]    Current Facility-Administered Medications  Medication Dose Route Frequency Provider Last Rate Last Admin  . insulin aspart (novoLOG) injection 5 Units  5 Units Intravenous Once Vanessa [place], MD       And  . dextrose 50 % solution 50 mL  1 ampule Intravenous Once Vanessa Brule, MD      . sodium bicarbonate injection 50 mEq  50 mEq Intravenous Once Vanessa [place], MD       Current Outpatient Medications  Medication Sig Dispense Refill  . acetaminophen (TYLENOL) 325 MG tablet Take 325 mg by mouth every 4 (four) hours as needed.     Marland Kitchen atorvastatin  (LIPITOR) 40 MG tablet Take 40 mg by mouth daily at 6 PM.     . B Complex-C-Folic Acid (RENAL) 1 MG CAPS TK 1 C PO D    . carvedilol (COREG) 3.125 MG tablet Take 3.125 mg by mouth 2 (two) times daily with a meal.     . Cetirizine HCl 10 MG CAPS Take 1 capsule by mouth daily.     . clopidogrel (PLAVIX) 75  MG tablet Take 75 mg by mouth daily.     . colestipol (COLESTID) 1 g tablet Take 1 g by mouth 2 (two) times daily.     . ferrous sulfate 325 (65 FE) MG tablet Take 325 mg by mouth 3 (three) times a week.     . Fiber Diet TABS Take 2 tablets by mouth 2 (two) times daily.     Marland Kitchen gabapentin (NEURONTIN) 300 MG capsule Take 300 mg by mouth 2 (two) times daily.     Marland Kitchen glimepiride (AMARYL) 2 MG tablet Take 2 mg by mouth daily with breakfast.     . glucose blood test strip 1 each by Finger Stick route 4 times daily (after meals and nightly). Use as directed    . HUMALOG 100 UNIT/ML injection Inject into the skin as directed. Sliding Scale    . hydrALAZINE (APRESOLINE) 25 MG tablet Take 25 mg by mouth daily.     . insulin glargine (LANTUS) 100 UNIT/ML injection Inject 35 Units into the skin 2 (two) times daily.     . INSULIN SYRINGE 1CC/29G 29G X 1/2" 1 ML MISC     . latanoprost (XALATAN) 0.005 % ophthalmic solution Place 1 drop into the left eye at bedtime.  (Patient not taking: No sig reported)    . lidocaine (LIDODERM) 5 % Place onto the skin.    Marland Kitchen lisinopril (ZESTRIL) 10 MG tablet Take 10 mg by mouth daily.     Marland Kitchen NIFEdipine (ADALAT CC) 60 MG 24 hr tablet Take 60 mg by mouth daily.     Haig Prophet COMFORT PEN NEEDLES 31G X 8 MM MISC       Allergies as of 05/02/2020 - Review Complete 05/02/2020  Allergen Reaction Noted  . Succinylcholine Other (See Comments) 04/12/2020  . Penicillins Rash 10/31/2008     Review of Systems:    All systems reviewed and negative except where noted in HPI, with the exception of urinary incontnence- follows at [facility], has had recent botox injection and then had cystoscopy  with urethral bulking agent last week endoscopically with Dr Linus Mako (04/26/20). Follows with physiatry for chronic back pain, nephrology for CKD which has been attributed to Dm and Htn. On anaresp for MGUS (Dr Mike Gip).      Physical Exam:  Vital signs in last 24 hours: Temp:  [98.2 F (36.8 C)] 98.2 F (36.8 C) (02/23 1055) Pulse Rate:  [68] 68 (02/23 1055) Resp:  [18] 18 (02/23 1055) BP: (155)/(66) 155/66 (02/23 1055) SpO2:  [97 %] 97 % (02/23 1055) Weight:  [61.7 kg] 61.7 kg (02/23 1055)   General:   Pleasant elderly woman in NAD Head:  Normocephalic and atraumatic. Eyes:   No icterus.   Conjunctiva pink. Ears:  Normal auditory acuity. Mouth: Mucosa pink moist, no lesions. Neck:  Supple; no masses felt Lungs:  Respirations even and unlabored. Lungs clear to auscultation bilaterally.   No wheezes, crackles, or rhonchi.  Heart:  S1S2, RRR, no MRG. No edema. Abdomen:   Flat, soft, nondistended, nontender. Normal bowel sounds. No appreciable masses or hepatomegaly. No rebound signs or other peritoneal signs. Rectal:  Perianal erythema. Formed stool in rectal vault. Red material on exam but no frankly bloody material or internal masses. Mild tenderness on exam.  Msk:  MAEW x4, No clubbing or cyanosis. Strength 5/5. Symmetrical without gross deformities. Neurologic:  Alert and  oriented x4;  Cranial nerves II-XII intact.  Skin:  Warm, dry, pink without significant lesions or rashes. Psych:  Alert and cooperative. Normal affect.  LAB RESULTS: Recent Labs    05/02/20 1102  WBC 7.4  HGB 10.4*  HCT 32.8*  PLT 261   BMET Recent Labs    05/02/20 1102  NA 137  K 5.6*  CL 106  CO2 21*  GLUCOSE 230*  BUN 42*  CREATININE 4.70*  CALCIUM 9.1   LFT Recent Labs    05/02/20 1102  PROT 7.1  ALBUMIN 3.4*  AST 17  ALT 15  ALKPHOS 30*  BILITOT 0.6   PT/INR Recent Labs    05/02/20 1102  LABPROT 12.4  INR 1.0    STUDIES: No results found.     Impression / Plan:    1. Hematochezia- broad ddx, including anal outlet issues, avms, diverticular, lesions, other. Seems to be improving currently. Her hemoglobin is very stable to improved from last month. She is currently hypertensive. Considering colonoscopy/flex sig however will need to consider her chronic anticoagulation & timing.  Addendum: reviewed with Dr Haig Prophet- recommending colonoscopy for tomorrow pm to assess for site of bleeding- do note the plavix and this was discussed with patient and spouse, it is not thought that there will be any large lesions on exam since she has had regular colonoscopies. If there is a large lesion will need to repeat at another time. They are in agreement with this. Thank you very much for this consult. These services were provided by Stephens November, NP-C, in collaboration with Lollie Sails, MD, with whom I have discussed this patient in full.   Stephens November, NP-C

## 2020-05-02 NOTE — ED Triage Notes (Signed)
Pt comes with c/o rectal bleeding that started yesterday. Pt states heavy bleeding and large clots. Pt states she takes blood thinners.

## 2020-05-03 ENCOUNTER — Inpatient Hospital Stay: Payer: Medicare Other | Admitting: Anesthesiology

## 2020-05-03 ENCOUNTER — Encounter
Admission: EM | Disposition: A | Discharge status: Home / Self Care | Payer: Self-pay | Source: Home / Self Care | Attending: Internal Medicine

## 2020-05-03 ENCOUNTER — Encounter: Payer: Self-pay | Admitting: Internal Medicine

## 2020-05-03 DIAGNOSIS — Z880 Allergy status to penicillin: Secondary | ICD-10-CM | POA: Diagnosis not present

## 2020-05-03 DIAGNOSIS — K625 Hemorrhage of anus and rectum: Secondary | ICD-10-CM | POA: Diagnosis not present

## 2020-05-03 DIAGNOSIS — K921 Melena: Secondary | ICD-10-CM | POA: Diagnosis present

## 2020-05-03 DIAGNOSIS — Z923 Personal history of irradiation: Secondary | ICD-10-CM | POA: Diagnosis not present

## 2020-05-03 DIAGNOSIS — E11649 Type 2 diabetes mellitus with hypoglycemia without coma: Secondary | ICD-10-CM | POA: Diagnosis present

## 2020-05-03 DIAGNOSIS — Z794 Long term (current) use of insulin: Secondary | ICD-10-CM | POA: Diagnosis not present

## 2020-05-03 DIAGNOSIS — Z9221 Personal history of antineoplastic chemotherapy: Secondary | ICD-10-CM | POA: Diagnosis not present

## 2020-05-03 DIAGNOSIS — Z8579 Personal history of other malignant neoplasms of lymphoid, hematopoietic and related tissues: Secondary | ICD-10-CM | POA: Diagnosis not present

## 2020-05-03 DIAGNOSIS — N184 Chronic kidney disease, stage 4 (severe): Secondary | ICD-10-CM | POA: Diagnosis present

## 2020-05-03 DIAGNOSIS — E1122 Type 2 diabetes mellitus with diabetic chronic kidney disease: Secondary | ICD-10-CM | POA: Diagnosis present

## 2020-05-03 DIAGNOSIS — Z8673 Personal history of transient ischemic attack (TIA), and cerebral infarction without residual deficits: Secondary | ICD-10-CM | POA: Diagnosis not present

## 2020-05-03 DIAGNOSIS — Z888 Allergy status to other drugs, medicaments and biological substances status: Secondary | ICD-10-CM | POA: Diagnosis not present

## 2020-05-03 DIAGNOSIS — Z85048 Personal history of other malignant neoplasm of rectum, rectosigmoid junction, and anus: Secondary | ICD-10-CM | POA: Diagnosis not present

## 2020-05-03 DIAGNOSIS — Z833 Family history of diabetes mellitus: Secondary | ICD-10-CM | POA: Diagnosis not present

## 2020-05-03 DIAGNOSIS — Z23 Encounter for immunization: Secondary | ICD-10-CM | POA: Diagnosis not present

## 2020-05-03 DIAGNOSIS — E785 Hyperlipidemia, unspecified: Secondary | ICD-10-CM | POA: Diagnosis present

## 2020-05-03 DIAGNOSIS — E1151 Type 2 diabetes mellitus with diabetic peripheral angiopathy without gangrene: Secondary | ICD-10-CM | POA: Diagnosis present

## 2020-05-03 DIAGNOSIS — E875 Hyperkalemia: Secondary | ICD-10-CM | POA: Diagnosis present

## 2020-05-03 DIAGNOSIS — Z79899 Other long term (current) drug therapy: Secondary | ICD-10-CM | POA: Diagnosis not present

## 2020-05-03 DIAGNOSIS — Z7902 Long term (current) use of antithrombotics/antiplatelets: Secondary | ICD-10-CM | POA: Diagnosis not present

## 2020-05-03 DIAGNOSIS — D631 Anemia in chronic kidney disease: Secondary | ICD-10-CM | POA: Diagnosis present

## 2020-05-03 DIAGNOSIS — I129 Hypertensive chronic kidney disease with stage 1 through stage 4 chronic kidney disease, or unspecified chronic kidney disease: Secondary | ICD-10-CM | POA: Diagnosis present

## 2020-05-03 DIAGNOSIS — Z20822 Contact with and (suspected) exposure to covid-19: Secondary | ICD-10-CM | POA: Diagnosis present

## 2020-05-03 DIAGNOSIS — E7800 Pure hypercholesterolemia, unspecified: Secondary | ICD-10-CM | POA: Diagnosis present

## 2020-05-03 HISTORY — PX: COLONOSCOPY: SHX5424

## 2020-05-03 LAB — CBC
HCT: 29.8 % — ABNORMAL LOW (ref 36.0–46.0)
HCT: 28 % — ABNORMAL LOW (ref 36.0–46.0)
HCT: 29.2 % — ABNORMAL LOW (ref 36.0–46.0)
Hemoglobin: 9.3 g/dL — ABNORMAL LOW (ref 12.0–15.0)
Hemoglobin: 8.6 g/dL — ABNORMAL LOW (ref 12.0–15.0)
Hemoglobin: 9 g/dL — ABNORMAL LOW (ref 12.0–15.0)
MCH: 28.4 pg (ref 26.0–34.0)
MCH: 28.2 pg (ref 26.0–34.0)
MCH: 28.5 pg (ref 26.0–34.0)
MCHC: 31.2 g/dL (ref 30.0–36.0)
MCHC: 30.7 g/dL (ref 30.0–36.0)
MCHC: 30.8 g/dL (ref 30.0–36.0)
MCV: 91.1 fL (ref 80.0–100.0)
MCV: 91.8 fL (ref 80.0–100.0)
MCV: 92.4 fL (ref 80.0–100.0)
Platelets: 217 10*3/uL (ref 150–400)
Platelets: 213 10*3/uL (ref 150–400)
Platelets: 234 10*3/uL (ref 150–400)
RBC: 3.27 MIL/uL — ABNORMAL LOW (ref 3.87–5.11)
RBC: 3.05 MIL/uL — ABNORMAL LOW (ref 3.87–5.11)
RBC: 3.16 MIL/uL — ABNORMAL LOW (ref 3.87–5.11)
RDW: 16.3 % — ABNORMAL HIGH (ref 11.5–15.5)
RDW: 16.6 % — ABNORMAL HIGH (ref 11.5–15.5)
RDW: 16.4 % — ABNORMAL HIGH (ref 11.5–15.5)
WBC: 8 10*3/uL (ref 4.0–10.5)
WBC: 6.7 10*3/uL (ref 4.0–10.5)
WBC: 6.7 10*3/uL (ref 4.0–10.5)
nRBC: 0 % (ref 0.0–0.2)
nRBC: 0 % (ref 0.0–0.2)
nRBC: 0 % (ref 0.0–0.2)

## 2020-05-03 LAB — BASIC METABOLIC PANEL
Anion gap: 9 (ref 5–15)
BUN: 39 mg/dL — ABNORMAL HIGH (ref 8–23)
CO2: 21 mmol/L — ABNORMAL LOW (ref 22–32)
Calcium: 8.4 mg/dL — ABNORMAL LOW (ref 8.9–10.3)
Chloride: 107 mmol/L (ref 98–111)
Creatinine, Ser: 4.19 mg/dL — ABNORMAL HIGH (ref 0.44–1.00)
GFR, Estimated: 10 mL/min — ABNORMAL LOW (ref >60)
Glucose, Bld: 74 mg/dL (ref 70–99)
Potassium: 4.8 mmol/L (ref 3.5–5.1)
Sodium: 137 mmol/L (ref 135–145)

## 2020-05-03 LAB — GLUCOSE, CAPILLARY
Glucose-Capillary: 33 mg/dL — CL (ref 70–99)
Glucose-Capillary: 177 mg/dL — ABNORMAL HIGH (ref 70–99)
Glucose-Capillary: 45 mg/dL — ABNORMAL LOW (ref 70–99)
Glucose-Capillary: 52 mg/dL — ABNORMAL LOW (ref 70–99)
Glucose-Capillary: 167 mg/dL — ABNORMAL HIGH (ref 70–99)
Glucose-Capillary: 129 mg/dL — ABNORMAL HIGH (ref 70–99)
Glucose-Capillary: 39 mg/dL — CL (ref 70–99)
Glucose-Capillary: 125 mg/dL — ABNORMAL HIGH (ref 70–99)
Glucose-Capillary: 211 mg/dL — ABNORMAL HIGH (ref 70–99)

## 2020-05-03 SURGERY — COLONOSCOPY
Anesthesia: General

## 2020-05-03 MED ORDER — DEXTROSE 50 % IV SOLN
INTRAVENOUS | Status: AC
  Administered 2020-05-03: 50 mL via INTRAVENOUS
  Filled 2020-05-03: qty 50

## 2020-05-03 MED ORDER — PROPOFOL 500 MG/50ML IV EMUL
INTRAVENOUS | Status: AC
  Filled 2020-05-03: qty 50

## 2020-05-03 MED ORDER — HYDRALAZINE HCL 20 MG/ML IJ SOLN
10.0000 mg | INTRAMUSCULAR | Status: DC | PRN
  Administered 2020-05-03 – 2020-05-04 (×3): 10 mg via INTRAVENOUS
  Filled 2020-05-03 (×3): qty 1

## 2020-05-03 MED ORDER — DEXTROSE 50 % IV SOLN
INTRAVENOUS | Status: AC
  Filled 2020-05-03: qty 50

## 2020-05-03 MED ORDER — DEXTROSE 50 % IV SOLN: 1.0000 | INTRAVENOUS | Status: AC

## 2020-05-03 MED ORDER — DEXTROSE 5 % IV SOLN: INTRAVENOUS | Status: AC

## 2020-05-03 MED ORDER — SODIUM CHLORIDE 0.9 % IV SOLN: INTRAVENOUS | Status: DC

## 2020-05-03 NOTE — Progress Notes (Addendum)
Inpatient Diabetes Program Recommendations  AACE/ADA: New Consensus Statement on Inpatient Glycemic Control (2015)  Target Ranges:  Prepandial:   less than 140 mg/dL      Peak postprandial:   less than 180 mg/dL (1-2 hours)      Critically ill patients:  140 - 180 mg/dL   Lab Results  Component Value Date   GLUCAP 177 (H) 05/03/2020    Review of Glycemic Control Results for Misty [name], Misty [name] "PAT" (MRN [id number]) as of 05/03/2020 10:14  Ref. Range 05/02/2020 17:07 05/02/2020 20:50 05/03/2020 07:27 05/03/2020 07:51  Glucose-Capillary Latest Ref Range: 70 - 99 mg/dL 105 (H) 117 (H) 33 (LL) 177 (H)   Diabetes history: DM2,  A1C 6% on 02/23/20  Outpatient Diabetes medications: Amaryl 2 mg daily, Humalog SSI TID, Lantus 35 units BID  Current orders for Inpatient glycemic control: Lantus 28 units BID, Novolog 0-9 units TID & 0-5 units QHS  Inpatient Diabetes Program Recommendations:     Lantus 18 units BID to start tonight (50% home dose)  Addendum @ 1247:  45 mg/dL at 1149.  Might consider holding basal insulin for now and restarting once cbg's > 180 mg/dl.    Will continue to follow while inpatient.  Thank you, Reche Dixon, RN, BSN Diabetes Coordinator Inpatient Diabetes Program [phone] (team pager from 8a-5p)

## 2020-05-03 NOTE — Progress Notes (Addendum)
CBG 33, MD aware. 1 amp D50 given.  Addendum: rechecked ~15 minutes CBG 177

## 2020-05-03 NOTE — Anesthesia Postprocedure Evaluation (Signed)
Anesthesia Post Note  Patient: Fortune Torosian  Procedure(s) Performed: COLONOSCOPY (N/A )  Patient location during evaluation: Endoscopy Anesthesia Type: General Level of consciousness: awake and alert Pain management: pain level controlled Vital Signs Assessment: post-procedure vital signs reviewed and stable Respiratory status: spontaneous breathing, nonlabored ventilation, respiratory function stable and patient connected to nasal cannula oxygen Cardiovascular status: blood pressure returned to baseline and stable Postop Assessment: no apparent nausea or vomiting Anesthetic complications: no   No complications documented.   Last Vitals:  Vitals:   05/03/20 1412 05/03/20 1608  BP: (!) 168/51 (!) 178/52  Pulse: 82 84  Resp: 18 17  Temp: (!) 36.2 C 37.2 C  SpO2: 97% 94%    Last Pain:  Vitals:   05/03/20 1412  TempSrc: Temporal  PainSc: 0-No pain                 Martha Clan

## 2020-05-03 NOTE — Progress Notes (Signed)
1155 notified by tech of CBG 45. RN rechecks and verifies 54.  MD order to administer 1 amp D50 and initiate D5 IV fluids at 61mL/hour.  Rechecked at 30 minutes (no BG monitors available at 15 minutes)- increased to 167.

## 2020-05-03 NOTE — Progress Notes (Signed)
MD notified of 5PM BG of 39 which went up to 125 after drinking 8 oz cranberry juice.  Patient is on D5 at 56mL/hour.

## 2020-05-03 NOTE — Anesthesia Preprocedure Evaluation (Addendum)
Anesthesia Evaluation  Patient identified by MRN, date of birth, ID band Patient awake    Reviewed: Allergy & Precautions, H&P , NPO status , reviewed documented beta blocker date and time   Airway Mallampati: II  TM Distance: <3 FB Neck ROM: full    Dental no notable dental hx. (+) Teeth Intact   Pulmonary    Pulmonary exam normal        Cardiovascular hypertension, Pt. on medications Normal cardiovascular exam     Neuro/Psych TIA   GI/Hepatic   Endo/Other  diabetes, Well Controlled, Insulin Dependent  Renal/GU Renal disease     Musculoskeletal   Abdominal   Peds  Hematology  (+) Blood dyscrasia, anemia ,   Anesthesia Other Findings Past Medical History: No date: Anemia 2011: Cancer ([place])     Comment:  rectal ca/chemo /rad Lenore Manner No date: Chronic kidney disease No date: Diabetes mellitus without complication ([facility])     Comment:  type 2 No date: Hypertension No date: Personal history of chemotherapy No date: Personal history of radiation therapy No date: TIA (transient ischemic attack)  Past Surgical History: No date: BREAST BIOPSY; Left     Comment:  bx/ clip-neg 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 No date: thyroid gland     Comment:  surgery / unknown date  BMI    Body Mass Index: 24.09 kg/m      Reproductive/Obstetrics                            Anesthesia Physical Anesthesia Plan  ASA: III  Anesthesia Plan: General   Post-op Pain Management:    Induction: Intravenous  PONV Risk Score and Plan: 2 and Treatment may vary due to age or medical condition, TIVA and Propofol infusion  Airway Management Planned: Nasal Cannula and Natural Airway  Additional Equipment:   Intra-op Plan:   Post-operative Plan:   Informed Consent: I have reviewed the patients History and Physical, chart, labs and discussed the procedure including the risks, benefits and alternatives for the  proposed anesthesia with the patient or authorized representative who has indicated his/her understanding and acceptance.     Dental Advisory Given  Plan Discussed with: CRNA, Anesthesiologist and Surgeon  Anesthesia Plan Comments:        Anesthesia Quick Evaluation

## 2020-05-03 NOTE — Progress Notes (Signed)
[place] [facility] [place] NAME: Misty Misty [name]    MR#:  [id number]  DATE OF BIRTH:  [birth year]  SUBJECTIVE:  came in with rectal bleed. Unable to tolerate prep vomited. Not happy about continue to drink prep. Husband at bedside.   REVIEW OF SYSTEMS:   Review of Systems  Constitutional: Negative for chills, fever and weight loss.  HENT: Negative for ear discharge, ear pain and nosebleeds.   Eyes: Negative for blurred vision, pain and discharge.  Respiratory: Negative for sputum production, shortness of breath, wheezing and stridor.   Cardiovascular: Negative for chest pain, palpitations, orthopnea and PND.  Gastrointestinal: Positive for blood in stool and vomiting. Negative for abdominal pain, diarrhea and nausea.  Genitourinary: Negative for frequency and urgency.  Musculoskeletal: Negative for back pain and joint pain.  Neurological: Positive for weakness. Negative for sensory change, speech change and focal weakness.  Psychiatric/Behavioral: Negative for depression and hallucinations. The patient is not nervous/anxious.    Tolerating Diet:npoTolerating PT:   DRUG ALLERGIES:   Allergies  Allergen Reactions  . Succinylcholine Other (See Comments)    Severe myalgia's  . Penicillins Rash    VITALS:  Blood pressure (!) 178/52, pulse 84, temperature 99 F (37.2 C), resp. rate 17, height $RemoveBe'5\' 3"'dHOPdofUU$  (1.6 m), weight 61.7 kg, SpO2 94 %.  PHYSICAL EXAMINATION:   Physical Exam  GENERAL:  80 y.o.-year-old patient lying in the bed with no acute distress. obese LUNGS: Normal breath sounds bilaterally, no wheezing, rales, rhonchi. No use of accessory muscles of respiration.  CARDIOVASCULAR: S1, S2 normal. No murmurs, rubs, or gallops.  ABDOMEN: Soft, nontender, nondistended. Bowel sounds present. EXTREMITIES: No cyanosis, clubbing or edema b/l.    NEUROLOGIC: grossly nonfocal PSYCHIATRIC:  patient is alert and oriented x 3.  SKIN: No obvious  rash, lesion, or ulcer.   LABORATORY PANEL:  CBC Recent Labs  Lab 05/03/20 1547  WBC 6.7  HGB 9.0*  HCT 29.2*  PLT 234    Chemistries  Recent Labs  Lab 05/02/20 1102 05/03/20 0225  NA 137 137  K 5.6* 4.8  CL 106 107  CO2 21* 21*  GLUCOSE 230* 74  BUN 42* 39*  CREATININE 4.70* 4.19*  CALCIUM 9.1 8.4*  AST 17  --   ALT 15  --   ALKPHOS 30*  --   BILITOT 0.6  --    Cardiac Enzymes No results for input(s): TROPONINI in the last 168 hours. RADIOLOGY:  No results found. ASSESSMENT AND PLAN:  Misty Misty [name] is a 80 y.o. female with medical history significant of rectal cancer 2011 (s/p of chemo, radiation and surgery), hypertension, CKD-IV hyperlipidemia, diabetes mellitus, TIA, anemia, CKD stage IV, MGUS, multiple myeloma, PVD, vertigo, who presents with rectal bleeding  Rectal bleeding:  --Hemoglobin 10.5 -- 9.4 --- 9.7 -- 8.6 --- 9.0 --  Hemodynamically stable. --  Etiology is not clear.   --Dr. Haig Prophet for GI is consulted-- attempted colonoscopy poor prep. Patient not keen on drinking more prep - Zofran IV for nausea - Avoid NSAIDs and SQ heparin --hold plavix  Anemia in stage 4 chronic kidney disease ([place]): Hemoglobin stable -Follow-up CBC-- remains stable  Chronic kidney disease, stage 4 (severe) ([place]): Slightly worsening.  Baseline creatinine 3.6-3.9 recently.  Her creatinine is 4.7, BUN 42. 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 Mike Gip  Malignant  neoplasm of rectum [facility]): Patient had ileostomy with reversal.  Last colonoscopy was 03/17/2017 which did not show recurrent cancer. -Follow-up with oncology  Hypertension -IV hydralazine as needed -Hold lisinopril due to worsening renal function -Continue Coreg, hydralazine, nifedipine,  Hypercholesterolemia -Lipitor  Type II diabetes mellitus  with renal manifestations [facility]): -- Patient is taking Humalog, Amaryl, Lantus at home.  No A1c on record. -sliding scale insulin -- develop hypoglycemia received D50. Started on dextrose drip given NPO status and G.I. eval. Sugars better  Hyperkalemia: Potassium 5.6 due to worsening renal function -IV fluid as above -Patient was treated with 5 units of NovoLog, D50, 50 mEq of sodium bicarbonate, 10 g of leukoma -- 2/24--potassium 4.8    DVT ppx: SCD Code Status: Full code Family Communication:   Yes, patient's  husband  at bed side Disposition Plan:  Anticipate discharge back to previous environment Consults called:  Dr. Haig Prophet of GI Admission status and Level of Misty [name]: Med-Surg:     Procedures: colonoscopy-- unable to perform due to poor prep Level of Misty [name]: Med-Surg Status is: Inpatient  Remains inpatient appropriate because:Inpatient level of Misty [name] appropriate due to severity of illness   Dispo: The patient is from: Home              Anticipated d/c is to: Home              Patient currently is not medically stable to d/c.   Difficult to place patient No  Per G.I. recommendation continue to monitor. Will attempt to discuss regarding colonoscopy tomorrow if patient agreeable.      TOTAL TIME TAKING Misty [name] OF THIS PATIENT: 25 minutes.  >50% time spent on counselling and coordination of Misty [name]  Note: This dictation was prepared with Dragon dictation along with smaller phrase technology. Any transcriptional errors that result from this process are unintentional.  Misty Misty [name] M.D    [facility]   CC: Primary Misty [name] physician; Misty Misty [name], MDPatient ID: Misty Misty [name], female   DOB: 1940/08/02, 80 y.o.   MRN: [id number]

## 2020-05-03 NOTE — Transfer of Care (Signed)
Immediate Anesthesia Transfer of Care Note  Patient: Misty [name]  Procedure(s) Performed: COLONOSCOPY (N/A )  Patient Location: PACU  Anesthesia Type:  Level of Consciousness:   Airway & Oxygen Therapy:   Post-op Assessment:   Post vital signs:   Last Vitals:  Vitals Value Taken Time  BP    Temp    Pulse    Resp    SpO2      Last Pain:  Vitals:   05/03/20 1412  TempSrc: Temporal  PainSc: 0-No pain         Complications: No complications documented.

## 2020-05-04 ENCOUNTER — Encounter: Payer: Self-pay | Admitting: Gastroenterology

## 2020-05-04 DIAGNOSIS — K625 Hemorrhage of anus and rectum: Secondary | ICD-10-CM | POA: Diagnosis not present

## 2020-05-04 LAB — BASIC METABOLIC PANEL
Anion gap: 6 (ref 5–15)
BUN: 35 mg/dL — ABNORMAL HIGH (ref 8–23)
CO2: 23 mmol/L (ref 22–32)
Calcium: 8.9 mg/dL (ref 8.9–10.3)
Chloride: 111 mmol/L (ref 98–111)
Creatinine, Ser: 4.31 mg/dL — ABNORMAL HIGH (ref 0.44–1.00)
GFR, Estimated: 10 mL/min — ABNORMAL LOW (ref >60)
Glucose, Bld: 118 mg/dL — ABNORMAL HIGH (ref 70–99)
Potassium: 4.4 mmol/L (ref 3.5–5.1)
Sodium: 140 mmol/L (ref 135–145)

## 2020-05-04 LAB — GLUCOSE, CAPILLARY
Glucose-Capillary: 120 mg/dL — ABNORMAL HIGH (ref 70–99)
Glucose-Capillary: 104 mg/dL — ABNORMAL HIGH (ref 70–99)
Glucose-Capillary: 166 mg/dL — ABNORMAL HIGH (ref 70–99)

## 2020-05-04 LAB — CBC
HCT: 29.3 % — ABNORMAL LOW (ref 36.0–46.0)
Hemoglobin: 9.2 g/dL — ABNORMAL LOW (ref 12.0–15.0)
MCH: 28.7 pg (ref 26.0–34.0)
MCHC: 31.4 g/dL (ref 30.0–36.0)
MCV: 91.3 fL (ref 80.0–100.0)
Platelets: 216 10*3/uL (ref 150–400)
RBC: 3.21 MIL/uL — ABNORMAL LOW (ref 3.87–5.11)
RDW: 16.2 % — ABNORMAL HIGH (ref 11.5–15.5)
WBC: 7.4 10*3/uL (ref 4.0–10.5)
nRBC: 0 % (ref 0.0–0.2)

## 2020-05-04 LAB — MAGNESIUM: Magnesium: 2.1 mg/dL (ref 1.7–2.4)

## 2020-05-04 MED ORDER — INSULIN GLARGINE 100 UNIT/ML ~~LOC~~ SOLN: 10.0000 [IU] | Freq: Two times a day (BID) | SUBCUTANEOUS | 11 refills | Status: DC

## 2020-05-04 MED ORDER — LABETALOL HCL 5 MG/ML IV SOLN
10.0000 mg | Freq: Once | INTRAVENOUS | Status: AC
  Administered 2020-05-04: 10 mg via INTRAVENOUS
  Filled 2020-05-04: qty 4

## 2020-05-04 MED ORDER — HYDRALAZINE HCL 25 MG PO TABS: 25.0000 mg | ORAL_TABLET | Freq: Two times a day (BID) | ORAL | 0 refills | Status: DC

## 2020-05-04 MED ORDER — HYDRALAZINE HCL 20 MG/ML IJ SOLN
20.0000 mg | Freq: Once | INTRAMUSCULAR | Status: AC
  Administered 2020-05-04: 20 mg via INTRAVENOUS
  Filled 2020-05-04: qty 1

## 2020-05-04 MED ORDER — HYDRALAZINE HCL 25 MG PO TABS: 25.0000 mg | ORAL_TABLET | Freq: Two times a day (BID) | ORAL | Status: DC

## 2020-05-04 MED ORDER — CARVEDILOL 6.25 MG PO TABS
6.2500 mg | ORAL_TABLET | Freq: Two times a day (BID) | ORAL | Status: DC
  Administered 2020-05-04: 6.25 mg via ORAL
  Filled 2020-05-04: qty 1

## 2020-05-04 NOTE — Discharge Summary (Addendum)
Misty [name] NAME: Misty [name]    MR#:  [id number]  DATE OF BIRTH:  03-Mar-1941  DATE OF ADMISSION:  05/02/2020 ADMITTING PHYSICIAN: Fritzi Mandes, MD  DATE OF DISCHARGE: 05/04/2020  PRIMARY CARE PHYSICIAN: Gayland Curry, MD    ADMISSION DIAGNOSIS:  Hyperkalemia [E87.5] Rectal bleeding [K62.5] Lower GI bleed [K92.2] Stage 4 chronic kidney disease ([place]) [N18.4]  DISCHARGE DIAGNOSIS:  rectal bleed-- now resolved/etiology unclear  SECONDARY DIAGNOSIS:   Past Medical History:  Diagnosis Date  . Anemia   . Cancer [facility]) 2011   rectal ca/chemo /rad Misty [name]  . Chronic kidney disease   . Diabetes mellitus without complication ([place])    type 2  . Hypertension   . Personal history of chemotherapy   . Personal history of radiation therapy   . TIA (transient ischemic attack)     HOSPITAL COURSE:   Misty [name] a 80 y.o.femalewith medical history significant ofrectal cancer 2011(s/p ofchemo, radiation and surgery),hypertension, CKD-IV hyperlipidemia, diabetes mellitus, TIA, anemia, CKD stage IV, MGUS, multiple myeloma, PVD, vertigo, who presents with rectal bleeding  Rectal bleeding: --Hemoglobin 10.5 -- 9.4 --- 9.7 -- 8.6 --- 9.0--9.2 -- Hemodynamically stable. -- Etiology is not clear.  --Dr. Haig Prophet for GI is consulted-- attempted colonoscopy poor prep. Patient not keen on drinking more prep - Zofran IV for nausea - Avoid NSAIDs and SQ heparin --resumed  plavix--GI aware -- patient advised to follow-up G.I. as outpatient to consider colonoscopy as outpatient. She voiced understanding -- recommended to continue bowel prep over-the-counter to avoid constipation and G.I. bleed  Anemia in stage 4 chronic kidney disease (HCC):Hemoglobin stable -Follow-up CBC-- remains stable  Chronic kidney disease, stage 4 (severe) (HCC):Slightly worsening. Baseline creatinine 3.6-3.9 recently. Her creatinine is  4.31, BUN 42. 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 Mike Gip  Malignant neoplasm of rectum (HCC):Patient had ileostomy with reversal. Last colonoscopy was 03/17/2017 which did not show recurrent cancer. -Follow-up with oncology and GI as out pt   Hypertension -IV hydralazine as needed -Hold lisinopril due to worsening renal function -Continue Coreg, hydralazine, nifedipine  Hypercholesterolemia -Lipitor  Type II diabetes mellitus with renal manifestations [facility]): --Patient is taking Humalog, Amaryl, Lantus at home.No A1c on record. -sliding scale insulin -- patient's insulin has been reduced. She is recommended to continue checking her sugars and take insulin accordingly.  Hyperkalemia: Potassium 5.6 due to worsening renal function -IV fluid as above -Patient was treated with 5 units of NovoLog, D50, 50 mEq of sodium bicarbonate, 10 g of leukoma -- 2/24--potassium 4.8  given high mortality index ambulatory referral to palliative care has been made  DVT ppx:SCD Code Status:Full code Family Communication: none today Disposition Plan: Anticipate discharge back to previous environment Consults called:Dr. Haig Prophet of GI Admission status and Level of care:Med-Surg:   Procedures: colonoscopy-- unable to perform due to poor prep Level of care: Med-Surg Status is: Inpatient   Dispo: The patient is from: Home  Anticipated d/c is to: Home  Patient currently is medically optimized to d/c.              Difficult to place patient No   CONSULTS OBTAINED:  Treatment Team:  Lesly Rubenstein, MD  DRUG ALLERGIES:   Allergies  Allergen Reactions  . Succinylcholine Other (See Comments)    Severe myalgia's  . Penicillins Rash    DISCHARGE  MEDICATIONS:   Allergies as of  05/04/2020      Reactions   Succinylcholine Other (See Comments)   Severe myalgia's   Penicillins Rash      Medication List    STOP taking these medications   latanoprost 0.005 % ophthalmic solution Commonly known as: XALATAN   lisinopril 10 MG tablet Commonly known as: ZESTRIL     TAKE these medications   acetaminophen 325 MG tablet Commonly known as: TYLENOL Take 325 mg by mouth every 4 (four) hours as needed.   atorvastatin 40 MG tablet Commonly known as: LIPITOR Take 40 mg by mouth daily at 6 PM.   carvedilol 3.125 MG tablet Commonly known as: COREG Take 3.125 mg by mouth 2 (two) times daily with a meal.   Cetirizine HCl 10 MG Caps Take 1 capsule by mouth daily.   clopidogrel 75 MG tablet Commonly known as: PLAVIX Take 75 mg by mouth daily.   colestipol 1 g tablet Commonly known as: COLESTID Take 1 g by mouth 2 (two) times daily.   ferrous sulfate 325 (65 FE) MG tablet Take 325 mg by mouth 3 (three) times a week.   Fiber Diet Tabs Take 2 tablets by mouth 2 (two) times daily.   gabapentin 300 MG capsule Commonly known as: NEURONTIN Take 300 mg by mouth 2 (two) times daily.   glucose blood test strip 1 each by Finger Stick route 4 times daily (after meals and nightly). Use as directed   HumaLOG 100 UNIT/ML injection Generic drug: insulin lispro Inject into the skin as directed. Sliding Scale   hydrALAZINE 25 MG tablet Commonly known as: APRESOLINE Take 1 tablet (25 mg total) by mouth 2 (two) times daily. What changed: when to take this   insulin glargine 100 UNIT/ML injection Commonly known as: LANTUS Inject 0.1 mLs (10 Units total) into the skin 2 (two) times daily. What changed: how much to take   INSULIN SYRINGE 1CC/29G 29G X 1/2" 1 ML Misc   lidocaine 5 % Commonly known as: [place] onto the skin.   NIFEdipine 60 MG 24 hr tablet Commonly known as: ADALAT CC Take 60 mg by mouth daily.   Renal 1 MG Caps TK 1 C PO D   sodium  bicarbonate 650 MG tablet Take 1 tablet by mouth in the morning and at bedtime.   Sure Comfort Pen Needles 31G X 8 MM Misc Generic drug: Insulin Pen Needle       If you experience worsening of your admission symptoms, develop shortness of breath, life threatening emergency, suicidal or homicidal thoughts you must seek medical attention immediately by calling 911 or calling your MD immediately  if symptoms less severe.  You Must read complete instructions/literature along with all the possible adverse reactions/side effects for all the Medicines you take and that have been prescribed to you. Take any new Medicines after you have completely understood and accept all the possible adverse reactions/side effects.   Please note  You were cared for by a hospitalist during your hospital stay. If you have any questions about your discharge medications or the care you received while you were in the hospital after you are discharged, you can call the unit and asked to speak with the hospitalist on call if the hospitalist that took care of you is not available. Once you are discharged, your primary care physician will handle any further medical issues. Please note that NO REFILLS for any discharge medications will be authorized once you  are discharged, as it is imperative that you return to your primary care physician (or establish a relationship with a primary care physician if you do not have one) for your aftercare needs so that they can reassess your need for medications and monitor your lab values. Today   SUBJECTIVE   Patient not keen on doing prep today. She wants to go home. No more rectal bleed.  VITAL SIGNS:  Blood pressure (!) 156/53, pulse 82, temperature 98.4 F (36.9 C), resp. rate 16, height _0  (1.6 m), weight 61.7 kg, SpO2 97 %.  I/O:    Intake/Output Summary (Last 24 hours) at 05/04/2020 1323 Last data filed at 05/04/2020 1046 Gross per 24 hour  Intake 913.62 ml  Output 0 ml   Net 913.62 ml    PHYSICAL EXAMINATION:  GENERAL:  80 y.o.-year-old patient lying in the bed with no acute distress.  LUNGS: Normal breath sounds bilaterally, no wheezing, rales,rhonchi or crepitation. No use of accessory muscles of respiration.  CARDIOVASCULAR: S1, S2 normal. No murmurs, rubs, or gallops.  ABDOMEN: Soft, non-tender, non-distended. Bowel sounds present. No organomegaly or mass.  EXTREMITIES: No pedal edema, cyanosis, or clubbing.  NEUROLOGIC: Cranial nerves II through XII are intact. Muscle strength 5/5 in all extremities. Sensation intact. Gait not checked.  PSYCHIATRIC: The patient is alert and oriented x 3.  SKIN: No obvious rash, lesion, or ulcer.   DATA REVIEW:   CBC  Recent Labs  Lab 05/04/20 0519  WBC 7.4  HGB 9.2*  HCT 29.3*  PLT 216    Chemistries  Recent Labs  Lab 05/02/20 1102 05/03/20 0225 05/04/20 0701  NA 137   < > 140  K 5.6*   < > 4.4  CL 106   < > 111  CO2 21*   < > 23  GLUCOSE 230*   < > 118*  BUN 42*   < > 35*  CREATININE 4.70*   < > 4.31*  CALCIUM 9.1   < > 8.9  MG  --   --  2.1  AST 17  --   --   ALT 15  --   --   ALKPHOS 30*  --   --   BILITOT 0.6  --   --    < > = values in this interval not displayed.    Microbiology Results   Recent Results (from the past 240 hour(s))  Resp Panel by RT-PCR (Flu A&B, Covid) Nasopharyngeal Swab     Status: None   Collection Time: 05/02/20  2:58 PM   Specimen: Nasopharyngeal Swab; Nasopharyngeal(NP) swabs in vial transport medium  Result Value Ref Range Status   SARS Coronavirus 2 by RT PCR NEGATIVE NEGATIVE Final    Comment: (NOTE) SARS-CoV-2 target nucleic acids are NOT DETECTED.  The SARS-CoV-2 RNA is generally detectable in upper respiratory specimens during the acute phase of infection. The lowest concentration of SARS-CoV-2 viral copies this assay can detect is 138 copies/mL. A negative result does not preclude SARS-Cov-2 infection and should not be used as the sole basis for  treatment or other patient management decisions. A negative result may occur with  improper specimen collection/handling, submission of specimen other than nasopharyngeal swab, presence of viral mutation(s) within the areas targeted by this assay, and inadequate number of viral copies(<138 copies/mL). A negative result must be combined with clinical observations, patient history, and epidemiological information. The expected result is Negative.  Fact Sheet for Patients:  EntrepreneurPulse.com.au  Fact Sheet for Healthcare  Providers:  IncredibleEmployment.be  This test is no t yet approved or cleared by the [place] and  has been authorized for detection and/or diagnosis of SARS-CoV-2 by FDA under an Emergency Use Authorization (EUA). This EUA will remain  in effect (meaning this test can be used) for the duration of the COVID-19 declaration under Section 564(b)(1) of the Act, 21 U.S.C.section 360bbb-3(b)(1), unless the authorization is terminated  or revoked sooner.       Influenza A by PCR NEGATIVE NEGATIVE Final   Influenza B by PCR NEGATIVE NEGATIVE Final    Comment: (NOTE) The Xpert Xpress SARS-CoV-2/FLU/RSV plus assay is intended as an aid in the diagnosis of influenza from Nasopharyngeal swab specimens and should not be used as a sole basis for treatment. Nasal washings and aspirates are unacceptable for Xpert Xpress SARS-CoV-2/FLU/RSV testing.  Fact Sheet for Patients: EntrepreneurPulse.com.au  Fact Sheet for Healthcare Providers: IncredibleEmployment.be  This test is not yet approved or cleared by the [place] FDA and has been authorized for detection and/or diagnosis of SARS-CoV-2 by FDA under an Emergency Use Authorization (EUA). This EUA will remain in effect (meaning this test can be used) for the duration of the COVID-19 declaration under Section 564(b)(1) of the Act, 21  U.S.C. section 360bbb-3(b)(1), unless the authorization is terminated or revoked.  Performed at [facility], [address], [place], [place] [address]     RADIOLOGY:  No results found.   CODE STATUS:     Code Status Orders  (From admission, onward)         Start     Ordered   05/02/20 1641  Full code  Continuous        05/02/20 1641        Code Status History    This patient has a current code status but no historical code status.   Advance Care Planning Activity       TOTAL TIME TAKING CARE OF THIS PATIENT: *35* minutes.    Fritzi Mandes M.D  [facility]    CC: Primary care physician; Gayland Curry, MD

## 2020-05-04 NOTE — Discharge Instructions (Signed)
Patient advised to follow-up with G.I. Dr. Haig Prophet. She is also advised to follow-up with [facility] on her appointment. Patient advised to keep log of her sugars at home. She is informed her insulin dosage has been decreased given low sugar in the hospital. Should follow-up with her primary care if she has problems with her sugar control

## 2020-05-04 NOTE — Progress Notes (Signed)
Cross Cover Patient with blood pressures above 200. Completely asymptomatic.  Patient did miss scheduled antihypertensives yesterday.  She has been given 2 doses of labetalol and several doses hydralazine IV.   She is now receiving 20 mg of hydralazine and her daily nicardipine.

## 2020-05-04 NOTE — Progress Notes (Signed)
Pt BP elevated throughout night. Primary nurse notified Hassan Rowan NP several times throughout shift. Orders received multiple times for IV meds Pt BP remained High. Report given to daytime RN with all questions answered.

## 2020-05-04 NOTE — Plan of Care (Signed)
  Problem: Education: Goal: Knowledge of General Education information will improve Description: Including pain rating scale, medication(s)/side effects and non-pharmacologic comfort measures 05/04/2020 1406 by Milinda Hirschfeld, RN Outcome: Completed/Met 05/04/2020 1406 by Milinda Hirschfeld, RN Outcome: Adequate for Discharge 05/04/2020 1406 by Milinda Hirschfeld, RN Outcome: Adequate for Discharge   Problem: Health Behavior/Discharge Planning: Goal: Ability to manage health-related needs will improve 05/04/2020 1406 by Milinda Hirschfeld, RN Outcome: Completed/Met 05/04/2020 1406 by Milinda Hirschfeld, RN Outcome: Adequate for Discharge 05/04/2020 1406 by Milinda Hirschfeld, RN Outcome: Adequate for Discharge   Problem: Clinical Measurements: Goal: Ability to maintain clinical measurements within normal limits will improve 05/04/2020 1406 by Milinda Hirschfeld, RN Outcome: Completed/Met 05/04/2020 1406 by Milinda Hirschfeld, RN Outcome: Adequate for Discharge 05/04/2020 1406 by Milinda Hirschfeld, RN Outcome: Adequate for Discharge Goal: Will remain free from infection 05/04/2020 1406 by Milinda Hirschfeld, RN Outcome: Completed/Met 05/04/2020 1406 by Milinda Hirschfeld, RN Outcome: Adequate for Discharge 05/04/2020 1406 by Milinda Hirschfeld, RN Outcome: Adequate for Discharge Goal: Diagnostic test results will improve 05/04/2020 1406 by Milinda Hirschfeld, RN Outcome: Completed/Met 05/04/2020 1406 by Milinda Hirschfeld, RN Outcome: Adequate for Discharge 05/04/2020 1406 by Milinda Hirschfeld, RN Outcome: Adequate for Discharge Goal: Respiratory complications will improve 05/04/2020 1406 by Milinda Hirschfeld, RN Outcome: Completed/Met 05/04/2020 1406 by Milinda Hirschfeld, RN Outcome: Adequate for Discharge 05/04/2020 1406 by Milinda Hirschfeld, RN Outcome: Adequate for Discharge Goal: Cardiovascular complication will be avoided 05/04/2020 1406 by Milinda Hirschfeld, RN Outcome: Completed/Met 05/04/2020 1406 by Milinda Hirschfeld, RN Outcome: Adequate for  Discharge 05/04/2020 1406 by Milinda Hirschfeld, RN Outcome: Adequate for Discharge   Problem: Activity: Goal: Risk for activity intolerance will decrease 05/04/2020 1406 by Milinda Hirschfeld, RN Outcome: Completed/Met 05/04/2020 1406 by Milinda Hirschfeld, RN Outcome: Adequate for Discharge 05/04/2020 1406 by Milinda Hirschfeld, RN Outcome: Adequate for Discharge   Problem: Nutrition: Goal: Adequate nutrition will be maintained 05/04/2020 1406 by Milinda Hirschfeld, RN Outcome: Completed/Met 05/04/2020 1406 by Milinda Hirschfeld, RN Outcome: Adequate for Discharge 05/04/2020 1406 by Milinda Hirschfeld, RN Outcome: Adequate for Discharge   Problem: Coping: Goal: Level of anxiety will decrease 05/04/2020 1406 by Milinda Hirschfeld, RN Outcome: Completed/Met 05/04/2020 1406 by Milinda Hirschfeld, RN Outcome: Adequate for Discharge 05/04/2020 1406 by Milinda Hirschfeld, RN Outcome: Adequate for Discharge   Problem: Elimination: Goal: Will not experience complications related to bowel motility 05/04/2020 1406 by Milinda Hirschfeld, RN Outcome: Completed/Met 05/04/2020 1406 by Milinda Hirschfeld, RN Outcome: Adequate for Discharge 05/04/2020 1406 by Milinda Hirschfeld, RN Outcome: Adequate for Discharge Goal: Will not experience complications related to urinary retention 05/04/2020 1406 by Milinda Hirschfeld, RN Outcome: Completed/Met 05/04/2020 1406 by Milinda Hirschfeld, RN Outcome: Adequate for Discharge 05/04/2020 1406 by Milinda Hirschfeld, RN Outcome: Adequate for Discharge   Problem: Pain Managment: Goal: General experience of comfort will improve 05/04/2020 1406 by Milinda Hirschfeld, RN Outcome: Completed/Met 05/04/2020 1406 by Milinda Hirschfeld, RN Outcome: Adequate for Discharge 05/04/2020 1406 by Milinda Hirschfeld, RN Outcome: Adequate for Discharge   Problem: Safety: Goal: Ability to remain free from injury will improve 05/04/2020 1406 by Milinda Hirschfeld, RN Outcome: Completed/Met 05/04/2020 1406 by Milinda Hirschfeld, RN Outcome: Adequate for  Discharge 05/04/2020 1406 by Milinda Hirschfeld, RN Outcome: Adequate for Discharge   Problem: Skin Integrity: Goal: Risk for impaired skin integrity will decrease 05/04/2020 1406 by Milinda Hirschfeld, RN Outcome: Completed/Met 05/04/2020 1406 by Milinda Hirschfeld, RN Outcome: Adequate for Discharge 05/04/2020 1406 by Milinda Hirschfeld, RN Outcome: Adequate for Discharge

## 2020-05-08 ENCOUNTER — Telehealth: Payer: Self-pay

## 2020-05-08 NOTE — Telephone Encounter (Signed)
Attempted to contact patient's husband Delfino Lovett to schedule a Palliative Care consult appointment. No answer left a message to return call.

## 2020-05-15 ENCOUNTER — Inpatient Hospital Stay: Payer: Medicare Other

## 2020-05-15 ENCOUNTER — Other Ambulatory Visit: Payer: Self-pay

## 2020-05-15 ENCOUNTER — Inpatient Hospital Stay: Payer: Medicare Other | Attending: Hematology and Oncology

## 2020-05-15 VITALS — BP 152/64 | HR 78 | Temp 96.0°F | Resp 18

## 2020-05-15 DIAGNOSIS — M2550 Pain in unspecified joint: Secondary | ICD-10-CM | POA: Diagnosis not present

## 2020-05-15 DIAGNOSIS — Z801 Family history of malignant neoplasm of trachea, bronchus and lung: Secondary | ICD-10-CM | POA: Diagnosis not present

## 2020-05-15 DIAGNOSIS — R197 Diarrhea, unspecified: Secondary | ICD-10-CM | POA: Insufficient documentation

## 2020-05-15 DIAGNOSIS — Z833 Family history of diabetes mellitus: Secondary | ICD-10-CM | POA: Diagnosis not present

## 2020-05-15 DIAGNOSIS — D472 Monoclonal gammopathy: Secondary | ICD-10-CM | POA: Insufficient documentation

## 2020-05-15 DIAGNOSIS — N184 Chronic kidney disease, stage 4 (severe): Secondary | ICD-10-CM

## 2020-05-15 DIAGNOSIS — E1122 Type 2 diabetes mellitus with diabetic chronic kidney disease: Secondary | ICD-10-CM | POA: Diagnosis not present

## 2020-05-15 DIAGNOSIS — Z85048 Personal history of other malignant neoplasm of rectum, rectosigmoid junction, and anus: Secondary | ICD-10-CM | POA: Insufficient documentation

## 2020-05-15 DIAGNOSIS — Z79899 Other long term (current) drug therapy: Secondary | ICD-10-CM | POA: Insufficient documentation

## 2020-05-15 DIAGNOSIS — D631 Anemia in chronic kidney disease: Secondary | ICD-10-CM | POA: Diagnosis present

## 2020-05-15 LAB — HEMOGLOBIN AND HEMATOCRIT, BLOOD
HCT: 29.8 % — ABNORMAL LOW (ref 36.0–46.0)
Hemoglobin: 9.5 g/dL — ABNORMAL LOW (ref 12.0–15.0)

## 2020-05-15 MED ORDER — DARBEPOETIN ALFA 150 MCG/0.3ML IJ SOSY
150.0000 ug | PREFILLED_SYRINGE | Freq: Once | INTRAMUSCULAR | Status: AC
  Administered 2020-05-15: 150 ug via SUBCUTANEOUS

## 2020-05-18 ENCOUNTER — Telehealth: Payer: Self-pay | Admitting: Primary Care

## 2020-05-18 NOTE — Telephone Encounter (Signed)
Spoke with patient regarding the Palliative referral and she has declined services at this time, said she was doing much better.  I will cancel referral and notify PCP

## 2020-06-12 ENCOUNTER — Inpatient Hospital Stay: Payer: Medicare Other

## 2020-06-19 ENCOUNTER — Inpatient Hospital Stay: Payer: Medicare Other | Attending: Hematology and Oncology

## 2020-06-19 ENCOUNTER — Other Ambulatory Visit: Payer: Self-pay

## 2020-06-19 ENCOUNTER — Inpatient Hospital Stay: Payer: Medicare Other

## 2020-06-19 VITALS — BP 154/58 | HR 76 | Resp 18

## 2020-06-19 DIAGNOSIS — I5031 Acute diastolic (congestive) heart failure: Secondary | ICD-10-CM | POA: Diagnosis not present

## 2020-06-19 DIAGNOSIS — D631 Anemia in chronic kidney disease: Secondary | ICD-10-CM

## 2020-06-19 DIAGNOSIS — I132 Hypertensive heart and chronic kidney disease with heart failure and with stage 5 chronic kidney disease, or end stage renal disease: Secondary | ICD-10-CM | POA: Diagnosis not present

## 2020-06-19 DIAGNOSIS — N184 Chronic kidney disease, stage 4 (severe): Secondary | ICD-10-CM

## 2020-06-19 LAB — HEMOGLOBIN AND HEMATOCRIT, BLOOD
HCT: 28.7 % — ABNORMAL LOW (ref 36.0–46.0)
Hemoglobin: 9.1 g/dL — ABNORMAL LOW (ref 12.0–15.0)

## 2020-06-19 MED ORDER — DARBEPOETIN ALFA 150 MCG/0.3ML IJ SOSY
150.0000 ug | PREFILLED_SYRINGE | Freq: Once | INTRAMUSCULAR | Status: AC
  Administered 2020-06-19: 150 ug via SUBCUTANEOUS

## 2020-06-21 ENCOUNTER — Other Ambulatory Visit: Payer: Self-pay

## 2020-06-21 ENCOUNTER — Inpatient Hospital Stay
Admission: EM | Admit: 2020-06-21 | Discharge: 2020-06-23 | DRG: 291 | Disposition: A | Discharge status: Home Health | Payer: Medicare Other | Attending: Internal Medicine | Admitting: Internal Medicine

## 2020-06-21 ENCOUNTER — Emergency Department: Payer: Medicare Other

## 2020-06-21 DIAGNOSIS — J9601 Acute respiratory failure with hypoxia: Secondary | ICD-10-CM | POA: Diagnosis present

## 2020-06-21 DIAGNOSIS — Z880 Allergy status to penicillin: Secondary | ICD-10-CM

## 2020-06-21 DIAGNOSIS — D631 Anemia in chronic kidney disease: Secondary | ICD-10-CM | POA: Diagnosis present

## 2020-06-21 DIAGNOSIS — I5031 Acute diastolic (congestive) heart failure: Secondary | ICD-10-CM | POA: Diagnosis present

## 2020-06-21 DIAGNOSIS — I248 Other forms of acute ischemic heart disease: Secondary | ICD-10-CM | POA: Diagnosis present

## 2020-06-21 DIAGNOSIS — C9000 Multiple myeloma not having achieved remission: Secondary | ICD-10-CM | POA: Diagnosis present

## 2020-06-21 DIAGNOSIS — D472 Monoclonal gammopathy: Secondary | ICD-10-CM | POA: Diagnosis present

## 2020-06-21 DIAGNOSIS — Z85048 Personal history of other malignant neoplasm of rectum, rectosigmoid junction, and anus: Secondary | ICD-10-CM | POA: Diagnosis not present

## 2020-06-21 DIAGNOSIS — R7989 Other specified abnormal findings of blood chemistry: Secondary | ICD-10-CM | POA: Diagnosis present

## 2020-06-21 DIAGNOSIS — R0600 Dyspnea, unspecified: Secondary | ICD-10-CM

## 2020-06-21 DIAGNOSIS — Z8673 Personal history of transient ischemic attack (TIA), and cerebral infarction without residual deficits: Secondary | ICD-10-CM

## 2020-06-21 DIAGNOSIS — E1122 Type 2 diabetes mellitus with diabetic chronic kidney disease: Secondary | ICD-10-CM | POA: Diagnosis present

## 2020-06-21 DIAGNOSIS — Z923 Personal history of irradiation: Secondary | ICD-10-CM | POA: Diagnosis not present

## 2020-06-21 DIAGNOSIS — I5033 Acute on chronic diastolic (congestive) heart failure: Secondary | ICD-10-CM | POA: Diagnosis present

## 2020-06-21 DIAGNOSIS — Z833 Family history of diabetes mellitus: Secondary | ICD-10-CM

## 2020-06-21 DIAGNOSIS — J9600 Acute respiratory failure, unspecified whether with hypoxia or hypercapnia: Secondary | ICD-10-CM | POA: Diagnosis present

## 2020-06-21 DIAGNOSIS — Z79899 Other long term (current) drug therapy: Secondary | ICD-10-CM

## 2020-06-21 DIAGNOSIS — I132 Hypertensive heart and chronic kidney disease with heart failure and with stage 5 chronic kidney disease, or end stage renal disease: Secondary | ICD-10-CM | POA: Diagnosis present

## 2020-06-21 DIAGNOSIS — Z9221 Personal history of antineoplastic chemotherapy: Secondary | ICD-10-CM

## 2020-06-21 DIAGNOSIS — Z888 Allergy status to other drugs, medicaments and biological substances status: Secondary | ICD-10-CM | POA: Diagnosis not present

## 2020-06-21 DIAGNOSIS — N2581 Secondary hyperparathyroidism of renal origin: Secondary | ICD-10-CM | POA: Diagnosis present

## 2020-06-21 DIAGNOSIS — N179 Acute kidney failure, unspecified: Secondary | ICD-10-CM | POA: Diagnosis present

## 2020-06-21 DIAGNOSIS — I350 Nonrheumatic aortic (valve) stenosis: Secondary | ICD-10-CM | POA: Diagnosis present

## 2020-06-21 DIAGNOSIS — Z7901 Long term (current) use of anticoagulants: Secondary | ICD-10-CM | POA: Diagnosis not present

## 2020-06-21 DIAGNOSIS — E785 Hyperlipidemia, unspecified: Secondary | ICD-10-CM | POA: Diagnosis present

## 2020-06-21 DIAGNOSIS — R112 Nausea with vomiting, unspecified: Secondary | ICD-10-CM | POA: Diagnosis present

## 2020-06-21 DIAGNOSIS — N185 Chronic kidney disease, stage 5: Secondary | ICD-10-CM | POA: Diagnosis present

## 2020-06-21 DIAGNOSIS — E1129 Type 2 diabetes mellitus with other diabetic kidney complication: Secondary | ICD-10-CM | POA: Diagnosis present

## 2020-06-21 DIAGNOSIS — Z794 Long term (current) use of insulin: Secondary | ICD-10-CM | POA: Diagnosis not present

## 2020-06-21 DIAGNOSIS — R0602 Shortness of breath: Secondary | ICD-10-CM | POA: Diagnosis not present

## 2020-06-21 DIAGNOSIS — Z20822 Contact with and (suspected) exposure to covid-19: Secondary | ICD-10-CM | POA: Diagnosis present

## 2020-06-21 LAB — BASIC METABOLIC PANEL
Anion gap: 11 (ref 5–15)
BUN: 52 mg/dL — ABNORMAL HIGH (ref 8–23)
CO2: 24 mmol/L (ref 22–32)
Calcium: 9.4 mg/dL (ref 8.9–10.3)
Chloride: 107 mmol/L (ref 98–111)
Creatinine, Ser: 5.75 mg/dL — ABNORMAL HIGH (ref 0.44–1.00)
GFR, Estimated: 7 mL/min — ABNORMAL LOW (ref >60)
Glucose, Bld: 111 mg/dL — ABNORMAL HIGH (ref 70–99)
Potassium: 4.2 mmol/L (ref 3.5–5.1)
Sodium: 142 mmol/L (ref 135–145)

## 2020-06-21 LAB — PROTIME-INR
INR: 1 (ref 0.8–1.2)
Prothrombin Time: 12.9 seconds (ref 11.4–15.2)

## 2020-06-21 LAB — CBC WITH DIFFERENTIAL/PLATELET
Abs Immature Granulocytes: 0.1 10*3/uL — ABNORMAL HIGH (ref 0.00–0.07)
Basophils Absolute: 0 10*3/uL (ref 0.0–0.1)
Basophils Relative: 0 %
Eosinophils Absolute: 0 10*3/uL (ref 0.0–0.5)
Eosinophils Relative: 0 %
HCT: 30 % — ABNORMAL LOW (ref 36.0–46.0)
Hemoglobin: 9.5 g/dL — ABNORMAL LOW (ref 12.0–15.0)
Immature Granulocytes: 1 %
Lymphocytes Relative: 8 %
Lymphs Abs: 0.9 10*3/uL (ref 0.7–4.0)
MCH: 27.6 pg (ref 26.0–34.0)
MCHC: 31.7 g/dL (ref 30.0–36.0)
MCV: 87.2 fL (ref 80.0–100.0)
Monocytes Absolute: 0.2 10*3/uL (ref 0.1–1.0)
Monocytes Relative: 2 %
Neutro Abs: 9.4 10*3/uL — ABNORMAL HIGH (ref 1.7–7.7)
Neutrophils Relative %: 89 %
Platelets: 265 10*3/uL (ref 150–400)
RBC: 3.44 MIL/uL — ABNORMAL LOW (ref 3.87–5.11)
RDW: 15.8 % — ABNORMAL HIGH (ref 11.5–15.5)
WBC: 10.7 10*3/uL — ABNORMAL HIGH (ref 4.0–10.5)
nRBC: 0 % (ref 0.0–0.2)

## 2020-06-21 LAB — HEPARIN LEVEL (UNFRACTIONATED): Heparin Unfractionated: 0.18 IU/mL — ABNORMAL LOW (ref 0.30–0.70)

## 2020-06-21 LAB — TROPONIN I (HIGH SENSITIVITY)
Troponin I (High Sensitivity): 257 ng/L (ref <18)
Troponin I (High Sensitivity): 313 ng/L (ref <18)
Troponin I (High Sensitivity): 362 ng/L (ref <18)
Troponin I (High Sensitivity): 310 ng/L (ref <18)

## 2020-06-21 LAB — RESP PANEL BY RT-PCR (FLU A&B, COVID) ARPGX2
Influenza A by PCR: NEGATIVE
Influenza B by PCR: NEGATIVE
SARS Coronavirus 2 by RT PCR: NEGATIVE

## 2020-06-21 LAB — APTT: aPTT: 27 seconds (ref 24–36)

## 2020-06-21 LAB — BRAIN NATRIURETIC PEPTIDE: B Natriuretic Peptide: 709.6 pg/mL — ABNORMAL HIGH (ref 0.0–100.0)

## 2020-06-21 LAB — D-DIMER, QUANTITATIVE: D-Dimer, Quant: 0.63 ug/mL-FEU — ABNORMAL HIGH (ref 0.00–0.50)

## 2020-06-21 IMAGING — NM NM PULMONARY PERF PARTICULATE
1 series · 8 of 8 positions shown · non-contrast
Comparison: Same day chest radiograph

CLINICAL DATA: PE suspected, shortness of breath

EXAM:
NUCLEAR MEDICINE PERFUSION LUNG SCAN
TECHNIQUE: Perfusion images were obtained in multiple projections after
intravenous injection of radiopharmaceutical.
Ventilation scans intentionally deferred if perfusion scan and chest
x-ray adequate for interpretation during COVID 19 epidemic.
RADIOPHARMACEUTICALS:  4.25 mCi [1M] MAA IV

[Series 1000: lung perfusion · 1.65mm/px · 4 acquisitions, 8 frames shown]
[im 1/4]
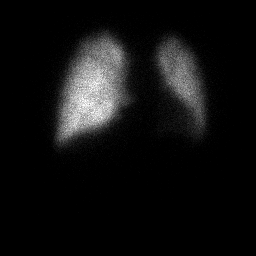
[im 1/4]
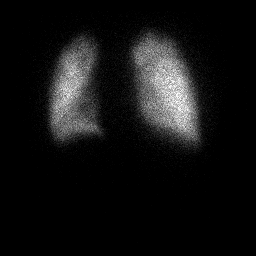
[im 2/4]
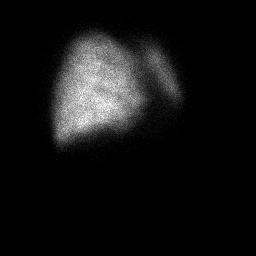
[im 2/4]
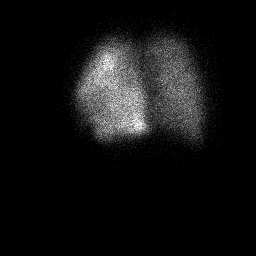
[im 3/4]
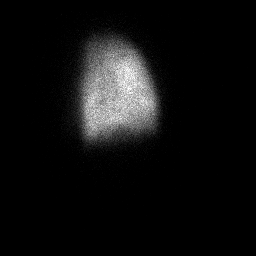
[im 3/4]
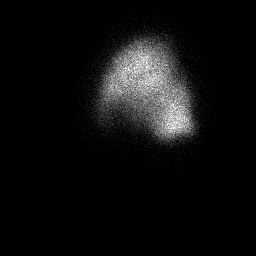
[im 4/4]
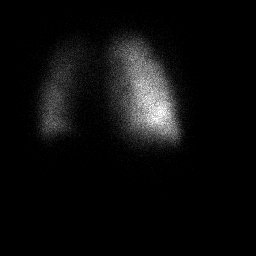
[im 4/4]
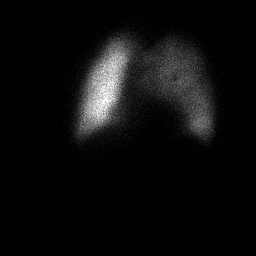

[8 of 8 positions shown; findings below may reference images not displayed]

FINDINGS: Cardiomegaly.  No suspicious perfusion defect identified.
IMPRESSION: Very low probability examination for pulmonary embolism by modified
perfusion only PIOPED criteria (PE absent).

## 2020-06-21 IMAGING — CR DG CHEST 2V
2 series · 2 of 2 positions shown · non-contrast
Comparison: None.

CLINICAL DATA: Shortness of breath

EXAM:
CHEST - 2 VIEW

[chest lat]
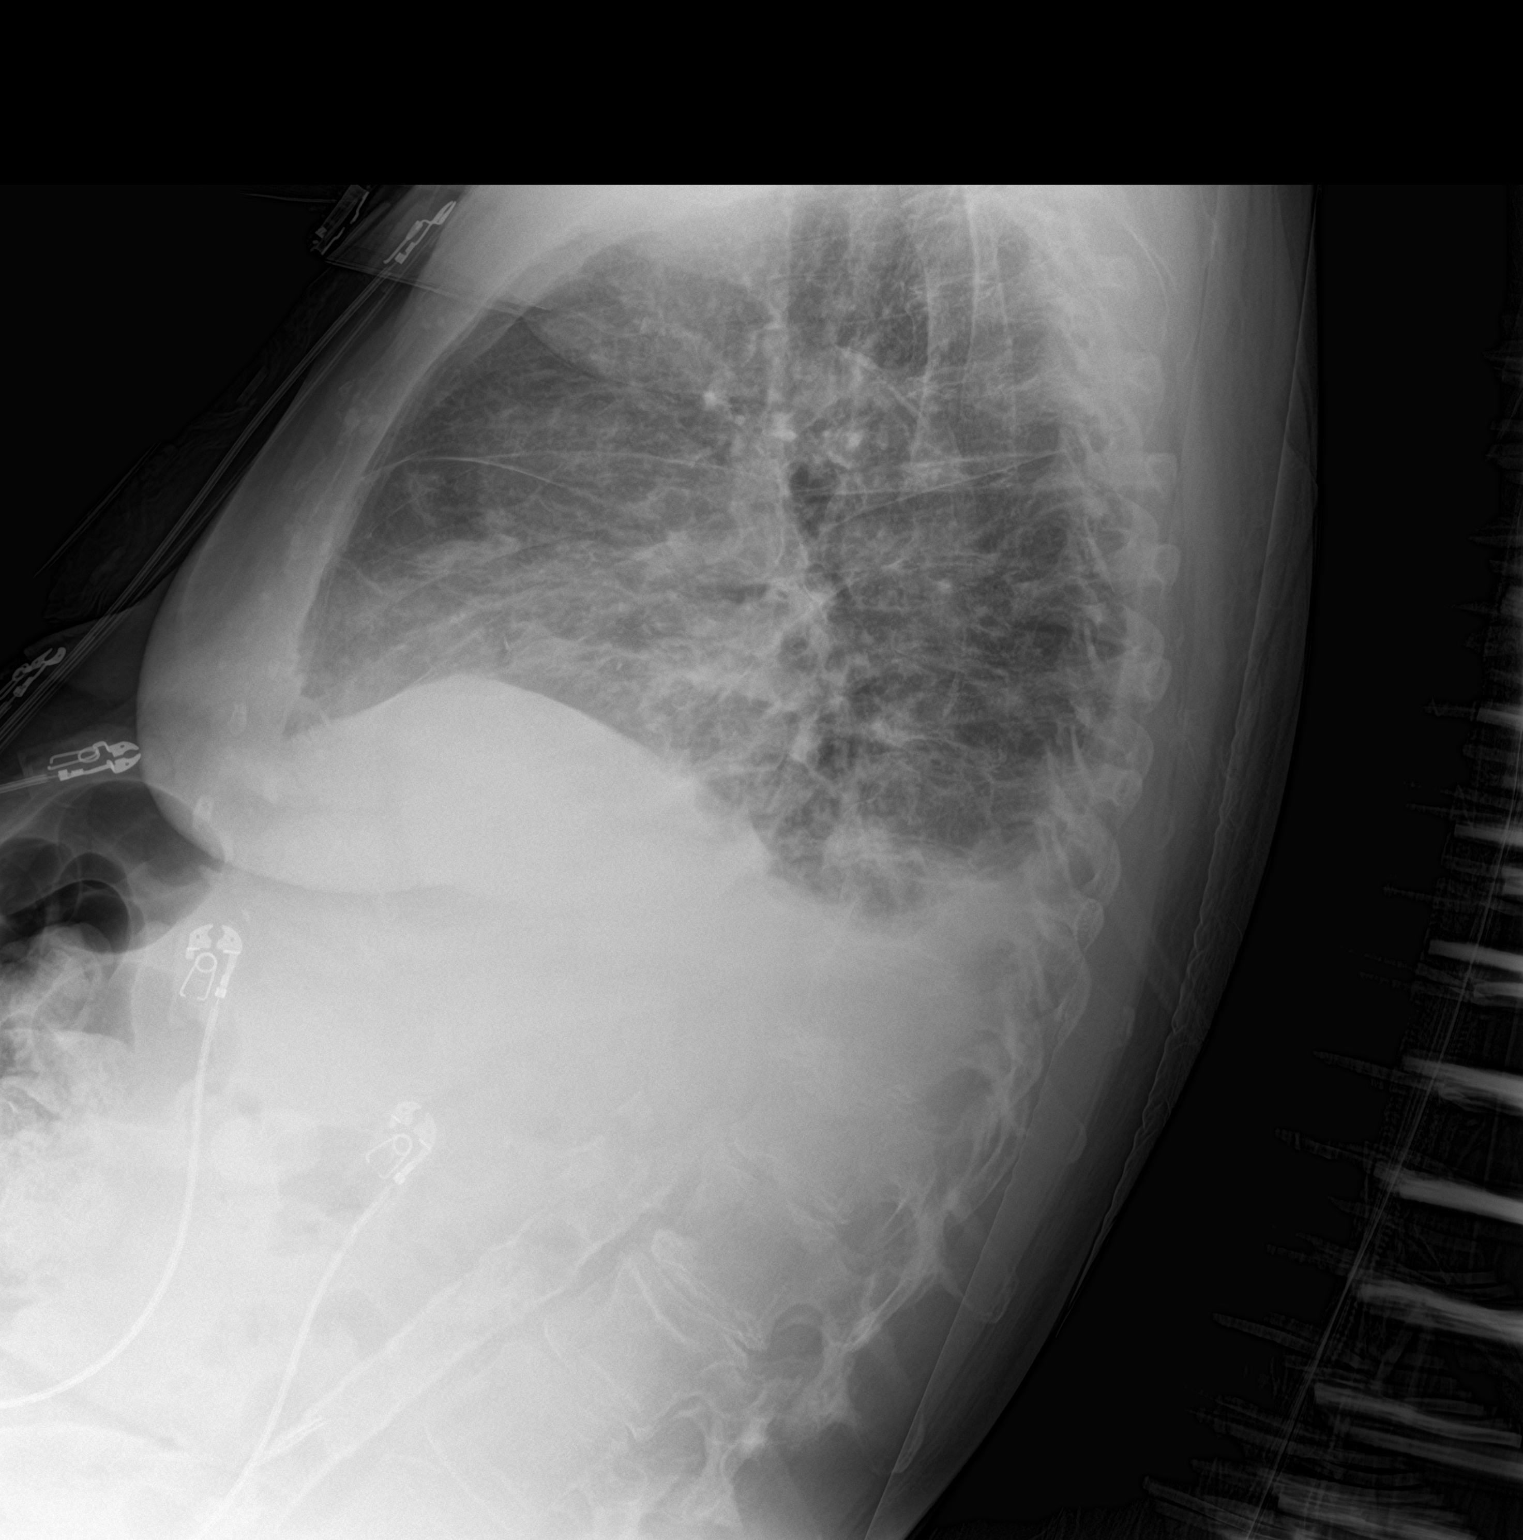

[chest ap]
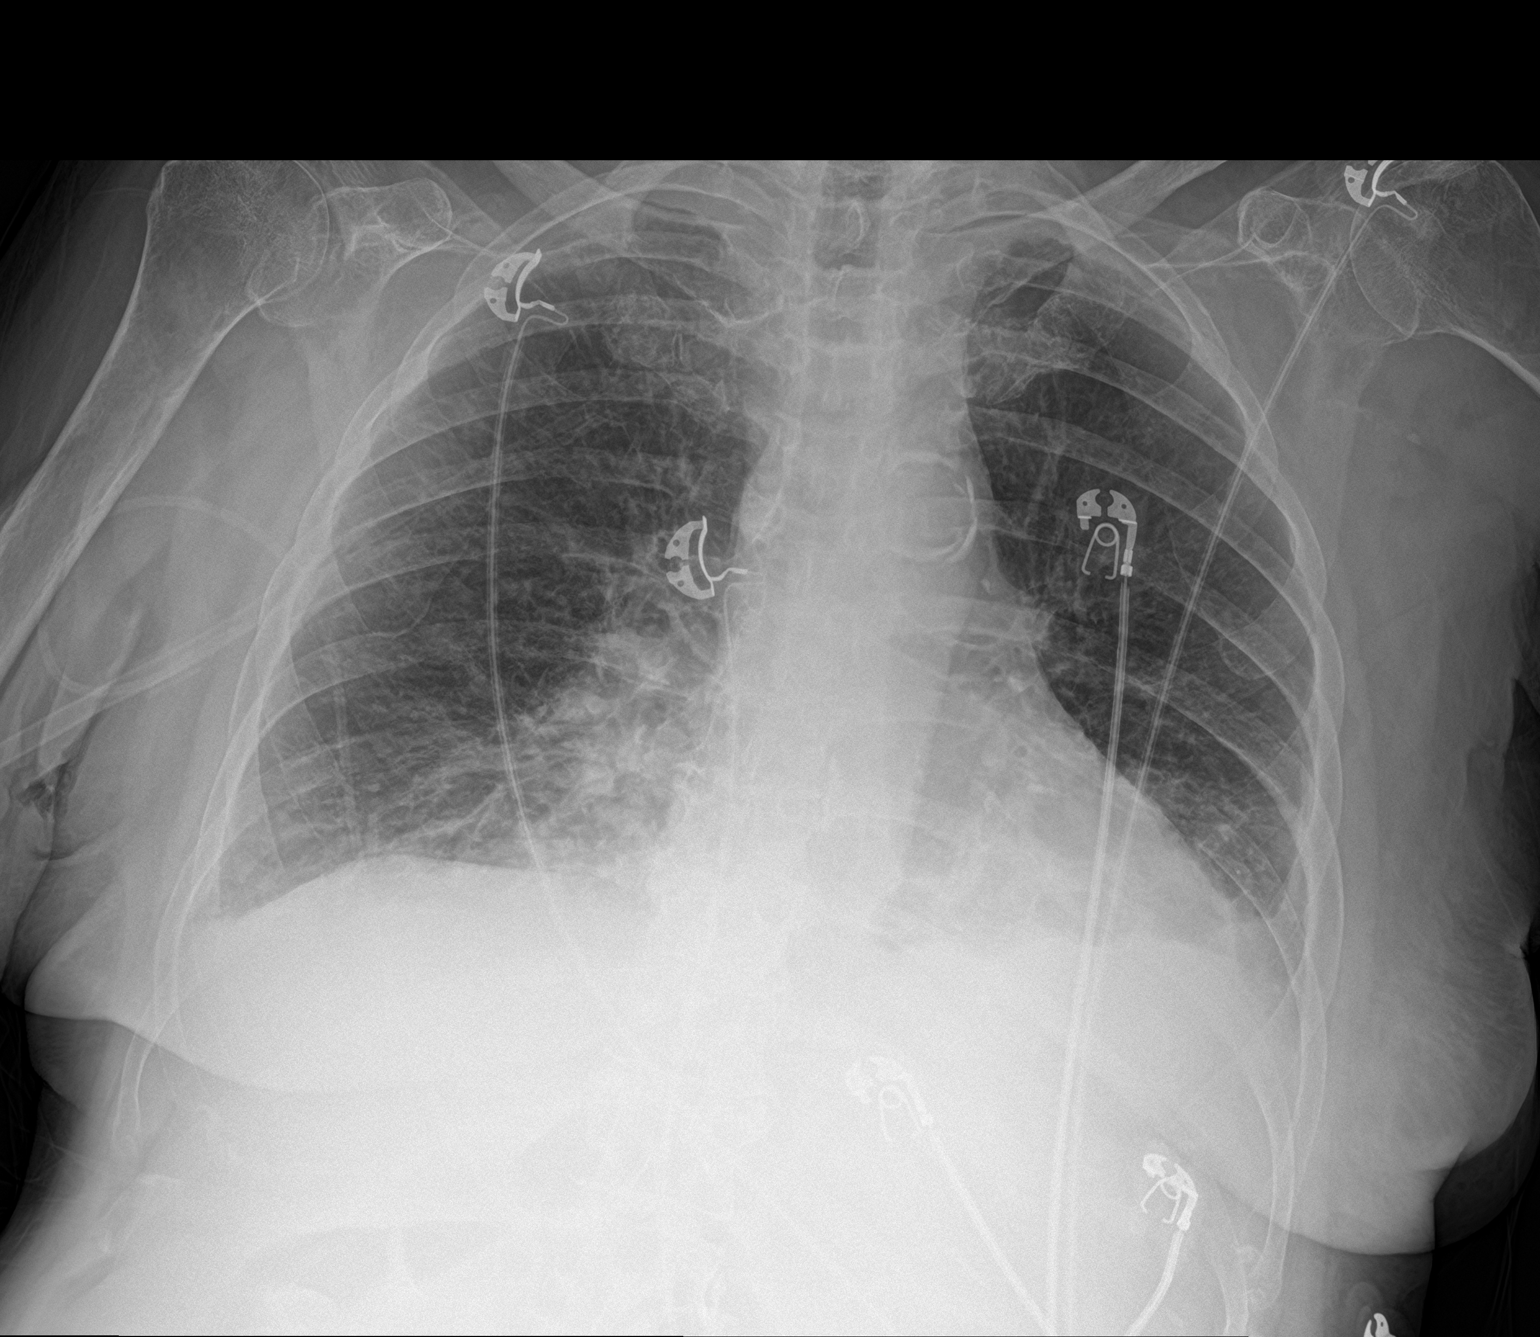

[2 of 2 positions shown; findings below may reference images not displayed]

FINDINGS: Heart is normal size. Aortic atherosclerosis. Bibasilar airspace
opacities. Small bilateral pleural effusions. No acute bony
abnormality.
IMPRESSION: Small bilateral pleural effusions with bibasilar atelectasis or
infiltrates.

## 2020-06-21 MED ORDER — HYDRALAZINE HCL 25 MG PO TABS
25.0000 mg | ORAL_TABLET | Freq: Two times a day (BID) | ORAL | Status: DC
  Administered 2020-06-21 – 2020-06-22 (×2): 25 mg via ORAL
  Filled 2020-06-21 (×2): qty 1

## 2020-06-21 MED ORDER — ASPIRIN 81 MG PO CHEW
324.0000 mg | CHEWABLE_TABLET | Freq: Once | ORAL | Status: AC
  Administered 2020-06-21: 324 mg via ORAL
  Filled 2020-06-21: qty 4

## 2020-06-21 MED ORDER — CLOPIDOGREL BISULFATE 75 MG PO TABS
75.0000 mg | ORAL_TABLET | Freq: Every day | ORAL | Status: DC
  Administered 2020-06-22 – 2020-06-23 (×2): 75 mg via ORAL
  Filled 2020-06-21: qty 1

## 2020-06-21 MED ORDER — SODIUM BICARBONATE 650 MG PO TABS
650.0000 mg | ORAL_TABLET | Freq: Two times a day (BID) | ORAL | Status: DC
  Administered 2020-06-21 – 2020-06-23 (×4): 650 mg via ORAL
  Filled 2020-06-21 (×6): qty 1

## 2020-06-21 MED ORDER — CALCIUM CARBONATE ANTACID 500 MG PO CHEW: 750.0000 mg | CHEWABLE_TABLET | Freq: Every day | ORAL | Status: DC | PRN

## 2020-06-21 MED ORDER — SODIUM CHLORIDE 0.9 % IV SOLN: 250.0000 mL | INTRAVENOUS | Status: DC | PRN

## 2020-06-21 MED ORDER — FUROSEMIDE 10 MG/ML IJ SOLN
80.0000 mg | Freq: Two times a day (BID) | INTRAMUSCULAR | Status: DC
  Administered 2020-06-21: 80 mg via INTRAVENOUS
  Filled 2020-06-21: qty 8

## 2020-06-21 MED ORDER — ATORVASTATIN CALCIUM 20 MG PO TABS
40.0000 mg | ORAL_TABLET | Freq: Every day | ORAL | Status: DC
  Administered 2020-06-22: 40 mg via ORAL
  Filled 2020-06-21: qty 2

## 2020-06-21 MED ORDER — CALCIUM POLYCARBOPHIL 625 MG PO TABS
1250.0000 mg | ORAL_TABLET | Freq: Two times a day (BID) | ORAL | Status: DC
  Administered 2020-06-22 – 2020-06-23 (×3): 1250 mg via ORAL
  Filled 2020-06-21 (×5): qty 2

## 2020-06-21 MED ORDER — LABETALOL HCL 5 MG/ML IV SOLN
10.0000 mg | Freq: Once | INTRAVENOUS | Status: DC
  Filled 2020-06-21: qty 4

## 2020-06-21 MED ORDER — SODIUM CHLORIDE 0.9% FLUSH
3.0000 mL | Freq: Two times a day (BID) | INTRAVENOUS | Status: DC
  Administered 2020-06-21 – 2020-06-23 (×4): 3 mL via INTRAVENOUS

## 2020-06-21 MED ORDER — HEPARIN BOLUS VIA INFUSION
3700.0000 [IU] | Freq: Once | INTRAVENOUS | Status: AC
  Administered 2020-06-21: 3700 [IU] via INTRAVENOUS
  Filled 2020-06-21: qty 3700

## 2020-06-21 MED ORDER — SODIUM CHLORIDE 0.9% FLUSH: 3.0000 mL | INTRAVENOUS | Status: DC | PRN

## 2020-06-21 MED ORDER — CALCIUM POLYCARBOPHIL 625 MG PO TABS: 1250.0000 mg | ORAL_TABLET | Freq: Every day | ORAL | Status: DC

## 2020-06-21 MED ORDER — CARVEDILOL 3.125 MG PO TABS
3.1250 mg | ORAL_TABLET | Freq: Two times a day (BID) | ORAL | Status: DC
  Administered 2020-06-21 – 2020-06-22 (×2): 3.125 mg via ORAL
  Filled 2020-06-21 (×2): qty 1

## 2020-06-21 MED ORDER — ONDANSETRON HCL 4 MG/2ML IJ SOLN
4.0000 mg | Freq: Four times a day (QID) | INTRAMUSCULAR | Status: DC | PRN
Start: 2020-06-21 — End: 2020-06-23

## 2020-06-21 MED ORDER — HEPARIN BOLUS VIA INFUSION
2000.0000 [IU] | Freq: Once | INTRAVENOUS | Status: AC
  Administered 2020-06-22: 2000 [IU] via INTRAVENOUS
  Filled 2020-06-21: qty 2000

## 2020-06-21 MED ORDER — FIBER DIET PO TABS: 2.0000 | ORAL_TABLET | Freq: Two times a day (BID) | ORAL | Status: DC

## 2020-06-21 MED ORDER — FERROUS SULFATE 325 (65 FE) MG PO TABS
325.0000 mg | ORAL_TABLET | ORAL | Status: DC
  Administered 2020-06-22: 325 mg via ORAL

## 2020-06-21 MED ORDER — LORATADINE 10 MG PO TABS
10.0000 mg | ORAL_TABLET | Freq: Every day | ORAL | Status: DC
  Administered 2020-06-22 – 2020-06-23 (×2): 10 mg via ORAL
  Filled 2020-06-21 (×2): qty 1

## 2020-06-21 MED ORDER — ACETAMINOPHEN 325 MG PO TABS: 650.0000 mg | ORAL_TABLET | Freq: Four times a day (QID) | ORAL | Status: DC | PRN

## 2020-06-21 MED ORDER — FUROSEMIDE 10 MG/ML IJ SOLN
40.0000 mg | Freq: Once | INTRAMUSCULAR | Status: AC
  Administered 2020-06-21: 40 mg via INTRAVENOUS
  Filled 2020-06-21: qty 4

## 2020-06-21 MED ORDER — GABAPENTIN 300 MG PO CAPS
300.0000 mg | ORAL_CAPSULE | Freq: Two times a day (BID) | ORAL | Status: DC
  Administered 2020-06-21 – 2020-06-23 (×4): 300 mg via ORAL
  Filled 2020-06-21 (×4): qty 1

## 2020-06-21 MED ORDER — HEPARIN (PORCINE) 25000 UT/250ML-% IV SOLN
900.0000 [IU]/h | INTRAVENOUS | Status: DC
  Administered 2020-06-21: 750 [IU]/h via INTRAVENOUS
  Filled 2020-06-21 (×2): qty 250

## 2020-06-21 MED ORDER — LIDOCAINE 5 % EX PTCH
1.0000 | MEDICATED_PATCH | CUTANEOUS | Status: DC
  Administered 2020-06-22: 1 via TRANSDERMAL
  Filled 2020-06-21 (×3): qty 1

## 2020-06-21 MED ORDER — RENA-VITE PO TABS
1.0000 | ORAL_TABLET | Freq: Every morning | ORAL | Status: DC
  Administered 2020-06-22 – 2020-06-23 (×2): 1 via ORAL
  Filled 2020-06-21 (×2): qty 1

## 2020-06-21 MED ORDER — ATORVASTATIN CALCIUM 20 MG PO TABS: 40.0000 mg | ORAL_TABLET | Freq: Every day | ORAL | Status: DC

## 2020-06-21 MED ORDER — LABETALOL HCL 5 MG/ML IV SOLN
20.0000 mg | INTRAVENOUS | Status: DC | PRN
  Administered 2020-06-21 – 2020-06-23 (×3): 20 mg via INTRAVENOUS
  Filled 2020-06-21 (×3): qty 4

## 2020-06-21 MED ORDER — RENA-VITE PO TABS
1.0000 | ORAL_TABLET | Freq: Every day | ORAL | Status: DC
  Administered 2020-06-22 – 2020-06-23 (×2): 1 via ORAL
  Filled 2020-06-21 (×2): qty 1

## 2020-06-21 MED ORDER — LISINOPRIL 10 MG PO TABS
10.0000 mg | ORAL_TABLET | Freq: Every day | ORAL | Status: DC
  Filled 2020-06-21: qty 1

## 2020-06-21 MED ORDER — TECHNETIUM TO 99M ALBUMIN AGGREGATED
4.2400 | Freq: Once | INTRAVENOUS | Status: AC | PRN
  Administered 2020-06-21: 4.24 via INTRAVENOUS

## 2020-06-21 MED ORDER — NIFEDIPINE ER 60 MG PO TB24
60.0000 mg | ORAL_TABLET | Freq: Every day | ORAL | Status: DC
  Administered 2020-06-21 – 2020-06-22 (×2): 60 mg via ORAL
  Filled 2020-06-21 (×3): qty 1

## 2020-06-21 MED ORDER — ALBUTEROL SULFATE HFA 108 (90 BASE) MCG/ACT IN AERS
2.0000 | INHALATION_SPRAY | RESPIRATORY_TRACT | Status: DC | PRN
  Filled 2020-06-21: qty 6.7

## 2020-06-21 NOTE — ED Notes (Signed)
[facility] going [facility] V/Q scan.

## 2020-06-21 NOTE — ED Notes (Signed)
Message sent to pharmacy for missing nifedipine dose, advising is preparing and will send when ready.

## 2020-06-21 NOTE — ED Notes (Signed)
Second troponin collected.

## 2020-06-21 NOTE — ED Notes (Signed)
[facility] called. They should be able to perform V/Q scan today between 2:30-3pm once they get the medicine from [place]. They will call back if there is any change to the plan.

## 2020-06-21 NOTE — ED Provider Notes (Addendum)
[facility] Emergency Department Provider Note    Event Date/Time   First MD Initiated Contact with Patient 06/21/20 1035     (approximate)  I have reviewed the triage vital signs and the nursing notes.   HISTORY  Chief Complaint Shortness of Breath    HPI Misty [name] is a 80 y.o. female below listed past medical history presents to the ER for evaluation of shortness of breath worsening orthopnea over the past 24 hours.  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  Denies any pain.  Was found by EMS to be hypoxic this morning placed on nonrebreather.  Denies any history of COPD or congestive heart failure.    Past Medical History:  Diagnosis Date  . Anemia   . Cancer [facility]) 2011   rectal ca/chemo /rad Lenore Manner  . Chronic kidney disease   . Diabetes mellitus without complication ([place])    type 2  . Hypertension   . Personal history of chemotherapy   . Personal history of radiation therapy   . TIA (transient ischemic attack)    Family History  Problem Relation Age of Onset  . Lung disease Sister   . Diabetes Mellitus II Paternal Grandmother   . Breast cancer Neg Hx    Past Surgical History:  Procedure Laterality Date  . BREAST BIOPSY Left    bx/ clip-neg  . COLONOSCOPY N/A 05/03/2020   Procedure: COLONOSCOPY;  Surgeon: Lesly Rubenstein, MD;  Location: [facility] ENDOSCOPY;  Service: Endoscopy;  Laterality: N/A;  . coloretcal cancer    . thyroid gland     surgery / unknown date   Patient Active Problem List   Diagnosis Date Noted  . Acute diastolic CHF (congestive heart failure) ([place]) 06/21/2020  . Rectal bleeding 05/02/2020  . Type II diabetes mellitus with renal manifestations ([place]) 05/02/2020  . Hyperkalemia 05/02/2020  . Anemia in stage 4 chronic kidney disease ([place]) 10/15/2018  . Acute kidney failure ([place]) 10/15/2018  . Fracture of scapular body 10/15/2018  . Chronic kidney disease, stage 4 (severe) ([place]) 08/31/2018  .  Disorder of mineral metabolism, unspecified 08/31/2018  . 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 Nondisplaced fracture of acromial process, unspecified shoulder, initial encounter for closed fracture 08/19/2017  . Hyperuricemia 05/22/2017  . Multiple myeloma ([place]) 05/19/2017  . Pseudophakia of both eyes 01/11/2015  . PCO (posterior capsular opacification) 01/11/2015  . Paresthesia 01/19/2014  . Wrist pain 01/04/2014  . PVD (posterior vitreous detachment), both eyes 08/10/2012  . Glaucoma suspect of both eyes 08/10/2012  . Diabetes mellitus ([place]) 03/26/2010  . Hypertension 04/03/2009  . 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 Vertigo 10/31/2008  . Hypercholesterolemia 10/31/2008      Prior to Admission medications   Medication Sig Start Date End Date Taking? Authorizing Provider  atorvastatin (LIPITOR) 40 MG tablet Take 40 mg by mouth daily at 6 PM.  07/27/18  Yes [provider]  B Complex-C-Folic Acid (RENAL) 1 MG CAPS TK 1 C PO D 07/26/18  Yes [provider]  calcium carbonate (TUMS EX) 750 MG chewable tablet Chew 750 mg by mouth daily as needed.   Yes [provider]  carvedilol (COREG) 3.125 MG tablet Take 3.125 mg by mouth 2 (two) times daily with a meal.  08/02/18  Yes [provider]  Cetirizine HCl 10 MG CAPS Take 1 capsule by mouth daily.    Yes [provider]  clopidogrel (PLAVIX) 75 MG tablet Take  75 mg by mouth daily.    Yes [provider]  ferrous sulfate 325 (65 FE) MG tablet Take 325 mg by mouth 3 (three) times a week.    Yes [provider]  Fiber Diet TABS Take 2 tablets by mouth 2 (two) times daily.    Yes [provider]  gabapentin (NEURONTIN) 300 MG capsule Take 300 mg by mouth 2 (two) times daily.  08/31/18  Yes [provider]  HUMALOG 100 UNIT/ML injection Inject into the skin as directed. Sliding Scale 05/20/18  Yes [provider]   hydrALAZINE (APRESOLINE) 25 MG tablet Take 1 tablet (25 mg total) by mouth 2 (two) times daily. 05/04/20  Yes Fritzi Mandes, MD  insulin glargine (LANTUS) 100 UNIT/ML injection Inject 0.1 mLs (10 Units total) into the skin 2 (two) times daily. Patient taking differently: Inject 35 Units into the skin 2 (two) times daily. 35 units in the morning, and 35 units at night time 05/04/20  Yes Fritzi Mandes, MD  lidocaine (LIDODERM) 5 % Place onto the skin.   Yes [provider]  lisinopril (ZESTRIL) 10 MG tablet Take 10 mg by mouth daily. 08/15/19  Yes [provider]  multivitamin (RENA-VIT) TABS tablet Take 1 tablet by mouth every morning. 05/22/20  Yes [provider]  NIFEdipine (ADALAT CC) 60 MG 24 hr tablet Take 60 mg by mouth daily.  10/20/17  Yes [provider]  NIFEdipine (PROCARDIA XL/NIFEDICAL-XL) 90 MG 24 hr tablet Take 90 mg by mouth daily. 05/22/20  Yes [provider]  sodium bicarbonate 650 MG tablet Take 1 tablet by mouth in the morning and at bedtime.   Yes [provider]  acetaminophen (TYLENOL) 325 MG tablet Take 325 mg by mouth every 4 (four) hours as needed.     [provider]  colestipol (COLESTID) 1 g tablet Take 1 g by mouth 2 (two) times daily.  10/04/18 12/22/19  [provider]  glucose blood test strip 1 each by Finger Stick route 4 times daily (after meals and nightly). Use as directed 12/24/17   [provider]  INSULIN SYRINGE 1CC/29G 29G X 1/2" 1 ML MISC     [provider]  SURE COMFORT PEN NEEDLES 31G X 8 MM [place]  08/28/18   [provider]    Allergies Succinylcholine and Penicillins    Social History Social History   Tobacco Use  . Smoking status: Never Smoker  . Smokeless tobacco: Never Used  Vaping Use  . Vaping Use: Never used  Substance Use Topics  . Alcohol use: Never  . Drug use: Never    Review of Systems Patient denies headaches, rhinorrhea, blurry  vision, numbness, shortness of breath, chest pain, edema, cough, abdominal pain, nausea, vomiting, diarrhea, dysuria, fevers, rashes or hallucinations unless otherwise stated above in HPI. ____________________________________________   PHYSICAL EXAM:  VITAL SIGNS: Vitals:   06/21/20 1200 06/21/20 1300  BP: (!) 190/96 110/87  Pulse: 94 96  Resp: (!) 21 18  Temp:    SpO2: 94% 95%    Constitutional: Alert and oriented.  Eyes: Conjunctivae are normal.  Head: Atraumatic. Nose: No congestion/rhinnorhea. Mouth/Throat: Mucous membranes are moist.   Neck: No stridor. Painless ROM.  Cardiovascular: Normal rate, regular rhythm. Grossly normal heart sounds.  Good peripheral circulation. Respiratory: Normal respiratory effort.  No retractions. Lungs with diminshed bs posteriorly, scattered crackles Gastrointestinal: Soft and nontender. No distention. No abdominal bruits. No CVA tenderness. Genitourinary:  Musculoskeletal: No lower extremity  tenderness nor edema.  No joint effusions. Neurologic:  Normal speech and language. No gross focal neurologic deficits are appreciated. No facial droop Skin:  Skin is warm, dry and intact. No rash noted. Psychiatric: Mood and affect are normal. Speech and behavior are normal.  ____________________________________________   LABS (all labs ordered are listed, but only abnormal results are displayed)  Results for orders placed or performed during the hospital encounter of 06/21/20 (from the past 24 hour(s))  Basic metabolic panel     Status: Abnormal   Collection Time: 06/21/20 10:27 AM  Result Value Ref Range   Sodium 142 135 - 145 mmol/L   Potassium 4.2 3.5 - 5.1 mmol/L   Chloride 107 98 - 111 mmol/L   CO2 24 22 - 32 mmol/L   Glucose, Bld 111 (H) 70 - 99 mg/dL   BUN 52 (H) 8 - 23 mg/dL   Creatinine, Ser 5.75 (H) 0.44 - 1.00 mg/dL   Calcium 9.4 8.9 - 10.3 mg/dL   GFR, Estimated 7 (L) >60 mL/min   Anion gap 11 5 - 15  Brain natriuretic peptide      Status: Abnormal   Collection Time: 06/21/20 10:27 AM  Result Value Ref Range   B Natriuretic Peptide 709.6 (H) 0.0 - 100.0 pg/mL  Resp Panel by RT-PCR (Flu A&B, Covid) Nasopharyngeal Swab     Status: None   Collection Time: 06/21/20 10:27 AM   Specimen: Nasopharyngeal Swab; Nasopharyngeal(NP) swabs in vial transport medium  Result Value Ref Range   SARS Coronavirus 2 by RT PCR NEGATIVE NEGATIVE   Influenza A by PCR NEGATIVE NEGATIVE   Influenza B by PCR NEGATIVE NEGATIVE  Troponin I (High Sensitivity)     Status: Abnormal   Collection Time: 06/21/20 10:40 AM  Result Value Ref Range   Troponin I (High Sensitivity) 257 (HH) <18 ng/L  D-dimer, quantitative     Status: Abnormal   Collection Time: 06/21/20 10:40 AM  Result Value Ref Range   D-Dimer, Quant 0.63 (H) 0.00 - 0.50 ug/mL-FEU  Troponin I (High Sensitivity)     Status: Abnormal   Collection Time: 06/21/20 12:27 PM  Result Value Ref Range   Troponin I (High Sensitivity) 313 (HH) <18 ng/L  CBC with Differential/Platelet     Status: Abnormal   Collection Time: 06/21/20 12:27 PM  Result Value Ref Range   WBC 10.7 (H) 4.0 - 10.5 K/uL   RBC 3.44 (L) 3.87 - 5.11 MIL/uL   Hemoglobin 9.5 (L) 12.0 - 15.0 g/dL   HCT 30.0 (L) 36.0 - 46.0 %   MCV 87.2 80.0 - 100.0 fL   MCH 27.6 26.0 - 34.0 pg   MCHC 31.7 30.0 - 36.0 g/dL   RDW 15.8 (H) 11.5 - 15.5 %   Platelets 265 150 - 400 K/uL   nRBC 0.0 0.0 - 0.2 %   Neutrophils Relative % 89 %   Neutro Abs 9.4 (H) 1.7 - 7.7 K/uL   Lymphocytes Relative 8 %   Lymphs Abs 0.9 0.7 - 4.0 K/uL   Monocytes Relative 2 %   Monocytes Absolute 0.2 0.1 - 1.0 K/uL   Eosinophils Relative 0 %   Eosinophils Absolute 0.0 0.0 - 0.5 K/uL   Basophils Relative 0 %   Basophils Absolute 0.0 0.0 - 0.1 K/uL   Immature Granulocytes 1 %   Abs Immature Granulocytes 0.10 (H) 0.00 - 0.07 K/uL   ____________________________________________  EKG My review and personal interpretation at Time: 10:21   Indication: sob  Rate: 80  Rhythm: sinus Axis: normal Other: normal intervals, no stemi ____________________________________________  RADIOLOGY  I personally reviewed all radiographic images ordered to evaluate for the above acute complaints and reviewed radiology reports and findings.  These findings were personally discussed with the patient.  Please see medical record for radiology report.  ____________________________________________   PROCEDURES  Procedure(s) performed:  .Critical Care Performed by: Merlyn Lot, MD Authorized by: Merlyn Lot, MD   Critical care provider statement:    Critical care time (minutes):  35   Critical care time was exclusive of:  Separately billable procedures and treating other patients   Critical care was necessary to treat or prevent imminent or life-threatening deterioration of the following conditions:  Circulatory failure   Critical care was time spent personally by me on the following activities:  Development of treatment plan with patient or surrogate, discussions with consultants, evaluation of patient's response to treatment, examination of patient, obtaining history from patient or surrogate, ordering and performing treatments and interventions, ordering and review of laboratory studies, ordering and review of radiographic studies, pulse oximetry, re-evaluation of patient's condition and review of old charts      Critical Care performed: yes ____________________________________________   INITIAL IMPRESSION / [place] / ED COURSE  Pertinent labs & imaging results that were available during my care of the patient were reviewed by me and considered in my medical decision making (see chart for details).   DDX: Asthma, copd, CHF, pna, ptx, malignancy, Pe, anemia   Brynlea Spindler is a 80 y.o. who presents to the ED with presentation as described above.  Patient found to be hypoxic requiring supplemental oxygen we will wean down to  nasal cannula.  I do not appreciate any wheezing on exam.  Does not appear volume overloaded but does have reported history of CKD not on dialysis she is noted to be mildly hypertensive did not take her meds this morning.  The patient will be placed on continuous pulse oximetry and telemetry for monitoring.  Laboratory evaluation will be sent to evaluate for the above complaints.     Clinical Course as of 06/21/20 1347  Thu Jun 21, 2020  1307 Patient with rising troponin.  Have ordered heparin.  Will give aspirin.  Hypoxia resolved after supplemental oxygen. [PR]  1311 D-dimer also mildly elevated may be simply secondary to her chronic kidney disease but given her hypoxia will order VQ scan.  Unable to CTA due to his CKD.  She not complaining of any chest pain or pressure.  Repeat blood pressure improved.  Will continue observe.  Will discuss with hospitalist for admission. [PR]    Clinical Course User Index [PR] Merlyn Lot, MD    The patient was evaluated in Emergency Department today for the symptoms described in the history of present illness. He/she was evaluated in the context of the global COVID-19 pandemic, which necessitated consideration that the patient might be at risk for infection with the SARS-CoV-2 virus that causes COVID-19. Institutional protocols and algorithms that pertain to the evaluation of patients at risk for COVID-19 are in a state of rapid change based on information released by regulatory bodies including the CDC and federal and state organizations. These policies and algorithms were followed during the patient's care in the ED.  As part of my medical decision making, I reviewed the following data within the [place] notes reviewed and incorporated, Labs reviewed, notes from prior ED visits and [place] Controlled Substance Database  ____________________________________________   FINAL CLINICAL IMPRESSION(S) / ED DIAGNOSES  Final  diagnoses:  Dyspnea, unspecified type  Acute respiratory failure with hypoxia ([place])      NEW MEDICATIONS STARTED DURING THIS VISIT:  New Prescriptions   No medications on file     Note:  This document was prepared using Dragon voice recognition software and may include unintentional dictation errors.    Merlyn Lot, MD 06/21/20 1312    Merlyn Lot, MD 06/21/20 [phone]

## 2020-06-21 NOTE — Progress Notes (Signed)
ANTICOAGULATION CONSULT NOTE - Initial Consult  Pharmacy Consult for Heparin Drip Indication: chest pain/ACS  Allergies  Allergen Reactions  . Succinylcholine Other (See Comments)    Severe myalgia's  . Penicillins Rash    Patient Measurements: Height: 5\' 3"  (160 cm) Weight: 61.7 kg (136 lb) IBW/kg (Calculated) : 52.4 Heparin Dosing Weight: 61.7 kg   Vital Signs: Temp: 98.6 F (37 C) (04/14 1100) Temp Source: Oral (04/14 1100) BP: 124/83 (04/14 1400) Pulse Rate: 102 (04/14 1400)  Labs: Recent Labs    06/19/20 1002 06/21/20 1027 06/21/20 1040 06/21/20 1227  HGB 9.1*  --   --  9.5*  HCT 28.7*  --   --  30.0*  PLT  --   --   --  265  CREATININE  --  5.75*  --   --   TROPONINIHS  --   --  257* 313*    Estimated Creatinine Clearance: 6.8 mL/min (A) (by C-G formula based on SCr of 5.75 mg/dL (H)).   Medical History: Past Medical History:  Diagnosis Date  . Anemia   . Cancer [facility]) 2011   rectal ca/chemo /rad Lenore Manner  . Chronic kidney disease   . Diabetes mellitus without complication ([place])    type 2  . Hypertension   . Personal history of chemotherapy   . Personal history of radiation therapy   . TIA (transient ischemic attack)     Assessment: Patient is a 80yo female admitted for shortness of breath and worsening orthopnea. Troponin is trending up 257 > 313. Pharmacy consulted for Heparin dosing. No prior anticoagulants noted.   Goal of Therapy:  Heparin level 0.3-0.7 units/ml Monitor platelets by anticoagulation protocol: Yes   Plan:  Give 3700 units bolus x 1 Start heparin infusion at 750 units/hr Check anti-Xa level in 8 hours and daily while on heparin Continue to monitor H&H and platelets  Paulina Fusi, PharmD, BCPS 06/21/2020 2:10 PM

## 2020-06-21 NOTE — ED Notes (Signed)
Mansy MD paged to make aware of pt's continued hypertension and tachycardia after PO meds.

## 2020-06-21 NOTE — H&P (Signed)
History and Physical    Misty [name] JTT:[id number] DOB: 03/21/1940 DOA: 06/21/2020  PCP: Gayland Curry, MD   Patient coming from: Home  I have personally briefly reviewed patient's old medical records in [place]  Chief Complaint: Shortness of breath  HPI: Misty [name] is a 80 y.o. 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  Patient states that she developed shortness of breath at rest last night as well as orthopnea.  Lying down made her shortness of breath worse and her symptoms continue to worsen and so she called her daughter who is a nurse who came and evaluated her and noted her blood pressure to be elevated but did not have a pulse oximer with her. EMS was called and patient was hypoxic with room air pulse oximetry of 85%, she was placed on a nonrebreather mask/10L of oxygen with improvement in her pulse oximetry to 99% and transported to the emergency room.  She received Solu-Medrol 25 mg IV in the field. Patient has been weaned off nonrebreather mask and is currently on 4 L of oxygen via nasal cannula. Shortness of breath was associated with diaphoresis, nausea and vomiting but she denied having any chest pain. She denies having any fever, no chills, no changes in her bowel habits, no lower extremity swelling, no dizziness, no lightheadedness, no palpitations, no cough, no headache, no blurred vision, no urinary symptoms, no focal deficits. Patient has stage V chronic kidney disease and has been referred to a vascular surgeon for AV graft placement in preparation for dialysis.  Labs show sodium 142, potassium 4.2, chloride 107,  bicarb 24, glucose 111, BUN 52, creatinine 5.75, calcium 9.4, BNP 709, troponin 257 >> 313, white count 10.7, hemoglobin 9.5, hematocrit 30.0, MCV 87.3, RDW 15.8, platelet 265 D-dimer 0.63 Respiratory viral panel negative Chest x-ray reviewed by me shows small bilateral pleural effusions with bibasilar atelectasis or infiltrates Twelve-lead EKG reviewed by me shows sinus rhythm, nonspecific T wave changes in the lateral leads, low voltage QRS    ED Course: Patient is a 80 year old female who presents to the emergency room via EMS for evaluation of shortness of breath that started acutely the evening prior to admission associated with orthopnea, diaphoresis, nausea and vomiting.  In the field she was hypoxic with room air pulse oximetry of 85% and is currently on oxygen at 4 L via nasal cannula to maintain pulse ox greater than 92%. Chest x-ray shows bilateral pleural effusions and BNP is elevated.  She also has slightly elevated D-dimer (may be normal for her age).  She received a dose of Lasix in the ER.  VQ scan ordered by ER provider is still pending.  Troponin shows an upward trend and patient has been started on heparin drip.  She will be admitted to the hospital for further evaluation.   Review of Systems: As per HPI otherwise all other systems reviewed and negative.    Past Medical History:  Diagnosis Date  . Anemia   . Cancer [facility]) 2011   rectal ca/chemo /rad Lenore Manner  . Chronic kidney disease   . Diabetes mellitus without complication ([place])    type 2  . Hypertension   . Personal history of chemotherapy   .  Personal history of radiation therapy   . TIA (transient ischemic attack)     Past Surgical History:  Procedure Laterality Date  . BREAST BIOPSY Left    bx/ clip-neg  . COLONOSCOPY N/A 05/03/2020   Procedure: COLONOSCOPY;  Surgeon: Lesly Rubenstein, MD;  Location: [facility] ENDOSCOPY;  Service: Endoscopy;  Laterality: N/A;  . coloretcal cancer    . thyroid gland      surgery / unknown date     reports that she has never smoked. She has never used smokeless tobacco. She reports that she does not drink alcohol and does not use drugs.  Allergies  Allergen Reactions  . Succinylcholine Other (See Comments)    Severe myalgia's  . Penicillins Rash    Family History  Problem Relation Age of Onset  . Lung disease Sister   . Diabetes Mellitus II Paternal Grandmother   . Breast cancer Neg Hx       Prior to Admission medications   Medication Sig Start Date End Date Taking? Authorizing Provider  atorvastatin (LIPITOR) 40 MG tablet Take 40 mg by mouth daily at 6 PM.  07/27/18  Yes [provider]  B Complex-C-Folic Acid (RENAL) 1 MG CAPS TK 1 C PO D 07/26/18  Yes [provider]  calcium carbonate (TUMS EX) 750 MG chewable tablet Chew 750 mg by mouth daily as needed.   Yes [provider]  carvedilol (COREG) 3.125 MG tablet Take 3.125 mg by mouth 2 (two) times daily with a meal.  08/02/18  Yes [provider]  Cetirizine HCl 10 MG CAPS Take 1 capsule by mouth daily.    Yes [provider]  clopidogrel (PLAVIX) 75 MG tablet Take 75 mg by mouth daily.    Yes [provider]  ferrous sulfate 325 (65 FE) MG tablet Take 325 mg by mouth 3 (three) times a week.    Yes [provider]  Fiber Diet TABS Take 2 tablets by mouth 2 (two) times daily.    Yes [provider]  gabapentin (NEURONTIN) 300 MG capsule Take 300 mg by mouth 2 (two) times daily.  08/31/18  Yes [provider]  HUMALOG 100 UNIT/ML injection Inject into the skin as directed. Sliding Scale 05/20/18  Yes [provider]  hydrALAZINE (APRESOLINE) 25 MG tablet Take 1 tablet (25 mg total) by mouth 2 (two) times daily. 05/04/20  Yes Fritzi Mandes, MD  insulin glargine (LANTUS) 100 UNIT/ML injection Inject 0.1 mLs (10 Units total) into the skin 2 (two) times daily. Patient taking differently: Inject 35 Units into the skin 2  (two) times daily. 35 units in the morning, and 35 units at night time 05/04/20  Yes Fritzi Mandes, MD  lidocaine (LIDODERM) 5 % Place onto the skin.   Yes [provider]  lisinopril (ZESTRIL) 10 MG tablet Take 10 mg by mouth daily. 08/15/19  Yes [provider]  multivitamin (RENA-VIT) TABS tablet Take 1 tablet by mouth every morning. 05/22/20  Yes [provider]  NIFEdipine (ADALAT CC) 60 MG 24 hr tablet Take 60 mg by mouth daily.  10/20/17  Yes [provider]  NIFEdipine (PROCARDIA XL/NIFEDICAL-XL) 90 MG 24 hr tablet Take 90 mg by mouth daily. 05/22/20  Yes [provider]  sodium bicarbonate 650 MG tablet Take 1 tablet by mouth in the morning and at bedtime.   Yes [provider]  acetaminophen (TYLENOL) 325 MG tablet Take 325 mg by mouth every 4 (four) hours as needed.  [provider]  colestipol (COLESTID) 1 g tablet Take 1 g by mouth 2 (two) times daily.  10/04/18 12/22/19  [provider]  glucose blood test strip 1 each by Finger Stick route 4 times daily (after meals and nightly). Use as directed 12/24/17   [provider]  INSULIN SYRINGE 1CC/29G 29G X 1/2" 1 ML MISC     [provider]  SURE COMFORT PEN NEEDLES 31G X 8 MM [place]  08/28/18   [provider]    Physical Exam: Vitals:   06/21/20 1100 06/21/20 1200 06/21/20 1300 06/21/20 1400  BP: (!) 181/63 (!) 190/96 110/87 124/83  Pulse: 89 94 96 (!) 102  Resp: (!) 22 (!) 21 18 (!) 21  Temp: 98.6 F (37 C)     TempSrc: Oral     SpO2: 94% 94% 95% 97%  Weight:      Height:         Vitals:   06/21/20 1100 06/21/20 1200 06/21/20 1300 06/21/20 1400  BP: (!) 181/63 (!) 190/96 110/87 124/83  Pulse: 89 94 96 (!) 102  Resp: (!) 22 (!) 21 18 (!) 21  Temp: 98.6 F (37 C)     TempSrc: Oral     SpO2: 94% 94% 95% 97%  Weight:      Height:          Constitutional: Alert and oriented x 3.  Patient has conversational dyspnea HEENT:       Head: Normocephalic and atraumatic.         Eyes: PERLA, EOMI, Conjunctivae pallor. Sclera is non-icteric.       Mouth/Throat: Mucous membranes are moist.       Neck: Supple with no signs of meningismus. Cardiovascular: Regular rate and rhythm. No murmurs, gallops, or rubs. 2+ symmetrical distal pulses are present . No JVD. No  LE edema Respiratory: Respiratory effort normal. Crackles at both bases bilaterally. No wheezes or rhonchi.  Gastrointestinal: Soft, non tender, and non distended with positive bowel sounds.  Genitourinary: No CVA tenderness. Musculoskeletal: Nontender with normal range of motion in all extremities. No cyanosis, or erythema of extremities. Neurologic:  Face is symmetric. Moving all extremities. No gross focal neurologic deficits  Skin: Skin is warm, dry.  No rash or ulcers Psychiatric: Mood and affect are normal   Labs on Admission: I have personally reviewed following labs and imaging studies  CBC: Recent Labs  Lab 06/19/20 1002 06/21/20 1227  WBC  --  10.7*  NEUTROABS  --  9.4*  HGB 9.1* 9.5*  HCT 28.7* 30.0*  MCV  --  87.2  PLT  --  267   Basic Metabolic Panel: Recent Labs  Lab 06/21/20 1027  NA 142  K 4.2  CL 107  CO2 24  GLUCOSE 111*  BUN 52*  CREATININE 5.75*  CALCIUM 9.4   GFR: Estimated Creatinine Clearance: 6.8 mL/min (A) (by C-G formula based on SCr of 5.75 mg/dL (H)). Liver Function Tests: No results for input(s): AST, ALT, ALKPHOS, BILITOT, PROT, ALBUMIN in the last 168 hours. No results for input(s): LIPASE, AMYLASE in the last 168 hours. No results for input(s): AMMONIA in the last 168 hours. Coagulation Profile: No results for input(s): INR, PROTIME in the last 168 hours. Cardiac Enzymes: No results for input(s): CKTOTAL, CKMB, CKMBINDEX, TROPONINI in the last 168 hours. BNP (last 3 results) No results for input(s): PROBNP in the last 8760 hours. HbA1C: No results for input(s): HGBA1C in the last 72 hours. CBG:  No results  for input(s): GLUCAP in the last 168 hours. Lipid Profile: No results for input(s): CHOL, HDL, LDLCALC, TRIG, CHOLHDL, LDLDIRECT in the last 72 hours. Thyroid Function Tests: No results for input(s): TSH, T4TOTAL, FREET4, T3FREE, THYROIDAB in the last 72 hours. Anemia Panel: No results for input(s): VITAMINB12, FOLATE, FERRITIN, TIBC, IRON, RETICCTPCT in the last 72 hours. Urine analysis: No results found for: COLORURINE, APPEARANCEUR, LABSPEC, [place], GLUCOSEU, HGBUR, BILIRUBINUR, KETONESUR, PROTEINUR, UROBILINOGEN, NITRITE, LEUKOCYTESUR  Radiological Exams on Admission: DG Chest 2 View  Result Date: 06/21/2020 CLINICAL DATA:  Shortness of breath EXAM: CHEST - 2 VIEW COMPARISON:  None. FINDINGS: Heart is normal size. Aortic atherosclerosis. Bibasilar airspace opacities. Small bilateral pleural effusions. No acute bony abnormality. IMPRESSION: Small bilateral pleural effusions with bibasilar atelectasis or infiltrates. Electronically Signed   By: Rolm Baptise M.D.   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 2D echocardiogram from August 2021 showed an LVEF of 55% We will start patient on Lasix 80 mg IV every 12 Continue carvedilol and lisinopril    Elevated troponin Rule out acute coronary syndrome versus secondary to demand ischemia from acute CHF Patient is currently on a heparin drip Continue aspirin, Plavix, beta-blockers and statins Obtain 2D echocardiogram to  assess LVEF and rule out regional wall motion abnormality Will request cardiology consult     Acute respiratory failure Most likely secondary to acute diastolic dysfunction CHF In the field patient had room air pulse oximetry of 85% and was initially on a nonrebreather mask at 10 L of oxygen, she has been weaned down to 4 L and is maintaining a pulse oximetry greater than 92% We will attempt to wean patient off oxygen once her respiratory status has stabilized She will need to be assessed for home oxygen need prior to discharge A VQ scan has been ordered to rule out PE in this patient since she had an elevated D-dimer as well as hypoxia.    Diabetes mellitus with complications of stage V chronic kidney disease Patient has stage V chronic kidney disease and has been referred to vascular surgery for vein mapping in anticipation for impending dialysis. She presents now in acute diastolic dysfunction CHF and has reduced urine output according to patient Will diurese patient Consult nephrology for further recommendation Continue bicarbonate supplementation Place patient on consistent carbohydrate diet Glycemic control with sliding scale insulin    Hypertension Patient is normotensive Hold hydralazine and nifedipine for now  DVT prophylaxis: Heparin Code Status: full code Family Communication: Greater than 50% of time was spent discussing patient's condition and plan of care with her and her husband at the bedside.  All questions and concerns have been addressed.  They verbalized understanding and agree with the plan.  CODE STATUS was discussed and she is a full code. Disposition Plan: Back to previous home environment Consults called: Cardiology/nephrology Status: At the time of admission, it appears that the appropriate admission status for this patient is inpatient.   This is judged to be  reasonable to ensure the patient's safety given the presenting symptoms, exam findings and   initial radiographic and laboratory data in the context of their  comorbid conditions Patient requires inpatient status due to high intensity of service, high risk for further deterioration and high frequency of surveillance required.    Collier Bullock MD [facility]     06/21/2020, 2:10 PM

## 2020-06-21 NOTE — Progress Notes (Signed)
[facility] for Heparin Drip Indication: chest pain/ACS  Allergies  Allergen Reactions  . Succinylcholine Other (See Comments)    Severe myalgia's  . Penicillins Rash    Patient Measurements: Height: 5\' 3"  (160 cm) Weight: 61.7 kg (136 lb) IBW/kg (Calculated) : 52.4 Heparin Dosing Weight: 61.7 kg   Vital Signs: BP: 170/74 (04/14 2250) Pulse Rate: 91 (04/14 2215)  Labs: Recent Labs    06/19/20 1002 06/21/20 1027 06/21/20 1040 06/21/20 1040 06/21/20 1227 06/21/20 1534 06/21/20 1837 06/21/20 2256  HGB 9.1*  --   --   --  9.5*  --   --   --   HCT 28.7*  --   --   --  30.0*  --   --   --   PLT  --   --   --   --  265  --   --   --   APTT  --   --  27  --   --   --   --   --   LABPROT  --   --  12.9  --   --   --   --   --   INR  --   --  1.0  --   --   --   --   --   HEPARINUNFRC  --   --   --   --   --   --   --  0.18*  CREATININE  --  5.75*  --   --   --   --   --   --   TROPONINIHS  --   --  257*   < > 313* 362* 310*  --    < > = values in this interval not displayed.    Estimated Creatinine Clearance: 6.8 mL/min (A) (by C-G formula based on SCr of 5.75 mg/dL (H)).   Medical History: Past Medical History:  Diagnosis Date  . Anemia   . Cancer [facility]) 2011   rectal ca/chemo /rad Lenore Manner  . Chronic kidney disease   . Diabetes mellitus without complication ([place])    type 2  . Hypertension   . Personal history of chemotherapy   . Personal history of radiation therapy   . TIA (transient ischemic attack)     Assessment: Patient is a 80yo female admitted for shortness of breath and worsening orthopnea. Troponin is trending up 257 > 313. Pharmacy consulted for Heparin dosing. No prior anticoagulants noted.   4/14 2256 HL 0.18   Goal of Therapy:  Heparin level 0.3-0.7 units/ml Monitor platelets by anticoagulation protocol: Yes   Plan:  Heparin level is subtherapeutic Will give 2000 unit bolus followed by an increase in  heparin infusion to 900 units/hr. Recheck heparin level in 8 hours. CBC daily while on heparin.   Eleonore Chiquito, PharmD, BCPS 06/21/2020 11:49 PM

## 2020-06-21 NOTE — ED Triage Notes (Addendum)
To ED from home via [facility] for SOB that started last night and became worse this morning Pt found to be 85% on RA, placed on 4L oxygen by EMS, still 90%, ;put on 10L NRB and 99%. EMS VS: HR 81, BP 166/61, CBG 189, T 98.6 oral. Hx DM, TIA 15 years ago, CKD, HTN. Pt has 20g IV in R AC, received 125mg  solumedrol. Pt in NAD, sitting up in bed, on 10L NRB.  Denies CP. No respiratory or cardiac history.

## 2020-06-21 NOTE — ED Notes (Signed)
EDP at bedside with pt. This nurse had turned down to 8L NRB, pt maintained at 99%.   EDP placed pt on 4L [place], removed NRB, pt still satting 97-98%.

## 2020-06-21 NOTE — ED Notes (Signed)
Pt back from [facility]. Meal tray provided.

## 2020-06-22 ENCOUNTER — Encounter: Payer: Self-pay | Admitting: Internal Medicine

## 2020-06-22 ENCOUNTER — Inpatient Hospital Stay (HOSPITAL_COMMUNITY)
Admit: 2020-06-22 | Discharge: 2020-06-22 | Disposition: A | Discharge status: Home / Self Care | Payer: Medicare Other | Attending: Internal Medicine | Admitting: Internal Medicine

## 2020-06-22 DIAGNOSIS — I5031 Acute diastolic (congestive) heart failure: Secondary | ICD-10-CM | POA: Diagnosis not present

## 2020-06-22 DIAGNOSIS — R0602 Shortness of breath: Secondary | ICD-10-CM

## 2020-06-22 LAB — CBC
HCT: 27.8 % — ABNORMAL LOW (ref 36.0–46.0)
Hemoglobin: 8.9 g/dL — ABNORMAL LOW (ref 12.0–15.0)
MCH: 28 pg (ref 26.0–34.0)
MCHC: 32 g/dL (ref 30.0–36.0)
MCV: 87.4 fL (ref 80.0–100.0)
Platelets: 282 10*3/uL (ref 150–400)
RBC: 3.18 MIL/uL — ABNORMAL LOW (ref 3.87–5.11)
RDW: 15.9 % — ABNORMAL HIGH (ref 11.5–15.5)
WBC: 13.4 10*3/uL — ABNORMAL HIGH (ref 4.0–10.5)
nRBC: 0.2 % (ref 0.0–0.2)

## 2020-06-22 LAB — GLUCOSE, CAPILLARY
Glucose-Capillary: 177 mg/dL — ABNORMAL HIGH (ref 70–99)
Glucose-Capillary: 225 mg/dL — ABNORMAL HIGH (ref 70–99)

## 2020-06-22 LAB — BASIC METABOLIC PANEL
Anion gap: 12 (ref 5–15)
BUN: 59 mg/dL — ABNORMAL HIGH (ref 8–23)
CO2: 22 mmol/L (ref 22–32)
Calcium: 9 mg/dL (ref 8.9–10.3)
Chloride: 106 mmol/L (ref 98–111)
Creatinine, Ser: 5.99 mg/dL — ABNORMAL HIGH (ref 0.44–1.00)
GFR, Estimated: 7 mL/min — ABNORMAL LOW (ref >60)
Glucose, Bld: 170 mg/dL — ABNORMAL HIGH (ref 70–99)
Potassium: 3.7 mmol/L (ref 3.5–5.1)
Sodium: 140 mmol/L (ref 135–145)

## 2020-06-22 LAB — HEPARIN LEVEL (UNFRACTIONATED): Heparin Unfractionated: 0.41 IU/mL (ref 0.30–0.70)

## 2020-06-22 MED ORDER — HYDRALAZINE HCL 25 MG PO TABS
25.0000 mg | ORAL_TABLET | Freq: Once | ORAL | Status: AC
  Administered 2020-06-22: 25 mg via ORAL
  Filled 2020-06-22: qty 1

## 2020-06-22 MED ORDER — INSULIN ASPART 100 UNIT/ML ~~LOC~~ SOLN
0.0000 [IU] | Freq: Every day | SUBCUTANEOUS | Status: DC
  Administered 2020-06-22: 2 [IU] via SUBCUTANEOUS
  Filled 2020-06-22: qty 1

## 2020-06-22 MED ORDER — CARVEDILOL 6.25 MG PO TABS
6.2500 mg | ORAL_TABLET | Freq: Two times a day (BID) | ORAL | Status: DC
  Administered 2020-06-22 – 2020-06-23 (×2): 6.25 mg via ORAL
  Filled 2020-06-22 (×2): qty 1

## 2020-06-22 MED ORDER — INSULIN ASPART 100 UNIT/ML ~~LOC~~ SOLN
0.0000 [IU] | Freq: Three times a day (TID) | SUBCUTANEOUS | Status: DC
  Administered 2020-06-22 – 2020-06-23 (×2): 2 [IU] via SUBCUTANEOUS
  Administered 2020-06-23: 1 [IU] via SUBCUTANEOUS
  Filled 2020-06-22 (×3): qty 1

## 2020-06-22 MED ORDER — NIFEDIPINE ER OSMOTIC RELEASE 90 MG PO TB24
90.0000 mg | ORAL_TABLET | Freq: Every day | ORAL | Status: DC
  Administered 2020-06-23: 90 mg via ORAL
  Filled 2020-06-22: qty 1

## 2020-06-22 MED ORDER — HYDRALAZINE HCL 50 MG PO TABS
50.0000 mg | ORAL_TABLET | Freq: Two times a day (BID) | ORAL | Status: DC
  Administered 2020-06-22: 50 mg via ORAL
  Filled 2020-06-22: qty 1

## 2020-06-22 NOTE — Progress Notes (Signed)
PROGRESS NOTE    Misty [name]  TSV:[id number] DOB: 03/04/1941 DOA: 06/21/2020 PCP: Gayland Curry, MD   Brief Narrative: Taken from H&P. Misty [name] is a 80 y.o. 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  Patient states that she developed shortness of breath at rest last night as well as orthopnea. He was found to be hypoxic, initially requiring nonrebreather and then later transitioned to 4 L of oxygen via nasal cannula.  Labs show sodium 142, potassium 4.2, chloride 107, bicarb 24, glucose 111, BUN 52, creatinine 5.75, calcium 9.4, BNP 709, troponin 257 >> 313, white count 10.7, hemoglobin 9.5, hematocrit 30.0, MCV 87.3, RDW 15.8, platelet 265 D-dimer 0.63 Respiratory viral panel negative Chest x-ray  shows small bilateral pleural effusions with bibasilar atelectasis or infiltrates. VQ scan was done due to elevated D-dimer, although within normal limits when adjusted for age, and was low probability for PE. She received 80 mg of IV Lasix in ER with improvement in her symptoms started on IV diuresis, which was discontinued today due to worsening renal function.  Patient has stage V chronic kidney disease and has been referred to a vascular surgeon for AV graft placement in preparation for dialysis.  Nephrology was consulted.  Subjective: Patient was feeling much improved when seen today.  Denies any more orthopnea and was able to lay flat.  Could not get good night sleep after getting IV Lasix due to increased diuresis. Apparently wetting the bed whole night therefore not good output recorded. Husband at bedside.  Per patient nephrology  saw her this morning and they were recommending putting a catheter and starting her on dialysis-waiting for their consult note.  Assessment & Plan:   Principal Problem:   Acute diastolic CHF (congestive heart failure) (HCC) Active Problems:   Multiple myeloma (HCC)   MGUS (monoclonal gammopathy of unknown significance)   Type II diabetes mellitus with renal manifestations (HCC)   CKD stage 5 due to type 2 diabetes mellitus ([place])   Acute respiratory failure (HCC)  Acute hypoxic respiratory failure secondary to acute on chronic HFpEF. Patient responded well to IV diuresis and respiratory status appears back to her baseline.  She was saturating close to 100% on 4 L of oxygen which was decreased to 2 by me with no change in saturation.  VQ scan with low probability of PE. Normal EF and stable heart rate aortic stenosis on echo done last year in August. -Cardiology was consulted and they does not think she need any further cardiac work-up. -Try weaning her off from oxygen -Discontinue IV Lasix due to worsening creatinine-might get benefit with Lasix infusion but I will defer that decision to nephrology. -Continue to monitor daily BMP and weight. -Strict intake and output -Continue with carvedilol-dose was increased today. -Discontinue lisinopril due to worsening creatinine. -Follow-up repeat echocardiogram  Elevated troponin.  No chest pain and troponin peaked at 362 and started trending down.  Most likely secondary to demand ischemia with acute on chronic heart failure.  She was started on heparin infusion in ED. -Discontinue heparin infusion. -No need for further cardiac work-up per cardiology. -Continue with home dose of aspirin, Plavix, beta-blocker and statin  AKI with CKD stage V.  Baseline creatinine  around 4.1-4.3.  Bump to 5.99 this morning after getting high doses of IV Lasix.Patient has stage V chronic kidney disease and has been referred to a vascular surgeon for AV graft  placement in preparation for dialysis.  -Hold IV Lasix. -Nephrology consult-might need to be started on dialysis. -Continue with bicarbonate supplement  Diabetes mellitus with renal complications.  Last A1c done in December 2021 was 6. She was on Lantus 10 units twice daily along with sliding scale at home. -Recheck A1c -Sensitive/renal SSI-we will add Lantus if needed.  Hypertension.  Blood pressure elevated. Patient was on lisinopril, nifedipine, carvedilol and hydralazine at home. Holding lisinopril due to AKI. -Increase the dose of hydralazine to 50 mg twice daily from 25 mg twice daily home dose. -Continue with nifedipine. -Increase the dose of carvedilol to 6.25 mg twice daily. -Continue to monitor   Objective: Vitals:   06/21/20 2250 06/22/20 0411 06/22/20 0802 06/22/20 1025  BP: (!) 170/74 (!) 167/73 (!) 177/63   Pulse: 90 90 95   Resp: $Remo'20 16 18   'DpSXb$ Temp: 98.1 F (36.7 C) 98 F (36.7 C) 98 F (36.7 C)   TempSrc: Oral Oral Oral   SpO2: 96% 97% 96%   Weight:  63.7 kg  62.6 kg  Height:        Intake/Output Summary (Last 24 hours) at 06/22/2020 1411 Last data filed at 06/22/2020 1330 Gross per 24 hour  Intake 360 ml  Output 1300 ml  Net -940 ml   Filed Weights   06/21/20 1018 06/22/20 0411 06/22/20 1025  Weight: 61.7 kg 63.7 kg 62.6 kg    Examination:  General exam: Appears calm and comfortable  Respiratory system: Clear to auscultation. Respiratory effort normal. Cardiovascular system: S1 & S2 heard, RRR. No JVD, murmurs, rubs, gallops or clicks. Gastrointestinal system: Soft, nontender, nondistended, bowel sounds positive. Central nervous system: Alert and oriented. No focal neurological deficits.Symmetric 5 x 5 power. Extremities: No edema, no cyanosis, pulses intact and symmetrical. Psychiatry: Judgement and insight appear normal. Mood & affect appropriate.    DVT prophylaxis: Heparin Code Status: Full Family Communication: Husband was updated at  bedside. Disposition Plan:  Status is: Inpatient  Remains inpatient appropriate because:Inpatient level of care appropriate due to severity of illness   Dispo: The patient is from: Home              Anticipated d/c is to: Home              Patient currently is not medically stable to d/c.   Difficult to place patient No               Level of care: Progressive Cardiac  All the records are reviewed and case discussed with Care Management/Social Worker. Management plans discussed with the patient, nursing and they are in agreement.  Consultants:   Cardiology  Nephrology  Procedures:  Antimicrobials:   Data Reviewed: I have personally reviewed following labs and imaging studies  CBC: Recent Labs  Lab 06/19/20 1002 06/21/20 1227 06/22/20 0752  WBC  --  10.7* 13.4*  NEUTROABS  --  9.4*  --   HGB 9.1* 9.5* 8.9*  HCT 28.7* 30.0* 27.8*  MCV  --  87.2 87.4  PLT  --  265 427   Basic Metabolic Panel: Recent Labs  Lab 06/21/20 1027 06/22/20 0555  NA 142 140  K 4.2 3.7  CL 107 106  CO2 24 22  GLUCOSE 111* 170*  BUN 52* 59*  CREATININE  5.75* 5.99*  CALCIUM 9.4 9.0   GFR: Estimated Creatinine Clearance: 6.5 mL/min (A) (by C-G formula based on SCr of 5.99 mg/dL (H)). Liver Function Tests: No results for input(s): AST, ALT, ALKPHOS, BILITOT, PROT, ALBUMIN in the last 168 hours. No results for input(s): LIPASE, AMYLASE in the last 168 hours. No results for input(s): AMMONIA in the last 168 hours. Coagulation Profile: Recent Labs  Lab 06/21/20 1040  INR 1.0   Cardiac Enzymes: No results for input(s): CKTOTAL, CKMB, CKMBINDEX, TROPONINI in the last 168 hours. BNP (last 3 results) No results for input(s): PROBNP in the last 8760 hours. HbA1C: No results for input(s): HGBA1C in the last 72 hours. CBG: No results for input(s): GLUCAP in the last 168 hours. Lipid Profile: No results for input(s): CHOL, HDL, LDLCALC, TRIG, CHOLHDL, LDLDIRECT in the last 72  hours. Thyroid Function Tests: No results for input(s): TSH, T4TOTAL, FREET4, T3FREE, THYROIDAB in the last 72 hours. Anemia Panel: No results for input(s): VITAMINB12, FOLATE, FERRITIN, TIBC, IRON, RETICCTPCT in the last 72 hours. Sepsis Labs: No results for input(s): PROCALCITON, LATICACIDVEN in the last 168 hours.  Recent Results (from the past 240 hour(s))  Resp Panel by RT-PCR (Flu A&B, Covid) Nasopharyngeal Swab     Status: None   Collection Time: 06/21/20 10:27 AM   Specimen: Nasopharyngeal Swab; Nasopharyngeal(NP) swabs in vial transport medium  Result Value Ref Range Status   SARS Coronavirus 2 by RT PCR NEGATIVE NEGATIVE Final    Comment: (NOTE) SARS-CoV-2 target nucleic acids are NOT DETECTED.  The SARS-CoV-2 RNA is generally detectable in upper respiratory specimens during the acute phase of infection. The lowest concentration of SARS-CoV-2 viral copies this assay can detect is 138 copies/mL. A negative result does not preclude SARS-Cov-2 infection and should not be used as the sole basis for treatment or other patient management decisions. A negative result may occur with  improper specimen collection/handling, submission of specimen other than nasopharyngeal swab, presence of viral mutation(s) within the areas targeted by this assay, and inadequate number of viral copies(<138 copies/mL). A negative result must be combined with clinical observations, patient history, and epidemiological information. The expected result is Negative.  Fact Sheet for Patients:  EntrepreneurPulse.com.au  Fact Sheet for Healthcare Providers:  IncredibleEmployment.be  This test is no t yet approved or cleared by the [place] FDA and  has been authorized for detection and/or diagnosis of SARS-CoV-2 by FDA under an Emergency Use Authorization (EUA). This EUA will remain  in effect (meaning this test can be used) for the duration of the COVID-19  declaration under Section 564(b)(1) of the Act, 21 U.S.C.section 360bbb-3(b)(1), unless the authorization is terminated  or revoked sooner.       Influenza A by PCR NEGATIVE NEGATIVE Final   Influenza B by PCR NEGATIVE NEGATIVE Final    Comment: (NOTE) The Xpert Xpress SARS-CoV-2/FLU/RSV plus assay is intended as an aid in the diagnosis of influenza from Nasopharyngeal swab specimens and should not be used as a sole basis for treatment. Nasal washings and aspirates are unacceptable for Xpert Xpress SARS-CoV-2/FLU/RSV testing.  Fact Sheet for Patients: EntrepreneurPulse.com.au  Fact Sheet for Healthcare Providers: IncredibleEmployment.be  This test is not yet approved or cleared by the [place] FDA and has been authorized for detection and/or diagnosis of SARS-CoV-2 by FDA under an Emergency Use Authorization (EUA). This EUA will remain in effect (meaning this test can be used) for the duration of the COVID-19 declaration under Section 564(b)(1) of the Act,  21 U.S.C. section 360bbb-3(b)(1), unless the authorization is terminated or revoked.  Performed at [facility], [address] [place], [place] [address]      Radiology Studies: DG Chest 2 View  Result Date: 06/21/2020 CLINICAL DATA:  Shortness of breath EXAM: CHEST - 2 VIEW COMPARISON:  None. FINDINGS: Heart is normal size. Aortic atherosclerosis. Bibasilar airspace opacities. Small bilateral pleural effusions. No acute bony abnormality. IMPRESSION: Small bilateral pleural effusions with bibasilar atelectasis or infiltrates. Electronically Signed   By: Rolm Baptise M.D.   On: 06/21/2020 11:21   NM Pulmonary Perfusion  Result Date: 06/21/2020 CLINICAL DATA:  PE suspected, shortness of breath EXAM: NUCLEAR MEDICINE PERFUSION LUNG SCAN TECHNIQUE: Perfusion images were obtained in multiple projections after intravenous injection of radiopharmaceutical. Ventilation scans  intentionally deferred if perfusion scan and chest x-ray adequate for interpretation during COVID 19 epidemic. RADIOPHARMACEUTICALS:  4.25 mCi Tc-71m MAA IV COMPARISON:  Same day chest radiograph FINDINGS: Cardiomegaly.  No suspicious perfusion defect identified. IMPRESSION: Very low probability examination for pulmonary embolism by modified perfusion only PIOPED criteria (PE absent). Electronically Signed   By: Eddie Candle M.D.   On: 06/21/2020 14:59    Scheduled Meds: . atorvastatin  40 mg Oral q1800  . carvedilol  6.25 mg Oral BID WC  . clopidogrel  75 mg Oral Daily  . ferrous sulfate  325 mg Oral Once per day on Mon Wed Fri  . gabapentin  300 mg Oral BID  . hydrALAZINE  50 mg Oral BID  . lidocaine  1 patch Transdermal Q24H  . loratadine  10 mg Oral Daily  . multivitamin  1 tablet Oral Daily  . multivitamin  1 tablet Oral q morning  . NIFEdipine  60 mg Oral Daily  . polycarbophil  1,250 mg Oral BID  . sodium bicarbonate  650 mg Oral BID  . sodium chloride flush  3 mL Intravenous Q12H   Continuous Infusions: . sodium chloride       LOS: 1 day   Time spent: 40 minutes. More than 50% of the time was spent in counseling/coordination of care  Lorella Nimrod, MD [facility]  If 7PM-7AM, please contact night-coverage [email or web address]  06/22/2020, 2:11 PM   This record has been created using Systems analyst. Errors have been sought and corrected,but may not always be located. Such creation errors do not reflect on the standard of care.

## 2020-06-22 NOTE — Progress Notes (Signed)
Mobility Specialist - Progress Note   06/22/20 1100  Mobility  Activity Ambulated in hall  Level of Assistance Standby assist, set-up cues, supervision of patient - no hands on  Assistive Device None  Distance Ambulated (ft) 180 ft  Mobility Response Tolerated well  Mobility performed by Mobility specialist  $Mobility charge 1 Mobility    Pre-mobility: 87 HR, 88% SpO2 During mobility: 93 HR, 94% SpO2 Post-mobility: 89 HR, 95% SpO2   Pt utilizing RA on arrival with sats fluctuating between 86-89%. Educated and engaged in PLB. Mobility reapplied [place] on 1L for ambulation with sats maintaining mid 90s throughout activity. Denied use of home O2 PTA. SBA with CGA follow. No AD. No LOB. Voiced SOB. Upon return to room, pt began c/o sciatic pain, requesting patch at this time. Mobility provided therapeutic massage for relief. Pt left on RA with [place] on 1L and within reach if needed. RPE 4/10 with most difficult part reported as breathing management as pt states she's "a mouth breather". Family at bedside.    Kathee Delton Mobility Specialist 06/22/20, 11:36 AM

## 2020-06-22 NOTE — Consult Note (Signed)
  Heart Failure Nurse Navigator Note  HF echocardiogram is pending at this time.  She presented to the emergency room with complaints of shortness of breath, with apnea and nausea and vomiting.  Comorbidities:  Chronic kidney disease stage V Hypertension Anemia Diabetes Hypertension  Medications:  Carvedilol 3.125 mg twice a day Plavix 75 mg daily Nifedipine 60 mg daily  To start Lipitor 40 mg daily and hydralazine 50 mg twice a day.  Labs:  Sodium 140, potassium 3.7, chloride 106, CO2 22, BUN 59, creatinine 5.99 troponin peaked at 362 trending down down to 310, BNP 709 Intake not documented Output 1000 mL Weight is 63. 7 kg   Assessment:  General-she is lying almost supine in bed in no acute distress, husband is present at the bedside  HEENT-pupils are equal, Franks crease noted in bilateral lower earlobes  Cardiac-heart tones of regular rate and rhythm  Chest-breath sounds are clear to posterior auscultation.  Abdomen soft non tender  Musculoskeletal there is no lower extremity edema noted  Psych-is pleasant and appropriate makes good eye contact  Neurologic-beaches clear, moves all extremities without difficulty.   Initial meeting with patient and husband today.  Discussed diet, does not use salt and reads labels, for some time she has been limiting her fluid intake.  She is to be scheduled for assessment of  fistula and start dialysis.  Discussed following up in the heart failure clinic as an outpatient she was given information about the clinic and an appointment time.  Discussed the zone magnet and was given the living with heart failure teaching booklet.  Pricilla Riffle RN [facility]

## 2020-06-22 NOTE — Progress Notes (Signed)
ANTICOAGULATION CONSULT NOTE   Pharmacy Consult for Heparin Drip Indication: chest pain/ACS  Patient Measurements: Heparin Dosing Weight: 61.7 kg   Labs: Recent Labs    06/19/20 1002 06/21/20 1027 06/21/20 1040 06/21/20 1040 06/21/20 1227 06/21/20 1534 06/21/20 1837 06/21/20 2256 06/22/20 0555 06/22/20 0752  HGB 9.1*  --   --   --  9.5*  --   --   --   --   --   HCT 28.7*  --   --   --  30.0*  --   --   --   --   --   PLT  --   --   --   --  265  --   --   --   --   --   APTT  --   --  27  --   --   --   --   --   --   --   LABPROT  --   --  12.9  --   --   --   --   --   --   --   INR  --   --  1.0  --   --   --   --   --   --   --   HEPARINUNFRC  --   --   --   --   --   --   --  0.18*  --  0.41  CREATININE  --  5.75*  --   --   --   --   --   --  5.99*  --   TROPONINIHS  --   --  257*   < > 313* 362* 310*  --   --   --    < > = values in this interval not displayed.    Estimated Creatinine Clearance: 7.1 mL/min (A) (by C-G formula based on SCr of 5.99 mg/dL (H)).   Medical History: Past Medical History:  Diagnosis Date  . Anemia   . Cancer [facility]) 2011   rectal ca/chemo /rad Lenore Manner  . Chronic kidney disease   . Diabetes mellitus without complication ([place])    type 2  . Hypertension   . Personal history of chemotherapy   . Personal history of radiation therapy   . TIA (transient ischemic attack)     Assessment: Patient is a 80yo female admitted for shortness of breath and worsening orthopnea. Troponin is trending up 257 > 313. Pharmacy consulted for heparin dosing. No prior anticoagulants noted.   4/14 2256 HL 0.18; 750 units/hr 4/15 0752 HL 0.41, 900 units/hr  Goal of Therapy:  Heparin level 0.3-0.7 units/ml Monitor platelets by anticoagulation protocol: Yes   Plan:  --Heparin level is therapeutic x 1. Continue heparin infusion at 900 units/hr --Re-check confirmatory HL in 8 hours --Daily CBC per protocol while on IV heparin; AM CBC pending  Benita Gutter 06/22/2020 8:46 AM

## 2020-06-22 NOTE — Consult Note (Signed)
[facility]  ROUNDING NOTE   Subjective:   Misty [name] is a 80 y.o. female with a past medical history of diabetes, CKD stage 5, hypertension, rectal cancer requiring chemo and radiation therapy. She presented to ED with complaints of shortness of breath.   We were consulted to evaluate worsening CKD stage 5. She is a current patient of [facility] and had an appt yesterday to have fistula placed. On arrival, her blood work consisted of BUN 52, Creatinine 5.75, and EGFR 7. Electrolytes within range. She states she felt short of breath the night prior to coming to ED. She also states she had nausea and vomiting for a day or so prior. Also endorses decreased appetite, but food taste as it should. She is currently on 4L O2 and says she feels better. Denies nausea and vomiting since admission. Husband at bedside  Objective:  Vital signs in last 24 hours:  Temp:  [98 F (36.7 C)-98.1 F (36.7 C)] 98.1 F (36.7 C) (04/15 1520) Pulse Rate:  [88-118] 88 (04/15 1520) Resp:  [15-22] 19 (04/15 1520) BP: (167-203)/(63-95) 193/69 (04/15 1520) SpO2:  [94 %-98 %] 98 % (04/15 1520) Weight:  [62.6 kg-63.7 kg] 62.6 kg (04/15 1025)  Weight change:  Filed Weights   06/21/20 1018 06/22/20 0411 06/22/20 1025  Weight: 61.7 kg 63.7 kg 62.6 kg    Intake/Output: I/O last 3 completed shifts: In: 1076.7 [P.O.:860; I.V.:216.7] Out: 2075 [Urine:2075]   Intake/Output this shift:  No intake/output data recorded.  Physical Exam: General: NAD, resting comfortable  Head: Normocephalic, atraumatic. Moist oral mucosal membranes  Eyes: Anicteric  Lungs:  Clear to auscultation, O2 4L  Heart: Regular rate and rhythm  Abdomen:  Soft, nontender,   Extremities:  no peripheral edema.  Neurologic: Nonfocal, moving all four extremities  Skin: No lesions       Basic Metabolic Panel: Recent Labs  Lab 06/21/20 1027 06/22/20 0555  NA 142 140  K 4.2 3.7  CL 107 106  CO2 24 22  GLUCOSE  111* 170*  BUN 52* 59*  CREATININE 5.75* 5.99*  CALCIUM 9.4 9.0    Liver Function Tests: No results for input(s): AST, ALT, ALKPHOS, BILITOT, PROT, ALBUMIN in the last 168 hours. No results for input(s): LIPASE, AMYLASE in the last 168 hours. No results for input(s): AMMONIA in the last 168 hours.  CBC: Recent Labs  Lab 06/19/20 1002 06/21/20 1227 06/22/20 0752  WBC  --  10.7* 13.4*  NEUTROABS  --  9.4*  --   HGB 9.1* 9.5* 8.9*  HCT 28.7* 30.0* 27.8*  MCV  --  87.2 87.4  PLT  --  265 282    Cardiac Enzymes: No results for input(s): CKTOTAL, CKMB, CKMBINDEX, TROPONINI in the last 168 hours.  BNP: Invalid input(s): POCBNP  CBG: Recent Labs  Lab 06/22/20 1712  GLUCAP 177*    Microbiology: Results for orders placed or performed during the hospital encounter of 06/21/20  Resp Panel by RT-PCR (Flu A&B, Covid) Nasopharyngeal Swab     Status: None   Collection Time: 06/21/20 10:27 AM   Specimen: Nasopharyngeal Swab; Nasopharyngeal(NP) swabs in vial transport medium  Result Value Ref Range Status   SARS Coronavirus 2 by RT PCR NEGATIVE NEGATIVE Final    Comment: (NOTE) SARS-CoV-2 target nucleic acids are NOT DETECTED.  The SARS-CoV-2 RNA is generally detectable in upper respiratory specimens during the acute phase of infection. The lowest concentration of SARS-CoV-2 viral copies this assay can detect is 138  copies/mL. A negative result does not preclude SARS-Cov-2 infection and should not be used as the sole basis for treatment or other patient management decisions. A negative result may occur with  improper specimen collection/handling, submission of specimen other than nasopharyngeal swab, presence of viral mutation(s) within the areas targeted by this assay, and inadequate number of viral copies(<138 copies/mL). A negative result must be combined with clinical observations, patient history, and epidemiological information. The expected result is Negative.  Fact  Sheet for Patients:  EntrepreneurPulse.com.au  Fact Sheet for Healthcare Providers:  IncredibleEmployment.be  This test is no t yet approved or cleared by the [place] FDA and  has been authorized for detection and/or diagnosis of SARS-CoV-2 by FDA under an Emergency Use Authorization (EUA). This EUA will remain  in effect (meaning this test can be used) for the duration of the COVID-19 declaration under Section 564(b)(1) of the Act, 21 U.S.C.section 360bbb-3(b)(1), unless the authorization is terminated  or revoked sooner.       Influenza A by PCR NEGATIVE NEGATIVE Final   Influenza B by PCR NEGATIVE NEGATIVE Final    Comment: (NOTE) The Xpert Xpress SARS-CoV-2/FLU/RSV plus assay is intended as an aid in the diagnosis of influenza from Nasopharyngeal swab specimens and should not be used as a sole basis for treatment. Nasal washings and aspirates are unacceptable for Xpert Xpress SARS-CoV-2/FLU/RSV testing.  Fact Sheet for Patients: EntrepreneurPulse.com.au  Fact Sheet for Healthcare Providers: IncredibleEmployment.be  This test is not yet approved or cleared by the [place] FDA and has been authorized for detection and/or diagnosis of SARS-CoV-2 by FDA under an Emergency Use Authorization (EUA). This EUA will remain in effect (meaning this test can be used) for the duration of the COVID-19 declaration under Section 564(b)(1) of the Act, 21 U.S.C. section 360bbb-3(b)(1), unless the authorization is terminated or revoked.  Performed at [facility], [address] [place], [place] [address]     Coagulation Studies: Recent Labs    06/21/20 1040  LABPROT 12.9  INR 1.0    Urinalysis: No results for input(s): COLORURINE, LABSPEC, PHURINE, GLUCOSEU, HGBUR, BILIRUBINUR, KETONESUR, PROTEINUR, UROBILINOGEN, NITRITE, LEUKOCYTESUR in the last 72 hours.  Invalid input(s): APPERANCEUR     Imaging: DG Chest 2 View  Result Date: 06/21/2020 CLINICAL DATA:  Shortness of breath EXAM: CHEST - 2 VIEW COMPARISON:  None. FINDINGS: Heart is normal size. Aortic atherosclerosis. Bibasilar airspace opacities. Small bilateral pleural effusions. No acute bony abnormality. IMPRESSION: Small bilateral pleural effusions with bibasilar atelectasis or infiltrates. Electronically Signed   By: Rolm Baptise M.D.   On: 06/21/2020 11:21   NM Pulmonary Perfusion  Result Date: 06/21/2020 CLINICAL DATA:  PE suspected, shortness of breath EXAM: NUCLEAR MEDICINE PERFUSION LUNG SCAN TECHNIQUE: Perfusion images were obtained in multiple projections after intravenous injection of radiopharmaceutical. Ventilation scans intentionally deferred if perfusion scan and chest x-ray adequate for interpretation during COVID 19 epidemic. RADIOPHARMACEUTICALS:  4.25 mCi Tc-97mMAA IV COMPARISON:  Same day chest radiograph FINDINGS: Cardiomegaly.  No suspicious perfusion defect identified. IMPRESSION: Very low probability examination for pulmonary embolism by modified perfusion only PIOPED criteria (PE absent). Electronically Signed   By: AEddie CandleM.D.   On: 06/21/2020 14:59     Medications:   . sodium chloride     . atorvastatin  40 mg Oral q1800  . carvedilol  6.25 mg Oral BID WC  . clopidogrel  75 mg Oral Daily  . ferrous sulfate  325 mg Oral Once per day on  Mon Wed Fri  . gabapentin  300 mg Oral BID  . hydrALAZINE  50 mg Oral BID  . insulin aspart  0-5 Units Subcutaneous QHS  . insulin aspart  0-9 Units Subcutaneous TID WC  . lidocaine  1 patch Transdermal Q24H  . loratadine  10 mg Oral Daily  . multivitamin  1 tablet Oral Daily  . multivitamin  1 tablet Oral q morning  . [START ON 06/23/2020] NIFEdipine  90 mg Oral Daily  . polycarbophil  1,250 mg Oral BID  . sodium bicarbonate  650 mg Oral BID  . sodium chloride flush  3 mL Intravenous Q12H   sodium chloride, acetaminophen, albuterol, calcium  carbonate, labetalol, ondansetron (ZOFRAN) IV, sodium chloride flush  Assessment/ Plan:  Ms. Insiya Oshea is a 80 y.o.  female with a past medical history of diabetes, CKD stage 5, hypertension, rectal cancer requiring chemo and radiation therapy.    1. Acute Kidney Injury on chronic kidney disease stage 5 with baseline creatinine 3.65 on 08/11/19.  - Current creatinine 5.99 with EGFR 7 - Received Lasix 35m - 80 mg for fluid management - Hold further lasix unless necessary - Discussed with patient the current state of renal failure and if placement of HD access can be obtained while in hospital. Informed patient that if AVF was provided, it may not be matured at the time it is needed. Suggested patient get HD cath for more urgent use.  Patient and family would like to speak with their nephrologist before making a confirmed decision on which access.  - Avoid nephrotoxic treatments and therapies, if possible.   2. Anemia of chronic kidney disease Lab Results  Component Value Date   HGB 8.9 (L) 06/22/2020  [facility]   3. Secondary Hyperparathyroidism: with outpatient labs: PTH 220, phosphorus 7.0, calcium 9.2 on 05/21/20.   Lab Results  Component Value Date   CALCIUM 9.0 06/22/2020  Calcium carbonate outpatient Will monitor these labs during admission  4. 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 Home regimen includes Humalog and Lantus. Most recent hemoglobin A1c is 6.0 on 02/23/20.  - currently stable  - SSi managed by primary team  Appreciate the consult   LOS: 1 SDetmold4/15/20227:43 PM

## 2020-06-22 NOTE — Plan of Care (Signed)

## 2020-06-22 NOTE — Consult Note (Signed)
[facility] Consultation Note  Patient ID: Courtany Mcmurphy, MRN: [id number], DOB/AGE: November 07, 1940 80 y.o. Admit date: 06/21/2020   Date of Consult: 06/22/2020 Primary Physician: Gayland Curry, MD Primary Cardiologist: [facility]  Chief Complaint:  Chief Complaint  Patient presents with  . Shortness of Breath   Reason for Consult: Heart failure  HPI: 80 y.o. 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  This is progressed over the last several weeks for which she is unable to get relief.  The patient arrived in the emergency room with a chest x-ray showing small bilateral pleural effusions a BNP of 709 a hemoglobin of 9.5 a GFR of 70 a troponin of 310/362/313 consistent with demand ischemia rather than acute coronary syndrome.  Previous echocardiogram last year showed normal LV systolic function with ejection fraction of 55% with moderate aortic valve stenosis relatively stable.  She has received intravenous Lasix once with some improvements of her symptoms and less concerns for shortness of breath this a.m.  She does have appropriate medication management for high intensity cholesterol therapy with atorvastatin and blood pressure control with carvedilol hydralazine lisinopril nifedipine for which she tolerates well.  In addition to that she is on Plavix for peripheral vascular disease and previous TIA with no current evidence of bleeding complications.  She is hemodynamically stable today  Past Medical History:  Diagnosis Date  . Anemia   . Cancer [facility]) 2011   rectal ca/chemo /rad Lenore Manner  . Chronic kidney disease   . Diabetes mellitus without complication ([facility])    type 2  . Hypertension   . Personal history of chemotherapy   . Personal history of radiation  therapy   . TIA (transient ischemic attack)       Surgical History:  Past Surgical History:  Procedure Laterality Date  . BREAST BIOPSY Left    bx/ clip-neg  . COLONOSCOPY N/A 05/03/2020   Procedure: COLONOSCOPY;  Surgeon: Lesly Rubenstein, MD;  Location: [facility] ENDOSCOPY;  Service: Endoscopy;  Laterality: N/A;  . coloretcal cancer    . thyroid gland     surgery / unknown date     Home Meds: Prior to Admission medications   Medication Sig Start Date End Date Taking? Authorizing Provider  atorvastatin (LIPITOR) 40 MG tablet Take 40 mg by mouth daily at 6 PM.  07/27/18  Yes [provider]  B Complex-C-Folic Acid (RENAL) 1 MG CAPS TK 1 C PO D 07/26/18  Yes [provider]  calcium carbonate (TUMS EX) 750 MG chewable tablet Chew 750 mg by mouth daily as needed.   Yes [provider]  carvedilol (COREG) 3.125 MG tablet Take 3.125 mg by mouth 2 (two) times daily with a meal.  08/02/18  Yes [provider]  Cetirizine HCl 10 MG CAPS Take 1 capsule by mouth daily.    Yes [provider]  clopidogrel (PLAVIX) 75 MG tablet Take 75 mg by mouth daily.    Yes [provider]  ferrous sulfate 325 (65 FE) MG tablet Take 325 mg by mouth 3 (three) times a week.    Yes [provider]  Fiber Diet TABS Take 2 tablets by mouth 2 (two) times daily.    Yes [provider]  gabapentin (NEURONTIN) 300 MG capsule Take 300 mg by mouth 2 (two) times daily.  08/31/18  Yes [provider]  HUMALOG 100 UNIT/ML injection Inject into the skin as directed. Sliding Scale 05/20/18  Yes [provider]  hydrALAZINE (APRESOLINE) 25 MG tablet Take 1 tablet (25 mg total) by mouth 2 (two) times daily. 05/04/20  Yes Fritzi Mandes, MD  insulin glargine (LANTUS) 100 UNIT/ML injection Inject 0.1 mLs (10 Units total) into the skin 2 (two) times daily. Patient taking differently: Inject 35 Units into the skin 2 (two) times daily. 35 units in the  morning, and 35 units at night time 05/04/20  Yes Fritzi Mandes, MD  lidocaine (LIDODERM) 5 % Place onto the skin.   Yes [provider]  lisinopril (ZESTRIL) 10 MG tablet Take 10 mg by mouth daily. 08/15/19  Yes [provider]  multivitamin (RENA-VIT) TABS tablet Take 1 tablet by mouth every morning. 05/22/20  Yes [provider]  NIFEdipine (ADALAT CC) 60 MG 24 hr tablet Take 60 mg by mouth daily.  10/20/17  Yes [provider]  NIFEdipine (PROCARDIA XL/NIFEDICAL-XL) 90 MG 24 hr tablet Take 90 mg by mouth daily. 05/22/20  Yes [provider]  sodium bicarbonate 650 MG tablet Take 1 tablet by mouth in the morning and at bedtime.   Yes [provider]  acetaminophen (TYLENOL) 325 MG tablet Take 325 mg by mouth every 4 (four) hours as needed.     [provider]  colestipol (COLESTID) 1 g tablet Take 1 g by mouth 2 (two) times daily.  10/04/18 12/22/19  [provider]  glucose blood test strip 1 each by Finger Stick route 4 times daily (after meals and nightly). Use as directed 12/24/17   [provider]  INSULIN SYRINGE 1CC/29G 29G X 1/2" 1 ML MISC     [provider]  SURE COMFORT PEN NEEDLES 31G X 8 MM [place]  08/28/18   [provider]    Inpatient Medications:  . atorvastatin  40 mg Oral q1800  . carvedilol  3.125 mg Oral BID WC  . clopidogrel  75 mg Oral Daily  . ferrous sulfate  325 mg Oral Once per day on Mon Wed Fri  . gabapentin  300 mg Oral BID  . hydrALAZINE  25 mg Oral BID  . lidocaine  1 patch Transdermal Q24H  . lisinopril  10 mg Oral Daily  . loratadine  10 mg Oral Daily  . multivitamin  1 tablet Oral Daily  . multivitamin  1 tablet Oral q morning  . NIFEdipine  60 mg Oral Daily  . polycarbophil  1,250 mg Oral BID  . sodium bicarbonate  650 mg Oral BID  . sodium chloride flush  3 mL Intravenous Q12H   . sodium chloride    . heparin 900 Units/hr (06/22/20 0047)    Allergies:   Allergies  Allergen Reactions  . Succinylcholine Other (See Comments)    Severe myalgia's  . Penicillins Rash    Social History   Socioeconomic History  . Marital status: Married    Spouse name: Not on file  . Number of children: Not on file  . Years of education: Not on file  . Highest education level: Not on file  Occupational History  . Not on file  Tobacco Use  . Smoking status: Never Smoker  . Smokeless tobacco: Never Used  Vaping Use  . Vaping Use: Never used  Substance and Sexual Activity  . Alcohol use: Never  . Drug use: Never  . Sexual activity: Not on file  Other Topics Concern  . Not on file  Social History Narrative  . Not on file   Social Determinants of Health   Financial Resource Strain: Not on file  Food Insecurity: Not on file  Transportation Needs: Not on file  Physical Activity: Not on file  Stress: Not on file  Social Connections: Not on file  Intimate Partner Violence: Not on file     Family History  Problem Relation Age of Onset  . Lung disease Sister   . Diabetes Mellitus II Paternal Grandmother   . Breast cancer Neg Hx      Review of Systems Positive for shortness of breath Negative for: General:  chills, fever, night sweats or weight changes.  Cardiovascular: PND orthopnea syncope dizziness  Dermatological skin lesions rashes Respiratory: Cough congestion Urologic: Frequent urination urination at night and hematuria Abdominal: negative for nausea, vomiting, diarrhea, bright red blood per rectum, melena, or hematemesis Neurologic: negative for visual changes, and/or hearing changes  All other systems reviewed and are otherwise negative except as noted above.  Labs: No results for input(s): CKTOTAL, CKMB, TROPONINI in the last 72 hours. Lab Results  Component Value Date   WBC 13.4 (H) 06/22/2020   HGB 8.9 (L) 06/22/2020   HCT 27.8 (L) 06/22/2020   MCV 87.4 06/22/2020   PLT 282 06/22/2020    Recent Labs  Lab  06/22/20 0555  NA 140  K 3.7  CL 106  CO2 22  BUN 59*  CREATININE 5.99*  CALCIUM 9.0  GLUCOSE 170*   No results found for: CHOL, HDL, LDLCALC, TRIG Lab Results  Component Value Date   DDIMER 0.63 (H) 06/21/2020    Radiology/Studies:  DG Chest 2 View  Result Date: 06/21/2020 CLINICAL DATA:  Shortness of breath EXAM: CHEST - 2 VIEW COMPARISON:  None. FINDINGS: Heart is normal size. Aortic atherosclerosis. Bibasilar airspace opacities. Small bilateral pleural effusions. No acute bony abnormality. IMPRESSION: Small bilateral pleural effusions with bibasilar atelectasis or infiltrates. Electronically Signed   By: Rolm Baptise M.D.   On: 06/21/2020 11:21   NM Pulmonary Perfusion  Result Date: 06/21/2020 CLINICAL DATA:  PE suspected, shortness of breath EXAM: NUCLEAR MEDICINE PERFUSION LUNG SCAN TECHNIQUE: Perfusion images were obtained in multiple projections after intravenous injection of radiopharmaceutical. Ventilation scans intentionally deferred if perfusion scan and chest x-ray adequate for interpretation during COVID 19 epidemic. RADIOPHARMACEUTICALS:  4.25 mCi Tc-56m MAA IV COMPARISON:  Same day chest radiograph FINDINGS: Cardiomegaly.  No suspicious perfusion defect identified. IMPRESSION: Very low probability examination for pulmonary embolism by modified perfusion only PIOPED criteria (PE absent). Electronically Signed   By: Eddie Candle M.D.   On: 06/21/2020 14:59    EKG: Normal sinus rhythm with nonspecific ST changes  Weights: Filed Weights   06/21/20 1018 06/22/20 0411  Weight: 61.7 kg 63.7 kg     Physical Exam: Blood pressure (!) 177/63, pulse 95, temperature 98 F (36.7 C), temperature source Oral, resp. rate 18, height 5\' 3"  (1.6 m), weight 63.7 kg, SpO2 96 %. Body mass index is 24.89 kg/m. General: Well developed, well nourished, in no acute distress. Head eyes ears nose throat: Normocephalic, atraumatic, sclera non-icteric, no xanthomas, nares are without  discharge. No apparent thyromegaly and/or mass  Lungs: Normal respiratory effort.  no wheezes, few basilar rales, no rhonchi.  Slight decreased basilar breath sounds Heart: RRR with normal S1 S2.  3+ right upper sternal border   murmur gallop, no rub, PMI is normal size and placement, carotid upstroke normal  without bruit, jugular venous pressure is normal Abdomen: Soft, non-tender, non-distended with normoactive bowel sounds. No hepatomegaly. No rebound/guarding. No obvious abdominal masses. Abdominal aorta is normal size without bruit Extremities: Trace edema. no cyanosis, no clubbing, no ulcers  Peripheral : 2+ bilateral upper extremity pulses, 2+ bilateral femoral pulses, 2+ bilateral dorsal pedal pulse Neuro: Alert and oriented. No facial asymmetry. No focal deficit. Moves all extremities spontaneously. Musculoskeletal: Normal muscle tone without kyphosis Psych:  Responds to questions appropriately with a normal affect.    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  Further diuresis with potential intravenous Lasix either by Lasix drip and/or other treatments but would defer to nephrology for primary treatment options due to severe stage V chronic kidney disease with concerns of exacerbation 2.  Plavix for peripheral vascular disease and history of TIA without change 3.  No change in hypertension control due to severe malignant hypertension at this time and will increase carvedilol dose today for better blood pressure control watching for heart rate issues 4.  High intensity cholesterol therapy 5.  No further cardiac diagnostics necessary at this time due to no evidence of  acute coronary syndrome myocardial infarction and a recent echocardiogram showing normal LV systolic function with stable moderate aortic valve stenosis 6.  Again ambulation with above with consideration of treatment options as per nephrology  Signed, Corey Skains M.D. [facility] 06/22/2020, 10:18 AM

## 2020-06-22 NOTE — Progress Notes (Signed)
*  PRELIMINARY RESULTS* Echocardiogram 2D Echocardiogram has been performed.  Misty [name] 06/22/2020, 2:40 PM

## 2020-06-23 DIAGNOSIS — I5031 Acute diastolic (congestive) heart failure: Secondary | ICD-10-CM | POA: Diagnosis not present

## 2020-06-23 LAB — ECHOCARDIOGRAM COMPLETE
AR max vel: 0.97 cm2
AV Area VTI: 0.91 cm2
AV Area mean vel: 0.86 cm2
AV Mean grad: 19.7 mmHg
AV Peak grad: 32.6 mmHg
Ao pk vel: 2.86 m/s
Area-P 1/2: 3.39 cm2
Height: 63 in
P 1/2 time: 474 msec
S' Lateral: 2.78 cm
Weight: 2206.4 oz

## 2020-06-23 LAB — CBC
HCT: 29 % — ABNORMAL LOW (ref 36.0–46.0)
Hemoglobin: 9.4 g/dL — ABNORMAL LOW (ref 12.0–15.0)
MCH: 28.2 pg (ref 26.0–34.0)
MCHC: 32.4 g/dL (ref 30.0–36.0)
MCV: 87.1 fL (ref 80.0–100.0)
Platelets: 274 10*3/uL (ref 150–400)
RBC: 3.33 MIL/uL — ABNORMAL LOW (ref 3.87–5.11)
RDW: 16 % — ABNORMAL HIGH (ref 11.5–15.5)
WBC: 11.5 10*3/uL — ABNORMAL HIGH (ref 4.0–10.5)
nRBC: 0.3 % — ABNORMAL HIGH (ref 0.0–0.2)

## 2020-06-23 LAB — GLUCOSE, CAPILLARY
Glucose-Capillary: 142 mg/dL — ABNORMAL HIGH (ref 70–99)
Glucose-Capillary: 207 mg/dL — ABNORMAL HIGH (ref 70–99)

## 2020-06-23 LAB — BASIC METABOLIC PANEL
Anion gap: 10 (ref 5–15)
BUN: 65 mg/dL — ABNORMAL HIGH (ref 8–23)
CO2: 24 mmol/L (ref 22–32)
Calcium: 9 mg/dL (ref 8.9–10.3)
Chloride: 107 mmol/L (ref 98–111)
Creatinine, Ser: 5.67 mg/dL — ABNORMAL HIGH (ref 0.44–1.00)
GFR, Estimated: 7 mL/min — ABNORMAL LOW (ref >60)
Glucose, Bld: 153 mg/dL — ABNORMAL HIGH (ref 70–99)
Potassium: 3.7 mmol/L (ref 3.5–5.1)
Sodium: 141 mmol/L (ref 135–145)

## 2020-06-23 MED ORDER — GABAPENTIN 300 MG PO CAPS: 300.0000 mg | ORAL_CAPSULE | Freq: Every day | ORAL | 1 refills | Status: DC

## 2020-06-23 MED ORDER — FUROSEMIDE 40 MG PO TABS
40.0000 mg | ORAL_TABLET | Freq: Every day | ORAL | Status: DC
  Administered 2020-06-23: 40 mg via ORAL
  Filled 2020-06-23: qty 1

## 2020-06-23 MED ORDER — CARVEDILOL 6.25 MG PO TABS: 6.2500 mg | ORAL_TABLET | Freq: Two times a day (BID) | ORAL | 1 refills | Status: DC

## 2020-06-23 MED ORDER — HYDRALAZINE HCL 50 MG PO TABS
50.0000 mg | ORAL_TABLET | Freq: Three times a day (TID) | ORAL | Status: DC
  Administered 2020-06-23 (×2): 50 mg via ORAL
  Filled 2020-06-23: qty 1

## 2020-06-23 MED ORDER — FUROSEMIDE 40 MG PO TABS: 40.0000 mg | ORAL_TABLET | Freq: Every day | ORAL | 1 refills | Status: DC

## 2020-06-23 MED ORDER — HYDRALAZINE HCL 50 MG PO TABS: 50.0000 mg | ORAL_TABLET | Freq: Three times a day (TID) | ORAL | 1 refills | Status: DC

## 2020-06-23 NOTE — Discharge Summary (Signed)
Physician Discharge Summary  Misty [name] QMV:[id number] DOB: 04-25-1940 DOA: 06/21/2020  PCP: Gayland Curry, MD  Admit date: 06/21/2020 Discharge date: 06/23/2020  Admitted From: Home Disposition: Home  Recommendations for Outpatient Follow-up:  1. Follow up with PCP in 1-2 weeks 2. Follow-up with nephrology. 3. Follow-up with cardiology 4. Please obtain BMP/CBC in one week 5. 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 Patient states that she developed shortness of breath at rest last night as well as orthopnea. He was found to be hypoxic, initially requiring nonrebreather and then later transitioned to 4 L of oxygen via nasal cannula, which improved with IV Lasix and now saturating well on room air.  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  Nephrology was consulted and patient would like to pursue the start of dialysis as an outpatient.  She will follow-up with her nephrologist on Monday for HD catheter placement and start of dialysis.  Currently stable.  Her creatinine got worse with IV diuresis so it was discontinued and she was started on Lasix 40 mg daily and her nephrologist should be able to titrate as needed.  Good urinary output.  Cardiology was also consulted and they are recommending outpatient follow-up as  current heart failure symptoms were most likely secondary to worsening renal function.  Elevated troponin but most likely secondary to demand ischemia, no concern of ACS She will continue home dose of aspirin, Plavix, beta-blocker and statin and follow-up with her cardiologist.  Patient has well-controlled diabetes with last A1c of 6.  She will continue home dose of Metformin.  Blood pressure remained elevated.  Her home dose of lisinopril was discontinued, she will continue home dose of nifedipine and her carvedilol and hydralazine dose was increased with some improvement in blood pressure.  She will need a close follow-up with her nephrologist or primary care provider for further titration of her meds.  He will continue rest of her home meds and follow-up with her providers.  Discharge Diagnoses:  Principal Problem:   Acute diastolic CHF (congestive heart failure) (HCC) Active Problems:   Multiple myeloma (HCC)   MGUS (monoclonal gammopathy of unknown significance)   Type II diabetes mellitus with renal manifestations (HCC)   CKD stage 5 due to type 2 diabetes mellitus ([place])   Acute respiratory failure ([place])   Discharge Instructions  Discharge Instructions    Diet - low sodium heart healthy   Complete by: As directed    Discharge instructions   Complete by: As directed    It was pleasure taking care of you. We made some changes to your blood pressure medications, you will stop taking lisinopril due to worsening renal function.  We increased the dose of hydralazine and carvedilol, you will continue taking nifedipine at 90 mg daily. We also added Lasix 40 mg daily to prevent any fluid accumulation in your lungs. It is very important that you keep your blood pressure under good control  to prevent any shortness of breath due to fluid accumulation in the lungs. Please follow-up closely with your nephrologist to start unit dialysis.   Increase activity slowly   Complete by: As directed       Allergies as of 06/23/2020      Reactions   Succinylcholine Other (See Comments)   Severe myalgia's   Penicillins Rash      Medication List    STOP taking these medications   lisinopril 10 MG tablet Commonly known as: ZESTRIL     TAKE these medications   acetaminophen 325 MG tablet Commonly known as: TYLENOL Take 325 mg by mouth every 4 (four) hours as needed.   atorvastatin 40 MG tablet Commonly known as: LIPITOR Take 40 mg by mouth daily at 6 PM.   calcium carbonate 750 MG chewable tablet Commonly known as: TUMS EX Chew 750 mg by mouth daily as needed.   carvedilol 6.25 MG tablet Commonly known as: COREG Take 1 tablet (6.25 mg total) by mouth 2 (two) times daily with a meal. What changed:   medication strength  how much to take   Cetirizine HCl 10 MG Caps Take 1 capsule by mouth daily.   clopidogrel 75 MG tablet Commonly known as: PLAVIX Take 75 mg by mouth daily.   colestipol 1 g tablet Commonly known as: COLESTID Take 1 g by mouth 2 (two) times daily.   ferrous sulfate 325 (65 FE) MG tablet Take 325 mg by mouth 3 (three) times a week.   Fiber Diet Tabs Take 2 tablets by mouth 2 (two) times daily.   furosemide 40 MG tablet Commonly known as: LASIX Take 1 tablet (40 mg total) by mouth daily.   gabapentin 300 MG capsule Commonly known as: NEURONTIN Take 1 capsule (300 mg total) by mouth at bedtime. What changed: when to take this   glucose blood test strip 1 each by Finger Stick route 4 times daily (after meals and nightly). Use as directed   HumaLOG 100 UNIT/ML injection Generic drug: insulin lispro Inject into the skin as directed. Sliding Scale   hydrALAZINE 50 MG tablet Commonly known as: APRESOLINE Take 1 tablet (50 mg total) by mouth every 8 (eight) hours. What changed:   medication strength  how much to take  when to take this   insulin glargine 100 UNIT/ML injection Commonly known as: LANTUS Inject 0.1 mLs (10 Units  total) into the skin 2 (two) times daily. What changed:   how much to take  additional instructions   INSULIN SYRINGE 1CC/29G 29G X 1/2" 1 ML Misc   lidocaine 5 % Commonly known as: [place] onto the skin.   NIFEdipine 90 MG 24 hr tablet Commonly known as: PROCARDIA XL/NIFEDICAL-XL Take 90 mg by mouth daily. What changed: Another medication with the same name was removed. Continue taking this medication, and follow the directions you see here.   Renal 1 MG Caps TK 1 C PO D   multivitamin Tabs tablet Take 1 tablet by mouth every morning.   sodium bicarbonate 650 MG tablet Take 1 tablet by mouth in the morning and at bedtime.   Sure Comfort Pen Needles 31G X 8 MM Misc Generic drug: Insulin Pen Needle       Follow-up Information    Corey Skains, MD Follow up in 1 week(s).   Specialty: Cardiology Contact information: [facility] [place] [address] [phone]        Gayland Curry, MD.  Schedule an appointment as soon as possible for a visit.   Specialty: Family Medicine Contact information: [place] [address] [phone]              Allergies  Allergen Reactions  . Succinylcholine Other (See Comments)    Severe myalgia's  . Penicillins Rash    Consultations:  Nephrology  Cardiology  Procedures/Studies: DG Chest 2 View  Result Date: 06/21/2020 CLINICAL DATA:  Shortness of breath EXAM: CHEST - 2 VIEW COMPARISON:  None. FINDINGS: Heart is normal size. Aortic atherosclerosis. Bibasilar airspace opacities. Small bilateral pleural effusions. No acute bony abnormality. IMPRESSION: Small bilateral pleural effusions with bibasilar atelectasis or infiltrates. Electronically Signed   By: Rolm Baptise M.D.   On: 06/21/2020 11:21   NM Pulmonary Perfusion  Result Date: 06/21/2020 CLINICAL DATA:  PE suspected, shortness of breath EXAM: NUCLEAR MEDICINE PERFUSION LUNG SCAN  TECHNIQUE: Perfusion images were obtained in multiple projections after intravenous injection of radiopharmaceutical. Ventilation scans intentionally deferred if perfusion scan and chest x-ray adequate for interpretation during COVID 19 epidemic. RADIOPHARMACEUTICALS:  4.25 mCi Tc-31mMAA IV COMPARISON:  Same day chest radiograph FINDINGS: Cardiomegaly.  No suspicious perfusion defect identified. IMPRESSION: Very low probability examination for pulmonary embolism by modified perfusion only PIOPED criteria (PE absent). Electronically Signed   By: AEddie CandleM.D.   On: 06/21/2020 14:59   ECHOCARDIOGRAM COMPLETE  Result Date: 06/23/2020    ECHOCARDIOGRAM REPORT   Patient Name:   PSTEHANIE EKSTROMDate of Exam: 06/22/2020 Medical Rec #:  [id number]        Height:       63.0 in Accession #:    [id number]       Weight:       137.9 lb Date of Birth:  1June 13, 1942        BSA:          1.651 m Patient Age:    89years          BP:           177/63 mmHg Patient Gender: F                 HR:           95 bpm. Exam Location:  [facility] Procedure: 2D Echo, Cardiac Doppler and Color Doppler Indications:     CHF- acute diastolic IC37.62 History:         Patient has no prior history of Echocardiogram examinations.                  TIA; Risk Factors:Hypertension and Diabetes.  Sonographer:     JSherrie SportRDCS (AE) Referring Phys:  ACeylonDiagnosing Phys: BSerafina RoyalsMD IMPRESSIONS  1. Left ventricular ejection fraction, by estimation, is 50 to 55%. The left ventricle has low normal function. The left ventricle has no regional wall motion abnormalities. There is moderate left ventricular hypertrophy. Left ventricular diastolic parameters are consistent with Grade II diastolic dysfunction (pseudonormalization).  2. Right ventricular systolic function is normal. The right ventricular size is normal.  3. Left atrial size was mild to moderately dilated.  4. The mitral valve is normal in structure. Mild to moderate  mitral valve regurgitation.  5. The aortic valve is calcified. Aortic valve regurgitation is mild. Mild to moderate aortic valve stenosis. FINDINGS  Left Ventricle: Left ventricular ejection fraction, by estimation, is 50 to 55%. The left ventricle has low normal function. The  left ventricle has no regional wall motion abnormalities. The left ventricular internal cavity size was normal in size. There is moderate left ventricular hypertrophy. Left ventricular diastolic parameters are consistent with Grade II diastolic dysfunction (pseudonormalization). Right Ventricle: The right ventricular size is normal. No increase in right ventricular wall thickness. Right ventricular systolic function is normal. Left Atrium: Left atrial size was mild to moderately dilated. Right Atrium: Right atrial size was normal in size. Pericardium: There is no evidence of pericardial effusion. Mitral Valve: The mitral valve is normal in structure. Mild to moderate mitral valve regurgitation. Tricuspid Valve: The tricuspid valve is normal in structure. Tricuspid valve regurgitation is mild. Aortic Valve: The aortic valve is calcified. Aortic valve regurgitation is mild. Aortic regurgitation PHT measures 474 msec. Mild to moderate aortic stenosis is present. Aortic valve mean gradient measures 19.7 mmHg. Aortic valve peak gradient measures 32.6 mmHg. Aortic valve area, by VTI measures 0.91 cm. Pulmonic Valve: The pulmonic valve was normal in structure. Pulmonic valve regurgitation is trivial. Aorta: The aortic root and ascending aorta are structurally normal, with no evidence of dilitation. IAS/Shunts: No atrial level shunt detected by color flow Doppler.  LEFT VENTRICLE PLAX 2D LVIDd:         4.80 cm  Diastology LVIDs:         2.78 cm  LV e' medial:    3.81 cm/s LV PW:         1.30 cm  LV E/e' medial:  35.4 LV IVS:        0.78 cm  LV e' lateral:   7.51 cm/s LVOT diam:     1.95 cm  LV E/e' lateral: 18.0 LV SV:         59 LV SV Index:   35  LVOT Area:     2.99 cm  RIGHT VENTRICLE RV Basal diam:  2.83 cm RV S prime:     14.90 cm/s TAPSE (M-mode): 3.7 cm LEFT ATRIUM             Index       RIGHT ATRIUM           Index LA diam:        4.10 cm 2.48 cm/m  RA Area:     11.90 cm LA Vol (A2C):   58.3 ml 35.31 ml/m RA Volume:   24.30 ml  14.72 ml/m LA Vol (A4C):   42.7 ml 25.86 ml/m LA Biplane Vol: 49.8 ml 30.16 ml/m  AORTIC VALVE                    PULMONIC VALVE AV Area (Vmax):    0.97 cm     PV Vmax:        1.13 m/s AV Area (Vmean):   0.86 cm     PV Peak grad:   5.1 mmHg AV Area (VTI):     0.91 cm     RVOT Peak grad: 4 mmHg AV Vmax:           285.67 cm/s AV Vmean:          211.667 cm/s AV VTI:            0.640 m AV Peak Grad:      32.6 mmHg AV Mean Grad:      19.7 mmHg LVOT Vmax:         92.70 cm/s LVOT Vmean:        61.000 cm/s LVOT VTI:  0.196 m LVOT/AV VTI ratio: 0.31 AI PHT:            474 msec  AORTA Ao Root diam: 2.90 cm MITRAL VALVE                TRICUSPID VALVE MV Area (PHT): 3.39 cm     TR Peak grad:   34.1 mmHg MV Decel Time: 224 msec     TR Vmax:        292.00 cm/s MV E velocity: 135.00 cm/s MV A velocity: 114.00 cm/s  SHUNTS MV E/A ratio:  1.18         Systemic VTI:  0.20 m                             Systemic Diam: 1.95 cm Serafina Royals MD Electronically signed by Serafina Royals MD Signature Date/Time: 06/23/2020/9:06:57 AM    Final      Subjective: Patient was seen and examined today.  No new complaint.  Husband at bedside.  She wants to go home as soon as possible, stating that she just could not get some sleep here.  She will follow-up with nephrology and would like to start dialysis as an outpatient. Patient wants to leave as soon as possible as today is her grandsons birthday.  Discharge Exam: Vitals:   06/22/20 2245 06/23/20 0558  BP: (!) 158/56 (!) 176/55  Pulse: 80 80  Resp: 18 18  Temp: 98.2 F (36.8 C) 98.3 F (36.8 C)  SpO2: 97% 100%   Vitals:   06/22/20 1025 06/22/20 1520 06/22/20 2245 06/23/20  0558  BP:  (!) 193/69 (!) 158/56 (!) 176/55  Pulse:  88 80 80  Resp:  _0 Temp:  98.1 F (36.7 C) 98.2 F (36.8 C) 98.3 F (36.8 C)  TempSrc:  Oral Oral Oral  SpO2:  98% 97% 100%  Weight: 62.6 kg     Height:        General: Pt is alert, awake, not in acute distress Cardiovascular: RRR, S1/S2 +, no rubs, no gallops Respiratory: CTA bilaterally, no wheezing, no rhonchi Abdominal: Soft, NT, ND, bowel sounds + Extremities: no edema, no cyanosis   The results of significant diagnostics from this hospitalization (including imaging, microbiology, ancillary and laboratory) are listed below for reference.    Microbiology: Recent Results (from the past 240 hour(s))  Resp Panel by RT-PCR (Flu A&B, Covid) Nasopharyngeal Swab     Status: None   Collection Time: 06/21/20 10:27 AM   Specimen: Nasopharyngeal Swab; Nasopharyngeal(NP) swabs in vial transport medium  Result Value Ref Range Status   SARS Coronavirus 2 by RT PCR NEGATIVE NEGATIVE Final    Comment: (NOTE) SARS-CoV-2 target nucleic acids are NOT DETECTED.  The SARS-CoV-2 RNA is generally detectable in upper respiratory specimens during the acute phase of infection. The lowest concentration of SARS-CoV-2 viral copies this assay can detect is 138 copies/mL. A negative result does not preclude SARS-Cov-2 infection and should not be used as the sole basis for treatment or other patient management decisions. A negative result may occur with  improper specimen collection/handling, submission of specimen other than nasopharyngeal swab, presence of viral mutation(s) within the areas targeted by this assay, and inadequate number of viral copies(<138 copies/mL). A negative result must be combined with clinical observations, patient history, and epidemiological information. The expected result is Negative.  Fact Sheet for Patients:  EntrepreneurPulse.com.au  Fact Sheet for Healthcare Providers:  IncredibleEmployment.be  This test is no t yet approved or cleared by the [place] and  has been authorized for detection and/or diagnosis of SARS-CoV-2 by FDA under an Emergency Use Authorization (EUA). This EUA will remain  in effect (meaning this test can be used) for the duration of the COVID-19 declaration under Section 564(b)(1) of the Act, 21 U.S.C.section 360bbb-3(b)(1), unless the authorization is terminated  or revoked sooner.       Influenza A by PCR NEGATIVE NEGATIVE Final   Influenza B by PCR NEGATIVE NEGATIVE Final    Comment: (NOTE) The Xpert Xpress SARS-CoV-2/FLU/RSV plus assay is intended as an aid in the diagnosis of influenza from Nasopharyngeal swab specimens and should not be used as a sole basis for treatment. Nasal washings and aspirates are unacceptable for Xpert Xpress SARS-CoV-2/FLU/RSV testing.  Fact Sheet for Patients: EntrepreneurPulse.com.au  Fact Sheet for Healthcare Providers: IncredibleEmployment.be  This test is not yet approved or cleared by the [place] FDA and has been authorized for detection and/or diagnosis of SARS-CoV-2 by FDA under an Emergency Use Authorization (EUA). This EUA will remain in effect (meaning this test can be used) for the duration of the COVID-19 declaration under Section 564(b)(1) of the Act, 21 U.S.C. section 360bbb-3(b)(1), unless the authorization is terminated or revoked.  Performed at [facility], [address] [place], [place] [address]      Labs: BNP (last 3 results) Recent Labs    05/02/20 1102 06/21/20 1027  BNP 205.2* 563.8*   Basic Metabolic Panel: Recent Labs  Lab 06/21/20 1027 06/22/20 0555 06/23/20 0410  NA 142 140 141  K 4.2 3.7 3.7  CL 107 106 107  CO2 _0 GLUCOSE 111* 170* 153*  BUN 52* 59* 65*  CREATININE 5.75* 5.99* 5.67*  CALCIUM 9.4 9.0 9.0   Liver Function Tests: No results for  input(s): AST, ALT, ALKPHOS, BILITOT, PROT, ALBUMIN in the last 168 hours. No results for input(s): LIPASE, AMYLASE in the last 168 hours. No results for input(s): AMMONIA in the last 168 hours. CBC: Recent Labs  Lab 06/19/20 1002 06/21/20 1227 06/22/20 0752 06/23/20 0410  WBC  --  10.7* 13.4* 11.5*  NEUTROABS  --  9.4*  --   --   HGB 9.1* 9.5* 8.9* 9.4*  HCT 28.7* 30.0* 27.8* 29.0*  MCV  --  87.2 87.4 87.1  PLT  --  265 282 274   Cardiac Enzymes: No results for input(s): CKTOTAL, CKMB, CKMBINDEX, TROPONINI in the last 168 hours. BNP: Invalid input(s): POCBNP CBG: Recent Labs  Lab 06/22/20 1712 06/22/20 2119 06/23/20 0824  GLUCAP 177* 225* 142*   D-Dimer Recent Labs    06/21/20 1040  DDIMER 0.63*   Hgb A1c No results for input(s): HGBA1C in the last 72 hours. Lipid Profile No results for input(s): CHOL, HDL, LDLCALC, TRIG, CHOLHDL, LDLDIRECT in the last 72 hours. Thyroid function studies No results for input(s): TSH, T4TOTAL, T3FREE, THYROIDAB in the last 72 hours.  Invalid input(s): FREET3 Anemia work up No results for input(s): VITAMINB12, FOLATE, FERRITIN, TIBC, IRON, RETICCTPCT in the last 72 hours. Urinalysis No results found for: COLORURINE, APPEARANCEUR, [place], [place], [place], [place], [place], [place], PROTEINUR, UROBILINOGEN, NITRITE, LEUKOCYTESUR Sepsis Labs Invalid input(s): PROCALCITONIN,  WBC,  LACTICIDVEN Microbiology Recent Results (from the past 240 hour(s))  Resp Panel by RT-PCR (Flu A&B, Covid) Nasopharyngeal Swab     Status: None   Collection Time: 06/21/20 10:27 AM   Specimen: Nasopharyngeal Swab; Nasopharyngeal(NP) swabs  in vial transport medium  Result Value Ref Range Status   SARS Coronavirus 2 by RT PCR NEGATIVE NEGATIVE Final    Comment: (NOTE) SARS-CoV-2 target nucleic acids are NOT DETECTED.  The SARS-CoV-2 RNA is generally detectable in upper respiratory specimens during the acute phase of infection. The  lowest concentration of SARS-CoV-2 viral copies this assay can detect is 138 copies/mL. A negative result does not preclude SARS-Cov-2 infection and should not be used as the sole basis for treatment or other patient management decisions. A negative result may occur with  improper specimen collection/handling, submission of specimen other than nasopharyngeal swab, presence of viral mutation(s) within the areas targeted by this assay, and inadequate number of viral copies(<138 copies/mL). A negative result must be combined with clinical observations, patient history, and epidemiological information. The expected result is Negative.  Fact Sheet for Patients:  EntrepreneurPulse.com.au  Fact Sheet for Healthcare Providers:  IncredibleEmployment.be  This test is no t yet approved or cleared by the [place] FDA and  has been authorized for detection and/or diagnosis of SARS-CoV-2 by FDA under an Emergency Use Authorization (EUA). This EUA will remain  in effect (meaning this test can be used) for the duration of the COVID-19 declaration under Section 564(b)(1) of the Act, 21 U.S.C.section 360bbb-3(b)(1), unless the authorization is terminated  or revoked sooner.       Influenza A by PCR NEGATIVE NEGATIVE Final   Influenza B by PCR NEGATIVE NEGATIVE Final    Comment: (NOTE) The Xpert Xpress SARS-CoV-2/FLU/RSV plus assay is intended as an aid in the diagnosis of influenza from Nasopharyngeal swab specimens and should not be used as a sole basis for treatment. Nasal washings and aspirates are unacceptable for Xpert Xpress SARS-CoV-2/FLU/RSV testing.  Fact Sheet for Patients: EntrepreneurPulse.com.au  Fact Sheet for Healthcare Providers: IncredibleEmployment.be  This test is not yet approved or cleared by the [place] FDA and has been authorized for detection and/or diagnosis of SARS-CoV-2 by FDA under  an Emergency Use Authorization (EUA). This EUA will remain in effect (meaning this test can be used) for the duration of the COVID-19 declaration under Section 564(b)(1) of the Act, 21 U.S.C. section 360bbb-3(b)(1), unless the authorization is terminated or revoked.  Performed at [facility], [address] [place], [place] [address]     Time coordinating discharge: Over 30 minutes  SIGNED:  Lorella Nimrod, MD  [facility] 06/23/2020, 10:53 AM  If 7PM-7AM, please contact night-coverage [email or web address]  This record has been created using Systems analyst. Errors have been sought and corrected,but may not always be located. Such creation errors do not reflect on the standard of care.

## 2020-06-23 NOTE — TOC Progression Note (Addendum)
Transition of Care [facility]) - Progression Note    Patient Details  Name: Misty [name] MRN: [id number] Date of Birth: 1940/08/02  [facility]) CM/SW Contact  Izola Price, RN Phone Number: 06/23/2020, 11:22 AM  Clinical Narrative:   Patient to be discharged today, pending ambulation oxygenation test, and PT/OT evaluations. [facility] accepted patient. RN added for CHF/DM management.   Awaiting any DME needs for discharge.  Simmie Davies RN CM [phone] 1123 am.   No DME ordered. Oxygen qualification done. [facility]ware of discharge.  Simmie Davies RN CM [phone]  108 pm. Some BP issues prior to discharge. Provider aware. [place] RN will be able to see patient tomorrow. Asked to make sure BP is checked on home visit. Simmie Davies RN CM       Barriers to Discharge: Barriers Resolved  Expected Discharge Plan and Services           Expected Discharge Date: 06/23/20                         HH Arranged: RN,PT,OT [place] Agency: [place] ([facility]) Date [place]: 06/23/20 Time [place]: 1121 Representative spoke with at [place]: [place] ([facility]) Interventions    Readmission Risk Interventions No flowsheet data found.

## 2020-06-23 NOTE — Progress Notes (Signed)
Pt BP 200/51 at 1200. MD notified. 20 MG PRN labetalol administered. Recheck at 1330 BP 172/45 (MAP 81), HR 73. MD again notified. MD ordered to give scheduled 1400 hydralazine and ok to DC after. Hydralazine administered and now preparing for DC.

## 2020-06-23 NOTE — Progress Notes (Signed)
[facility] Encounter Note  Patient: Misty [name] / Admit Date: 06/21/2020 / Date of Encounter: 06/23/2020, 9:39 AM   Subjective: Patient overall feels much better since admission.  The patient did receive intravenous Lasix for her treatment of pulmonary edema with small pleural effusions.  She has been more comfortable at this time.  There has been no evidence of myocardial infarction with flat troponins.  Additionally the patient has had an echocardiogram showing normal LV systolic function with small pericardial effusion and mild to moderate aortic valve stenosis.  There has been no evidence of progression of issues from the cardiovascular standpoint and currently is at low risk for cardiovascular complication with treatment with catheter placement for dialysis  Review of Systems: Positive for: None Negative for: Vision change, hearing change, syncope, dizziness, nausea, vomiting,diarrhea, bloody stool, stomach pain, cough, congestion, diaphoresis, urinary frequency, urinary pain,skin lesions, skin rashes Others previously listed  Objective: Telemetry: Normal sinus rhythm Physical Exam: Blood pressure (!) 176/55, pulse 80, temperature 98.3 F (36.8 C), temperature source Oral, resp. rate 18, height 5\' 3"  (1.6 m), weight 62.6 kg, SpO2 100 %. Body mass index is 24.43 kg/m. General: Well developed, well nourished, in no acute distress. Head: Normocephalic, atraumatic, sclera non-icteric, no xanthomas, nares are without discharge. Neck: No apparent masses Lungs: Normal respirations with no wheezes, no rhonchi, no rales , no crackles   Heart: Regular rate and rhythm, normal S1 S2, 3+ aortic murmur, no rub, no gallop, PMI is normal size and placement, carotid upstroke normal without bruit, jugular venous pressure normal Abdomen: Soft, non-tender, non-distended with normoactive bowel sounds. No hepatosplenomegaly. Abdominal aorta is normal size without  bruit Extremities: Trace edema, no clubbing, no cyanosis, no ulcers,  Peripheral: 2+ radial, 2+ femoral, 2+ dorsal pedal pulses Neuro: Alert and oriented. Moves all extremities spontaneously. Psych:  Responds to questions appropriately with a normal affect.   Intake/Output Summary (Last 24 hours) at 06/23/2020 0939 Last data filed at 06/23/2020 0858 Gross per 24 hour  Intake 1076.68 ml  Output 1475 ml  Net -398.32 ml    Inpatient Medications:  . atorvastatin  40 mg Oral q1800  . carvedilol  6.25 mg Oral BID WC  . clopidogrel  75 mg Oral Daily  . ferrous sulfate  325 mg Oral Once per day on Mon Wed Fri  . furosemide  40 mg Oral Daily  . gabapentin  300 mg Oral BID  . hydrALAZINE  50 mg Oral Q8H  . insulin aspart  0-5 Units Subcutaneous QHS  . insulin aspart  0-9 Units Subcutaneous TID WC  . lidocaine  1 patch Transdermal Q24H  . loratadine  10 mg Oral Daily  . multivitamin  1 tablet Oral Daily  . multivitamin  1 tablet Oral q morning  . NIFEdipine  90 mg Oral Daily  . polycarbophil  1,250 mg Oral BID  . sodium bicarbonate  650 mg Oral BID  . sodium chloride flush  3 mL Intravenous Q12H   Infusions:  . sodium chloride      Labs: Recent Labs    06/22/20 0555 06/23/20 0410  NA 140 141  K 3.7 3.7  CL 106 107  CO2 22 24  GLUCOSE 170* 153*  BUN 59* 65*  CREATININE 5.99* 5.67*  CALCIUM 9.0 9.0   No results for input(s): AST, ALT, ALKPHOS, BILITOT, PROT, ALBUMIN in the last 72 hours. Recent Labs    06/21/20 1227 06/22/20 0752 06/23/20 0410  WBC 10.7* 13.4* 11.5*  NEUTROABS  9.4*  --   --   HGB 9.5* 8.9* 9.4*  HCT 30.0* 27.8* 29.0*  MCV 87.2 87.4 87.1  PLT 265 282 274   No results for input(s): CKTOTAL, CKMB, TROPONINI in the last 72 hours. Invalid input(s): POCBNP No results for input(s): HGBA1C in the last 72 hours.   Weights: Filed Weights   06/21/20 1018 06/22/20 0411 06/22/20 1025  Weight: 61.7 kg 63.7 kg 62.6 kg     Radiology/Studies:  DG Chest 2  View  Result Date: 06/21/2020 CLINICAL DATA:  Shortness of breath EXAM: CHEST - 2 VIEW COMPARISON:  None. FINDINGS: Heart is normal size. Aortic atherosclerosis. Bibasilar airspace opacities. Small bilateral pleural effusions. No acute bony abnormality. IMPRESSION: Small bilateral pleural effusions with bibasilar atelectasis or infiltrates. Electronically Signed   By: Rolm Baptise M.D.   On: 06/21/2020 11:21   NM Pulmonary Perfusion  Result Date: 06/21/2020 CLINICAL DATA:  PE suspected, shortness of breath EXAM: NUCLEAR MEDICINE PERFUSION LUNG SCAN TECHNIQUE: Perfusion images were obtained in multiple projections after intravenous injection of radiopharmaceutical. Ventilation scans intentionally deferred if perfusion scan and chest x-ray adequate for interpretation during COVID 19 epidemic. RADIOPHARMACEUTICALS:  4.25 mCi Tc-4m MAA IV COMPARISON:  Same day chest radiograph FINDINGS: Cardiomegaly.  No suspicious perfusion defect identified. IMPRESSION: Very low probability examination for pulmonary embolism by modified perfusion only PIOPED criteria (PE absent). Electronically Signed   By: Eddie Candle M.D.   On: 06/21/2020 14:59   ECHOCARDIOGRAM COMPLETE  Result Date: 06/23/2020    ECHOCARDIOGRAM REPORT   Patient Name:   Misty [name] Date of Exam: 06/22/2020 Medical Rec #:  [id number]         Height:       63.0 in Accession #:    [id number]        Weight:       137.9 lb Date of Birth:  [birth year]         BSA:          1.651 m Patient Age:    80 years          BP:           177/63 mmHg Patient Gender: F                 HR:           95 bpm. Exam Location:  [facility] Procedure: 2D Echo, Cardiac Doppler and Color Doppler Indications:     CHF- acute diastolic W97.98  History:         Patient has no prior history of Echocardiogram examinations.                  TIA; Risk Factors:Hypertension and Diabetes.  Sonographer:     Sherrie Sport RDCS (AE) Referring Phys:  [place] Diagnosing Phys: Serafina Royals MD IMPRESSIONS  1. Left ventricular ejection fraction, by estimation, is 50 to 55%. The left ventricle has low normal function. The left ventricle has no regional wall motion abnormalities. There is moderate left ventricular hypertrophy. Left ventricular diastolic parameters are consistent with Grade II diastolic dysfunction (pseudonormalization).  2. Right ventricular systolic function is normal. The right ventricular size is normal.  3. Left atrial size was mild to moderately dilated.  4. The mitral valve is normal in structure. Mild to moderate mitral valve regurgitation.  5. The aortic valve is calcified. Aortic valve regurgitation is mild. Mild to moderate aortic valve stenosis. FINDINGS  Left Ventricle: Left ventricular ejection fraction,  by estimation, is 50 to 55%. The left ventricle has low normal function. The left ventricle has no regional wall motion abnormalities. The left ventricular internal cavity size was normal in size. There is moderate left ventricular hypertrophy. Left ventricular diastolic parameters are consistent with Grade II diastolic dysfunction (pseudonormalization). Right Ventricle: The right ventricular size is normal. No increase in right ventricular wall thickness. Right ventricular systolic function is normal. Left Atrium: Left atrial size was mild to moderately dilated. Right Atrium: Right atrial size was normal in size. Pericardium: There is no evidence of pericardial effusion. Mitral Valve: The mitral valve is normal in structure. Mild to moderate mitral valve regurgitation. Tricuspid Valve: The tricuspid valve is normal in structure. Tricuspid valve regurgitation is mild. Aortic Valve: The aortic valve is calcified. Aortic valve regurgitation is mild. Aortic regurgitation PHT measures 474 msec. Mild to moderate aortic stenosis is present. Aortic valve mean gradient measures 19.7 mmHg. Aortic valve peak gradient measures 32.6 mmHg. Aortic valve area, by VTI measures 0.91  cm. Pulmonic Valve: The pulmonic valve was normal in structure. Pulmonic valve regurgitation is trivial. Aorta: The aortic root and ascending aorta are structurally normal, with no evidence of dilitation. IAS/Shunts: No atrial level shunt detected by color flow Doppler.  LEFT VENTRICLE PLAX 2D LVIDd:         4.80 cm  Diastology LVIDs:         2.78 cm  LV e' medial:    3.81 cm/s LV PW:         1.30 cm  LV E/e' medial:  35.4 LV IVS:        0.78 cm  LV e' lateral:   7.51 cm/s LVOT diam:     1.95 cm  LV E/e' lateral: 18.0 LV SV:         59 LV SV Index:   35 LVOT Area:     2.99 cm  RIGHT VENTRICLE RV Basal diam:  2.83 cm RV S prime:     14.90 cm/s TAPSE (M-mode): 3.7 cm LEFT ATRIUM             Index       RIGHT ATRIUM           Index LA diam:        4.10 cm 2.48 cm/m  RA Area:     11.90 cm LA Vol (A2C):   58.3 ml 35.31 ml/m RA Volume:   24.30 ml  14.72 ml/m LA Vol (A4C):   42.7 ml 25.86 ml/m LA Biplane Vol: 49.8 ml 30.16 ml/m  AORTIC VALVE                    PULMONIC VALVE AV Area (Vmax):    0.97 cm     PV Vmax:        1.13 m/s AV Area (Vmean):   0.86 cm     PV Peak grad:   5.1 mmHg AV Area (VTI):     0.91 cm     RVOT Peak grad: 4 mmHg AV Vmax:           285.67 cm/s AV Vmean:          211.667 cm/s AV VTI:            0.640 m AV Peak Grad:      32.6 mmHg AV Mean Grad:      19.7 mmHg LVOT Vmax:         92.70 cm/s LVOT Vmean:  61.000 cm/s LVOT VTI:          0.196 m LVOT/AV VTI ratio: 0.31 AI PHT:            474 msec  AORTA Ao Root diam: 2.90 cm MITRAL VALVE                TRICUSPID VALVE MV Area (PHT): 3.39 cm     TR Peak grad:   34.1 mmHg MV Decel Time: 224 msec     TR Vmax:        292.00 cm/s MV E velocity: 135.00 cm/s MV A velocity: 114.00 cm/s  SHUNTS MV E/A ratio:  1.18         Systemic VTI:  0.20 m                             Systemic Diam: 1.95 cm Serafina Royals MD Electronically signed by Serafina Royals MD Signature Date/Time: 06/23/2020/9:06:57 AM    Final      Assessment and Recommendation  80  y.o. female with known mild to moderate aortic valve stenosis with left ventricular hypertrophy hypertension diabetes hyperlipidemia with chronic kidney disease stage V and pulmonary edema likely secondary to her kidney dysfunction with no evidence of acute coronary syndrome or myocardial infarction 1.  Continue surveillance of aortic valve stenosis which appears to be stable at this time and patient is low risk for cardiovascular complication with catheter placement for dialysis 2.  Continue hypertension control with carvedilol nifedipine and will increase medication management when dialysis catheter is placed 3.  No further cardiac diagnostics necessary at this time due to no evidence of myocardial infarction or acute coronary syndrome 4.  Okay for discharge home from cardiac standpoint with follow-up in 1 to 2 weeks for further adjustments of medication management  Signed, Serafina Royals M.D. FACC

## 2020-06-23 NOTE — Consult Note (Signed)
[facility]  ROUNDING NOTE   Subjective:   Misty [name] is a 80 y.o. female with a past medical history of diabetes, CKD stage 5, hypertension, rectal cancer requiring chemo and radiation therapy. She presented to ED with complaints of shortness of breath.   Husband at bedside. Patient wants to leave as her great grandchildren have a birthday.     Objective:  Vital signs in last 24 hours:  Temp:  [98.1 F (36.7 C)-98.3 F (36.8 C)] 98.3 F (36.8 C) (04/16 0558) Pulse Rate:  [80-88] 80 (04/16 0558) Resp:  [18-19] 18 (04/16 0558) BP: (158-193)/(55-69) 176/55 (04/16 0558) SpO2:  [97 %-100 %] 100 % (04/16 0558)  Weight change: 0.862 kg Filed Weights   06/21/20 1018 06/22/20 0411 06/22/20 1025  Weight: 61.7 kg 63.7 kg 62.6 kg    Intake/Output: I/O last 3 completed shifts: In: 1076.7 [P.O.:860; I.V.:216.7] Out: 2775 [Urine:2775]   Intake/Output this shift:  No intake/output data recorded.  Physical Exam: General: NAD, resting comfortable  Head: Normocephalic, atraumatic. Moist oral mucosal membranes  Eyes: Anicteric  Lungs:  Clear to auscultation, O2 1L  Heart: Regular rate and rhythm  Abdomen:  Soft, nontender,   Extremities:  no peripheral edema.  Neurologic: Nonfocal, moving all four extremities  Skin: No lesions       Basic Metabolic Panel: Recent Labs  Lab 06/21/20 1027 06/22/20 0555 06/23/20 0410  NA 142 140 141  K 4.2 3.7 3.7  CL 107 106 107  CO2 24 22 24   GLUCOSE 111* 170* 153*  BUN 52* 59* 65*  CREATININE 5.75* 5.99* 5.67*  CALCIUM 9.4 9.0 9.0    Liver Function Tests: No results for input(s): AST, ALT, ALKPHOS, BILITOT, PROT, ALBUMIN in the last 168 hours. No results for input(s): LIPASE, AMYLASE in the last 168 hours. No results for input(s): AMMONIA in the last 168 hours.  CBC: Recent Labs  Lab 06/19/20 1002 06/21/20 1227 06/22/20 0752 06/23/20 0410  WBC  --  10.7* 13.4* 11.5*  NEUTROABS  --  9.4*  --   --   HGB  9.1* 9.5* 8.9* 9.4*  HCT 28.7* 30.0* 27.8* 29.0*  MCV  --  87.2 87.4 87.1  PLT  --  265 282 274    Cardiac Enzymes: No results for input(s): CKTOTAL, CKMB, CKMBINDEX, TROPONINI in the last 168 hours.  BNP: Invalid input(s): POCBNP  CBG: Recent Labs  Lab 06/22/20 1712 06/22/20 2119 06/23/20 0824  GLUCAP 177* 225* 142*    Microbiology: Results for orders placed or performed during the hospital encounter of 06/21/20  Resp Panel by RT-PCR (Flu A&B, Covid) Nasopharyngeal Swab     Status: None   Collection Time: 06/21/20 10:27 AM   Specimen: Nasopharyngeal Swab; Nasopharyngeal(NP) swabs in vial transport medium  Result Value Ref Range Status   SARS Coronavirus 2 by RT PCR NEGATIVE NEGATIVE Final    Comment: (NOTE) SARS-CoV-2 target nucleic acids are NOT DETECTED.  The SARS-CoV-2 RNA is generally detectable in upper respiratory specimens during the acute phase of infection. The lowest concentration of SARS-CoV-2 viral copies this assay can detect is 138 copies/mL. A negative result does not preclude SARS-Cov-2 infection and should not be used as the sole basis for treatment or other patient management decisions. A negative result may occur with  improper specimen collection/handling, submission of specimen other than nasopharyngeal swab, presence of viral mutation(s) within the areas targeted by this assay, and inadequate number of viral copies(<138 copies/mL). A negative result must be combined  with clinical observations, patient history, and epidemiological information. The expected result is Negative.  Fact Sheet for Patients:  EntrepreneurPulse.com.au  Fact Sheet for Healthcare Providers:  IncredibleEmployment.be  This test is no t yet approved or cleared by the [place] FDA and  has been authorized for detection and/or diagnosis of SARS-CoV-2 by FDA under an Emergency Use Authorization (EUA). This EUA will remain  in effect  (meaning this test can be used) for the duration of the COVID-19 declaration under Section 564(b)(1) of the Act, 21 U.S.C.section 360bbb-3(b)(1), unless the authorization is terminated  or revoked sooner.       Influenza A by PCR NEGATIVE NEGATIVE Final   Influenza B by PCR NEGATIVE NEGATIVE Final    Comment: (NOTE) The Xpert Xpress SARS-CoV-2/FLU/RSV plus assay is intended as an aid in the diagnosis of influenza from Nasopharyngeal swab specimens and should not be used as a sole basis for treatment. Nasal washings and aspirates are unacceptable for Xpert Xpress SARS-CoV-2/FLU/RSV testing.  Fact Sheet for Patients: EntrepreneurPulse.com.au  Fact Sheet for Healthcare Providers: IncredibleEmployment.be  This test is not yet approved or cleared by the [place] FDA and has been authorized for detection and/or diagnosis of SARS-CoV-2 by FDA under an Emergency Use Authorization (EUA). This EUA will remain in effect (meaning this test can be used) for the duration of the COVID-19 declaration under Section 564(b)(1) of the Act, 21 U.S.C. section 360bbb-3(b)(1), unless the authorization is terminated or revoked.  Performed at [facility], [address] [place], [place] [address]     Coagulation Studies: Recent Labs    06/21/20 1040  LABPROT 12.9  INR 1.0    Urinalysis: No results for input(s): COLORURINE, LABSPEC, PHURINE, GLUCOSEU, HGBUR, BILIRUBINUR, KETONESUR, PROTEINUR, UROBILINOGEN, NITRITE, LEUKOCYTESUR in the last 72 hours.  Invalid input(s): APPERANCEUR    Imaging: NM Pulmonary Perfusion  Result Date: 06/21/2020 CLINICAL DATA:  PE suspected, shortness of breath EXAM: NUCLEAR MEDICINE PERFUSION LUNG SCAN TECHNIQUE: Perfusion images were obtained in multiple projections after intravenous injection of radiopharmaceutical. Ventilation scans intentionally deferred if perfusion scan and chest x-ray adequate for  interpretation during COVID 19 epidemic. RADIOPHARMACEUTICALS:  4.25 mCi Tc-83m MAA IV COMPARISON:  Same day chest radiograph FINDINGS: Cardiomegaly.  No suspicious perfusion defect identified. IMPRESSION: Very low probability examination for pulmonary embolism by modified perfusion only PIOPED criteria (PE absent). Electronically Signed   By: Eddie Candle M.D.   On: 06/21/2020 14:59   ECHOCARDIOGRAM COMPLETE  Result Date: 06/23/2020    ECHOCARDIOGRAM REPORT   Patient Name:   Misty [name] Date of Exam: 06/22/2020 Medical Rec #:  [id number]         Height:       63.0 in Accession #:    [id number]        Weight:       137.9 lb Date of Birth:  1940/07/05         BSA:          1.651 m Patient Age:    5 years          BP:           177/63 mmHg Patient Gender: F                 HR:           95 bpm. Exam Location:  [facility] Procedure: 2D Echo, Cardiac Doppler and Color Doppler Indications:     CHF- acute diastolic K44.01  History:  Patient has no prior history of Echocardiogram examinations.                  TIA; Risk Factors:Hypertension and Diabetes.  Sonographer:     Sherrie Sport RDCS (AE) Referring Phys:  [place] Diagnosing Phys: Serafina Royals MD IMPRESSIONS  1. Left ventricular ejection fraction, by estimation, is 50 to 55%. The left ventricle has low normal function. The left ventricle has no regional wall motion abnormalities. There is moderate left ventricular hypertrophy. Left ventricular diastolic parameters are consistent with Grade II diastolic dysfunction (pseudonormalization).  2. Right ventricular systolic function is normal. The right ventricular size is normal.  3. Left atrial size was mild to moderately dilated.  4. The mitral valve is normal in structure. Mild to moderate mitral valve regurgitation.  5. The aortic valve is calcified. Aortic valve regurgitation is mild. Mild to moderate aortic valve stenosis. FINDINGS  Left Ventricle: Left ventricular ejection fraction, by  estimation, is 50 to 55%. The left ventricle has low normal function. The left ventricle has no regional wall motion abnormalities. The left ventricular internal cavity size was normal in size. There is moderate left ventricular hypertrophy. Left ventricular diastolic parameters are consistent with Grade II diastolic dysfunction (pseudonormalization). Right Ventricle: The right ventricular size is normal. No increase in right ventricular wall thickness. Right ventricular systolic function is normal. Left Atrium: Left atrial size was mild to moderately dilated. Right Atrium: Right atrial size was normal in size. Pericardium: There is no evidence of pericardial effusion. Mitral Valve: The mitral valve is normal in structure. Mild to moderate mitral valve regurgitation. Tricuspid Valve: The tricuspid valve is normal in structure. Tricuspid valve regurgitation is mild. Aortic Valve: The aortic valve is calcified. Aortic valve regurgitation is mild. Aortic regurgitation PHT measures 474 msec. Mild to moderate aortic stenosis is present. Aortic valve mean gradient measures 19.7 mmHg. Aortic valve peak gradient measures 32.6 mmHg. Aortic valve area, by VTI measures 0.91 cm. Pulmonic Valve: The pulmonic valve was normal in structure. Pulmonic valve regurgitation is trivial. Aorta: The aortic root and ascending aorta are structurally normal, with no evidence of dilitation. IAS/Shunts: No atrial level shunt detected by color flow Doppler.  LEFT VENTRICLE PLAX 2D LVIDd:         4.80 cm  Diastology LVIDs:         2.78 cm  LV e' medial:    3.81 cm/s LV PW:         1.30 cm  LV E/e' medial:  35.4 LV IVS:        0.78 cm  LV e' lateral:   7.51 cm/s LVOT diam:     1.95 cm  LV E/e' lateral: 18.0 LV SV:         59 LV SV Index:   35 LVOT Area:     2.99 cm  RIGHT VENTRICLE RV Basal diam:  2.83 cm RV S prime:     14.90 cm/s TAPSE (M-mode): 3.7 cm LEFT ATRIUM             Index       RIGHT ATRIUM           Index LA diam:        4.10 cm  2.48 cm/m  RA Area:     11.90 cm LA Vol (A2C):   58.3 ml 35.31 ml/m RA Volume:   24.30 ml  14.72 ml/m LA Vol (A4C):   42.7 ml 25.86 ml/m LA Biplane Vol: 49.8  ml 30.16 ml/m  AORTIC VALVE                    PULMONIC VALVE AV Area (Vmax):    0.97 cm     PV Vmax:        1.13 m/s AV Area (Vmean):   0.86 cm     PV Peak grad:   5.1 mmHg AV Area (VTI):     0.91 cm     RVOT Peak grad: 4 mmHg AV Vmax:           285.67 cm/s AV Vmean:          211.667 cm/s AV VTI:            0.640 m AV Peak Grad:      32.6 mmHg AV Mean Grad:      19.7 mmHg LVOT Vmax:         92.70 cm/s LVOT Vmean:        61.000 cm/s LVOT VTI:          0.196 m LVOT/AV VTI ratio: 0.31 AI PHT:            474 msec  AORTA Ao Root diam: 2.90 cm MITRAL VALVE                TRICUSPID VALVE MV Area (PHT): 3.39 cm     TR Peak grad:   34.1 mmHg MV Decel Time: 224 msec     TR Vmax:        292.00 cm/s MV E velocity: 135.00 cm/s MV A velocity: 114.00 cm/s  SHUNTS MV E/A ratio:  1.18         Systemic VTI:  0.20 m                             Systemic Diam: 1.95 cm Serafina Royals MD Electronically signed by Serafina Royals MD Signature Date/Time: 06/23/2020/9:06:57 AM    Final      Medications:   . sodium chloride     . atorvastatin  40 mg Oral q1800  . carvedilol  6.25 mg Oral BID WC  . clopidogrel  75 mg Oral Daily  . ferrous sulfate  325 mg Oral Once per day on Mon Wed Fri  . furosemide  40 mg Oral Daily  . gabapentin  300 mg Oral BID  . hydrALAZINE  50 mg Oral Q8H  . insulin aspart  0-5 Units Subcutaneous QHS  . insulin aspart  0-9 Units Subcutaneous TID WC  . lidocaine  1 patch Transdermal Q24H  . loratadine  10 mg Oral Daily  . multivitamin  1 tablet Oral Daily  . multivitamin  1 tablet Oral q morning  . NIFEdipine  90 mg Oral Daily  . polycarbophil  1,250 mg Oral BID  . sodium bicarbonate  650 mg Oral BID  . sodium chloride flush  3 mL Intravenous Q12H   sodium chloride, acetaminophen, albuterol, calcium carbonate, labetalol, ondansetron  (ZOFRAN) IV, sodium chloride flush  Assessment/ Plan:  Ms. Misty [name] is a 80 y.o. white female with diabetes mellitus type II, hypertension, TIA, history of rectal cancer who is admitted to [facility] on 06/21/2020 for Acute respiratory failure with hypoxia ([place]) [Z61.09] Acute diastolic CHF (congestive heart failure) (HCC) [I50.31] Dyspnea, unspecified type [R06.00]  1. Acute kidney injury on chronic kidney disease stage V: baseline creatinine of 5.5, GFR of 8 on 06/15/2020.  Creatinine back to  baseline. No acute indication for dialysis Patient wants to initiate dialysis as an outpatient. Patient follows with [facility], Dr. Nicholes Calamity.  Patient to discuss her options with her primary nephrologist. Currently without access.  Outpatient planning is in-center hemodialysis at [facility].  - Discussed AVF placement.  - May resume potassium binder as outpatient.   2. Anemia of chronic kidney disease Lab Results  Component Value Date   HGB 9.4 (L) 06/23/2020  Discussed ESA [place] Sulfate outpatient   3. Secondary Hyperparathyroidism: with outpatient labs: PTH 220, phosphorus 7.0, calcium 9.2 on 05/21/20.   Lab Results  Component Value Date   CALCIUM 9.0 06/23/2020  Calcium carbonate outpatient  4. 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 Home regimen includes Humalog and Lantus. Most recent hemoglobin A1c is 6.0 on 02/23/20.    LOS: 2 Misty [name] 4/16/202212:07 PM

## 2020-06-23 NOTE — Progress Notes (Signed)
PT Cancellation Note  Patient Details Name: Misty [name] MRN: [id number] DOB: 1940/07/18   Cancelled Treatment:    Reason Eval/Treat Not Completed: Medical issues which prohibited therapy.  Pt was chat reviewed and after speaking with nurse, she notes that the pt has high BP that was recently treated with meds.  Pt's most recent BP was 200/51 at 12:00PM, and rechecked by this therapist at 12:30PM and found to be 180/69.  Will re-assess at later time/date as necessary before attempting therapy.   Gwenlyn Saran, PT, DPT 06/23/20, 12:54 PM

## 2020-06-25 LAB — HEMOGLOBIN A1C
Hgb A1c MFr Bld: 6.7 % — ABNORMAL HIGH (ref 4.8–5.6)
Mean Plasma Glucose: 146 mg/dL

## 2020-07-06 ENCOUNTER — Other Ambulatory Visit: Payer: Self-pay

## 2020-07-06 DIAGNOSIS — N184 Chronic kidney disease, stage 4 (severe): Secondary | ICD-10-CM

## 2020-07-06 DIAGNOSIS — D631 Anemia in chronic kidney disease: Secondary | ICD-10-CM

## 2020-07-09 ENCOUNTER — Ambulatory Visit: Payer: Medicare Other | Admitting: Family

## 2020-07-10 ENCOUNTER — Inpatient Hospital Stay: Payer: Medicare Other

## 2020-07-27 DIAGNOSIS — N186 End stage renal disease: Secondary | ICD-10-CM | POA: Insufficient documentation

## 2020-08-08 ENCOUNTER — Encounter: Payer: Self-pay | Admitting: Hematology and Oncology

## 2020-08-09 ENCOUNTER — Ambulatory Visit: Payer: Medicare Other | Admitting: Oncology

## 2020-08-09 ENCOUNTER — Other Ambulatory Visit: Payer: Medicare Other

## 2020-08-09 ENCOUNTER — Ambulatory Visit: Payer: Medicare Other

## 2020-09-24 DIAGNOSIS — R413 Other amnesia: Secondary | ICD-10-CM | POA: Insufficient documentation

## 2020-09-25 ENCOUNTER — Telehealth (INDEPENDENT_AMBULATORY_CARE_PROVIDER_SITE_OTHER): Payer: Self-pay

## 2020-09-25 NOTE — Telephone Encounter (Signed)
Spoke with the patient and she is scheduled with Dr. Lucky Cowboy on 10/01/20 for a permcath removal with a 10:45 am arrival to the [facility]. Pre-procedure instructions were discussed and will be mailed.

## 2020-10-01 ENCOUNTER — Other Ambulatory Visit (INDEPENDENT_AMBULATORY_CARE_PROVIDER_SITE_OTHER): Payer: Self-pay | Admitting: Nurse Practitioner

## 2020-10-01 ENCOUNTER — Encounter
Admission: RE | Disposition: A | Discharge status: Home / Self Care | Payer: Self-pay | Source: Home / Self Care | Attending: Vascular Surgery

## 2020-10-01 ENCOUNTER — Ambulatory Visit
Admission: RE | Admit: 2020-10-01 | Discharge: 2020-10-01 | Disposition: A | Discharge status: Home / Self Care | Payer: Medicare Other | Attending: Vascular Surgery | Admitting: Vascular Surgery

## 2020-10-01 ENCOUNTER — Encounter: Payer: Self-pay | Admitting: Vascular Surgery

## 2020-10-01 DIAGNOSIS — E1122 Type 2 diabetes mellitus with diabetic chronic kidney disease: Secondary | ICD-10-CM | POA: Insufficient documentation

## 2020-10-01 DIAGNOSIS — Z833 Family history of diabetes mellitus: Secondary | ICD-10-CM | POA: Insufficient documentation

## 2020-10-01 DIAGNOSIS — Z880 Allergy status to penicillin: Secondary | ICD-10-CM | POA: Diagnosis not present

## 2020-10-01 DIAGNOSIS — I120 Hypertensive chronic kidney disease with stage 5 chronic kidney disease or end stage renal disease: Secondary | ICD-10-CM | POA: Insufficient documentation

## 2020-10-01 DIAGNOSIS — Z4901 Encounter for fitting and adjustment of extracorporeal dialysis catheter: Secondary | ICD-10-CM | POA: Insufficient documentation

## 2020-10-01 DIAGNOSIS — N186 End stage renal disease: Secondary | ICD-10-CM | POA: Insufficient documentation

## 2020-10-01 HISTORY — PX: DIALYSIS/PERMA CATHETER REMOVAL: CATH118289

## 2020-10-01 SURGERY — DIALYSIS/PERMA CATHETER REMOVAL
Anesthesia: LOCAL

## 2020-10-01 SURGICAL SUPPLY — 2 items
FORCEPS HALSTEAD CVD 5IN STRL (INSTRUMENTS) ×2 IMPLANT
TRAY LACERAT/PLASTIC (MISCELLANEOUS) ×2 IMPLANT

## 2020-10-01 NOTE — Op Note (Signed)
Operative Note     Preoperative diagnosis:   1. ESRD with functional permanent access  Postoperative diagnosis:  1. ESRD with functional permanent access  Procedure:  Removal of right jugular Permcath  Surgeon:  Leotis Pain, MD  Anesthesia:  Local  EBL:  Minimal  Indication for the Procedure:  The patient has a functional permanent dialysis access and no longer needs their permcath.  This can be removed.  Risks and benefits are discussed and informed consent is obtained.  Description of the Procedure:  The patient's right neck, chest and existing catheter were sterilely prepped and draped. The area around the catheter was anesthetized copiously with 1% lidocaine. The catheter was dissected out with curved hemostats until the cuff was freed from the surrounding fibrous sheath. The fiber sheath was transected, and the catheter was then removed in its entirety using gentle traction. Pressure was held and sterile dressings were placed. The patient tolerated the procedure well and was taken to the recovery room in stable condition.     Leotis Pain  10/01/2020, 12:25 PM This note was created with Dragon Medical transcription system. Any errors in dictation are purely unintentional.

## 2020-10-01 NOTE — H&P (Signed)
[facility] Admission History & Physical  MRN : [id number]  Misty [name] is a 80 y.o. (28-Aug-1940) female who presents with chief complaint of No chief complaint on file. Marland Kitchen  History of Present Illness: I am asked to evaluate the patient by the dialysis center. The patient was sent here because they have a nonfunctioning tunneled catheter and a functioning PD catheter.  The patient reports they're not been any problems with any of their dialysis runs. They are reporting good flows with good parameters at dialysis.   Patient denies pain or tenderness overlying the access.  There is no pain with dialysis.  The patient denies hand pain or finger pain consistent with steal syndrome.  No fevers or chills while on dialysis.    No current facility-administered medications for this encounter.    Past Medical History:  Diagnosis Date   Anemia    Cancer ([place]) 2011   rectal ca/chemo /rad /surgery   Chronic kidney disease    Diabetes mellitus without complication ([place])    type 2   Hypertension    Personal history of chemotherapy    Personal history of radiation therapy    TIA (transient ischemic attack)     Past Surgical History:  Procedure Laterality Date   BREAST BIOPSY Left    bx/ clip-neg   COLONOSCOPY N/A 05/03/2020   Procedure: COLONOSCOPY;  Surgeon: Lesly Rubenstein, MD;  Location: [facility] ENDOSCOPY;  Service: Endoscopy;  Laterality: N/A;   coloretcal cancer     thyroid gland     surgery / unknown date     Social History   Tobacco Use   Smoking status: Never   Smokeless tobacco: Never  Vaping Use   Vaping Use: Never used  Substance Use Topics   Alcohol use: Never   Drug use: Never    Family History  Problem Relation Age of Onset   Lung disease Sister    Diabetes Mellitus II Paternal Grandmother    Breast cancer Neg Hx     No family history of bleeding or clotting disorders, autoimmune disease or porphyria  Allergies  Allergen  Reactions   Succinylcholine Other (See Comments)    Severe myalgia's   Penicillins Rash     REVIEW OF SYSTEMS (Negative unless checked)  Constitutional: [] Weight loss  [] Fever  [] Chills Cardiac: [] Chest pain   [] Chest pressure   [] Palpitations   [] Shortness of breath when laying flat   [] Shortness of breath at rest   [x] Shortness of breath with exertion. Vascular:  [] Pain in legs with walking   [] Pain in legs at rest   [] Pain in legs when laying flat   [] Claudication   [] Pain in feet when walking  [] Pain in feet at rest  [] Pain in feet when laying flat   [] History of DVT   [] Phlebitis   [] Swelling in legs   [] Varicose veins   [] Non-healing ulcers Pulmonary:   [] Uses home oxygen   [] Productive cough   [] Hemoptysis   [] Wheeze  [] COPD   [] Asthma Neurologic:  [] Dizziness  [] Blackouts   [] Seizures   [] History of stroke   [x] History of TIA  [] Aphasia   [] Temporary blindness   [] Dysphagia   [] Weakness or numbness in arms   [] Weakness or numbness in legs Musculoskeletal:  [] Arthritis   [] Joint swelling   [] Joint pain   [] Low back pain Hematologic:  [] Easy bruising  [] Easy bleeding   [] Hypercoagulable state   [x] Anemic  [] Hepatitis Gastrointestinal:  [] Blood in stool   [] Vomiting  blood  [] Gastroesophageal reflux/heartburn   [] Difficulty swallowing. Genitourinary:  [x] Chronic kidney disease   [] Difficult urination  [] Frequent urination  [] Burning with urination   [] Blood in urine Skin:  [] Rashes   [] Ulcers   [] Wounds Psychological:  [] History of anxiety   []  History of major depression.  Physical Examination  Vitals:   10/01/20 1100 10/01/20 1101  BP:  (!) 161/50  Pulse: 72   Resp: 15   Temp: 98.2 F (36.8 C)   TempSrc: Oral   SpO2: 98%    There is no height or weight on file to calculate BMI. Gen: WD/WN, NAD Head: [place]/AT, No temporalis wasting. Ear/Nose/Throat: Hearing grossly intact, nares w/o erythema or drainage, oropharynx w/o Erythema/Exudate,  Eyes: Conjunctiva clear, sclera  non-icteric Neck: Trachea midline.  No JVD.  Pulmonary:  Good air movement, respirations not labored, no use of accessory muscles.  Cardiac: RRR, normal S1, S2. Vascular: right jugular permcath without erythema or drainage Vessel Right Left  Radial Palpable Palpable   Musculoskeletal: M/S 5/5 throughout.  Extremities without ischemic changes.  No deformity or atrophy.  Neurologic: Sensation grossly intact in extremities.  Symmetrical.  Speech is fluent. Motor exam as listed above. Psychiatric: Judgment intact, Mood & affect appropriate for pt's clinical situation. Dermatologic: No rashes or ulcers noted.  No cellulitis or open wounds.    CBC Lab Results  Component Value Date   WBC 11.5 (H) 06/23/2020   HGB 9.4 (L) 06/23/2020   HCT 29.0 (L) 06/23/2020   MCV 87.1 06/23/2020   PLT 274 06/23/2020    BMET    Component Value Date/Time   NA 141 06/23/2020 0410   K 3.7 06/23/2020 0410   CL 107 06/23/2020 0410   CO2 24 06/23/2020 0410   GLUCOSE 153 (H) 06/23/2020 0410   BUN 65 (H) 06/23/2020 0410   CREATININE 5.67 (H) 06/23/2020 0410   CALCIUM 9.0 06/23/2020 0410   GFRNONAA 7 (L) 06/23/2020 0410   GFRAA 13 (L) 08/11/2019 1405   CrCl cannot be calculated (Patient's most recent lab result is older than the maximum 21 days allowed.).  COAG Lab Results  Component Value Date   INR 1.0 06/21/2020   INR 1.0 05/02/2020    Radiology PERIPHERAL VASCULAR CATHETERIZATION  Result Date: 10/01/2020 See surgical note for result.   Assessment/Plan 1.  Complication dialysis device:  Patient's right jugular Tunneled catheter is not being used. The patient has a PD catheter access that is functioning well. Therefore, the patient will undergo removal of the tunneled catheter under local anesthesia.  The risks and benefits were described to the patient.  All questions were answered.  The patient agrees to proceed with angiography and intervention. Potassium will be drawn to ensure that it is  an appropriate level prior to performing intervention. 2.  End-stage renal disease requiring hemodialysis:  Patient will continue dialysis therapy without further interruption if a successful intervention is not achieved then a tunneled catheter will be placed. Dialysis has already been arranged. 3.  Hypertension:  Patient will continue medical management; nephrology is following no changes in oral medications. 4. Diabetes mellitus:  Glucose will be monitored and oral medications been held this morning once the patient has undergone the patient's procedure po intake will be reinitiated and again Accu-Cheks will be used to assess the blood glucose level and treat as needed. The patient will be restarted on the patient's usual hypoglycemic regime     Leotis Pain, MD  10/01/2020 12:26 PM

## 2020-11-08 ENCOUNTER — Other Ambulatory Visit: Payer: Self-pay | Admitting: Physical Medicine & Rehabilitation

## 2020-11-08 DIAGNOSIS — M48062 Spinal stenosis, lumbar region with neurogenic claudication: Secondary | ICD-10-CM

## 2020-11-08 DIAGNOSIS — G8929 Other chronic pain: Secondary | ICD-10-CM

## 2020-12-11 ENCOUNTER — Ambulatory Visit: Payer: Medicare Other

## 2020-12-18 ENCOUNTER — Ambulatory Visit
Admission: RE | Admit: 2020-12-18 | Discharge: 2020-12-18 | Disposition: A | Discharge status: Home / Self Care | Payer: Medicare Other | Source: Ambulatory Visit | Attending: Physical Medicine & Rehabilitation | Admitting: Physical Medicine & Rehabilitation

## 2020-12-18 ENCOUNTER — Other Ambulatory Visit: Payer: Self-pay

## 2020-12-18 DIAGNOSIS — G8929 Other chronic pain: Secondary | ICD-10-CM

## 2020-12-18 DIAGNOSIS — M48062 Spinal stenosis, lumbar region with neurogenic claudication: Secondary | ICD-10-CM | POA: Insufficient documentation

## 2020-12-18 DIAGNOSIS — M5441 Lumbago with sciatica, right side: Secondary | ICD-10-CM | POA: Insufficient documentation

## 2020-12-18 IMAGING — MR MR LUMBAR SPINE W/O CM
4 of 5 series · 25 of 48 positions shown · non-contrast
Comparison: [DATE].

CLINICAL DATA: Lumbar stenosis with neurogenic claudication [6P]
([6P]-CM)

Chronic right-sided low back pain with right-sided sciatica M54.41,
[6P] ([6P]-CM)
EXAM:
MRI LUMBAR SPINE WITHOUT CONTRAST
TECHNIQUE: Multiplanar, multisequence MR imaging of the lumbar spine was
performed. No intravenous contrast was administered.

[Series 4: T2 · sagittal · 4.0mm · 0.81mm/px · 6 of 17 slices shown (1 of 2)]
[im 1/17]
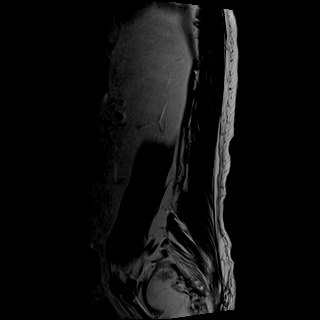
[im 4/17]
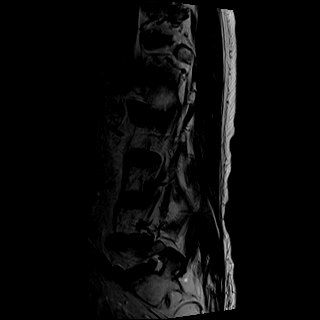
[im 7/17]
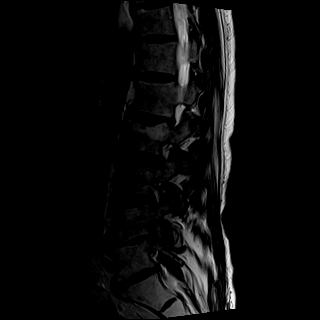
[im 10/17]
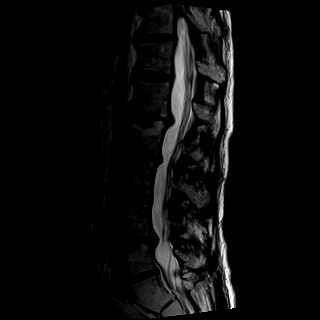
[im 13/17]
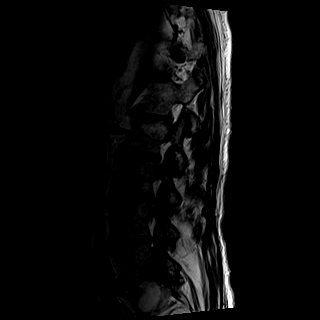
[im 17/17]
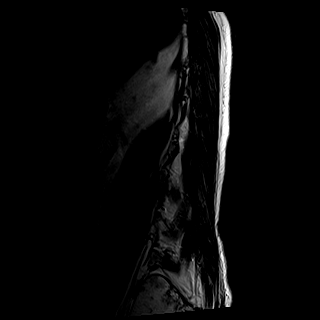

[Series 5: T1 · sagittal · 4.0mm · 0.41mm/px · 6 of 17 slices shown (1 of 2)]
[im 1/17]
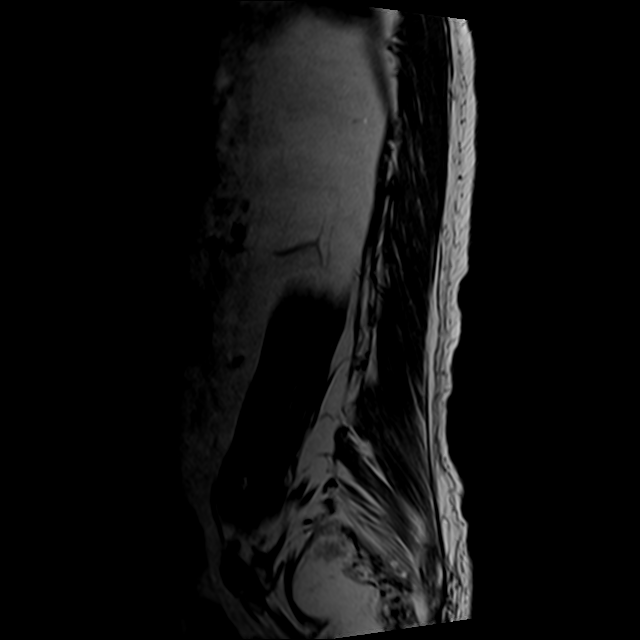
[im 4/17]
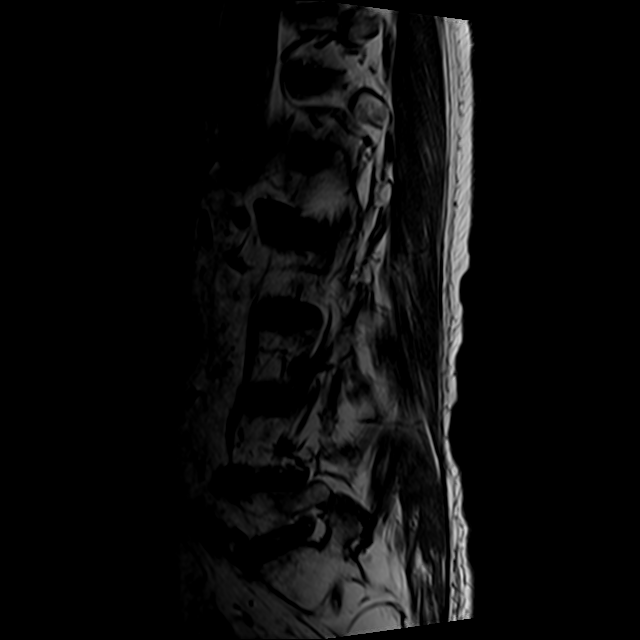
[im 7/17]
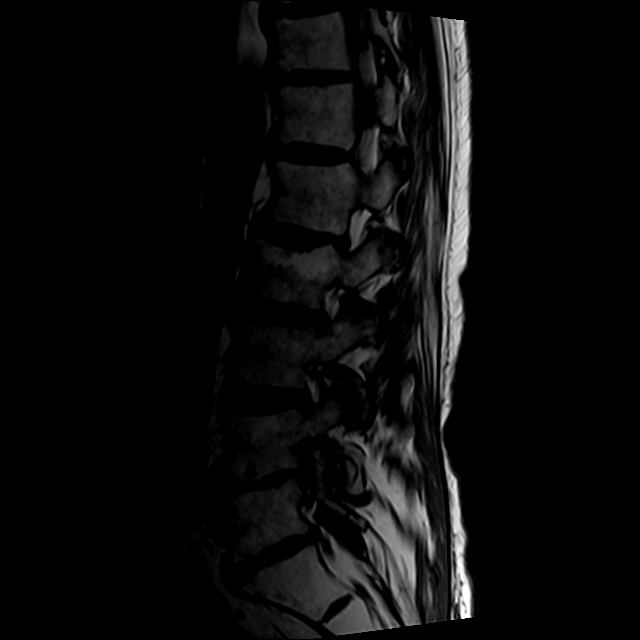
[im 10/17]
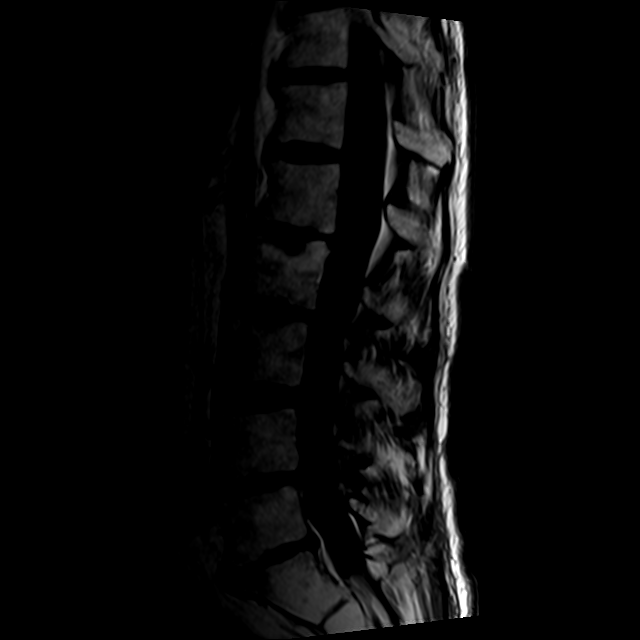
[im 13/17]
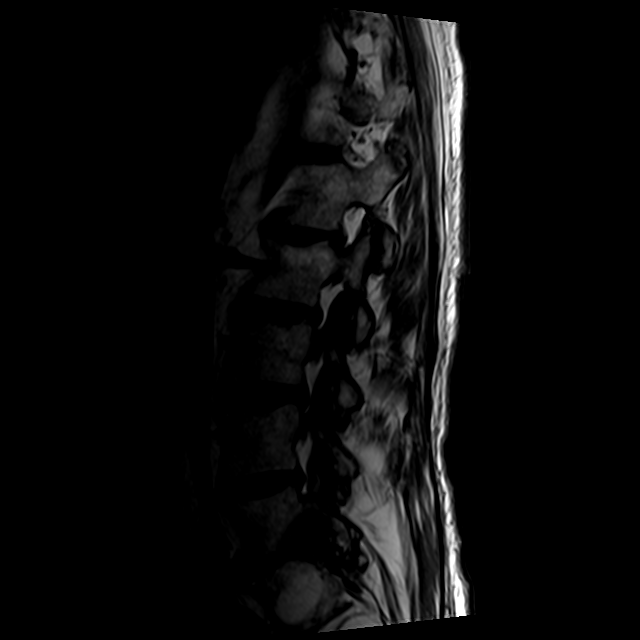
[im 17/17]
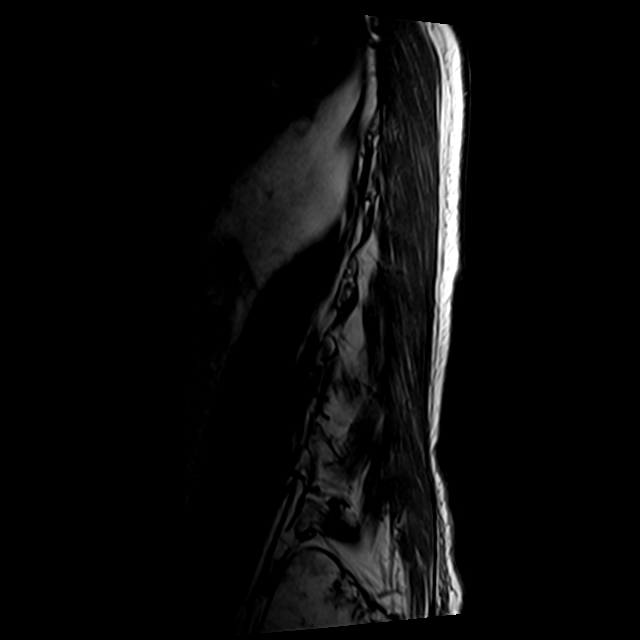

[Series 7: T2 · axial · 4.0mm · 0.78mm/px · z∈[-128,+92]mm · 9 of 39 slices shown (2 of 2)]
[im 1/39]
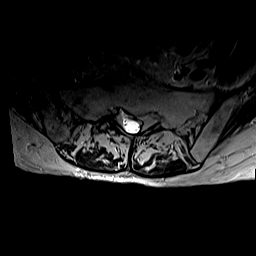
[im 6/39]
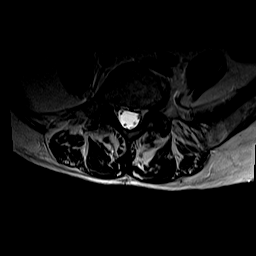
[im 11/39]
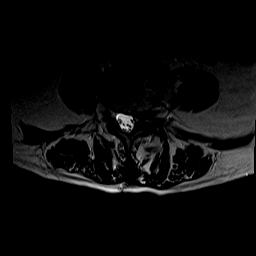
[im 17/39]
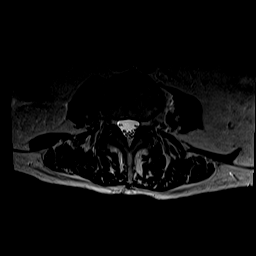
[im 20/39]
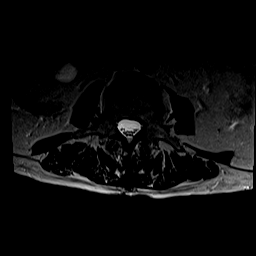
[im 22/39]
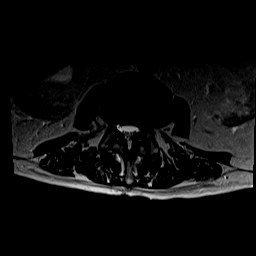
[im 28/39]
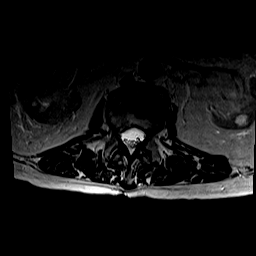
[im 33/39]
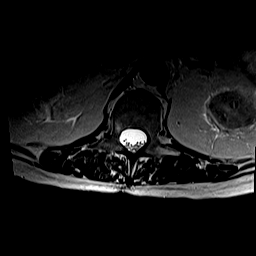
[im 39/39]
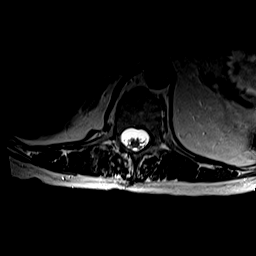

[Series 8: T1 · axial · 4.0mm · 0.39mm/px · z∈[-128,+62]mm · 4 of 39 slices shown (2 of 2)]
[im 1/39]
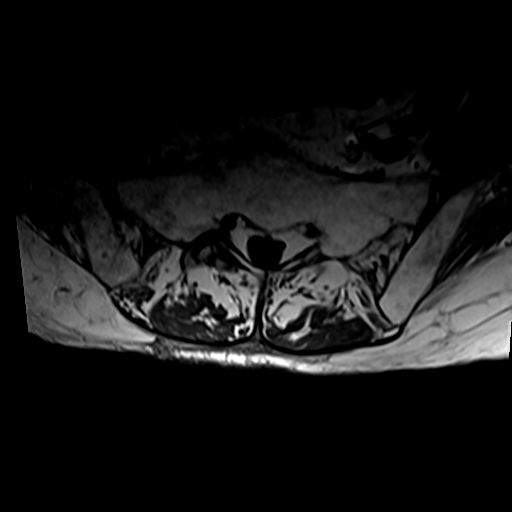
[im 6/39]
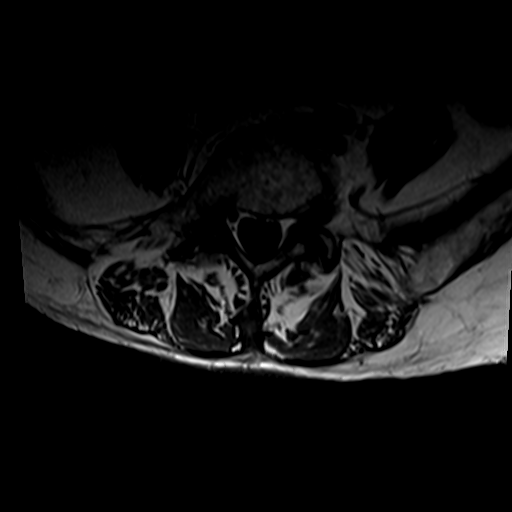
[im 20/39]
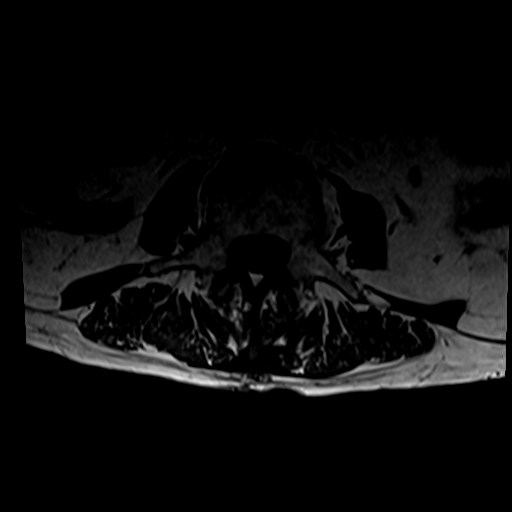
[im 33/39]
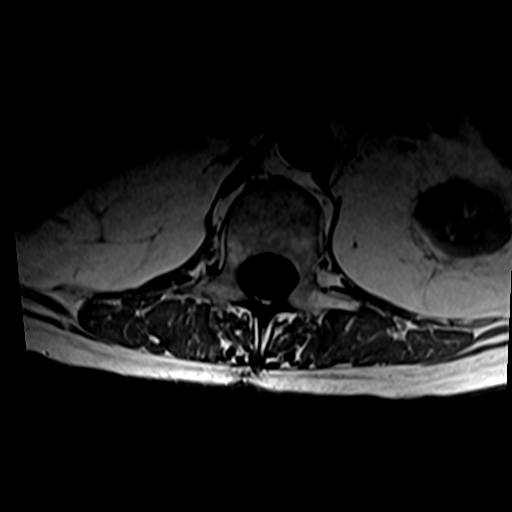

[25 of 48 positions shown; findings below may reference images not displayed]

FINDINGS: Segmentation: 5 lumbar type vertebral bodies. Same numbering system
as on the prior MRI.

Alignment:  Similar alignment.  Mild dextrocurvature.

Vertebrae: Unchanged remote L2 superior endplate fracture with
superimposed Schmorl's node. No marrow edema.

Conus medullaris and cauda equina: Conus extends to the T12-L1
level. Conus appears normal.

Paraspinal and other soft tissues: Bilateral renal cyst with
bilateral renal cortical atrophy/thinning, chronic.

Disc levels:

T12-L1: Slight disc bulging without significant stenosis.

L1-L2: Similar disc bulging with mild right subarticular recess
narrowing. No significant canal or foraminal stenosis.

L2-L3: Slightly left eccentric broad disc bulge with bilateral facet
hypertrophy and ligamentum flavum thickening. Similar resulting mild
canal and left subarticular recess stenosis. No significant
foraminal stenosis.

L3-L4: Mild disc bulge and bilateral facet arthropathy with mild
bilateral foraminal stenosis, similar. No significant canal
stenosis.

L4-L5: Bilateral facet arthropathy. Left eccentric disc height loss.
Similar versus slightly progressed asymmetric left-sided disc
protrusion with left paracentral and foraminal extension. Similar
left L4 and L5 nerve root compression at the subarticular recess and
foramen.

L5-S1: Right greater than left facet arthropathy. Disc bulge with
superimposed right larger than left foraminal disc protrusions.
Similar resulting severe right foraminal stenosis with moderate
right subarticular recess stenosis. No significant canal stenosis.
Moderate left foraminal stenosis.
IMPRESSION: 1. At L5-S1, similar severe right foraminal stenosis with
impingement. Similar moderate right subarticular recess and left
foraminal stenosis.
2. At L4-L5, similar versus slightly progressed left
paracentral/foraminal disc protrusion with similar left L4 and L5
nerve root impingement in the subarticular recess and foramen.

## 2020-12-31 ENCOUNTER — Other Ambulatory Visit: Payer: Self-pay | Admitting: Neurosurgery

## 2021-01-14 ENCOUNTER — Other Ambulatory Visit: Payer: Self-pay

## 2021-01-14 ENCOUNTER — Other Ambulatory Visit
Admission: RE | Admit: 2021-01-14 | Discharge: 2021-01-14 | Disposition: A | Discharge status: Home / Self Care | Payer: Medicare Other | Source: Ambulatory Visit | Attending: Neurosurgery | Admitting: Neurosurgery

## 2021-01-14 DIAGNOSIS — Z01818 Encounter for other preprocedural examination: Secondary | ICD-10-CM | POA: Diagnosis not present

## 2021-01-14 DIAGNOSIS — I714 Abdominal aortic aneurysm, without rupture, unspecified: Secondary | ICD-10-CM | POA: Insufficient documentation

## 2021-01-14 DIAGNOSIS — I10 Essential (primary) hypertension: Secondary | ICD-10-CM | POA: Insufficient documentation

## 2021-01-14 DIAGNOSIS — Z8673 Personal history of transient ischemic attack (TIA), and cerebral infarction without residual deficits: Secondary | ICD-10-CM | POA: Diagnosis not present

## 2021-01-14 HISTORY — DX: Aortic aneurysm of unspecified site, without rupture: I71.9

## 2021-01-14 HISTORY — DX: Nonrheumatic aortic (valve) stenosis: I35.0

## 2021-01-14 HISTORY — DX: Dependence on renal dialysis: Z99.2

## 2021-01-14 HISTORY — DX: Gastro-esophageal reflux disease without esophagitis: K21.9

## 2021-01-14 HISTORY — DX: Cardiac murmur, unspecified: R01.1

## 2021-01-14 LAB — SURGICAL PCR SCREEN
MRSA, PCR: NEGATIVE
Staphylococcus aureus: NEGATIVE

## 2021-01-14 LAB — URINALYSIS, ROUTINE W REFLEX MICROSCOPIC
Bilirubin Urine: NEGATIVE
Glucose, UA: 50 mg/dL — AB
Ketones, ur: NEGATIVE mg/dL
Nitrite: NEGATIVE
Protein, ur: >=300 mg/dL — AB
RBC / HPF: >50 RBC/hpf — ABNORMAL HIGH (ref 0–5)
Specific Gravity, Urine: 1.01 (ref 1.005–1.030)
pH: 6 (ref 5.0–8.0)

## 2021-01-14 LAB — APTT: aPTT: 31 seconds (ref 24–36)

## 2021-01-14 LAB — CBC
HCT: 33.7 % — ABNORMAL LOW (ref 36.0–46.0)
Hemoglobin: 10.7 g/dL — ABNORMAL LOW (ref 12.0–15.0)
MCH: 30.4 pg (ref 26.0–34.0)
MCHC: 31.8 g/dL (ref 30.0–36.0)
MCV: 95.7 fL (ref 80.0–100.0)
Platelets: 271 10*3/uL (ref 150–400)
RBC: 3.52 MIL/uL — ABNORMAL LOW (ref 3.87–5.11)
RDW: 15 % (ref 11.5–15.5)
WBC: 7.9 10*3/uL (ref 4.0–10.5)
nRBC: 0 % (ref 0.0–0.2)

## 2021-01-14 LAB — TYPE AND SCREEN
ABO/RH(D): A NEG
Antibody Screen: NEGATIVE

## 2021-01-14 LAB — PROTIME-INR
INR: 0.9 (ref 0.8–1.2)
Prothrombin Time: 12.2 seconds (ref 11.4–15.2)

## 2021-01-14 NOTE — Patient Instructions (Addendum)
Your procedure is scheduled on:01-23-21 Wednesday Report to the Registration Desk on the 1st floor of the [facility].Then proceed to the 2nd floor Surgery Desk in the [facility] To find out your arrival time, please call [phone] between 1PM - 3PM on:01-22-21 Tuesday  REMEMBER: Instructions that are not followed completely may result in serious medical risk, up to and including death; or upon the discretion of your surgeon and anesthesiologist your surgery may need to be rescheduled.  Do not eat food after midnight the night before surgery.  No gum chewing, lozengers or hard candies.  You may however, drink Water up to 2 hours before you are scheduled to arrive for your surgery. Do not drink anything within 2 hours of your scheduled arrival time.  Type 1 and Type 2 diabetics should only drink water.  TAKE THESE MEDICATIONS THE MORNING OF SURGERY WITH A SIP OF WATER: -carvedilol (COREG) 12.5 MG tablet -cetirizine (ZYRTEC) 10 MG tablet -colestipol (COLESTID) 1 g tablet -hydrALAZINE (APRESOLINE) 50 MG tablet -gabapentin (NEURONTIN) 300 MG capsule -NIFEdipine (PROCARDIA XL/NIFEDICAL-XL) 90 MG 24 hr tablet -famotidine (PEPCID) 20 MG tablet-take one the night before and another one the morning of surgery  Stop your aspirin EC 81 MG tablet 7 days prior to surgery as instructed by Dr Julianne Handler dose on 01-15-21 Tuesday  Take half of your insulin glargine (LANTUS) 100 UNIT/ML injection (10 units) the night before surgery and NO insulin the morning of surgery  One week prior to surgery: Stop Anti-inflammatories (NSAIDS) such as Advil, Aleve, Ibuprofen, Motrin, Naproxen, Naprosyn and Aspirin based products such as Excedrin, Goodys Powder, BC Powder.You may however, continue to take Tylenol/Tramadol if needed for pain up until the day of surgery.  Stop ANY OVER THE COUNTER supplements/vitamins 7 days prior to surgery (Cholecalciferol (VITAMIN D) 50 MCG (2000 UT) tablet, Fiber Diet  TABS, multivitamin (RENA-VIT) TABS tablet)  No Alcohol for 24 hours before or after surgery.  No Smoking including e-cigarettes for 24 hours prior to surgery.  No chewable tobacco products for at least 6 hours prior to surgery.  No nicotine patches on the day of surgery.  Do not use any "recreational" drugs for at least a week prior to your surgery.  Please be advised that the combination of cocaine and anesthesia may have negative outcomes, up to and including death. If you test positive for cocaine, your surgery will be cancelled.  On the morning of surgery brush your teeth with toothpaste and water, you may rinse your mouth with mouthwash if you wish. Do not swallow any toothpaste or mouthwash.  Use CHG Soap as directed on instruction sheet.  Do not wear jewelry, make-up, hairpins, clips or nail polish.  Do not wear lotions, powders, or perfumes.   Do not shave body from the neck down 48 hours prior to surgery just in case you cut yourself which could leave a site for infection.  Also, freshly shaved skin may become irritated if using the CHG soap.  Contact lenses, hearing aids and dentures may not be worn into surgery.  Do not bring valuables to the hospital. [facility] is not responsible for any missing/lost belongings or valuables.   Notify your doctor if there is any change in your medical condition (cold, fever, infection).  Wear comfortable clothing (specific to your surgery type) to the hospital.  After surgery, you can help prevent lung complications by doing breathing exercises.  Take deep breaths and cough every 1-2 hours. Your doctor may order a device  called an Chiropodist to help you take deep breaths. When coughing or sneezing, hold a pillow firmly against your incision with both hands. This is called "splinting." Doing this helps protect your incision. It also decreases belly discomfort.  If you are being admitted to the hospital overnight, leave your  suitcase in the car. After surgery it may be brought to your room.  If you are being discharged the day of surgery, you will not be allowed to drive home. You will need a responsible adult (18 years or older) to drive you home and stay with you that night.   If you are taking public transportation, you will need to have a responsible adult (18 years or older) with you. Please confirm with your physician that it is acceptable to use public transportation.   Please call the [facility] Dept. at [phone] if you have any questions about these instructions.  Surgery Visitation Policy:  Patients undergoing a surgery or procedure may have one family member or support person with them as long as that person is not COVID-19 positive or experiencing its symptoms.  That person may remain in the waiting area during the procedure and may rotate out with other people.  Inpatient Visitation:    Visiting hours are 7 a.m. to 8 p.m. Up to two visitors ages 16+ are allowed at one time in a patient room. The visitors may rotate out with other people during the day. Visitors must check out when they leave, or other visitors will not be allowed. One designated support person may remain overnight. The visitor must pass COVID-19 screenings, use hand sanitizer when entering and exiting the patient's room and wear a mask at all times, including in the patient's room. Patients must also wear a mask when staff or their visitor are in the room. Masking is required regardless of vaccination status.

## 2021-01-15 NOTE — Progress Notes (Signed)
  [facility] Perioperative Services: Pre-Admission/Anesthesia Testing  Abnormal Lab Notification   Date: 01/15/21  Name: Misty [name] MRN:   [id number]  Re: Abnormal labs noted during PAT appointment   Notified:  Provider Name Provider Role Notification Mode  Meade Maw, MD Neurosurgery Routed and/or faxed via [facility] and Notes:  ABNORMAL LAB VALUE(S): Lab Results  Component Value Date   NA 125 (L) 01/14/2021   K 3.3 (L) 01/14/2021   CO2 24 01/14/2021   GLUCOSE 158 (H) 01/14/2021   BUN 32 (H) 01/14/2021   CREATININE 5.39 (H) 01/14/2021   CALCIUM 8.4 (L) 01/14/2021   GFRNONAA 8 (L) 01/14/2021   Lab Results  Component Value Date   COLORURINE YELLOW (A) 01/14/2021   APPEARANCEUR HAZY (A) 01/14/2021   LABSPEC 1.010 01/14/2021   PHURINE 6.0 01/14/2021   GLUCOSEU 50 (A) 01/14/2021   HGBUR MODERATE (A) 01/14/2021   BILIRUBINUR NEGATIVE 01/14/2021   KETONESUR NEGATIVE 01/14/2021   PROTEINUR >=300 (A) 01/14/2021   NITRITE NEGATIVE 01/14/2021   LEUKOCYTESUR SMALL (A) 01/14/2021   EPIU 0-5 01/14/2021   WBCU 21-50 01/14/2021   RBCU >50 (H) 01/14/2021   BACTERIA RARE (A) 01/14/2021   Clinical Notes:  Misty [name] is scheduled for a RIGHT L5-S1 FAR LATERAL DISCECTOMY; RIGHT L5-S1 DECOMPRESSION on 01/23/2021. The above lab abnormalities have been identified. Of note, patient is on peritoneal dialysis; last treatment was on Saturday (01/12/2021). Sending results to surgeon for review and follow up.   Regarding her urine. Questionable infection present. Urine C&S has been sent. If (+) for pathogenically significant infection, I will contact patient to discuss and treat based on sensitivity report. Follow up note will be sent to Dr. Rhea Bleacher team to make them aware of plans should treatment with ABX be required.   Honor Loh, MSN, APRN, FNP-C, CEN [facility]  Peri-operative Services Nurse  Practitioner Phone: [phone] Fax: [phone] 01/15/21 12:13 PM

## 2021-01-16 LAB — BASIC METABOLIC PANEL
Anion gap: 17 — ABNORMAL HIGH (ref 5–15)
BUN: 32 mg/dL — ABNORMAL HIGH (ref 8–23)
CO2: 24 mmol/L (ref 22–32)
Calcium: 8.4 mg/dL — ABNORMAL LOW (ref 8.9–10.3)
Chloride: 98 mmol/L (ref 98–111)
Creatinine, Ser: 5.39 mg/dL — ABNORMAL HIGH (ref 0.44–1.00)
GFR, Estimated: 8 mL/min — ABNORMAL LOW (ref >60)
Glucose, Bld: 158 mg/dL — ABNORMAL HIGH (ref 70–99)
Potassium: 3.3 mmol/L — ABNORMAL LOW (ref 3.5–5.1)
Sodium: 139 mmol/L (ref 135–145)

## 2021-01-17 ENCOUNTER — Encounter: Payer: Self-pay | Admitting: Neurosurgery

## 2021-01-17 LAB — URINE CULTURE: Culture: 10000 — AB

## 2021-01-17 NOTE — Progress Notes (Signed)
Perioperative Services  Pre-Admission/Anesthesia Testing Clinical Review  Date: 01/17/21  Patient Demographics:  Name: Misty [name] DOB:   Mar 20, 1940 MRN:   [id number]  Planned Surgical Procedure(s):    Case: [id number] Date/Time: 01/23/21 0815   Procedures:      RIGHT L5-S1 FAR LATERAL DISCECTOMY (Right)     RIGHT L5-S1 DECOMPRESSION (Right)   Anesthesia type: General   Pre-op diagnosis:      Neurogenic claudication due to lumbar spinal stenosis M48.062     Lumbar radiculopathy M54.16   Location: [facility] OR ROOM 03 / [place] [facility]   Surgeons: Meade Maw, MD   NOTE: Available PAT nursing documentation and vital signs have been reviewed. Clinical nursing staff has updated patient's PMH/PSHx, current medication list, and drug allergies/intolerances to ensure comprehensive history available to assist in medical decision making as it pertains to the aforementioned surgical procedure and anticipated anesthetic course. Extensive review of available clinical information performed. [place] [facility] and PSHx updated with any diagnoses/procedures that  may have been inadvertently omitted during her intake with the pre-admission testing department's nursing staff.  Clinical Discussion:  Misty [name] is a 80 y.o. female who is submitted for pre-surgical anesthesia review and clearance prior to her undergoing the above procedure. Patient has never been a smoker. Pertinent PMH includes: aortic valve stenosis, diastolic dysfunction, thoracic aortic dilatation, TIA, HTN, HLD, T2DM, hyperparathyroidism (s/p total parathyroidectomy), ESRD on peritoneal dialysis, GERD (on H2 blocker), anemia of chronic disease, MGUS, rectal cancer (s/p resection + chemoradiation), OA, spinal stenosis, motion sickness.  Patient is followed by cardiology Nehemiah Massed, MD). She was last seen in the cardiology clinic on 12/27/2020; notes reviewed.  At the time of her clinic visit, patient doing well  overall from a cardiovascular perspective.  She denied any episodes of chest pain, significant shortness of breath, PND, orthopnea, palpitations, significant peripheral edema, vertiginous symptoms, or presyncope/syncope.  PMH significant for cardiovascular diagnoses.  TTE performed on 07/02/2017 revealed normal left ventricular function with an EF of 55 to 60%.  Diastolic Doppler parameters consistent with impaired relaxation (G1DD).  There was mild aortic valve regurgitation.  Additionally there was mild aortic stenosis with a mean transvalvular gradient of 13.0 mmHg.  TTE performed on 10/14/2019 revealed a normal left ventricular systolic function with an EF of >55%.  There was mild atrial and mitral valve regurgitation, in addition to trivial pulmonic and tricuspid valve regurgitation.  Aortic stenosis moderate with a mean transvalvular gradient of 23.0 mmHg.  Additionally, there was dilatation of the ascending thoracic aorta measuring up to 3.7 cm.  Cardiac function has been monitored serially with transthoracic echocardiograms.  Patient's last study was performed on 11/27/2020 revealing a normal left trickle systolic function with an EF of 55%.  There was mild aortic, mitral, and pulmonic valve regurgitation, in addition to trivial tricuspid valve regurgitation.  Aortic stenosis moderate with a mean transvalvular pressure gradient of 18.0 mmHg.  Ascending aorta dilatation remained stable at 3.7 cm.  Blood pressure elevated at 146/68 on currently prescribed beta-blocker, diuretic, vasodilator, and CCB therapies.  Patient formerly on statin therapy for HLD, however she was transitioned to a bile acid sequestrant (colestipol).  T2DM well controlled on currently prescribed regimen; last Hgb A1c was 6.0% when checked on 09/24/2020.  Patient with an ESRD diagnosis and currently on peritoneal dialysis; compliant with prescribed therapeutic regimen. Functional capacity, as defined by DASI, is documented as  being >/= 4 METS.  No changes were made to her medication regimen.  Patient  to follow-up with outpatient cardiology in 2 months or sooner if needed  Misty [name] is scheduled for an RIGHT L5-S1 FAR LATERAL DECOMPRESSION AND DISCECTOMY on 01/23/2021 with Dr. Meade Maw, MD.  Given patient's past medical history significant for cardiovascular diagnoses, presurgical cardiac clearance was sought by the PAT team. Per cardiology, "this patient is optimized for surgery and may proceed with the planned procedural course with a ACCEPTABLE risk of significant perioperative cardiovascular complications". This patient is on daily antiplatelet therapy. She has been instructed on recommendations for holding her daily low-dose ASA for 7 days prior to her procedure with plans to restart as soon as postoperative bleeding risk felt to be minimized by her attending surgeon. The patient has been instructed that her last dose of her anticoagulant will be on 01/15/2021.  Patient reports previous perioperative complications with anesthesia in the past.  She has experienced (+) severe myalgias in the past when she has received succinylcholine.  Additionally, patient deemed to have a (+) difficult airway during last procedure. In review of the available records, it is noted that patient underwent a general anesthetic course at [facility] (ASA IV) in 08/2020, at which time anesthesiology encounter difficulties intubating the patient. Initial attempt with Sabra Heck 3, larynx was "rigid/fixed", thus unable to visualize anything but epiglottis. Glidescope utilized to obtain a great view with VL. First attempt with Glidescope unsuccessful due to anterior orientation. ETT was withdrawn and a greater bend was placed. Second attempt was successful with effort. Patient sustained lip laceration during intubation attempts.  Vitals with BMI 01/14/2021 12/18/2020 10/01/2020  Height 5\' 3"  - -  Weight 121 lbs  11 oz 137 lbs -  BMI 95.09 - -  Systolic 326 - 712  Diastolic 50 - 61  Pulse 65 - 70    Providers/Specialists:   NOTE: Primary physician provider listed below. Patient may have been seen by APP or partner within same practice.   PROVIDER ROLE / SPECIALTY LAST Dola Factor, MD NEUROSURGERY 12/28/2020  Gayland Curry, MD PRIMARY CARE PROVIDER 09/24/2020  Serafina Royals, MD CARDIOLOGY 12/27/2020  Demetrius Revel, MD NEPHROLOGY 08/24/2020   Allergies:  Succinylcholine and Penicillins  Current Home Medications:   No current facility-administered medications for this encounter.    acetaminophen (TYLENOL) 325 MG tablet   aspirin EC 81 MG tablet   carvedilol (COREG) 12.5 MG tablet   cetirizine (ZYRTEC) 10 MG tablet   Cholecalciferol (VITAMIN D) 50 MCG (2000 UT) tablet   colestipol (COLESTID) 1 g tablet   Fiber Diet TABS   furosemide (LASIX) 40 MG tablet   gabapentin (NEURONTIN) 300 MG capsule   HUMALOG 100 UNIT/ML injection   hydrALAZINE (APRESOLINE) 50 MG tablet   insulin glargine (LANTUS) 100 UNIT/ML injection   lidocaine (LIDODERM) 5 %   multivitamin (RENA-VIT) TABS tablet   NIFEdipine (PROCARDIA XL/NIFEDICAL-XL) 90 MG 24 hr tablet   sevelamer carbonate (RENVELA) 800 MG tablet   traMADol (ULTRAM) 50 MG tablet   carvedilol (COREG) 6.25 MG tablet   famotidine (PEPCID) 20 MG tablet   glucose blood test strip   INSULIN SYRINGE 1CC/29G 29G X 1/2" 1 ML MISC   SURE COMFORT PEN NEEDLES 31G X 8 MM MISC   History:   Past Medical History:  Diagnosis Date   Anemia of chronic renal failure    Aortic valve stenosis 07/02/2017   a.) TTE 07/02/2017: 55-60%; mild AS with MPG 13.0 mmHg. b.) TTE 10/14/2019: EF >55%; MPG 23.0 mmHg. c.) TTE  06/23/2020: EF 50-55%; MPG 19.7 mmHg. d.) TTE 11/27/2020: EF >55%; MPG 18.0 mmHg.   Chronic lower back pain    Complication of anesthesia    a.) severe myalgias with succinylcholine use. b.) (+) difficult airway.   Diastolic dysfunction  91/47/8295   a.) TTE 07/02/2017; EF 55-60%; mild AR; G1DD. b.) TTE 06/23/2020: EF 50-55% with mild LVH; mild to mod LA enlargement; mild AR, mild to mod MR; G2DD.   Difficult airway for intubation    a.) 08/08/2020 at [facility] --> Initial attempt with Sabra Heck 3, larynx was "rigid/fixed"; unable to visualize anything but epiglottis. Glidescope utilized to obtain a great view with VL. First attempt with Glidescope unsuccessful d/t anterior orientation. ETT withdrawn and a greater bend was placed. Second attempt was successful with effort. Patient sustained lip laceration during intubation attempts.   Dilatation of thoracic aorta ([place]) 11/03/2019   a.) TTE 11/03/2019: measured 3.7 cm   ESRD on peritoneal dialysis ([place])    GERD (gastroesophageal reflux disease)    Glaucoma    Heart murmur    HLD (hyperlipidemia)    Hyperparathyroidism ([place])    a.) s/p total parathyroidectomy   Hypertension    Hypertensive retinopathy    Lumbar radiculopathy    MGUS (monoclonal gammopathy of unknown significance)    Motion sickness    Nonproliferative diabetic retinopathy ([place])    Osteoarthritis    Peritoneal dialysis catheter in place [facility])    Personal history of chemotherapy    Personal history of radiation therapy    Rectal cancer ([place]) 2011   a.) Clinical stage T3N0. b.) s/p 3 cycles of neoadjuvant CI 5FU + XRT c.) s/p resection on 08/08/2009.   Spinal stenosis of lumbar region with neurogenic claudication    T2DM (type 2 diabetes mellitus) ([place])    TIA (transient ischemic attack)    Past Surgical History:  Procedure Laterality Date   BREAST BIOPSY Left    bx/ clip-neg   COLONOSCOPY N/A 05/03/2020   Procedure: COLONOSCOPY;  Surgeon: Lesly Rubenstein, MD;  Location: [facility] ENDOSCOPY;  Service: Endoscopy;  Laterality: N/A;   coloretcal cancer     DIALYSIS/PERMA CATHETER REMOVAL N/A 10/01/2020   Procedure: DIALYSIS/PERMA CATHETER REMOVAL;  Surgeon: Algernon Huxley, MD;  Location: [facility] CV LAB;   Service: Cardiovascular;  Laterality: N/A;   thyroid gland     parathyroid   TUBAL LIGATION     Family History  Problem Relation Age of Onset   Lung disease Sister    Diabetes Mellitus II Paternal Grandmother    Breast cancer Neg Hx    Social History   Tobacco Use   Smoking status: Never   Smokeless tobacco: Never  Vaping Use   Vaping Use: Never used  Substance Use Topics   Alcohol use: Never   Drug use: Never    Pertinent Clinical Results:  LABS: Labs reviewed: Acceptable for surgery.  No visits with results within 3 Day(s) from this visit.  Latest known visit with results is:  Hospital Outpatient Visit on 01/14/2021  Component Date Value Ref Range Status   aPTT 01/14/2021 31  24 - 36 seconds Final   Performed at [facility], [address] [place], [place] [address]   Sodium 01/14/2021 139  135 - 145 mmol/L Corrected   Comment: CORRECTED RESULTS CORRECTED ON 11/09 AT 1649: PREVIOUSLY REPORTED AS 125    Potassium 01/14/2021 3.3 (A)  3.5 - 5.1 mmol/L Final   Chloride 01/14/2021 98  98 - 111 mmol/L  Final   CO2 01/14/2021 24  22 - 32 mmol/L Final   Glucose, Bld 01/14/2021 158 (A)  70 - 99 mg/dL Final   Glucose reference range applies only to samples taken after fasting for at least 8 hours.   BUN 01/14/2021 32 (A)  8 - 23 mg/dL Final   Creatinine, Ser 01/14/2021 5.39 (A)  0.44 - 1.00 mg/dL Final   Calcium 01/14/2021 8.4 (A)  8.9 - 10.3 mg/dL Final   GFR, Estimated 01/14/2021 8 (A)  >60 mL/min Final   Comment: (NOTE) Calculated using the CKD-EPI Creatinine Equation (2021)   Anion gap 01/14/2021 17 (A)  5 - 15 Corrected   Comment: Performed at [facility], [address] [place], [place] [address] CORRECTED ON 11/09 AT 1649: PREVIOUSLY REPORTED AS 3   WBC 01/14/2021 7.9  4.0 - 10.5 K/uL Final   RBC 01/14/2021 3.52 (A)  3.87 - 5.11 MIL/uL Final   Hemoglobin 01/14/2021 10.7 (A)  12.0 - 15.0 g/dL Final   HCT 01/14/2021 33.7 (A)  36.0 - 46.0 %  Final   MCV 01/14/2021 95.7  80.0 - 100.0 fL Final   MCH 01/14/2021 30.4  26.0 - 34.0 pg Final   MCHC 01/14/2021 31.8  30.0 - 36.0 g/dL Final   RDW 01/14/2021 15.0  11.5 - 15.5 % Final   Platelets 01/14/2021 271  150 - 400 K/uL Final   nRBC 01/14/2021 0.0  0.0 - 0.2 % Final   Performed at [facility], [address] [place], [place] [address]   Prothrombin Time 01/14/2021 12.2  11.4 - 15.2 seconds Final   INR 01/14/2021 0.9  0.8 - 1.2 Final   Comment: (NOTE) INR goal varies based on device and disease states. Performed at [facility], [address] [place], [place] [address]    MRSA, PCR 01/14/2021 NEGATIVE  NEGATIVE Final   Staphylococcus aureus 01/14/2021 NEGATIVE  NEGATIVE Final   Comment: (NOTE) The Xpert SA Assay (FDA approved for NASAL specimens in patients 46 years of age and older), is one component of a comprehensive surveillance program. It is not intended to diagnose infection nor to guide or monitor treatment. Performed at [facility], [address] [place],  [address]    ABO/RH(D) 01/14/2021 A NEG   Final   Antibody Screen 01/14/2021 NEG   Final   Sample Expiration 01/14/2021 01/28/2021,2359   Final   Extend sample reason 01/14/2021    Final                   Value:NO TRANSFUSIONS OR PREGNANCY IN THE PAST 3 MONTHS Performed at [facility], [place], [place] [address]    Color, Urine 01/14/2021 YELLOW (A)  YELLOW Final   APPearance 01/14/2021 HAZY (A)  CLEAR Final   Specific Gravity, Urine 01/14/2021 1.010  1.005 - 1.030 Final   pH 01/14/2021 6.0  5.0 - 8.0 Final   Glucose, UA 01/14/2021 50 (A)  NEGATIVE mg/dL Final   Hgb urine dipstick 01/14/2021 MODERATE (A)  NEGATIVE Final   Bilirubin Urine 01/14/2021 NEGATIVE  NEGATIVE Final   Ketones, ur 01/14/2021 NEGATIVE  NEGATIVE mg/dL Final   Protein, ur 01/14/2021 >=300 (A)  NEGATIVE mg/dL Final   Nitrite 01/14/2021 NEGATIVE  NEGATIVE Final    Leukocytes,Ua 01/14/2021 SMALL (A)  NEGATIVE Final   RBC / HPF 01/14/2021 >50 (A)  0 - 5 RBC/hpf Final   WBC, UA 01/14/2021 21-50  0 - 5 WBC/hpf Final  Bacteria, UA 01/14/2021 RARE (A)  NONE SEEN Final   Squamous Epithelial / LPF 01/14/2021 0-5  0 - 5 Final   Hyaline Casts, UA 01/14/2021 PRESENT   Final   Performed at [facility], [address] [place], [place] [address]   Component Value Date   URINE, CLEAN CATCH Performed at [facility], [address] [place], [place] [address]   CULT 10,000 COLONIES/mL STAPHYLOCOCCUS EPIDERMIDIS (A) 01/14/2021  REPTSTATUS 01/17/2021 FINAL 01/14/2021     ECG: Date: 01/14/2021 Time ECG obtained: 1412 PM Rate: 64 bpm Rhythm:  NSR; LAFB Axis (leads I and aVF): Normal Intervals: PR 200 ms. QRS 98 ms. QTc 443 ms. ST segment and T wave changes: No evidence of acute ST segment elevation or depression Comparison: Similar to previous tracing obtained on 06/21/2020   IMAGING / PROCEDURES: MRI LUMBAR SPINE WO CONTRAST performed on 12/18/2020 At L5-S1, similar severe right foraminal stenosis with impingement. Similar moderate right subarticular recess and left foraminal stenosis. At L4-L5, similar versus slightly progressed left paracentral/foraminal disc protrusion with similar left L4 and L5 nerve root impingement in the subarticular recess and foramen.  TRANSTHORACIC ECHOCARDIOGRAM performed on 11/27/2020 NORMAL LEFT VENTRICULAR SYSTOLIC FUNCTION   WITH MODERATE LVH  NORMAL RIGHT VENTRICULAR SYSTOLIC FUNCTION  MILD VALVULAR REGURGITATION MODERATE VALVULAR STENOSIS ESTIMATED LVEF >55%  Aortic: MODERATE AS; MAX VELOCITY 3.29m/s AND AVA 0.98cm^2  AOV: MODERATELY THICKENED, PARTIALLY MOBILE LEAFLETS  Mitral: MILD MR  Tricuspid: TRIVIAL TR (2.56m/s)  Pulmonic: MILD PI  MODERATELY DILATED ASCENDING AORTA MEASURING 3.7cm   TRANSTHORACIC ECHOCARDIOGRAM performed on 06/22/2020 Left ventricular ejection fraction, by estimation,  is 50 to 55%. The left ventricle has low normal function. The left ventricle has no regional wall motion abnormalities. There is moderate left ventricular hypertrophy. Left ventricular diastolic parameters are consistent with Grade II diastolic dysfunction (pseudonormalization).  Right ventricular systolic function is normal. The right ventricular size is normal.  Left atrial size was mild to moderately dilated.  The mitral valve is normal in structure. Mild to moderate mitral valve regurgitation.  The aortic valve is calcified. Aortic valve regurgitation is mild. Mild to moderate aortic valve stenosis  Impression and Plan:  Leightyn Cina has been referred for pre-anesthesia review and clearance prior to her undergoing the planned anesthetic and procedural courses. Available labs, pertinent testing, and imaging results were personally reviewed by me. This patient has been appropriately cleared by cardiology with an overall ACCEPTABLE risk of significant perioperative cardiovascular complications.  Based on clinical review performed today (01/17/21), barring any significant acute changes in the patient's overall condition, it is anticipated that she will be able to proceed with the planned surgical intervention. Any acute changes in clinical condition may necessitate her procedure being postponed and/or cancelled. Patient will meet with anesthesia team (MD and/or CRNA) on the day of her procedure for preoperative evaluation/assessment. Questions regarding anesthetic course will be fielded at that time.   Pre-surgical instructions were reviewed with the patient during her PAT appointment and questions were fielded by PAT clinical staff. Patient was advised that if any questions or concerns arise prior to her procedure then she should return a call to PAT and/or her surgeon's office to discuss.  Honor Loh, MSN, APRN, FNP-C, CEN [facility]  Peri-operative Services Nurse  Practitioner Phone: [phone] Fax: [phone] 01/17/21 12:14 PM  NOTE: This note has been prepared using Dragon dictation software. Despite my best ability to proofread, there is always the potential that unintentional transcriptional  errors may still occur from this process.

## 2021-01-23 ENCOUNTER — Ambulatory Visit: Payer: Medicare Other | Admitting: Urgent Care

## 2021-01-23 ENCOUNTER — Other Ambulatory Visit: Payer: Self-pay

## 2021-01-23 ENCOUNTER — Ambulatory Visit: Payer: Medicare Other

## 2021-01-23 ENCOUNTER — Encounter: Payer: Self-pay | Admitting: Neurosurgery

## 2021-01-23 ENCOUNTER — Encounter
Admission: RE | Disposition: A | Discharge status: Home / Self Care | Payer: Self-pay | Source: Home / Self Care | Attending: Neurosurgery

## 2021-01-23 ENCOUNTER — Ambulatory Visit
Admission: RE | Admit: 2021-01-23 | Discharge: 2021-01-23 | Disposition: A | Discharge status: Home / Self Care | Payer: Medicare Other | Attending: Neurosurgery | Admitting: Neurosurgery

## 2021-01-23 DIAGNOSIS — D631 Anemia in chronic kidney disease: Secondary | ICD-10-CM | POA: Diagnosis not present

## 2021-01-23 DIAGNOSIS — E890 Postprocedural hypothyroidism: Secondary | ICD-10-CM | POA: Insufficient documentation

## 2021-01-23 DIAGNOSIS — Z85048 Personal history of other malignant neoplasm of rectum, rectosigmoid junction, and anus: Secondary | ICD-10-CM | POA: Diagnosis not present

## 2021-01-23 DIAGNOSIS — I350 Nonrheumatic aortic (valve) stenosis: Secondary | ICD-10-CM | POA: Diagnosis not present

## 2021-01-23 DIAGNOSIS — I132 Hypertensive heart and chronic kidney disease with heart failure and with stage 5 chronic kidney disease, or end stage renal disease: Secondary | ICD-10-CM | POA: Diagnosis not present

## 2021-01-23 DIAGNOSIS — I509 Heart failure, unspecified: Secondary | ICD-10-CM | POA: Insufficient documentation

## 2021-01-23 DIAGNOSIS — E1122 Type 2 diabetes mellitus with diabetic chronic kidney disease: Secondary | ICD-10-CM | POA: Insufficient documentation

## 2021-01-23 DIAGNOSIS — K219 Gastro-esophageal reflux disease without esophagitis: Secondary | ICD-10-CM | POA: Insufficient documentation

## 2021-01-23 DIAGNOSIS — Z8673 Personal history of transient ischemic attack (TIA), and cerebral infarction without residual deficits: Secondary | ICD-10-CM | POA: Diagnosis not present

## 2021-01-23 DIAGNOSIS — M48062 Spinal stenosis, lumbar region with neurogenic claudication: Secondary | ICD-10-CM | POA: Insufficient documentation

## 2021-01-23 DIAGNOSIS — E785 Hyperlipidemia, unspecified: Secondary | ICD-10-CM | POA: Diagnosis not present

## 2021-01-23 DIAGNOSIS — Z9221 Personal history of antineoplastic chemotherapy: Secondary | ICD-10-CM | POA: Insufficient documentation

## 2021-01-23 DIAGNOSIS — D472 Monoclonal gammopathy: Secondary | ICD-10-CM | POA: Diagnosis not present

## 2021-01-23 DIAGNOSIS — M5127 Other intervertebral disc displacement, lumbosacral region: Secondary | ICD-10-CM | POA: Insufficient documentation

## 2021-01-23 DIAGNOSIS — N186 End stage renal disease: Secondary | ICD-10-CM | POA: Insufficient documentation

## 2021-01-23 DIAGNOSIS — M5416 Radiculopathy, lumbar region: Secondary | ICD-10-CM | POA: Diagnosis present

## 2021-01-23 DIAGNOSIS — Z923 Personal history of irradiation: Secondary | ICD-10-CM | POA: Insufficient documentation

## 2021-01-23 DIAGNOSIS — Z992 Dependence on renal dialysis: Secondary | ICD-10-CM | POA: Insufficient documentation

## 2021-01-23 DIAGNOSIS — Z419 Encounter for procedure for purposes other than remedying health state, unspecified: Secondary | ICD-10-CM

## 2021-01-23 DIAGNOSIS — M1990 Unspecified osteoarthritis, unspecified site: Secondary | ICD-10-CM | POA: Insufficient documentation

## 2021-01-23 HISTORY — DX: End stage renal disease: Z99.2

## 2021-01-23 HISTORY — DX: Unspecified osteoarthritis, unspecified site: M19.90

## 2021-01-23 HISTORY — DX: Spinal stenosis, lumbar region with neurogenic claudication: M48.062

## 2021-01-23 HISTORY — DX: Motion sickness, initial encounter: T75.3XXA

## 2021-01-23 HISTORY — DX: Other complications of anesthesia, initial encounter: T88.59XA

## 2021-01-23 HISTORY — DX: Other chronic pain: G89.29

## 2021-01-23 HISTORY — DX: Type 2 diabetes mellitus with mild nonproliferative diabetic retinopathy without macular edema, unspecified eye: E11.3299

## 2021-01-23 HISTORY — DX: Failed or difficult intubation, initial encounter: T88.4XXA

## 2021-01-23 HISTORY — DX: Hyperparathyroidism, unspecified: E21.3

## 2021-01-23 HISTORY — DX: Hyperlipidemia, unspecified: E78.5

## 2021-01-23 HISTORY — DX: Anemia in chronic kidney disease: N18.9

## 2021-01-23 HISTORY — PX: LUMBAR LAMINECTOMY/DECOMPRESSION MICRODISCECTOMY: SHX5026

## 2021-01-23 HISTORY — DX: Low back pain, unspecified: M54.50

## 2021-01-23 HISTORY — DX: Hypertensive retinopathy, unspecified eye: H35.039

## 2021-01-23 HISTORY — DX: Radiculopathy, lumbar region: M54.16

## 2021-01-23 HISTORY — DX: Type 2 diabetes mellitus without complications: E11.9

## 2021-01-23 HISTORY — DX: Monoclonal gammopathy: D47.2

## 2021-01-23 HISTORY — DX: End stage renal disease: N18.6

## 2021-01-23 HISTORY — DX: Anemia in chronic kidney disease: D63.1

## 2021-01-23 HISTORY — DX: Unspecified glaucoma: H40.9

## 2021-01-23 LAB — POCT I-STAT, CHEM 8
BUN: 26 mg/dL — ABNORMAL HIGH (ref 8–23)
Calcium, Ion: 1.22 mmol/L (ref 1.15–1.40)
Chloride: 105 mmol/L (ref 98–111)
Creatinine, Ser: 5.1 mg/dL — ABNORMAL HIGH (ref 0.44–1.00)
Glucose, Bld: 95 mg/dL (ref 70–99)
HCT: 32 % — ABNORMAL LOW (ref 36.0–46.0)
Hemoglobin: 10.9 g/dL — ABNORMAL LOW (ref 12.0–15.0)
Potassium: 3.7 mmol/L (ref 3.5–5.1)
Sodium: 140 mmol/L (ref 135–145)
TCO2: 25 mmol/L (ref 22–32)

## 2021-01-23 LAB — GLUCOSE, CAPILLARY: Glucose-Capillary: 127 mg/dL — ABNORMAL HIGH (ref 70–99)

## 2021-01-23 IMAGING — RF DG C-ARM 1-60 MIN
1 series · 7 of 7 positions shown · non-contrast
Comparison: MR, [DATE].

CLINICAL DATA: Intraoperative fluoroscopy provided for L5-S1 micro
discectomy.

EXAM:
LUMBAR SPINE - 7 VIEW; DG C-ARM 1-60 MIN

[Series 1: dg x-ray · 0.20mm/px · 7 of 7 slices shown]
[im 1/7]
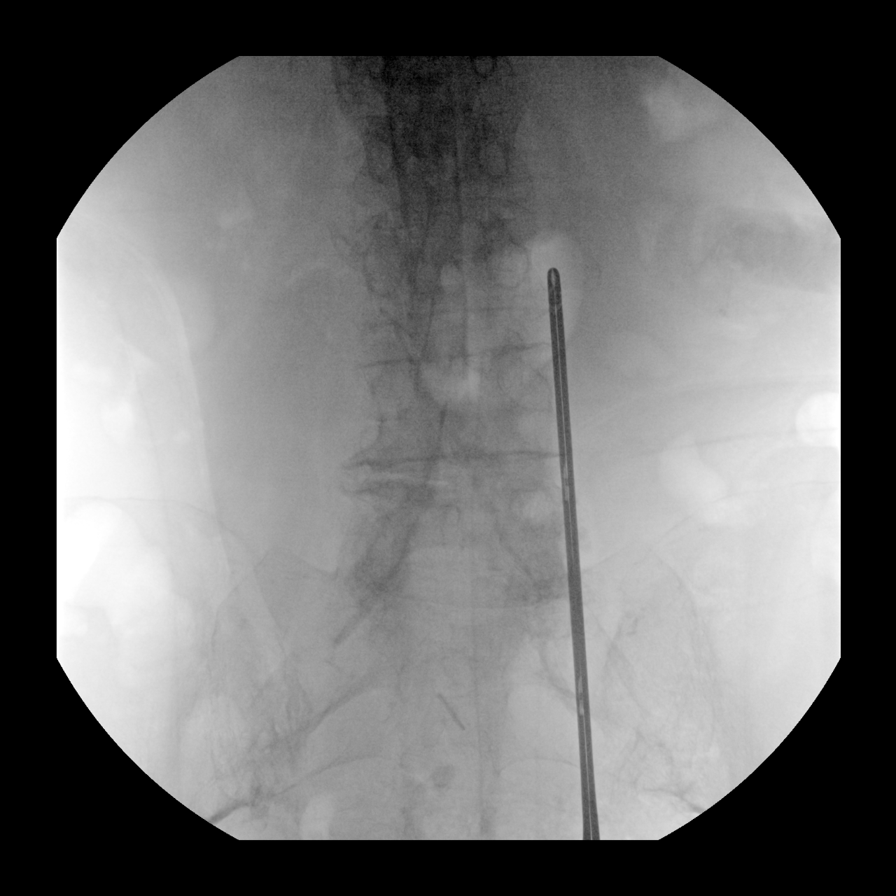
[im 2/7]
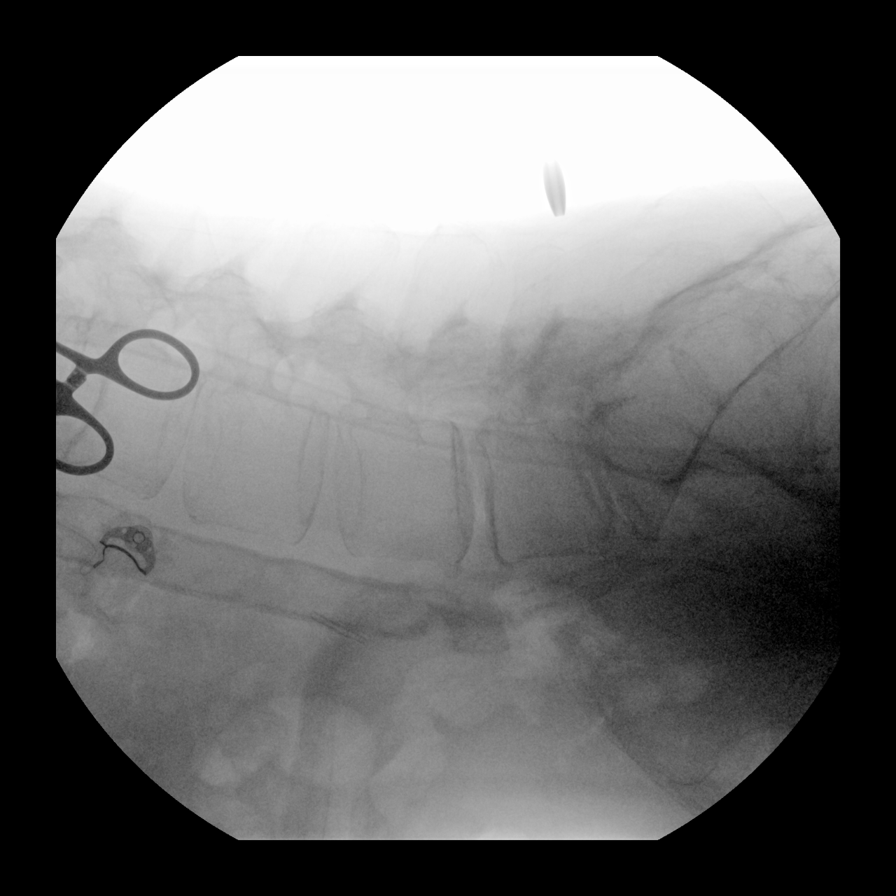
[im 3/7]
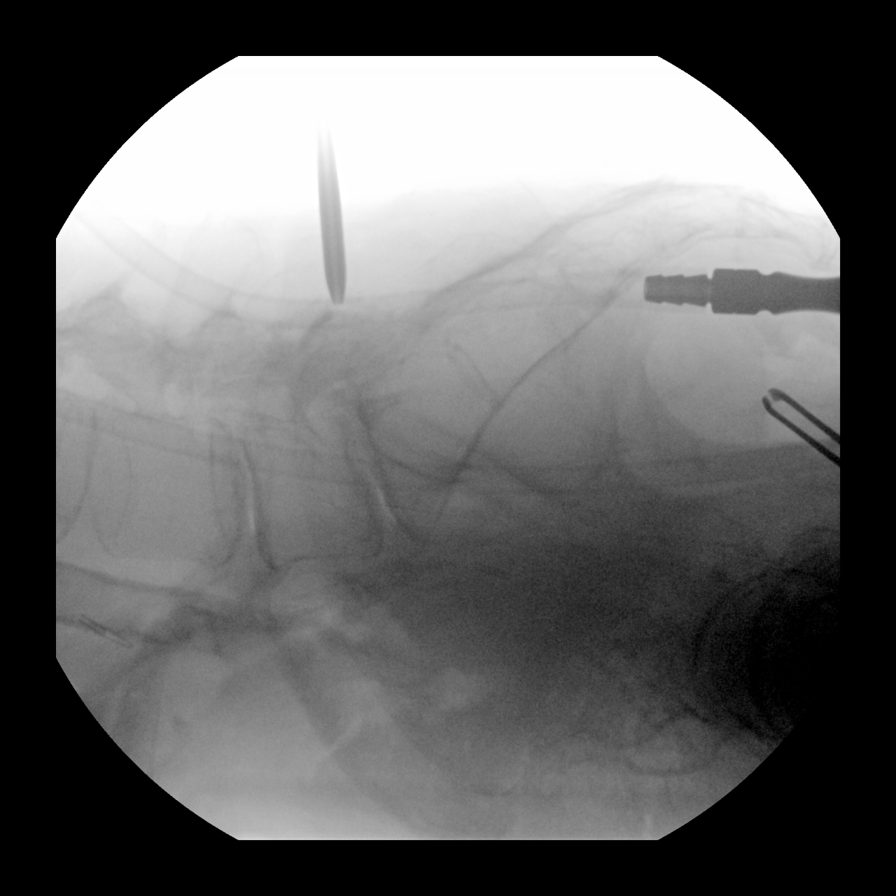
[im 4/7]
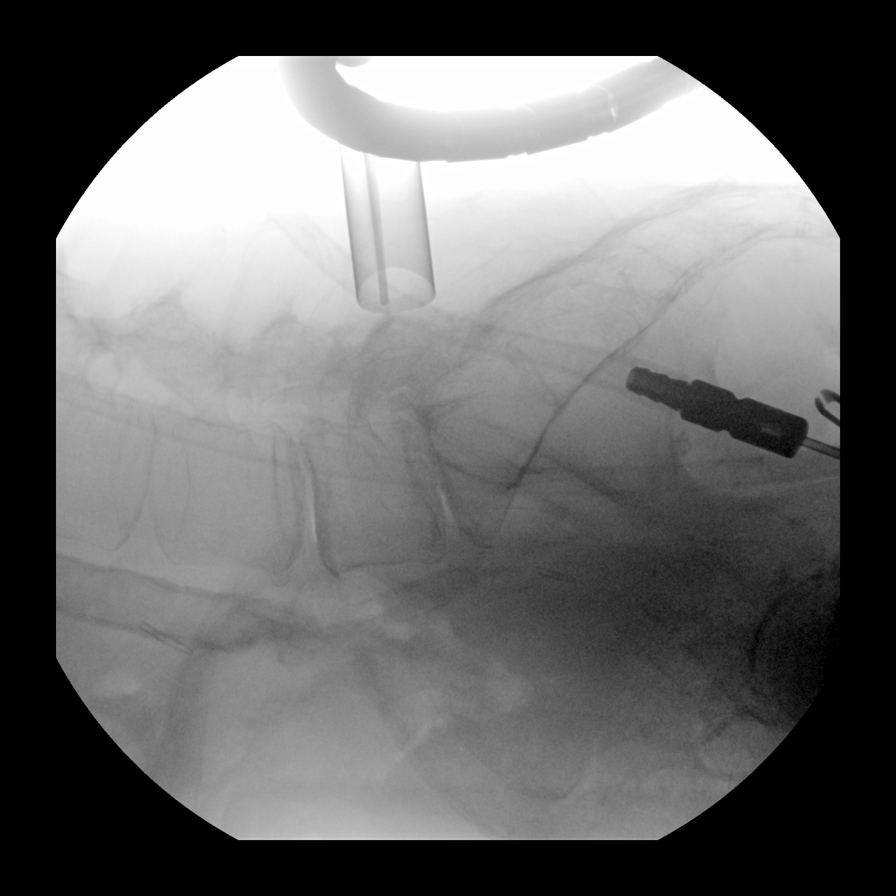
[im 5/7]
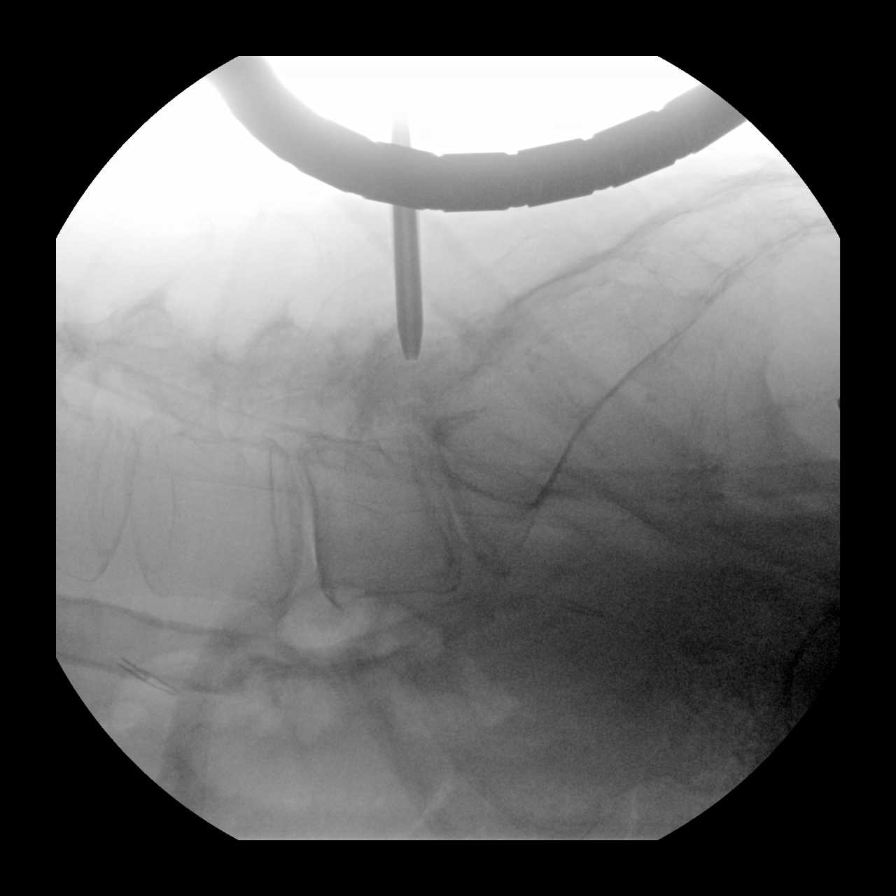
[im 6/7]
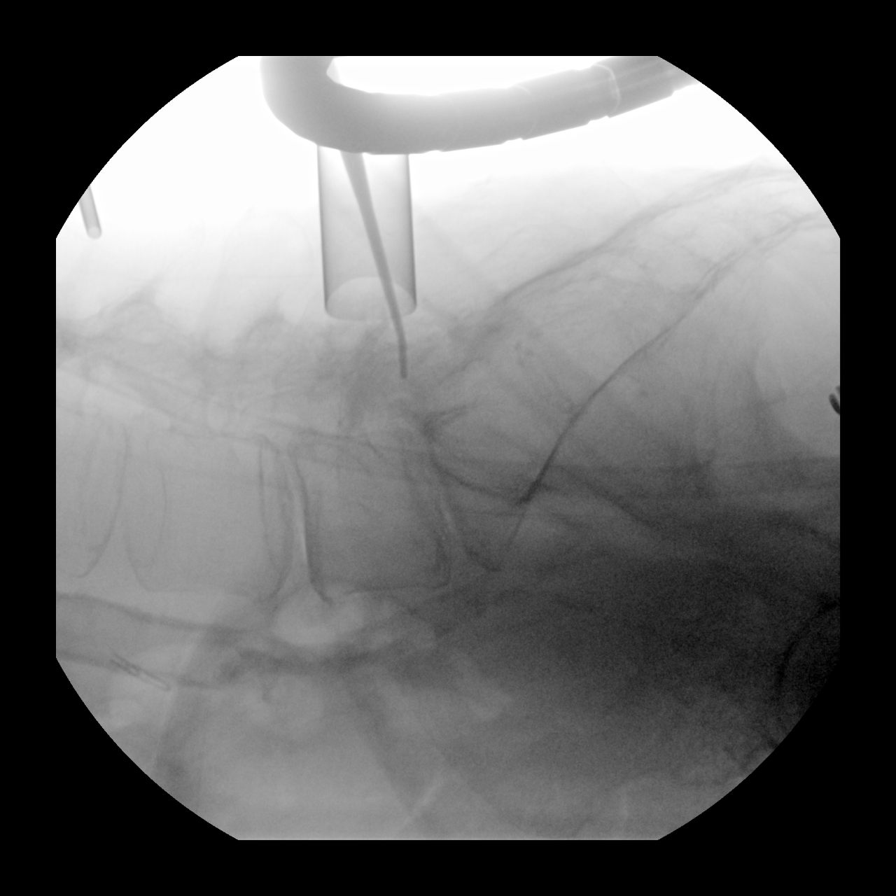
[im 7/7]
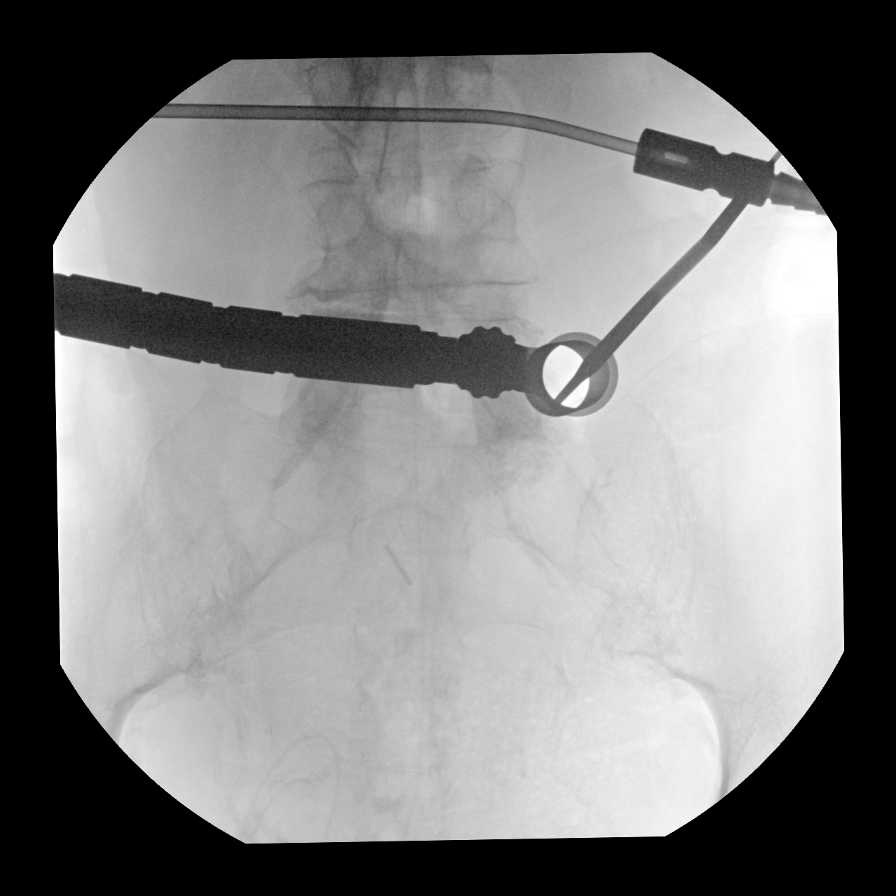

[7 of 7 positions shown; findings below may reference images not displayed]

FLUOROSCOPY TIME:  Fluoroscopy Time:  8 seconds

Radiation Exposure Index (if provided by the fluoroscopic device):
3.59 mGy

Number of Acquired Spot Images: 7
FINDINGS: Submitted images show placement of an operative microscope posterior
to the L5-S1 level for L5-S1 micro discectomy.
IMPRESSION: Operative imaging provided for L5-S1 micro discectomy.

## 2021-01-23 IMAGING — RF DG LUMBAR SPINE 2-3V
1 series · 7 of 7 positions shown · non-contrast
Comparison: MR, [DATE].

CLINICAL DATA: Intraoperative fluoroscopy provided for L5-S1 micro
discectomy.

EXAM:
LUMBAR SPINE - 7 VIEW; DG C-ARM 1-60 MIN

[Series 1: dg x-ray · 0.20mm/px · 7 of 7 slices shown]
[im 1/7]
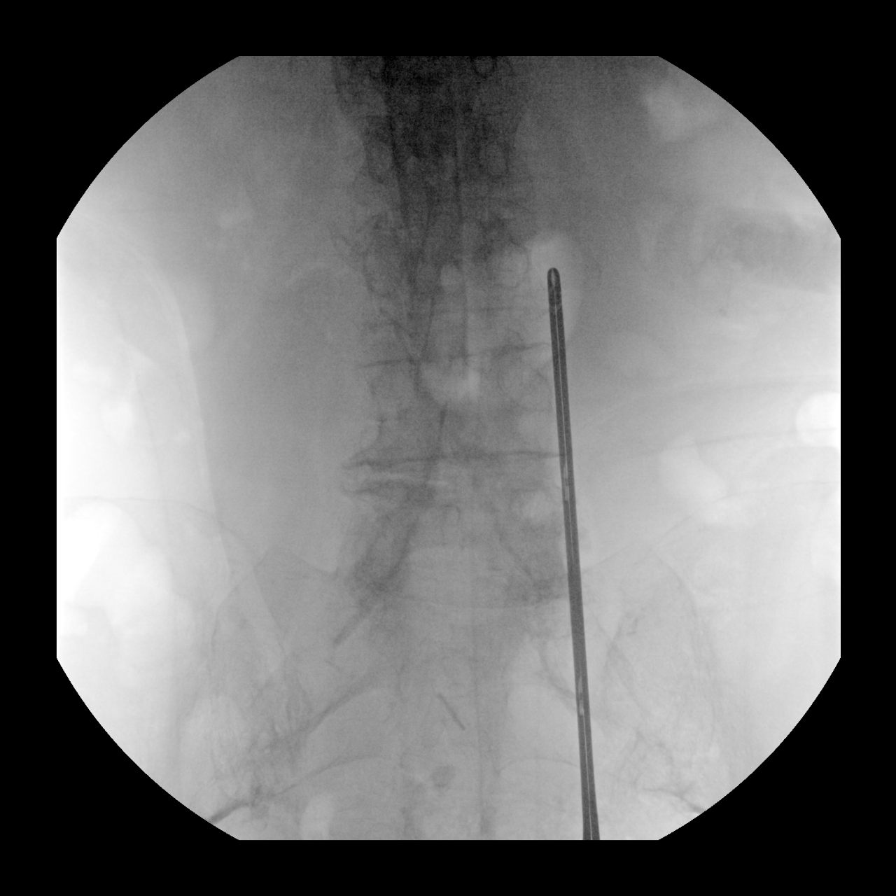
[im 2/7]
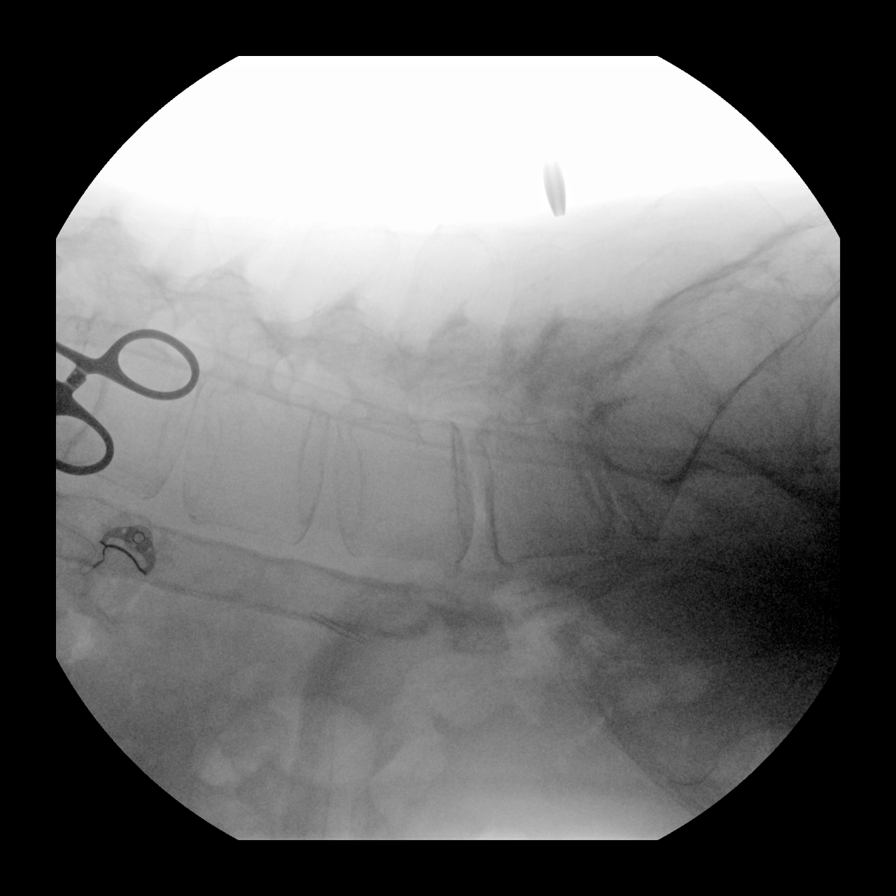
[im 3/7]
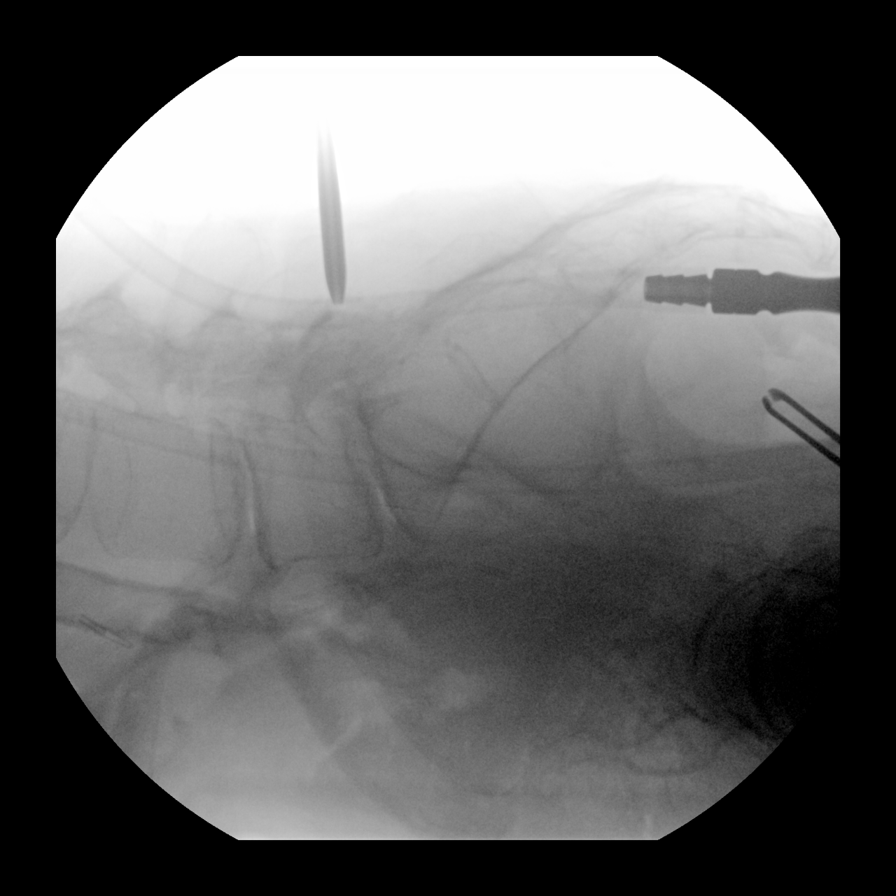
[im 4/7]
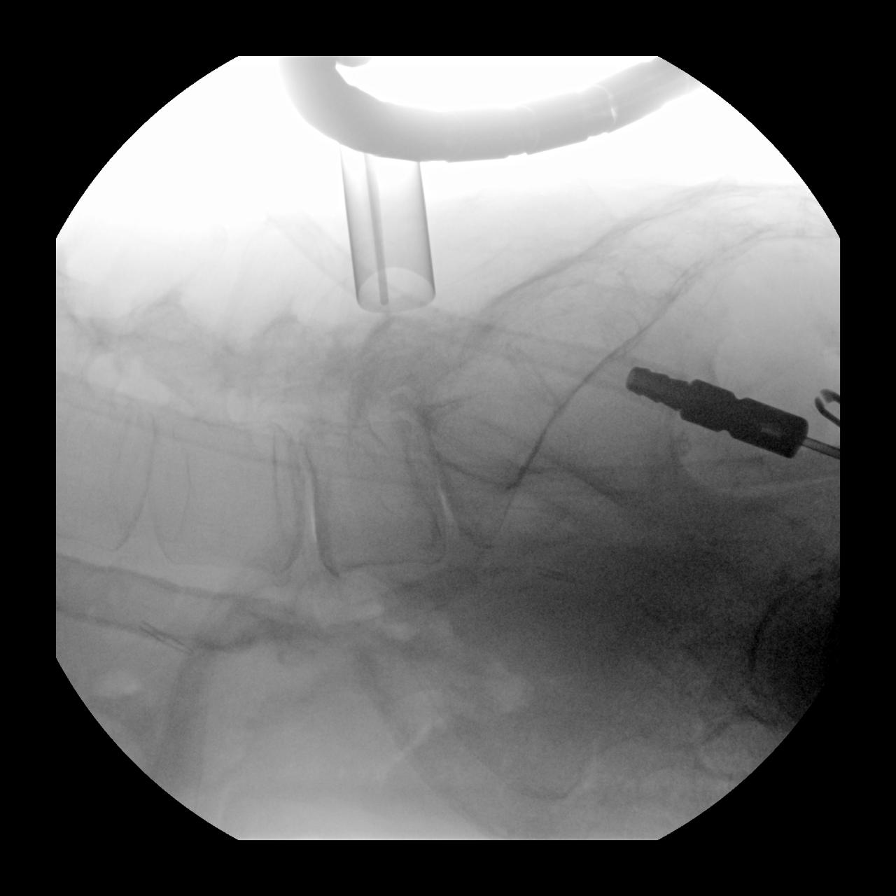
[im 5/7]
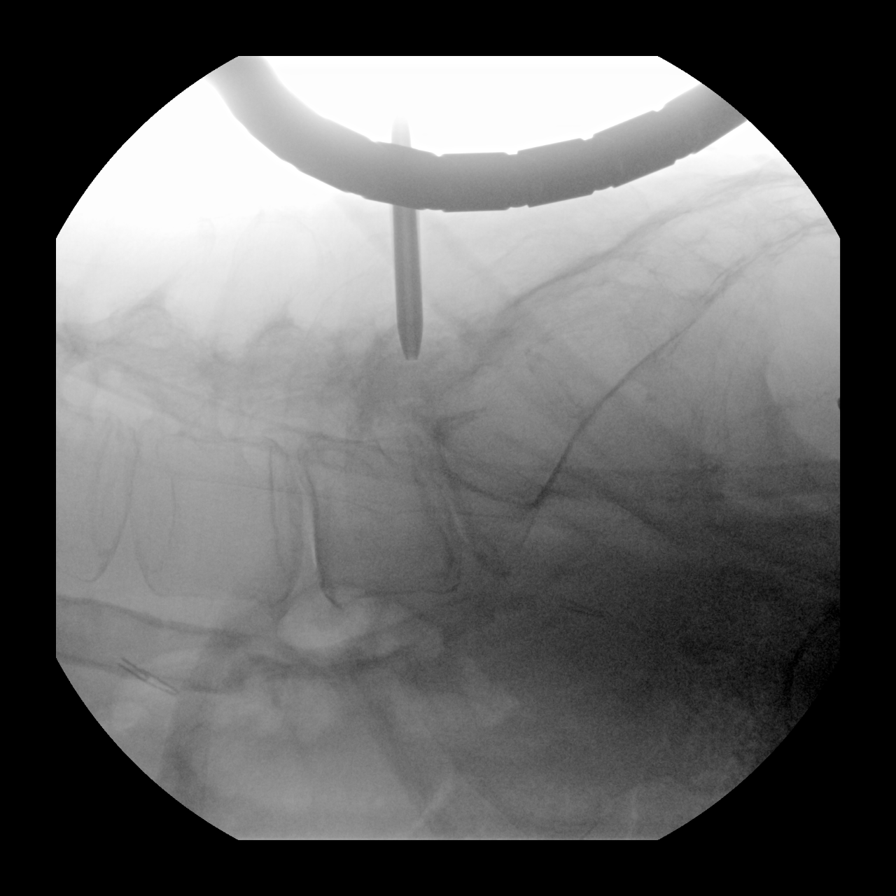
[im 6/7]
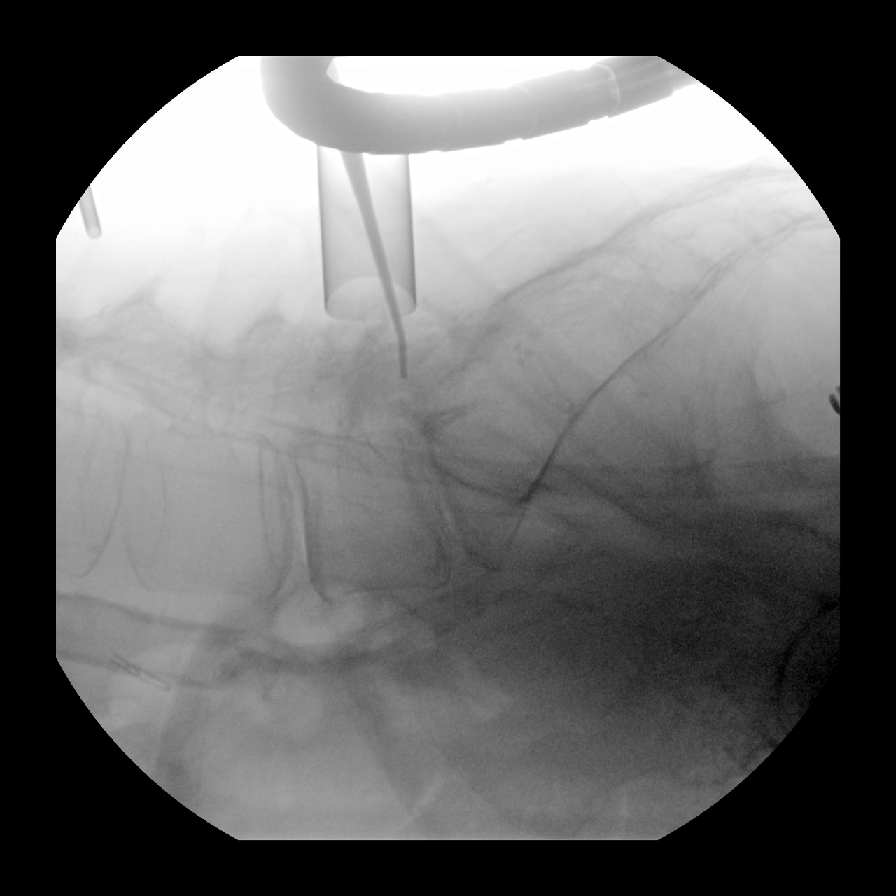
[im 7/7]
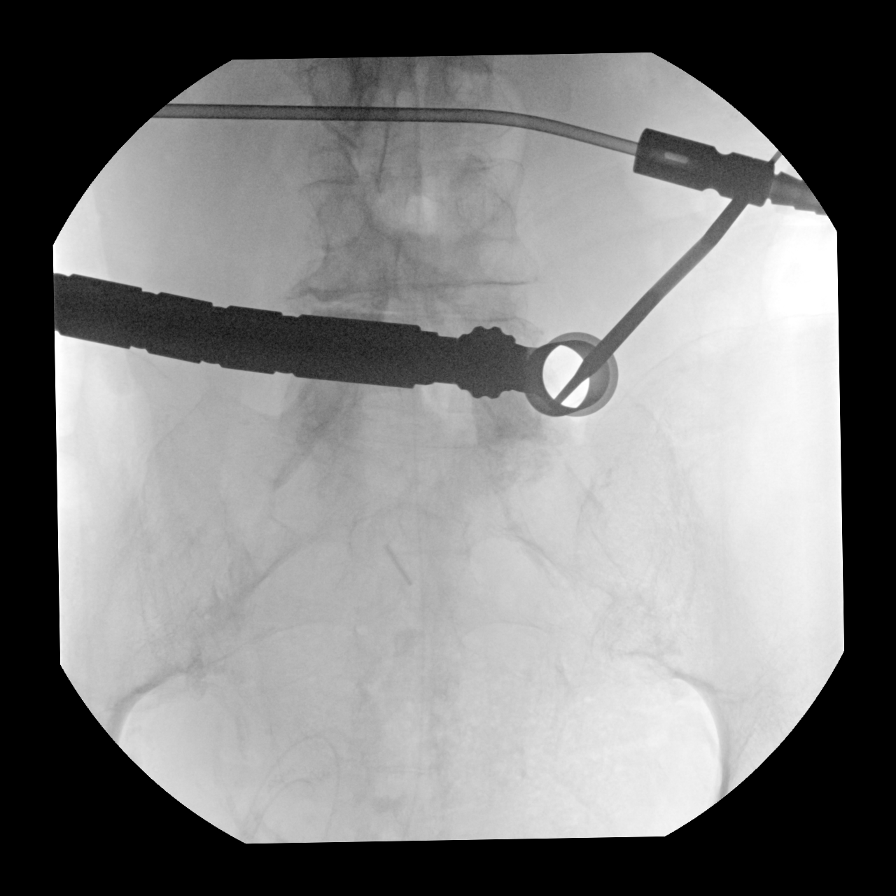

[7 of 7 positions shown; findings below may reference images not displayed]

FLUOROSCOPY TIME:  Fluoroscopy Time:  8 seconds

Radiation Exposure Index (if provided by the fluoroscopic device):
3.59 mGy

Number of Acquired Spot Images: 7
FINDINGS: Submitted images show placement of an operative microscope posterior
to the L5-S1 level for L5-S1 micro discectomy.
IMPRESSION: Operative imaging provided for L5-S1 micro discectomy.

## 2021-01-23 SURGERY — FAR LATERAL DECOMPRESSION 1 LEVEL
Anesthesia: General | Laterality: Right

## 2021-01-23 MED ORDER — OXYCODONE HCL 5 MG PO TABS: 5.0000 mg | ORAL_TABLET | Freq: Once | ORAL | Status: DC | PRN

## 2021-01-23 MED ORDER — CEFAZOLIN SODIUM-DEXTROSE 2-4 GM/100ML-% IV SOLN
2.0000 g | INTRAVENOUS | Status: AC
  Administered 2021-01-23: 2 g via INTRAVENOUS

## 2021-01-23 MED ORDER — SENNA 8.6 MG PO TABS: 1.0000 | ORAL_TABLET | Freq: Every day | ORAL | 0 refills | Status: DC | PRN

## 2021-01-23 MED ORDER — 0.9 % SODIUM CHLORIDE (POUR BTL) OPTIME
TOPICAL | Status: DC | PRN
  Administered 2021-01-23: 1000 mL

## 2021-01-23 MED ORDER — DEXAMETHASONE SODIUM PHOSPHATE 10 MG/ML IJ SOLN
INTRAMUSCULAR | Status: DC | PRN
  Administered 2021-01-23: 5 mg via INTRAVENOUS

## 2021-01-23 MED ORDER — CHLORHEXIDINE GLUCONATE 0.12 % MT SOLN: 15.0000 mL | Freq: Once | OROMUCOSAL | Status: AC

## 2021-01-23 MED ORDER — ACETAMINOPHEN 10 MG/ML IV SOLN: 1000.0000 mg | Freq: Once | INTRAVENOUS | Status: DC | PRN

## 2021-01-23 MED ORDER — OXYCODONE HCL 5 MG/5ML PO SOLN: 5.0000 mg | Freq: Once | ORAL | Status: DC | PRN

## 2021-01-23 MED ORDER — OXYCODONE HCL 5 MG PO TABS: 5.0000 mg | ORAL_TABLET | Freq: Four times a day (QID) | ORAL | 0 refills | Status: AC | PRN

## 2021-01-23 MED ORDER — ORAL CARE MOUTH RINSE: 15.0000 mL | Freq: Once | OROMUCOSAL | Status: AC

## 2021-01-23 MED ORDER — IPRATROPIUM-ALBUTEROL 0.5-2.5 (3) MG/3ML IN SOLN
3.0000 mL | RESPIRATORY_TRACT | Status: AC
  Administered 2021-01-23: 3 mL via RESPIRATORY_TRACT

## 2021-01-23 MED ORDER — PROPOFOL 10 MG/ML IV BOLUS
INTRAVENOUS | Status: AC
  Filled 2021-01-23: qty 20

## 2021-01-23 MED ORDER — METHYLPREDNISOLONE ACETATE 40 MG/ML IJ SUSP
INTRAMUSCULAR | Status: DC | PRN
  Administered 2021-01-23: 40 mg

## 2021-01-23 MED ORDER — PHENYLEPHRINE HCL (PRESSORS) 10 MG/ML IV SOLN
INTRAVENOUS | Status: AC
  Filled 2021-01-23: qty 1

## 2021-01-23 MED ORDER — LIDOCAINE HCL (CARDIAC) PF 100 MG/5ML IV SOSY
PREFILLED_SYRINGE | INTRAVENOUS | Status: DC | PRN
  Administered 2021-01-23: 60 mg via INTRAVENOUS

## 2021-01-23 MED ORDER — SODIUM CHLORIDE 0.9 % IV SOLN: INTRAVENOUS | Status: DC

## 2021-01-23 MED ORDER — PHENYLEPHRINE HCL (PRESSORS) 10 MG/ML IV SOLN
INTRAVENOUS | Status: DC | PRN
  Administered 2021-01-23 (×5): 100 ug via INTRAVENOUS
  Administered 2021-01-23: 50 ug via INTRAVENOUS
  Administered 2021-01-23 (×2): 100 ug via INTRAVENOUS

## 2021-01-23 MED ORDER — METHOCARBAMOL 500 MG PO TABS: 500.0000 mg | ORAL_TABLET | Freq: Three times a day (TID) | ORAL | 0 refills | Status: DC | PRN

## 2021-01-23 MED ORDER — CHLORHEXIDINE GLUCONATE 0.12 % MT SOLN
OROMUCOSAL | Status: AC
  Administered 2021-01-23: 15 mL via OROMUCOSAL
  Filled 2021-01-23: qty 15

## 2021-01-23 MED ORDER — CEFAZOLIN SODIUM-DEXTROSE 2-4 GM/100ML-% IV SOLN
INTRAVENOUS | Status: AC
  Filled 2021-01-23: qty 100

## 2021-01-23 MED ORDER — FENTANYL CITRATE (PF) 100 MCG/2ML IJ SOLN: 25.0000 ug | INTRAMUSCULAR | Status: DC | PRN

## 2021-01-23 MED ORDER — ONDANSETRON HCL 4 MG/2ML IJ SOLN: 4.0000 mg | Freq: Once | INTRAMUSCULAR | Status: DC | PRN

## 2021-01-23 MED ORDER — FENTANYL CITRATE (PF) 100 MCG/2ML IJ SOLN
INTRAMUSCULAR | Status: AC
  Filled 2021-01-23: qty 2

## 2021-01-23 MED ORDER — PHENYLEPHRINE HCL-NACL 20-0.9 MG/250ML-% IV SOLN
INTRAVENOUS | Status: DC | PRN
  Administered 2021-01-23: 35 ug/min via INTRAVENOUS

## 2021-01-23 MED ORDER — IPRATROPIUM-ALBUTEROL 0.5-2.5 (3) MG/3ML IN SOLN
RESPIRATORY_TRACT | Status: AC
  Filled 2021-01-23: qty 3

## 2021-01-23 MED ORDER — FENTANYL CITRATE (PF) 100 MCG/2ML IJ SOLN
INTRAMUSCULAR | Status: DC | PRN
  Administered 2021-01-23 (×3): 50 ug via INTRAVENOUS

## 2021-01-23 MED ORDER — PROPOFOL 10 MG/ML IV BOLUS
INTRAVENOUS | Status: DC | PRN
  Administered 2021-01-23 (×2): 40 mg via INTRAVENOUS
  Administered 2021-01-23: 60 mg via INTRAVENOUS

## 2021-01-23 MED ORDER — SUGAMMADEX SODIUM 200 MG/2ML IV SOLN
INTRAVENOUS | Status: DC | PRN
  Administered 2021-01-23: 180 mg via INTRAVENOUS

## 2021-01-23 MED ORDER — ROCURONIUM BROMIDE 10 MG/ML (PF) SYRINGE
PREFILLED_SYRINGE | INTRAVENOUS | Status: AC
  Filled 2021-01-23: qty 10

## 2021-01-23 MED ORDER — ROCURONIUM BROMIDE 100 MG/10ML IV SOLN
INTRAVENOUS | Status: DC | PRN
  Administered 2021-01-23: 30 mg via INTRAVENOUS

## 2021-01-23 MED ORDER — SURGIFLO WITH THROMBIN (HEMOSTATIC MATRIX KIT) OPTIME
TOPICAL | Status: DC | PRN
  Administered 2021-01-23: 1 via TOPICAL

## 2021-01-23 MED ORDER — SODIUM CHLORIDE (PF) 0.9 % IJ SOLN
INTRAMUSCULAR | Status: DC | PRN
  Administered 2021-01-23: 60 mL

## 2021-01-23 MED ORDER — BUPIVACAINE-EPINEPHRINE (PF) 0.5% -1:200000 IJ SOLN
INTRAMUSCULAR | Status: DC | PRN
  Administered 2021-01-23: 9 mL

## 2021-01-23 SURGICAL SUPPLY — 60 items
"PENCIL ELECTRO HAND CTR " (MISCELLANEOUS) IMPLANT
ADH SKN CLS APL DERMABOND .7 (GAUZE/BANDAGES/DRESSINGS) ×1
AGENT HMST KT MTR STRL THRMB (HEMOSTASIS) ×1
APL PRP STRL LF DISP 70% ISPRP (MISCELLANEOUS) ×2
BUR NEURO DRILL SOFT 3.0X3.8M (BURR) ×3 IMPLANT
CHLORAPREP W/TINT 26 (MISCELLANEOUS) ×6 IMPLANT
CNTNR SPEC 2.5X3XGRAD LEK (MISCELLANEOUS) ×1
CONT SPEC 4OZ STER OR WHT (MISCELLANEOUS) ×2
CONT SPEC 4OZ STRL OR WHT (MISCELLANEOUS) ×1
CONTAINER SPEC 2.5X3XGRAD LEK (MISCELLANEOUS) ×1 IMPLANT
COUNTER NEEDLE 20/40 LG (NEEDLE) ×3 IMPLANT
CUP MEDICINE 2OZ PLAST GRAD ST (MISCELLANEOUS) ×6 IMPLANT
DERMABOND ADVANCED (GAUZE/BANDAGES/DRESSINGS) ×2
DERMABOND ADVANCED .7 DNX12 (GAUZE/BANDAGES/DRESSINGS) ×1 IMPLANT
DRAPE C ARM PK CFD 31 SPINE (DRAPES) ×3 IMPLANT
DRAPE LAPAROTOMY 100X77 ABD (DRAPES) ×3 IMPLANT
DRAPE MICROSCOPE SPINE 48X150 (DRAPES) ×3 IMPLANT
DRAPE SURG 17X11 SM STRL (DRAPES) ×12 IMPLANT
DRSG OPSITE POSTOP 4X6 (GAUZE/BANDAGES/DRESSINGS) ×2 IMPLANT
ELECT CAUTERY BLADE TIP 2.5 (TIP) ×3
ELECT EZSTD 165MM 6.5IN (MISCELLANEOUS)
ELECT REM PT RETURN 9FT ADLT (ELECTROSURGICAL) ×3
ELECTRODE CAUTERY BLDE TIP 2.5 (TIP) ×1 IMPLANT
ELECTRODE EZSTD 165MM 6.5IN (MISCELLANEOUS) IMPLANT
ELECTRODE REM PT RTRN 9FT ADLT (ELECTROSURGICAL) ×1 IMPLANT
GAUZE 4X4 16PLY ~~LOC~~+RFID DBL (SPONGE) ×3 IMPLANT
GLOVE SURG SYN 6.5 ES PF (GLOVE) ×12 IMPLANT
GLOVE SURG SYN 6.5 PF PI (GLOVE) ×2 IMPLANT
GLOVE SURG SYN 8.5  E (GLOVE) ×6
GLOVE SURG SYN 8.5 E (GLOVE) ×3 IMPLANT
GLOVE SURG SYN 8.5 PF PI (GLOVE) ×3 IMPLANT
GLOVE SURG UNDER POLY LF SZ6.5 (GLOVE) ×5 IMPLANT
GOWN SRG LRG LVL 4 IMPRV REINF (GOWNS) ×1 IMPLANT
GOWN SRG XL LVL 3 NONREINFORCE (GOWNS) ×1 IMPLANT
GOWN STRL NON-REIN TWL XL LVL3 (GOWNS) ×3
GOWN STRL REIN LRG LVL4 (GOWNS) ×3
GRADUATE 1200CC STRL 31836 (MISCELLANEOUS) ×3 IMPLANT
KIT SPINAL PRONEVIEW (KITS) ×3 IMPLANT
MANIFOLD NEPTUNE II (INSTRUMENTS) ×3 IMPLANT
MARKER SKIN DUAL TIP RULER LAB (MISCELLANEOUS) ×6 IMPLANT
NDL SAFETY ECLIPSE 18X1.5 (NEEDLE) ×1 IMPLANT
NEEDLE HYPO 18GX1.5 SHARP (NEEDLE) ×3
NEEDLE HYPO 22GX1.5 SAFETY (NEEDLE) ×3 IMPLANT
NS IRRIG 1000ML POUR BTL (IV SOLUTION) ×3 IMPLANT
PACK LAMINECTOMY NEURO (CUSTOM PROCEDURE TRAY) ×3 IMPLANT
PAD ARMBOARD 7.5X6 YLW CONV (MISCELLANEOUS) ×3 IMPLANT
PENCIL ELECTRO HAND CTR (MISCELLANEOUS) ×3 IMPLANT
SURGIFLO W/THROMBIN 8M KIT (HEMOSTASIS) ×3 IMPLANT
SUT DVC VLOC 3-0 CL 6 P-12 (SUTURE) ×7 IMPLANT
SUT VIC AB 0 CT1 27 (SUTURE) ×3
SUT VIC AB 0 CT1 27XCR 8 STRN (SUTURE) ×1 IMPLANT
SUT VIC AB 2-0 CT1 18 (SUTURE) ×3 IMPLANT
SYR 10ML LL (SYRINGE) ×3 IMPLANT
SYR 20ML LL LF (SYRINGE) ×3 IMPLANT
SYR 30ML LL (SYRINGE) ×6 IMPLANT
SYR 3ML LL SCALE MARK (SYRINGE) ×3 IMPLANT
TOWEL OR 17X26 4PK STRL BLUE (TOWEL DISPOSABLE) ×7 IMPLANT
TUBING CONNECTING 10 (TUBING) ×2 IMPLANT
TUBING CONNECTING 10' (TUBING) ×1
WATER STERILE IRR 500ML POUR (IV SOLUTION) ×1 IMPLANT

## 2021-01-23 NOTE — Discharge Instructions (Addendum)
Your surgeon has performed an operation on your lumbar spine (low back) to relieve pressure on one or more nerves. Many times, patients feel better immediately after surgery and can "overdo it." Even if you feel well, it is important that you follow these activity guidelines. If you do not let your back heal properly from the surgery, you can increase the chance of a disc herniation and/or return of your symptoms. The following are instructions to help in your recovery once you have been discharged from the hospital.  * Do not take anti-inflammatory medications for 3 days after surgery (naproxen [Aleve], ibuprofen [Advil, Motrin], celecoxib [Celebrex], etc.)  Hold home Aspirin for 7 days.  Activity    No bending, lifting, or twisting ("BLT"). Avoid lifting objects heavier than 10 pounds (gallon milk jug).  Where possible, avoid household activities that involve lifting, bending, pushing, or pulling such as laundry, vacuuming, grocery shopping, and childcare. Try to arrange for help from friends and family for these activities while your back heals.  Increase physical activity slowly as tolerated.  Taking short walks is encouraged, but avoid strenuous exercise. Do not jog, run, bicycle, lift weights, or participate in any other exercises unless specifically allowed by your doctor. Avoid prolonged sitting, including car rides.  Talk to your doctor before resuming sexual activity.  You should not drive until cleared by your doctor.  Until released by your doctor, you should not return to work or school.  You should rest at home and let your body heal.   You may shower three days after your surgery.  After showering, lightly dab your incision dry. Do not take a tub bath or go swimming for 3 weeks, or until approved by your doctor at your follow-up appointment.  If you smoke, we strongly recommend that you quit.  Smoking has been proven to interfere with normal healing in your back and will  dramatically reduce the success rate of your surgery. Please contact [facility] (800-QUIT-NOW) and use the resources at www.[facility].com for assistance in stopping smoking.  Surgical Incision   If you have a dressing on your incision, you may remove it three days after your surgery. Keep your incision area clean and dry.  Your incision was closed with Dermabond glue. The glue should begin to peel away within about a week   Diet            You may return to your usual diet. Be sure to stay hydrated.  When to Contact us  Although your surgery and recovery will likely be uneventful, you may have some residual numbness, aches, and pains in your back and/or legs. This is normal and should improve in the next few weeks.  However, should you experience any of the following, contact us immediately: New numbness or weakness Pain that is progressively getting worse, and is not relieved by your pain medications or rest Bleeding, redness, swelling, pain, or drainage from surgical incision Chills or flu-like symptoms Fever greater than 101.0 F (38.3 C) Problems with bowel or bladder functions Difficulty breathing or shortness of breath Warmth, tenderness, or swelling in your calf  Contact Information During office hours (Monday-Friday 9 am to 5 pm), please call your physician at [phone] After hours and weekends, please call [phone] and speak with the answering service, who will contact the doctor on call.  If that fails, call the [facility] Operator at [phone] and ask for the Neurosurgery Resident On Call  For a life-threatening emergency, call 911   AMBULATORY  SURGERY  DISCHARGE INSTRUCTIONS   The drugs that you were given will stay in your system until tomorrow so for the next 24 hours you should not:  Drive an automobile Make any legal decisions Drink any alcoholic beverage   You may resume regular meals tomorrow.  Today it is better to start with liquids and gradually work  up to solid foods.  You may eat anything you prefer, but it is better to start with liquids, then soup and crackers, and gradually work up to solid foods.   Please notify your doctor immediately if you have any unusual bleeding, trouble breathing, redness and pain at the surgery site, drainage, fever, or pain not relieved by medication.    Your post-operative visit with Dr.                                       is: Date:                        Time:    Please call to schedule your post-operative visit.  Additional Instructions:

## 2021-01-23 NOTE — Progress Notes (Signed)
PHARMACY -  BRIEF ANTIBIOTIC NOTE   Pharmacy has received consult(s) for Cefazolin from an OR provider.  The patient's profile has been reviewed for ht/wt/allergies/indication/available labs.    One time order(s) placed for Cefazolin 2 gm  Further antibiotics/pharmacy consults should be ordered by admitting physician if indicated.                       Thank you, Renda Rolls, PharmD, [facility] 01/23/2021 6:32 AM

## 2021-01-23 NOTE — Progress Notes (Signed)
Patient able to ambulate to restroom and able to void 25 mL of clear, yellow urine. Notified Dr. Izora Ribas and he ok with patient to d/c home.

## 2021-01-23 NOTE — Op Note (Signed)
Indications: The patient is a 80 yo female who presented with lumbar radiculopathy and neurogenic claudication who failed conservative management.  Findings: far lateral disc herniation at L5/S1.  Preoperative Diagnosis: Lumbar radiculopathy, neurogenic claudication Postoperative Diagnosis: same   EBL: 25 ml IVF: 500 ml Drains: none Disposition: Extubated and Stable to PACU Complications: none  No foley catheter was placed.   Preoperative Note:   Risks of surgery discussed include: infection, bleeding, stroke, coma, death, paralysis, CSF leak, nerve/spinal cord injury, numbness, tingling, weakness, complex regional pain syndrome, recurrent stenosis and/or disc herniation, vascular injury, development of instability, neck/back pain, need for further surgery, persistent symptoms, development of deformity, and the risks of anesthesia. The patient understood these risks and agreed to proceed.  Operative Note:   1) Right L5/S1 Far lateral discectomy and foraminotomy 2) Right L5-S1 laminectomy and medial facetectomy  The patient was then brought from the preoperative center with intravenous access established.  The patient underwent general anesthesia and endotracheal tube intubation, and was then rotated on the [place] rail top where all pressure points were appropriately padded.  The skin was then thoroughly cleansed.  Perioperative antibiotic prophylaxis was administered.  Sterile prep and drapes were then applied and a timeout was then observed.  C-arm was brought into the field under sterile conditions, and the L5/S1 disc space identified and marked with an incision on the right ~4cm lateral to midline.   Once this was complete a 2 cm incision was opened with the use of a #10 blade knife.  The Metrx tubes were sequentially advanced under lateral fluoroscopy until a 18 x 40 mm Metrx tube was placed over the right L5 and S1 intertransverse membrane and secured to the bed.    The microscope  was then sterilely brought into the field and muscle creep was hemostased with a bipolar and resected with a pituitary rongeur.  A Bovie extender was then used to expose the transverse process and lateral facet.  Careful attention was placed to not violate the facet capsule. A 3 mm matchstick drill bit was then used to drill off lateral L5/S1 facet, and lateral pars.  The intertransverse membrane was identified, and carefully dissected free from the caudal transverse process.    Using careful dissected, the superior aspect of the S1 pedicle was identified and exposed down to its junction with the vertebral body.  The disc was identified. The disc was entered using a small Penfield 4. The L5 nerve root was identified and freed using a Penfield 4 and balltip probe.  The disc herniation was identified and dissected free using a balltip probe. The pituitary rongeur was used to remove the extruded disc fragments. Once the nerve root were noted to be relaxed and under less tension the ball-tipped feeler was passed along the foramen proximally to to ensure no residual compression was noted.    A Depo-Medrol soaked Gelfoam pledget was placed along the nerve root for 2 minutes and removed.  The area was irrigated. The tube system was then removed under microscopic visualization and hemostasis was obtained with a bipolar.    After performing the decompression at L5-S1 in the foramen, a separate incision was made for central decompression. The metrx tubes were sequentially advanced and confirmed in position at L5-S1 over the lamina. An 24mm by 40mm tube was locked in place to the bed side attachment.  Fluoroscopy was then removed from the field.  The microscope was then sterilely brought into the field and muscle creep was hemostased  with a bipolar and resected with a pituitary rongeur.  A Bovie extender was then used to expose the spinous process and lamina.  Careful attention was placed to not violate the facet  capsule. A 3 mm matchstick drill bit was then used to make a hemi-laminotomy trough until the ligamentum flavum was exposed.  This was extended to the base of the spinous process and to the contralateral side to remove all the central bone from each side.  Once this was complete and the underlying ligamentum flavum was visualized, it was dissected with a curette and resected with Kerrison rongeurs.  Extensive ligamentum hypertrophy was noted, requiring a substantial amount of time and care for removal.  The dura was identified and palpated. The kerrison rongeur was then used to remove the medial facet bilaterally until no compression was noted.  A balltip probe was used to confirm decompression of the ipsilateral S1 nerve root.  A Depo-Medrol soaked Gelfoam pledget was placed in the defect.  The wound was copiously irrigated. The tube system was then removed under microscopic visualization and hemostasis was obtained with a bipolar.     Each incision was closed in similar fashion. The fascial layer was reapproximated with the use of a 0- Vicryl suture.  Subcutaneous tissue layer was reapproximated using 2-0 Vicryl suture.  3-0 monocryl was used on the skin. The skin was then cleansed and Dermabond was used to close the skin opening.  Patient was then rotated back to the preoperative bed awakened from anesthesia and taken to recovery all counts are correct in this case.   I performed the entire procedure with the assistance of Cooper Render PA as an Pensions consultant.  Meade Maw MD

## 2021-01-23 NOTE — Discharge Summary (Signed)
Physician Discharge Summary  Patient ID: Misty [name] MRN: [id number] DOB/AGE: 09/14/1940 80 y.o.  Admit date: 01/23/2021 Discharge date: 01/23/2021  Admission Diagnoses: lumbar radiculopathy  Discharge Diagnoses:  Active Problems:   * No active hospital problems. *   Discharged Condition: good  Hospital Course:  Misty [name] is a 80 y.o s/p right L5-S1 far lateral discectomy L5-S1 decompression. Her interoperative course was uncomplicated. She was monitored in PACU and discharged home after ambulating, urinating, and tolerating PO intake.   Consults: None  Significant Diagnostic Studies: none  Treatments: surgery: As above. See operative report for further detail  Discharge Exam: There were no vitals taken for this visit. CN II-XII grossly intact 5/5 throughout BLE Incision c/d/I with post-op bandage in place  Disposition: Discharge disposition: 01-Home or Self Care       Discharge Instructions      Remove dressing in 72 hours   Complete by: As directed       Allergies as of 01/23/2021       Reactions   Succinylcholine Other (See Comments)   Severe myalgias   Penicillins Rash   Tolerated cefazolin prior        Medication List     STOP taking these medications    aspirin EC 81 MG tablet   traMADol 50 MG tablet Commonly known as: ULTRAM       TAKE these medications    acetaminophen 325 MG tablet Commonly known as: TYLENOL Take 650 mg by mouth every 4 (four) hours as needed for moderate pain.   carvedilol 12.5 MG tablet Commonly known as: COREG Take 12.5 mg by mouth 2 (two) times daily with a meal.   carvedilol 6.25 MG tablet Commonly known as: COREG Take 1 tablet (6.25 mg total) by mouth 2 (two) times daily with a meal.   cetirizine 10 MG tablet Commonly known as: ZYRTEC Take 10 mg by mouth every morning.   colestipol 1 g tablet Commonly known as: COLESTID Take 1 g by mouth 2 (two) times daily.   famotidine 20 MG  tablet Commonly known as: PEPCID Take 20 mg by mouth every morning.   Fiber Diet Tabs Take 2 tablets by mouth 2 (two) times daily.   furosemide 40 MG tablet Commonly known as: LASIX Take 1 tablet (40 mg total) by mouth daily.   gabapentin 300 MG capsule Commonly known as: NEURONTIN Take 1 capsule (300 mg total) by mouth at bedtime. What changed: when to take this   glucose blood test strip 1 each by Finger Stick route 4 times daily (after meals and nightly). Use as directed   HumaLOG 100 UNIT/ML injection Generic drug: insulin lispro Inject 4-12 Units into the skin 4 (four) times daily as needed (blood sugar of 150 or above).   hydrALAZINE 50 MG tablet Commonly known as: APRESOLINE Take 1 tablet (50 mg total) by mouth every 8 (eight) hours. What changed: when to take this   insulin glargine 100 UNIT/ML injection Commonly known as: LANTUS Inject 0.1 mLs (10 Units total) into the skin 2 (two) times daily. What changed: how much to take   INSULIN SYRINGE 1CC/29G 29G X 1/2" 1 ML Misc   lidocaine 5 % Commonly known as: LIDODERM Place 1 patch onto the skin daily as needed (pain).   methocarbamol 500 MG tablet Commonly known as: Robaxin Take 1 tablet (500 mg total) by mouth every 8 (eight) hours as needed for muscle spasms.   multivitamin Tabs tablet Take 1 tablet by mouth  every morning.   NIFEdipine 90 MG 24 hr tablet Commonly known as: PROCARDIA XL/NIFEDICAL-XL Take 90 mg by mouth every morning.   oxyCODONE 5 MG immediate release tablet Commonly known as: Roxicodone Take 1 tablet (5 mg total) by mouth every 6 (six) hours as needed for up to 5 days for severe pain.   senna 8.6 MG Tabs tablet Commonly known as: SENOKOT Take 1 tablet (8.6 mg total) by mouth daily as needed for mild constipation.   sevelamer carbonate 800 MG tablet Commonly known as: RENVELA Take 1,600 mg by mouth 2 (two) times daily with a meal.   Sure Comfort Pen Needles 31G X 8 MM Misc Generic  drug: Insulin Pen Needle   Vitamin D 50 MCG (2000 UT) tablet Take 2,000 Units by mouth every Monday, Wednesday, and Friday.        Follow-up Information     Loleta Dicker, PA Follow up in 2 week(s).   Why: for incision check Contact information: [place] [address] [phone]                 Signed: Loleta Dicker 01/23/2021, 10:36 AM

## 2021-01-23 NOTE — Anesthesia Procedure Notes (Signed)
Arterial Line Insertion Start/End11/16/2022 9:40 AM, 01/23/2021 9:41 AM Performed by: Arita Miss, MD, anesthesiologist  Patient location: OR. Preanesthetic checklist: patient identified, IV checked, site marked, risks and benefits discussed, surgical consent, monitors and equipment checked, pre-op evaluation, timeout performed and anesthesia consent Patient sedated Left, radial was placed Catheter size: 20 G Hand hygiene performed  and maximum sterile barriers used   Attempts: 1 Procedure performed without using ultrasound guided technique. Following insertion, dressing applied. Post procedure assessment: normal and unchanged  Patient tolerated the procedure well with no immediate complications.

## 2021-01-23 NOTE — Anesthesia Preprocedure Evaluation (Addendum)
Anesthesia Evaluation  Patient identified by MRN, date of birth, ID band Patient awake  General Assessment Comment:  Previous airway note: "1st attempt with Miller 3, rigid/fixed anterior larynx, unable to visualize anything but epiglottis. Glidescope to OR, great view with VL, 1st attempt to pass ETT unsuccessful due to anterior orientation. ETT out, greater bend placed on ETT and attempts to pass 2nd time successful with effort. "  Hx severe myalgias after succinylcholine. Does not appear to be a malignant hyperthermia episode; patient says all she had was muscle aches, no life threatening reactions, and it seems she had inhalational anesthetics since then.  Reviewed: Allergy & Precautions, NPO status , Patient's Chart, lab work & pertinent test results  History of Anesthesia Complications (+) DIFFICULT AIRWAY and history of anesthetic complications  Airway Mallampati: III  TM Distance: >3 FB Neck ROM: Limited    Dental no notable dental hx. (+) Poor Dentition, Implants   Pulmonary neg pulmonary ROS, neg sleep apnea, neg COPD, Patient abstained from smoking.Not current smoker,    Pulmonary exam normal breath sounds clear to auscultation       Cardiovascular Exercise Tolerance: Good METShypertension, Pt. on medications +CHF  (-) CAD and (-) Past MI (-) dysrhythmias + Valvular Problems/Murmurs AS  Rhythm:Regular Rate:Normal + Systolic murmurs TTE 6659: IMPRESSIONS    1. Left ventricular ejection fraction, by estimation, is 50 to 55%. The  left ventricle has low normal function. The left ventricle has no regional  wall motion abnormalities. There is moderate left ventricular hypertrophy.  Left ventricular diastolic  parameters are consistent with Grade II diastolic dysfunction  (pseudonormalization).  2. Right ventricular systolic function is normal. The right ventricular  size is normal.  3. Left atrial size was mild to  moderately dilated.  4. The mitral valve is normal in structure. Mild to moderate mitral valve  regurgitation.  5. The aortic valve is calcified. Aortic valve regurgitation is mild.  Mild to moderate aortic valve stenosis.    Neuro/Psych TIA Neuromuscular disease negative psych ROS   GI/Hepatic GERD  ,(+)     (-) substance abuse  ,   Endo/Other  diabetes  Renal/GU ESRF and DialysisRenal diseaseOn peritoneal dialysis , last dialyzed last night     Musculoskeletal  (+) Arthritis ,   Abdominal   Peds  Hematology  (+) anemia ,   Anesthesia Other Findings Past Medical History: No date: Anemia of chronic renal failure 07/02/2017: Aortic valve stenosis     Comment:  a.) TTE 07/02/2017: 55-60%; mild AS with MPG 13.0 mmHg.               b.) TTE 10/14/2019: EF >55%; MPG 23.0 mmHg. c.) TTE               06/23/2020: EF 50-55%; MPG 19.7 mmHg. d.) TTE 11/27/2020:              EF >55%; MPG 18.0 mmHg. No date: Chronic lower back pain No date: Complication of anesthesia     Comment:  a.) severe myalgias with succinylcholine use. b.) (+)               difficult airway. 93/57/0177: Diastolic dysfunction     Comment:  a.) TTE 07/02/2017; EF 55-60%; mild AR; G1DD. b.) TTE               06/23/2020: EF 50-55% with mild LVH; mild to mod LA  enlargement; mild AR, mild to mod MR; G2DD. No date: Difficult airway for intubation     Comment:  a.) 08/08/2020 at [facility] --> Initial attempt with Sabra Heck               3, larynx was "rigid/fixed"; unable to visualize anything              but epiglottis. Glidescope utilized to obtain a great               view with VL. First attempt with Glidescope unsuccessful               d/t anterior orientation. ETT withdrawn and a greater               bend was placed. Second attempt was successful with               effort. Patient sustained lip laceration during               intubation attempts. 11/03/2019: Dilatation of thoracic aorta ([place])      Comment:  a.) TTE 11/03/2019: measured 3.7 cm No date: ESRD on peritoneal dialysis ([place]) No date: GERD (gastroesophageal reflux disease) No date: Glaucoma No date: Heart murmur No date: HLD (hyperlipidemia) No date: Hyperparathyroidism ([place])     Comment:  a.) s/p total parathyroidectomy No date: Hypertension No date: Hypertensive retinopathy No date: Lumbar radiculopathy No date: MGUS (monoclonal gammopathy of unknown significance) No date: Motion sickness No date: Nonproliferative diabetic retinopathy ([place]) No date: Osteoarthritis No date: Peritoneal dialysis catheter in place [facility]) No date: Personal history of chemotherapy No date: Personal history of radiation therapy 2011: 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 b.) s/p 3 cycles of neoadjuvant              CI 5FU + XRT c.) s/p resection on 08/08/2009. No date: Spinal stenosis of lumbar region with neurogenic claudication No date: T2DM (type 2 diabetes mellitus) ([place]) No date: TIA (transient ischemic attack)  Reproductive/Obstetrics                            Anesthesia Physical Anesthesia Plan  ASA: 3  Anesthesia Plan: General   Post-op Pain Management:    Induction: Intravenous  PONV Risk Score and Plan: 4 or greater and Ondansetron, Dexamethasone and Treatment may vary due to age or medical condition  Airway Management Planned: Oral ETT and Video Laryngoscope Planned  Additional Equipment: Arterial line  Intra-op Plan:   Post-operative Plan: Extubation in OR  Informed Consent: I have reviewed the patients History and Physical, chart, labs and discussed the procedure including the risks, benefits and alternatives for the proposed anesthesia with the patient or authorized representative who has indicated his/her understanding and acceptance.     Dental advisory given  Plan Discussed with: CRNA and Surgeon  Anesthesia Plan Comments: (Discussed risks of anesthesia  with patient, including PONV, sore throat, lip/dental/eye damage. Rare risks discussed as well, such as cardiorespiratory and neurological sequelae, and allergic reactions. Discussed the role of CRNA in patient's perioperative care. Patient understands.)       Anesthesia Quick Evaluation

## 2021-01-23 NOTE — H&P (Signed)
I have reviewed and confirmed my history and physical from 12/28/2020 with no additions or changes. Plan for R L5-S1 decompression and R L5-S1 far lateral discectomy and foraminotomy.  Risks and benefits reviewed.  Heart sounds normal no MRG. Chest Clear to Auscultation Bilaterally.

## 2021-01-23 NOTE — Anesthesia Postprocedure Evaluation (Signed)
Anesthesia Post Note  Patient: Shaquavia Whisonant  Procedure(s) Performed: RIGHT L5-S1 FAR LATERAL DISCECTOMY (Right) RIGHT L5-S1 DECOMPRESSION (Right)  Patient location during evaluation: PACU Anesthesia Type: General Level of consciousness: awake and alert Pain management: pain level controlled Vital Signs Assessment: post-procedure vital signs reviewed and stable Respiratory status: spontaneous breathing, nonlabored ventilation, respiratory function stable and patient connected to nasal cannula oxygen Cardiovascular status: blood pressure returned to baseline and stable Postop Assessment: no apparent nausea or vomiting Anesthetic complications: no   No notable events documented.   Last Vitals:  Vitals:   01/23/21 1141 01/23/21 1228  BP: (!) 163/85 (!) 135/56  Pulse: 88 85  Resp: 16 16  Temp: (!) 35.8 C (!) 36.1 C  SpO2: 92% 96%    Last Pain:  Vitals:   01/23/21 1228  TempSrc: Temporal  PainSc: 0-No pain                 Arita Miss

## 2021-01-23 NOTE — Transfer of Care (Signed)
Immediate Anesthesia Transfer of Care Note  Patient: Misty [name]  Procedure(s) Performed: RIGHT L5-S1 FAR LATERAL DISCECTOMY (Right) RIGHT L5-S1 DECOMPRESSION (Right)  Patient Location: PACU  Anesthesia Type:General  Level of Consciousness: awake and alert   Airway & Oxygen Therapy: Patient Spontanous Breathing and Patient connected to nasal cannula oxygen  Post-op Assessment: Report given to RN and Post -op Vital signs reviewed and stable  Post vital signs: Reviewed and stable  Last Vitals:  Vitals Value Taken Time  BP 154/80 01/23/21 1045  Temp    Pulse 83 01/23/21 1047  Resp 16 01/23/21 1047  SpO2 98 % 01/23/21 1047  Vitals shown include unvalidated device data.  Last Pain: There were no vitals filed for this visit.       Complications: No notable events documented.

## 2021-01-23 NOTE — Anesthesia Procedure Notes (Signed)
Procedure Name: Intubation Date/Time: 01/23/2021 9:00 AM Performed by: Arita Miss, MD Pre-anesthesia Checklist: Patient identified, Patient being monitored, Timeout performed, Emergency Drugs available and Suction available Patient Re-evaluated:Patient Re-evaluated prior to induction Oxygen Delivery Method: Circle system utilized Preoxygenation: Pre-oxygenation with 100% oxygen Induction Type: IV induction Ventilation: Mask ventilation without difficulty and Oral airway inserted - appropriate to patient size Laryngoscope Size: 3 and Glidescope Grade View: Grade I Tube type: Oral Tube size: 7.0 mm Number of attempts: 2 Airway Equipment and Method: Stylet and Video-laryngoscopy Placement Confirmation: ETT inserted through vocal cords under direct vision, positive ETCO2 and breath sounds checked- equal and bilateral Secured at: 21 cm Tube secured with: Tape Dental Injury: Teeth and Oropharynx as per pre-operative assessment  Difficulty Due To: Difficulty was anticipated and Difficult Airway- due to anterior larynx Future Recommendations: Recommend- induction with short-acting agent, and alternative techniques readily available Comments: Anticipated difficult airway. Glidescope 4 blade utilized with Grade 3 view. Second attempt at laryngoscopy with Glidcescope 3 resulted in Grade 1 view with easy intubation. Atraumatic.

## 2021-02-19 DIAGNOSIS — C801 Malignant (primary) neoplasm, unspecified: Secondary | ICD-10-CM | POA: Insufficient documentation

## 2021-06-04 ENCOUNTER — Encounter: Payer: Self-pay | Admitting: Emergency Medicine

## 2021-06-04 ENCOUNTER — Emergency Department: Payer: Medicare Other

## 2021-06-04 ENCOUNTER — Other Ambulatory Visit: Payer: Self-pay

## 2021-06-04 ENCOUNTER — Inpatient Hospital Stay
Admission: EM | Admit: 2021-06-04 | Discharge: 2021-06-08 | DRG: 356 | Disposition: A | Discharge status: Home Health | Payer: Medicare Other | Attending: Internal Medicine | Admitting: Internal Medicine

## 2021-06-04 DIAGNOSIS — Z888 Allergy status to other drugs, medicaments and biological substances status: Secondary | ICD-10-CM

## 2021-06-04 DIAGNOSIS — K625 Hemorrhage of anus and rectum: Secondary | ICD-10-CM | POA: Diagnosis present

## 2021-06-04 DIAGNOSIS — G8929 Other chronic pain: Secondary | ICD-10-CM | POA: Diagnosis present

## 2021-06-04 DIAGNOSIS — K922 Gastrointestinal hemorrhage, unspecified: Secondary | ICD-10-CM | POA: Diagnosis not present

## 2021-06-04 DIAGNOSIS — Z992 Dependence on renal dialysis: Secondary | ICD-10-CM

## 2021-06-04 DIAGNOSIS — D631 Anemia in chronic kidney disease: Secondary | ICD-10-CM | POA: Diagnosis present

## 2021-06-04 DIAGNOSIS — D62 Acute posthemorrhagic anemia: Secondary | ICD-10-CM | POA: Diagnosis present

## 2021-06-04 DIAGNOSIS — Z880 Allergy status to penicillin: Secondary | ICD-10-CM

## 2021-06-04 DIAGNOSIS — K55041 Focal (segmental) acute infarction of large intestine: Secondary | ICD-10-CM | POA: Diagnosis present

## 2021-06-04 DIAGNOSIS — N2581 Secondary hyperparathyroidism of renal origin: Secondary | ICD-10-CM | POA: Diagnosis present

## 2021-06-04 DIAGNOSIS — Z9221 Personal history of antineoplastic chemotherapy: Secondary | ICD-10-CM

## 2021-06-04 DIAGNOSIS — H35039 Hypertensive retinopathy, unspecified eye: Secondary | ICD-10-CM | POA: Diagnosis present

## 2021-06-04 DIAGNOSIS — I5032 Chronic diastolic (congestive) heart failure: Secondary | ICD-10-CM | POA: Diagnosis present

## 2021-06-04 DIAGNOSIS — E1129 Type 2 diabetes mellitus with other diabetic kidney complication: Secondary | ICD-10-CM | POA: Diagnosis present

## 2021-06-04 DIAGNOSIS — C9000 Multiple myeloma not having achieved remission: Secondary | ICD-10-CM | POA: Diagnosis present

## 2021-06-04 DIAGNOSIS — R296 Repeated falls: Secondary | ICD-10-CM | POA: Diagnosis present

## 2021-06-04 DIAGNOSIS — H409 Unspecified glaucoma: Secondary | ICD-10-CM | POA: Diagnosis present

## 2021-06-04 DIAGNOSIS — I132 Hypertensive heart and chronic kidney disease with heart failure and with stage 5 chronic kidney disease, or end stage renal disease: Secondary | ICD-10-CM | POA: Diagnosis present

## 2021-06-04 DIAGNOSIS — Z85048 Personal history of other malignant neoplasm of rectum, rectosigmoid junction, and anus: Secondary | ICD-10-CM | POA: Diagnosis not present

## 2021-06-04 DIAGNOSIS — E1122 Type 2 diabetes mellitus with diabetic chronic kidney disease: Secondary | ICD-10-CM | POA: Diagnosis present

## 2021-06-04 DIAGNOSIS — M5416 Radiculopathy, lumbar region: Secondary | ICD-10-CM | POA: Diagnosis present

## 2021-06-04 DIAGNOSIS — Z794 Long term (current) use of insulin: Secondary | ICD-10-CM

## 2021-06-04 DIAGNOSIS — I350 Nonrheumatic aortic (valve) stenosis: Secondary | ICD-10-CM | POA: Diagnosis present

## 2021-06-04 DIAGNOSIS — I10 Essential (primary) hypertension: Secondary | ICD-10-CM | POA: Diagnosis present

## 2021-06-04 DIAGNOSIS — I708 Atherosclerosis of other arteries: Secondary | ICD-10-CM | POA: Diagnosis present

## 2021-06-04 DIAGNOSIS — N186 End stage renal disease: Secondary | ICD-10-CM | POA: Diagnosis present

## 2021-06-04 DIAGNOSIS — R531 Weakness: Secondary | ICD-10-CM | POA: Diagnosis present

## 2021-06-04 DIAGNOSIS — I700 Atherosclerosis of aorta: Secondary | ICD-10-CM | POA: Diagnosis not present

## 2021-06-04 DIAGNOSIS — D649 Anemia, unspecified: Secondary | ICD-10-CM

## 2021-06-04 DIAGNOSIS — E785 Hyperlipidemia, unspecified: Secondary | ICD-10-CM | POA: Diagnosis present

## 2021-06-04 DIAGNOSIS — Z923 Personal history of irradiation: Secondary | ICD-10-CM

## 2021-06-04 DIAGNOSIS — L8990 Pressure ulcer of unspecified site, unspecified stage: Secondary | ICD-10-CM | POA: Insufficient documentation

## 2021-06-04 DIAGNOSIS — Z836 Family history of other diseases of the respiratory system: Secondary | ICD-10-CM

## 2021-06-04 DIAGNOSIS — K219 Gastro-esophageal reflux disease without esophagitis: Secondary | ICD-10-CM | POA: Diagnosis present

## 2021-06-04 DIAGNOSIS — Z79899 Other long term (current) drug therapy: Secondary | ICD-10-CM

## 2021-06-04 DIAGNOSIS — Z833 Family history of diabetes mellitus: Secondary | ICD-10-CM

## 2021-06-04 DIAGNOSIS — Z8673 Personal history of transient ischemic attack (TIA), and cerebral infarction without residual deficits: Secondary | ICD-10-CM | POA: Diagnosis not present

## 2021-06-04 DIAGNOSIS — I771 Stricture of artery: Secondary | ICD-10-CM | POA: Diagnosis not present

## 2021-06-04 LAB — CBC
HCT: 21.9 % — ABNORMAL LOW (ref 36.0–46.0)
Hemoglobin: 7 g/dL — ABNORMAL LOW (ref 12.0–15.0)
MCH: 31.5 pg (ref 26.0–34.0)
MCHC: 32 g/dL (ref 30.0–36.0)
MCV: 98.6 fL (ref 80.0–100.0)
Platelets: 405 10*3/uL — ABNORMAL HIGH (ref 150–400)
RBC: 2.22 MIL/uL — ABNORMAL LOW (ref 3.87–5.11)
RDW: 14.8 % (ref 11.5–15.5)
WBC: 11.1 10*3/uL — ABNORMAL HIGH (ref 4.0–10.5)
nRBC: 0.2 % (ref 0.0–0.2)

## 2021-06-04 LAB — PREPARE RBC (CROSSMATCH)

## 2021-06-04 LAB — PROTIME-INR
INR: 1.1 (ref 0.8–1.2)
Prothrombin Time: 13.7 seconds (ref 11.4–15.2)

## 2021-06-04 LAB — CBG MONITORING, ED: Glucose-Capillary: 251 mg/dL — ABNORMAL HIGH (ref 70–99)

## 2021-06-04 LAB — BASIC METABOLIC PANEL
Anion gap: 14 (ref 5–15)
BUN: 44 mg/dL — ABNORMAL HIGH (ref 8–23)
CO2: 26 mmol/L (ref 22–32)
Calcium: 9.3 mg/dL (ref 8.9–10.3)
Chloride: 97 mmol/L — ABNORMAL LOW (ref 98–111)
Creatinine, Ser: 6.9 mg/dL — ABNORMAL HIGH (ref 0.44–1.00)
GFR, Estimated: 6 mL/min — ABNORMAL LOW (ref >60)
Glucose, Bld: 268 mg/dL — ABNORMAL HIGH (ref 70–99)
Potassium: 4.4 mmol/L (ref 3.5–5.1)
Sodium: 137 mmol/L (ref 135–145)

## 2021-06-04 LAB — HEPATIC FUNCTION PANEL
ALT: 10 U/L (ref 0–44)
AST: 18 U/L (ref 15–41)
Albumin: 2.8 g/dL — ABNORMAL LOW (ref 3.5–5.0)
Alkaline Phosphatase: 43 U/L (ref 38–126)
Bilirubin, Direct: <0.1 mg/dL (ref 0.0–0.2)
Total Bilirubin: 0.4 mg/dL (ref 0.3–1.2)
Total Protein: 6.2 g/dL — ABNORMAL LOW (ref 6.5–8.1)

## 2021-06-04 LAB — APTT: aPTT: 30 seconds (ref 24–36)

## 2021-06-04 IMAGING — CT CT HEAD W/O CM
4 series · 17 of 47 positions shown, 19 images · non-contrast
Comparison: None.

CLINICAL DATA: Head injury.



[Series 2: head bone · axial · 0.41mm/px · z∈[+466,+514]mm · 4 of 71 slices shown]
[im 8/71  bone]
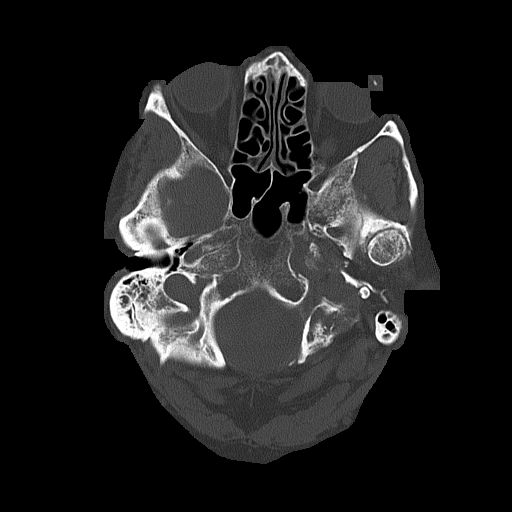
[im 15/71  bone]
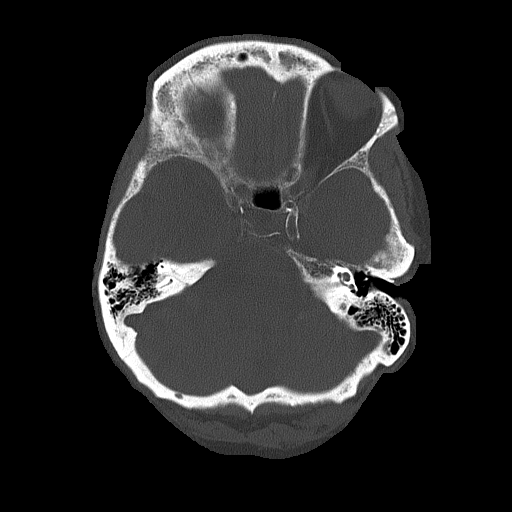
[im 22/71  bone]
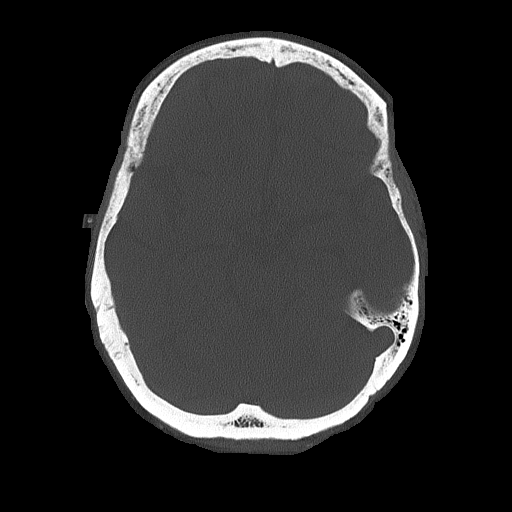
[im 32/71  bone]
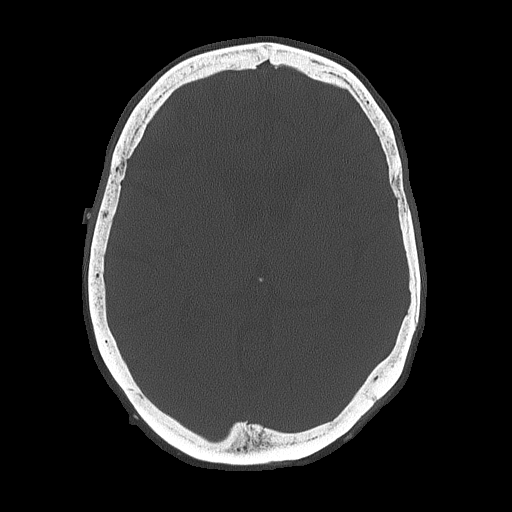

[Series 3: head wo · axial · 0.41mm/px · z∈[+467,+572]mm · 7 of 29 slices shown, 9 images]
[im 4/29  brain]
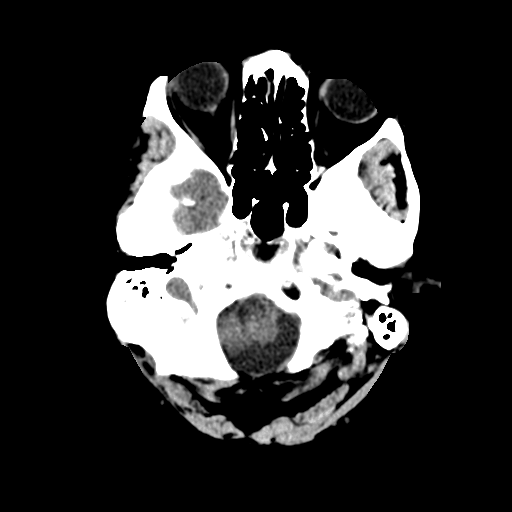
[im 4/29  bone]
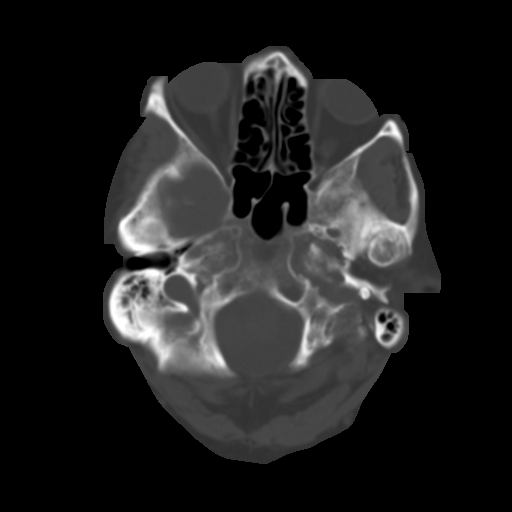
[im 8/29  brain]
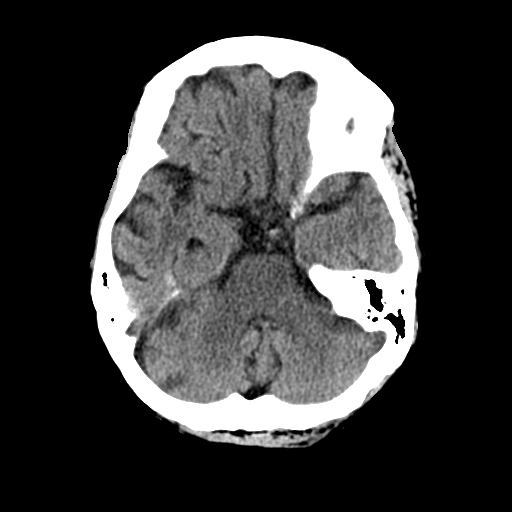
[im 11/29  brain]
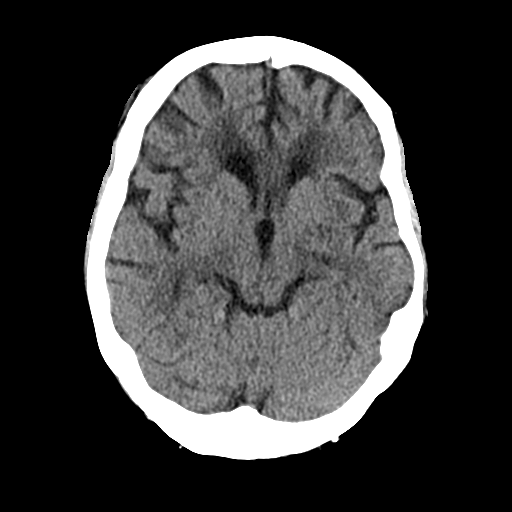
[im 15/29  brain]
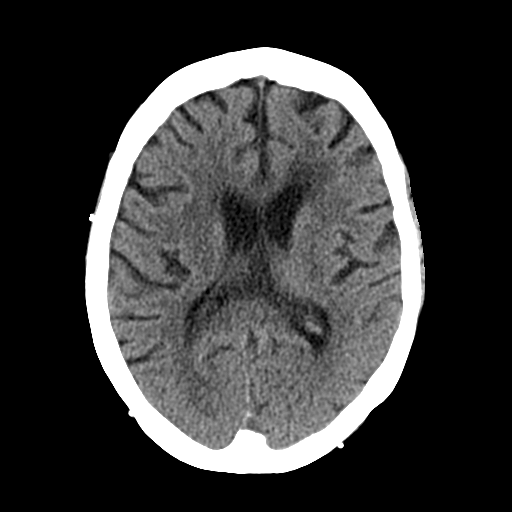
[im 18/29  brain]
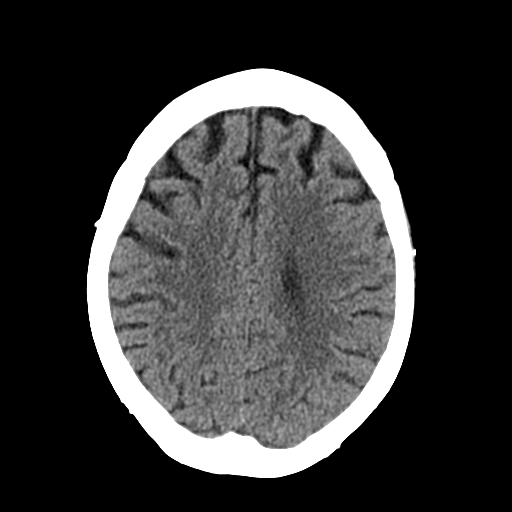
[im 18/29  bone]
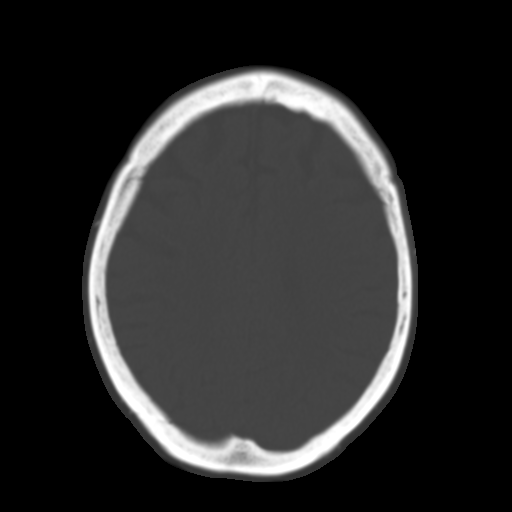
[im 22/29  brain]
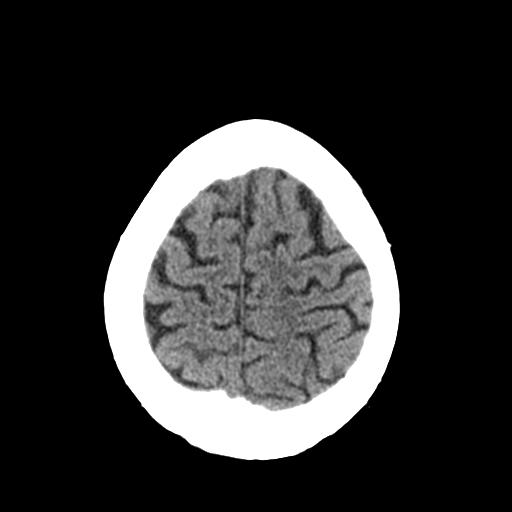
[im 25/29  brain]
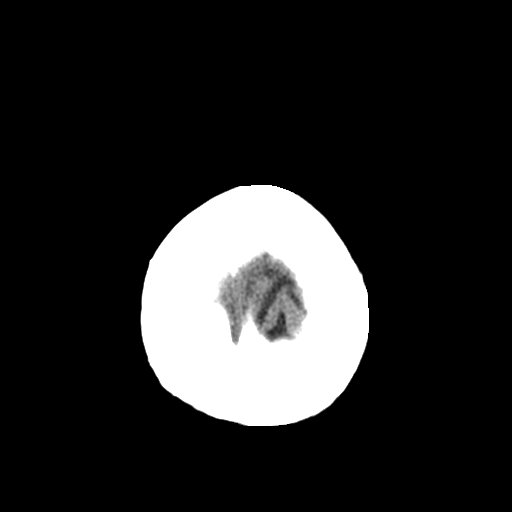

[Series 4: coronal soft tissue · coronal · 0.30mm/px · 3 of 61 slices shown]
[im 21/61  brain]
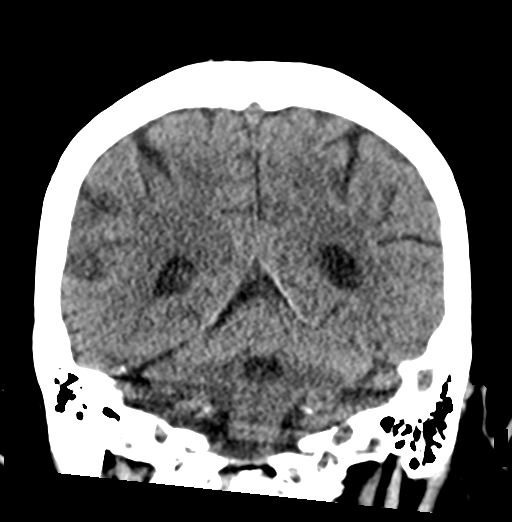
[im 27/61  brain]
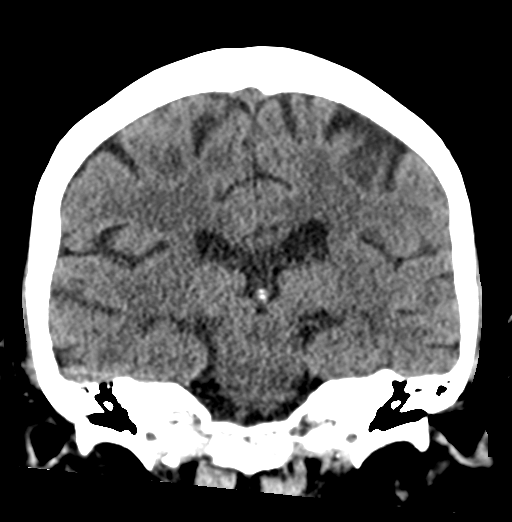
[im 34/61  brain]
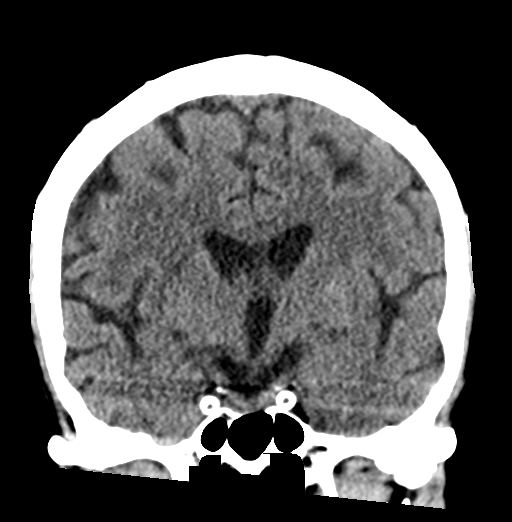

[Series 5: sagittal soft tissue · sagittal · 0.31mm/px · 3 of 52 slices shown]
[im 18/52  brain]
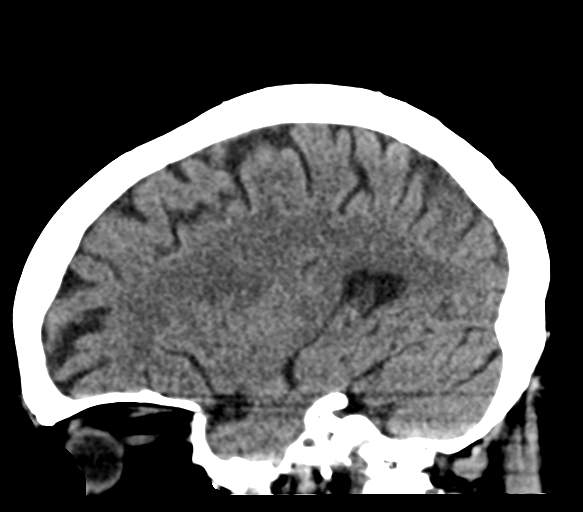
[im 26/52  brain]
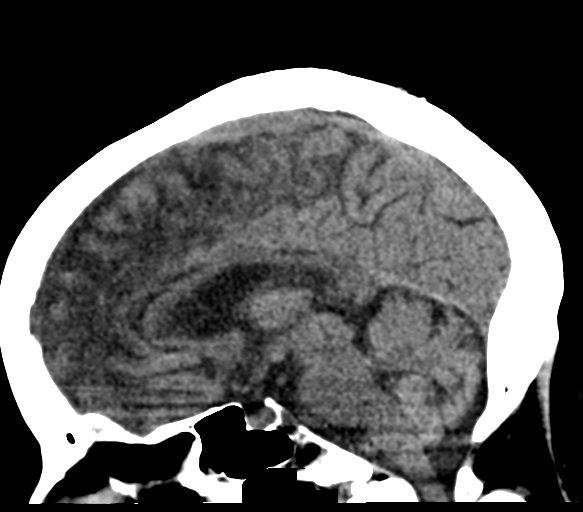
[im 35/52  brain]
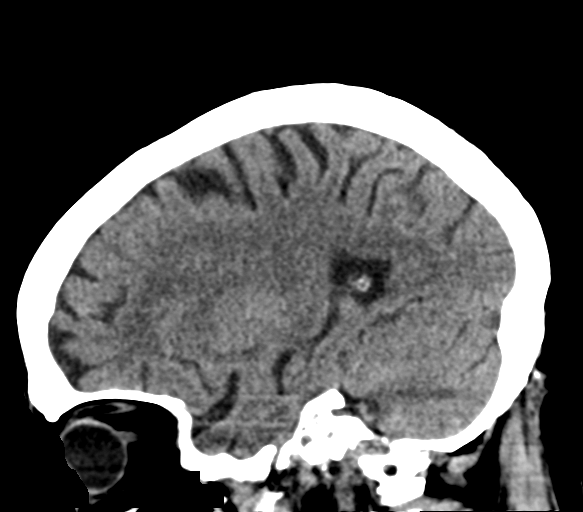

[17 of 47 positions shown; findings below may reference images not displayed]

FINDINGS: CT HEAD FINDINGS

Brain: No evidence of acute infarction, hemorrhage, hydrocephalus,
extra-axial collection or mass lesion/mass effect.

Vascular: No hyperdense vessel or unexpected calcification.

Skull: Normal. Negative for fracture or focal lesion.

Sinuses/Orbits: No acute finding.

Other: None.

CT CERVICAL SPINE FINDINGS

Alignment: Normal.

Skull base and vertebrae: No acute fracture. No primary bone lesion
or focal pathologic process.

Soft tissues and spinal canal: No prevertebral fluid or swelling. No
visible canal hematoma.

Disc levels: Moderate degenerative disc disease is noted at C3-4,
C4-5, C5-6 and C6-7.

Upper chest: Negative.

Other: Mild degenerative changes are seen involving the posterior
facet joints bilaterally.
IMPRESSION: No acute intracranial abnormality seen.

Moderate multilevel degenerative disc disease is noted in the
cervical spine. No acute abnormality is noted.

## 2021-06-04 IMAGING — CT CT CERVICAL SPINE W/O CM
3 of 4 series · 12 of 33 positions shown, 14 images · non-contrast
Comparison: None.

CLINICAL DATA: Head injury.



[Series 4: sagittal bone · sagittal · 0.27mm/px · 5 of 62 slices shown, 6 images]
[im 21/62  bone]
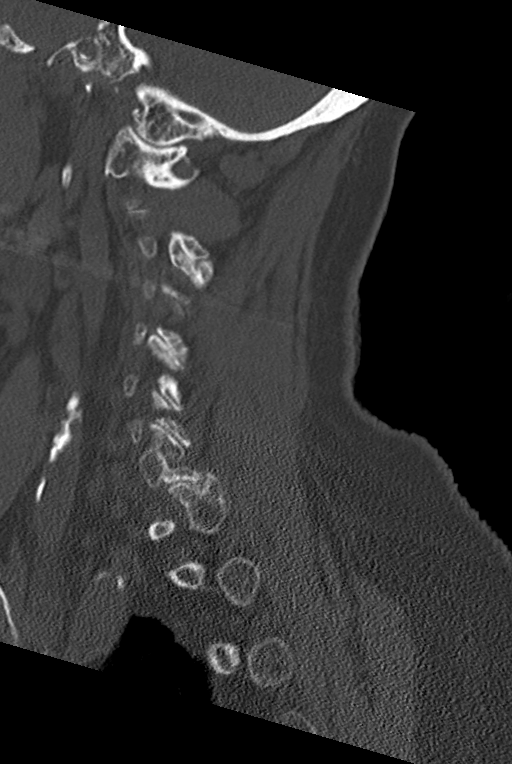
[im 26/62  bone]
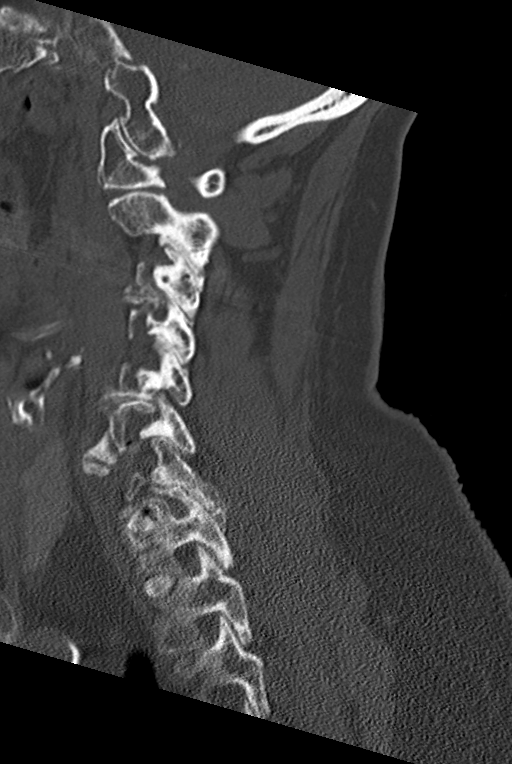
[im 31/62  soft-tissue]
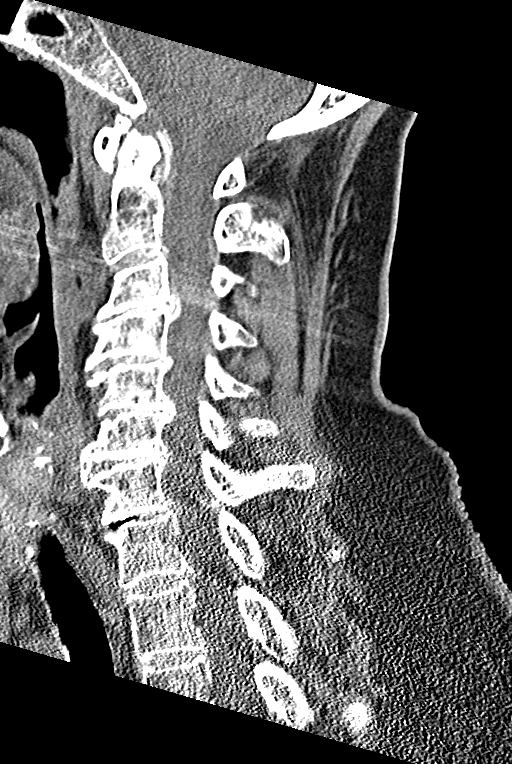
[im 31/62  bone]
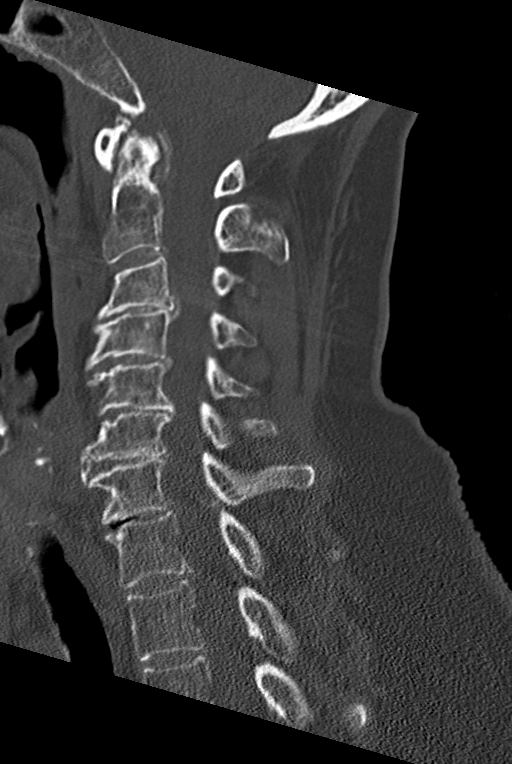
[im 36/62  bone]
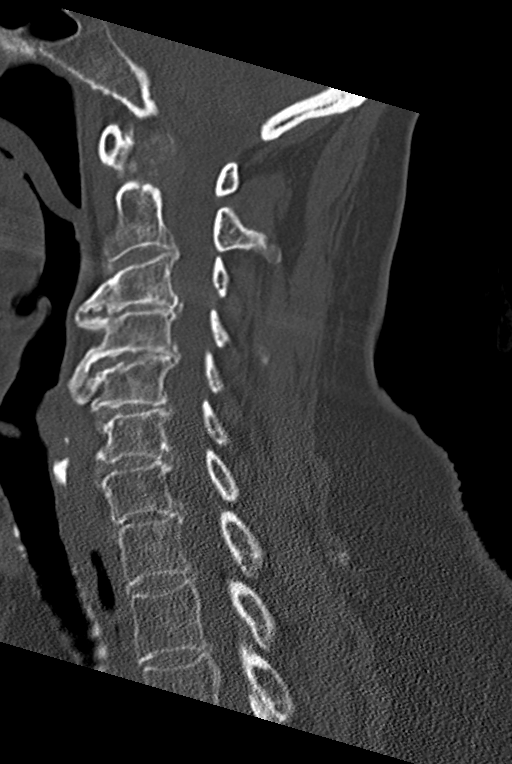
[im 41/62  bone]
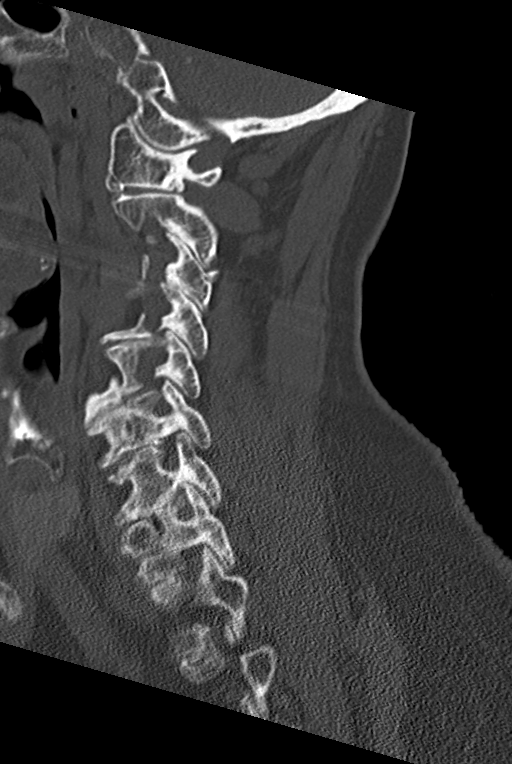

[Series 5: coronal bone · coronal · 0.24mm/px · 3 of 69 slices shown]
[im 14/69  bone]
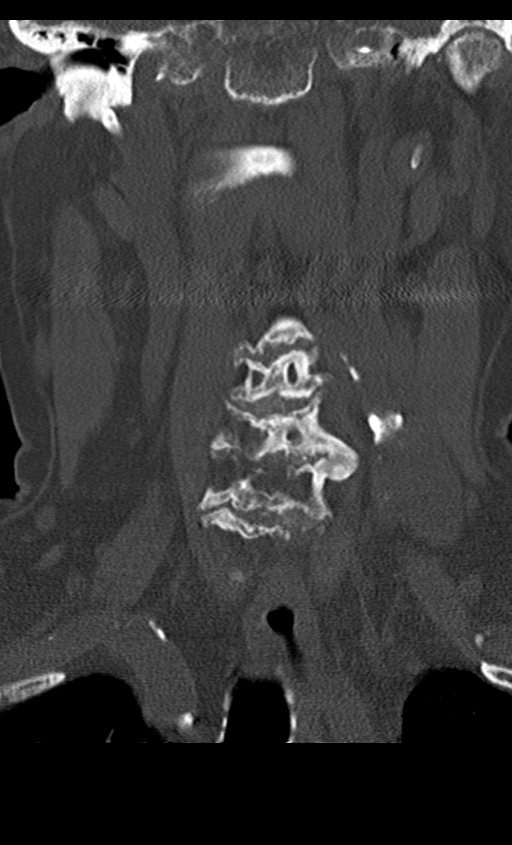
[im 28/69  bone]
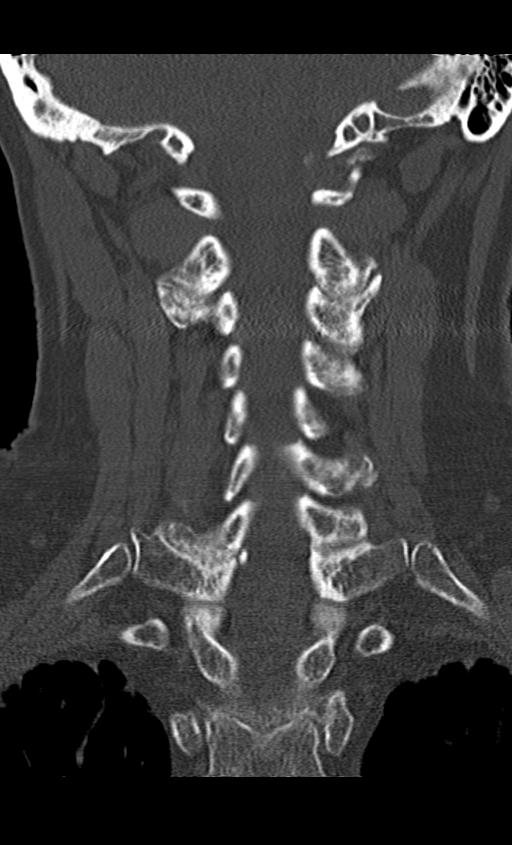
[im 41/69  bone]
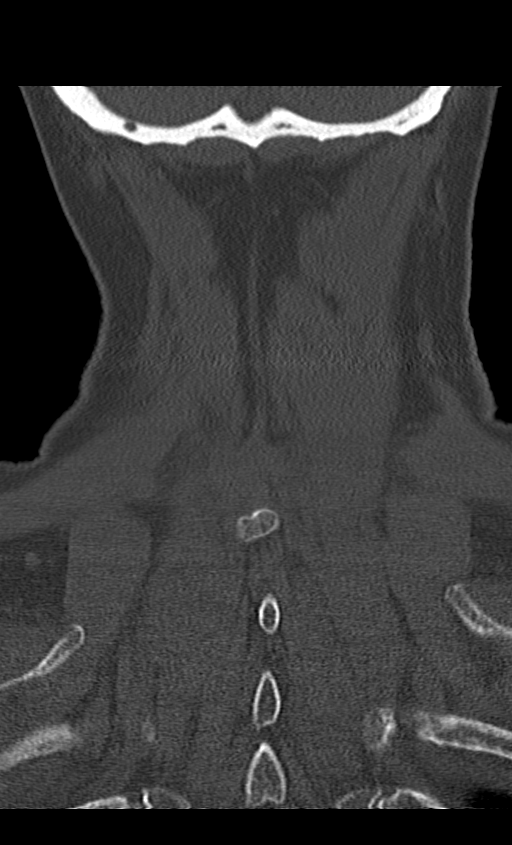

[Series 6: orthogonal bone · axial · 0.24mm/px · z∈[+286,+424]mm · 4 of 102 slices shown, 5 images]
[im 15/102  soft-tissue]
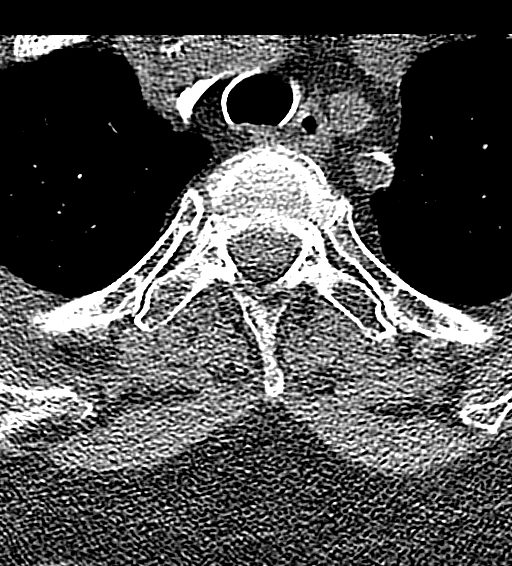
[im 15/102  bone]
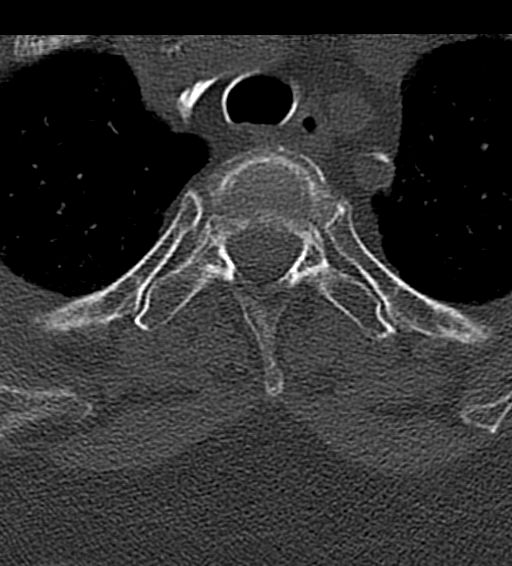
[im 44/102  bone]
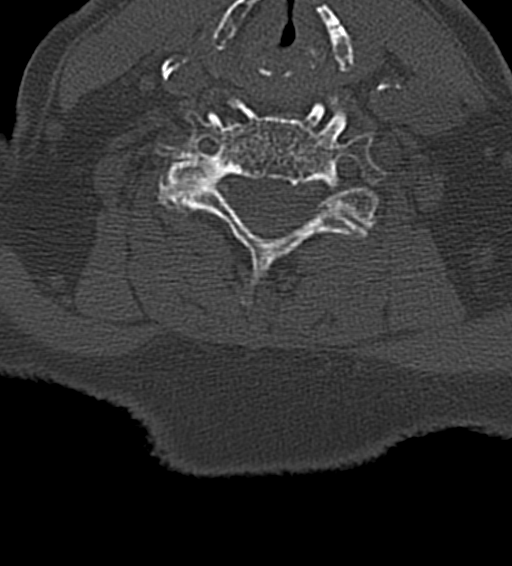
[im 58/102  bone]
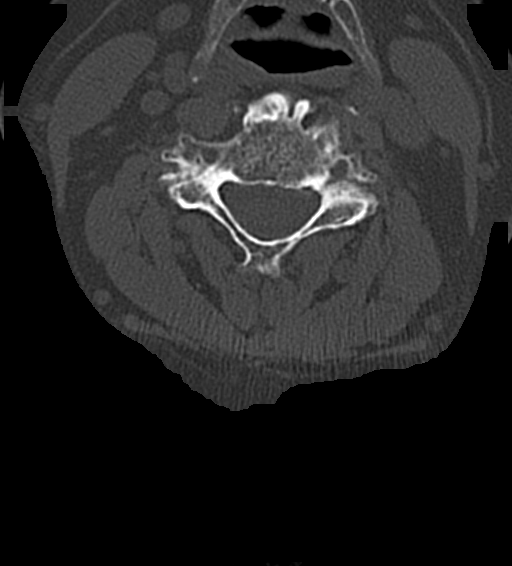
[im 87/102  bone]
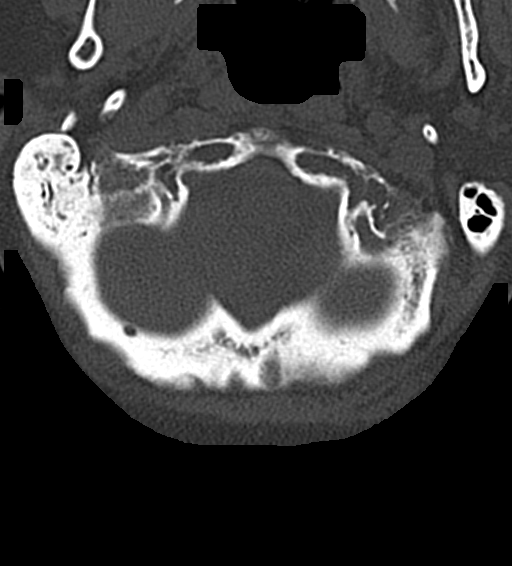

[12 of 33 positions shown; findings below may reference images not displayed]

FINDINGS: CT HEAD FINDINGS

Brain: No evidence of acute infarction, hemorrhage, hydrocephalus,
extra-axial collection or mass lesion/mass effect.

Vascular: No hyperdense vessel or unexpected calcification.

Skull: Normal. Negative for fracture or focal lesion.

Sinuses/Orbits: No acute finding.

Other: None.

CT CERVICAL SPINE FINDINGS

Alignment: Normal.

Skull base and vertebrae: No acute fracture. No primary bone lesion
or focal pathologic process.

Soft tissues and spinal canal: No prevertebral fluid or swelling. No
visible canal hematoma.

Disc levels: Moderate degenerative disc disease is noted at C3-4,
C4-5, C5-6 and C6-7.

Upper chest: Negative.

Other: Mild degenerative changes are seen involving the posterior
facet joints bilaterally.
IMPRESSION: No acute intracranial abnormality seen.

Moderate multilevel degenerative disc disease is noted in the
cervical spine. No acute abnormality is noted.

## 2021-06-04 MED ORDER — SEVELAMER CARBONATE 800 MG PO TABS
1600.0000 mg | ORAL_TABLET | Freq: Two times a day (BID) | ORAL | Status: DC
  Administered 2021-06-04 – 2021-06-05 (×3): 1600 mg via ORAL
  Filled 2021-06-04 (×4): qty 2

## 2021-06-04 MED ORDER — FAMOTIDINE 20 MG PO TABS
20.0000 mg | ORAL_TABLET | ORAL | Status: DC
  Administered 2021-06-05 – 2021-06-08 (×4): 20 mg via ORAL
  Filled 2021-06-04 (×4): qty 1

## 2021-06-04 MED ORDER — INSULIN GLARGINE-YFGN 100 UNIT/ML ~~LOC~~ SOLN
10.0000 [IU] | Freq: Every day | SUBCUTANEOUS | Status: DC
  Administered 2021-06-04 – 2021-06-07 (×4): 10 [IU] via SUBCUTANEOUS
  Filled 2021-06-04 (×4): qty 0.1

## 2021-06-04 MED ORDER — NIFEDIPINE ER OSMOTIC RELEASE 30 MG PO TB24
90.0000 mg | ORAL_TABLET | ORAL | Status: DC
  Administered 2021-06-05: 90 mg via ORAL
  Filled 2021-06-04: qty 1

## 2021-06-04 MED ORDER — CARVEDILOL 12.5 MG PO TABS
12.5000 mg | ORAL_TABLET | Freq: Two times a day (BID) | ORAL | Status: DC
  Administered 2021-06-04 – 2021-06-08 (×7): 12.5 mg via ORAL
  Filled 2021-06-04: qty 2
  Filled 2021-06-04 (×5): qty 1
  Filled 2021-06-04: qty 2

## 2021-06-04 MED ORDER — METHOCARBAMOL 500 MG PO TABS
500.0000 mg | ORAL_TABLET | Freq: Three times a day (TID) | ORAL | Status: DC | PRN
  Filled 2021-06-04: qty 1

## 2021-06-04 MED ORDER — INSULIN ASPART 100 UNIT/ML IJ SOLN
0.0000 [IU] | Freq: Three times a day (TID) | INTRAMUSCULAR | Status: DC
  Administered 2021-06-04 – 2021-06-05 (×2): 5 [IU] via SUBCUTANEOUS
  Administered 2021-06-05: 2 [IU] via SUBCUTANEOUS
  Administered 2021-06-05 – 2021-06-06 (×2): 3 [IU] via SUBCUTANEOUS
  Administered 2021-06-06: 1 [IU] via SUBCUTANEOUS
  Administered 2021-06-07 – 2021-06-08 (×4): 2 [IU] via SUBCUTANEOUS
  Filled 2021-06-04 (×10): qty 1

## 2021-06-04 MED ORDER — ACETAMINOPHEN 325 MG PO TABS
650.0000 mg | ORAL_TABLET | ORAL | Status: DC | PRN
  Administered 2021-06-07: 650 mg via ORAL
  Filled 2021-06-04: qty 2

## 2021-06-04 MED ORDER — GABAPENTIN 300 MG PO CAPS
300.0000 mg | ORAL_CAPSULE | Freq: Every day | ORAL | Status: DC
  Administered 2021-06-04 – 2021-06-07 (×4): 300 mg via ORAL
  Filled 2021-06-04 (×4): qty 1

## 2021-06-04 MED ORDER — SODIUM CHLORIDE 0.9 % IV SOLN
10.0000 mL/h | Freq: Once | INTRAVENOUS | Status: AC
  Administered 2021-06-07: 10 mL/h via INTRAVENOUS

## 2021-06-04 NOTE — ED Provider Notes (Signed)
? ?[facility] ?Provider Note ? ? ? Event Date/Time  ? First MD Initiated Contact with Patient 06/04/21 1611   ?  (approximate) ? ? ?History  ? ?Weakness ? ? ?HPI ? ?Misty [name] is a 81 y.o. female who comes in complaining of feeling weak and lightheaded.  She reports bright red blood coming out of her rectum.  This happened yesterday and again last night.  She was given some hemorrhoidal cream but there is still blood coming out.  Hemoglobin was 8 yesterday at 7 today. ? ?  ? ? ?Physical Exam  ? ?Triage Vital Signs: ?ED Triage Vitals  ?Enc Vitals Group  ?   BP 06/04/21 1426 (!) 112/51  ?   Pulse Rate 06/04/21 1426 90  ?   Resp 06/04/21 1426 16  ?   Temp 06/04/21 1426 98.4 ?F (36.9 ?C)  ?   Temp Source 06/04/21 1426 Oral  ?   SpO2 06/04/21 1426 97 %  ?   Weight --   ?   Height 06/04/21 1419 '5\' 3"'$  (1.6 m)  ?   Head Circumference --   ?   Peak Flow --   ?   Pain Score 06/04/21 1418 4  ?   Pain Loc --   ?   Pain Edu? --   ?   Excl. in [place]? --   ? ? ?Most recent vital signs: ?Vitals:  ? 06/04/21 1830 06/04/21 2230  ?BP: (!) 135/48 (!) 135/55  ?Pulse: 76 (!) 106  ?Resp: 13 17  ?Temp:  98.1 ?F (36.7 ?C)  ?SpO2: 99% 99%  ? ? ? ?General: Awake, alert, no distress.  ?CV:  Good peripheral perfusion.  Heart regular rate and rhythm no audible murmur ?Resp:  Normal effort.  Lungs are clear ?Abd:  No distention.  Soft and nontender patient gets peritoneal dialysis this is ongoing ?Extremities: No edema ?Rectal: No current palpable or visualized visualized hemorrhoid.  There is some blood on my finger when I do the rectal exam no masses are palpated. ? ? ?ED Results / Procedures / Treatments  ? ?Labs ?(all labs ordered are listed, but only abnormal results are displayed) ?Labs Reviewed  ?BASIC METABOLIC PANEL - Abnormal; Notable for the following components:  ?    Result Value  ? Chloride 97 (*)   ? Glucose, Bld 268 (*)   ? BUN 44 (*)   ? Creatinine, Ser 6.90 (*)   ? GFR, Estimated 6 (*)   ? All other  components within normal limits  ?CBC - Abnormal; Notable for the following components:  ? WBC 11.1 (*)   ? RBC 2.22 (*)   ? Hemoglobin 7.0 (*)   ? HCT 21.9 (*)   ? Platelets 405 (*)   ? All other components within normal limits  ?HEPATIC FUNCTION PANEL - Abnormal; Notable for the following components:  ? Total Protein 6.2 (*)   ? Albumin 2.8 (*)   ? All other components within normal limits  ?CBG MONITORING, ED - Abnormal; Notable for the following components:  ? Glucose-Capillary 251 (*)   ? All other components within normal limits  ?PROTIME-INR  ?APTT  ?URINALYSIS, ROUTINE W REFLEX MICROSCOPIC  ?HEMOGLOBIN  ?BASIC METABOLIC PANEL  ?CBC  ?HEMOGLOBIN A1C  ?HEMOGLOBIN  ?HEMOGLOBIN  ?HEMOGLOBIN  ?POC OCCULT BLOOD, ED  ?TYPE AND SCREEN  ?PREPARE RBC (CROSSMATCH)  ? ? ? ?EKG ? ? ? ? ?RADIOLOGY ? ? ? ?PROCEDURES: ? ?Critical Care performed:  ? ?  Procedures ? ? ?MEDICATIONS ORDERED IN ED: ?Medications  ?0.9 %  sodium chloride infusion (has no administration in time range)  ?acetaminophen (TYLENOL) tablet 650 mg (has no administration in time range)  ?carvedilol (COREG) tablet 12.5 mg (12.5 mg Oral Given 06/04/21 2155)  ?NIFEdipine (PROCARDIA-XL/NIFEDICAL-XL) 24 hr tablet 90 mg (has no administration in time range)  ?famotidine (PEPCID) tablet 20 mg (has no administration in time range)  ?sevelamer carbonate (RENVELA) tablet 1,600 mg (1,600 mg Oral Given 06/04/21 2041)  ?gabapentin (NEURONTIN) capsule 300 mg (300 mg Oral Given 06/04/21 2155)  ?methocarbamol (ROBAXIN) tablet 500 mg (has no administration in time range)  ?insulin aspart (novoLOG) injection 0-9 Units (5 Units Subcutaneous Given 06/04/21 2050)  ?insulin glargine-yfgn [facility]) injection 10 Units (10 Units Subcutaneous Given 06/04/21 2155)  ? ? ? ?IMPRESSION / MDM / ASSESSMENT AND PLAN / ED COURSE  ?I reviewed the triage vital signs and the nursing notes. ?Patient with history of colorectal cancer and has had several negative colonoscopies in the past.  She reports  she cannot tolerate the GoLytely prep.  She has another prep which she has used in the past with success. ? ?Differential diagnosis includes, but is not limited to various causes for her GI bleed including AV malformation or polyp or return of her colorectal cancer.  It is possible she also has an upper GI bleed due to his massive.  She is currently stable and is not having very much bleeding going on currently.  She does meet the criteria for transfusion and I will order blood for her.  She consents to the blood ?The patient is on the cardiac monitor to evaluate for evidence of arrhythmia and/or significant heart rate changes.* ?Patient does have a history of renal failure and anemia. ? ? ?FINAL CLINICAL IMPRESSION(S) / ED DIAGNOSES  ? ?Final diagnoses:  ?Lower GI bleed  ?Symptomatic anemia  ? ? ? ?Rx / DC Orders  ? ?ED Discharge Orders   ? ? None  ? ?  ? ? ? ?Note:  This document was prepared using Dragon voice recognition software and may include unintentional dictation errors. ?  ?Nena Polio, MD ?06/04/21 2346 ? ?

## 2021-06-04 NOTE — Assessment & Plan Note (Signed)
Recent echocardiogram was performed on 06/2020, ejection fraction 50 to 55%.  Patient currently does not have volume overload.  Continue to follow closely. ?

## 2021-06-04 NOTE — Progress Notes (Addendum)
Update 2242: Second blood of blood transfusion running at this time. Will continue to monitor. ? ? ?Update 2357: Unable to draw hemoglobin, second unit of blood is running at this time. NP Foust made aware but no new orders. Will continue to monitor. ? ?Update 0133: Second blood transfusion completed. Will continue to monitor. ?

## 2021-06-04 NOTE — H&P (Signed)
?History and Physical  ? ? ?Patient: Misty [name] AJO:[id number] DOB: 1941-02-25 ?DOA: 06/04/2021 ?DOS: the patient was seen and examined on 06/04/2021 ?PCP: Gayland Curry, MD  ?Patient coming from: Home ? ?Chief Complaint:  ?Chief Complaint  ?Patient presents with  ? Weakness  ?and rectal bleeding. ?HPI: Misty [name] is a 81 y.o. female with medical history significant of colon cancer, end-stage renal disease peritoneal dialysis, type 2 diabetes, essential hypertension, chronic diastolic congestive heart failure, history of TIA who present to the hospital with a rectal bleeding and generalized weakness. ? ?Patient normally walks with a walker, on a walk a few steps, she has frequent falls due to weakness.  She started have rectal bleeding since Sunday, it was fresh red blood mixed with clots.  She did not have any abdominal pain.  Her last bowel movement was Saturday which was normal.  At the same time, patient was also nauseous and vomited a few times, her emesis was not bloody or black. ?Patient also has some dizziness and increased weakness. ? ?Upon arriving the hospital, patient temperature was 98.4, heart rate 90, respirate 16, blood pressure 112/51, oxygen saturation 97% on room air. ?Her hemoglobin was 7.0, platelets 405, creatinine 6.9, glucose 260. ?Emergency room physician has ordered a blood transfusion. ? ? ?Review of Systems: As mentioned in the history of present illness. All other systems reviewed and are negative. ?Past Medical History:  ?Diagnosis Date  ? Anemia of chronic renal failure   ? Aortic valve stenosis 07/02/2017  ? a.) TTE 07/02/2017: 55-60%; mild AS with MPG 13.0 mmHg. b.) TTE 10/14/2019: EF >55%; MPG 23.0 mmHg. c.) TTE 06/23/2020: EF 50-55%; MPG 19.7 mmHg. d.) TTE 11/27/2020: EF >55%; MPG 18.0 mmHg.  ? Chronic lower back pain   ? Complication of anesthesia   ? a.) severe myalgias with succinylcholine use. b.) (+) difficult airway.  ? Diastolic dysfunction 94/70/9628  ? a.)  TTE 07/02/2017; EF 55-60%; mild AR; G1DD. b.) TTE 06/23/2020: EF 50-55% with mild LVH; mild to mod LA enlargement; mild AR, mild to mod MR; G2DD.  ? Difficult airway for intubation   ? a.) 08/08/2020 at [facility] --> Initial attempt with Sabra Heck 3, larynx was "rigid/fixed"; unable to visualize anything but epiglottis. Glidescope utilized to obtain a great view with VL. First attempt with Glidescope unsuccessful d/t anterior orientation. ETT withdrawn and a greater bend was placed. Second attempt was successful with effort. Patient sustained lip laceration during intubation attempts.  ? 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 a.) TTE 11/03/2019: measured 3.7 cm  ? ESRD on peritoneal dialysis [facility])   ? GERD (gastroesophageal reflux disease)   ? Glaucoma   ? Heart murmur   ? HLD (hyperlipidemia)   ? Hyperparathyroidism ([place])   ? a.) s/p total parathyroidectomy  ? Hypertension   ? Hypertensive retinopathy   ? Lumbar radiculopathy   ? MGUS (monoclonal gammopathy of unknown significance)   ? Motion sickness   ? Nonproliferative diabetic retinopathy ([place])   ? Osteoarthritis   ? Peritoneal dialysis catheter in place [facility])   ? Personal history of chemotherapy   ? Personal history of radiation therapy   ? Rectal cancer ([facility]) 2011  ? a.) Clinical stage T3N0. b.) s/p 3 cycles of neoadjuvant CI 5FU + XRT c.) s/p resection on 08/08/2009.  ? Spinal stenosis of lumbar region with neurogenic claudication   ? T2DM (type 2 diabetes mellitus) ([place])   ? TIA (transient ischemic attack)   ? ?Past Surgical History:  ?  Procedure Laterality Date  ? BREAST BIOPSY Left   ? bx/ clip-neg  ? COLONOSCOPY N/A 05/03/2020  ? Procedure: COLONOSCOPY;  Surgeon: Lesly Rubenstein, MD;  Location: [facility] ENDOSCOPY;  Service: Endoscopy;  Laterality: N/A;  ? coloretcal cancer    ? DIALYSIS/PERMA CATHETER REMOVAL N/A 10/01/2020  ? Procedure: DIALYSIS/PERMA CATHETER REMOVAL;  Surgeon: Algernon Huxley, MD;  Location: [place] CV LAB;  Service:  Cardiovascular;  Laterality: N/A;  ? LUMBAR LAMINECTOMY/DECOMPRESSION MICRODISCECTOMY Right 01/23/2021  ? Procedure: RIGHT L5-S1 DECOMPRESSION;  Surgeon: Meade Maw, MD;  Location: [facility] ORS;  Service: Neurosurgery;  Laterality: Right;  ? thyroid gland    ? parathyroid  ? TUBAL LIGATION    ? ?Social History:  reports that she has never smoked. She has never used smokeless tobacco. She reports that she does not drink alcohol and does not use drugs. ? ?Allergies  ?Allergen Reactions  ? Succinylcholine Other (See Comments)  ?  Severe myalgias  ? Penicillins Rash  ?  Tolerated cefazolin prior  ? ? ?Family History  ?Problem Relation Age of Onset  ? Lung disease Sister   ? Diabetes Mellitus II Paternal Grandmother   ? Breast cancer Neg Hx   ? ? ?Prior to Admission medications   ?Medication Sig Start Date End Date Taking? Authorizing Provider  ?acetaminophen (TYLENOL) 325 MG tablet Take 650 mg by mouth every 4 (four) hours as needed for moderate pain.    [provider]  ?carvedilol (COREG) 12.5 MG tablet Take 12.5 mg by mouth 2 (two) times daily with a meal.    [provider]  ?carvedilol (COREG) 6.25 MG tablet Take 1 tablet (6.25 mg total) by mouth 2 (two) times daily with a meal. ?Patient not taking: No sig reported 06/23/20   Lorella Nimrod, MD  ?cetirizine (ZYRTEC) 10 MG tablet Take 10 mg by mouth every morning.    [provider]  ?Cholecalciferol (VITAMIN D) 50 MCG (2000 UT) tablet Take 2,000 Units by mouth every Monday, Wednesday, and Friday.    [provider]  ?colestipol (COLESTID) 1 g tablet Take 1 g by mouth 2 (two) times daily.  10/04/18 01/23/21  [provider]  ?famotidine (PEPCID) 20 MG tablet Take 20 mg by mouth every morning.    [provider]  ?Fiber Diet TABS Take 2 tablets by mouth 2 (two) times daily.    [provider]  ?furosemide (LASIX) 40 MG tablet Take 1 tablet (40 mg total) by mouth daily. 06/23/20   Lorella Nimrod, MD   ?gabapentin (NEURONTIN) 300 MG capsule Take 1 capsule (300 mg total) by mouth at bedtime. ?Patient taking differently: Take 300 mg by mouth 2 (two) times daily. 06/23/20   Lorella Nimrod, MD  ?glucose blood test strip 1 each by Finger Stick route 4 times daily (after meals and nightly). Use as directed 12/24/17   [provider]  ?HUMALOG 100 UNIT/ML injection Inject 4-12 Units into the skin 4 (four) times daily as needed (blood sugar of 150 or above). 05/20/18   [provider]  ?hydrALAZINE (APRESOLINE) 50 MG tablet Take 1 tablet (50 mg total) by mouth every 8 (eight) hours. ?Patient taking differently: Take 50 mg by mouth in the morning and at bedtime. 06/23/20   Lorella Nimrod, MD  ?insulin glargine (LANTUS) 100 UNIT/ML injection Inject 0.1 mLs (10 Units total) into the skin 2 (two) times daily. ?Patient taking differently: Inject 20 Units into the skin 2 (two) times daily. 05/04/20   Fritzi Mandes,  MD  ?INSULIN SYRINGE 1CC/29G 29G X 1/2" 1 ML MISC     [provider]  ?lidocaine (LIDODERM) 5 % Place 1 patch onto the skin daily as needed (pain).    [provider]  ?methocarbamol (ROBAXIN) 500 MG tablet Take 1 tablet (500 mg total) by mouth every 8 (eight) hours as needed for muscle spasms. 01/23/21   Loleta Dicker, PA  ?multivitamin (RENA-VIT) TABS tablet Take 1 tablet by mouth every morning. 05/22/20   [provider]  ?NIFEdipine (PROCARDIA XL/NIFEDICAL-XL) 90 MG 24 hr tablet Take 90 mg by mouth every morning. 05/22/20   [provider]  ?senna (SENOKOT) 8.6 MG TABS tablet Take 1 tablet (8.6 mg total) by mouth daily as needed for mild constipation. 01/23/21   Loleta Dicker, PA  ?sevelamer carbonate (RENVELA) 800 MG tablet Take 1,600 mg by mouth 2 (two) times daily with a meal.    [provider]  ?[place] X 8 MM [place]  08/28/18   [provider]  ? ? ?Physical Exam: ?Vitals:  ? 06/04/21 1419 06/04/21 1426  ?BP:  (!)  112/51  ?Pulse:  90  ?Resp:  16  ?Temp:  98.4 ?F (36.9 ?C)  ?TempSrc:  Oral  ?SpO2:  97%  ?Height: '5\' 3"'$  (1.6 m)   ? ?Physical Exam ?Constitutional:   ?   General: She is not in acute distress. ?   Appearance: She is ill-a

## 2021-06-04 NOTE — Assessment & Plan Note (Signed)
Patient had a rectal bleeding since Sunday.  She also had a nausea and vomiting at the same time when she had a rectal bleeding.  At that time, her emesis was nonbloody.  This is ruled out upper GI bleed.  Patient had a history of colon cancer, she had a colonoscopy 3 years ago, did not have any recurrence of cancer. ?Etiology of rectal bleeding is unclear. ?Emergency physician has spoken with Dr. Vicente Males for consult.  We will follow closely. ?

## 2021-06-04 NOTE — Assessment & Plan Note (Signed)
Patient has end-stage renal disease, currently on peritoneal dialysis nightly.  We will consult nephrology for continued dialysis. ?

## 2021-06-04 NOTE — Assessment & Plan Note (Addendum)
Patient has elevated glucose today, will continue sliding scale insulin for now.  Also started lower dose insulin glargine.  Monitor glucose. ?

## 2021-06-04 NOTE — Assessment & Plan Note (Signed)
Patient has a history of multiple myeloma, will be followed by her PCP as outpatient ?

## 2021-06-04 NOTE — ED Notes (Signed)
Pt soiled with bloody leakage from anus. Daughter states she will clean pt and change sheets/chux if given the supplies. Everything needed given to daughter. Daughter verbalizes she will ask questions or for any help ?

## 2021-06-04 NOTE — ED Triage Notes (Signed)
Pt to ED via POV with c/o weakness and bright red blood coming out her rectum, she went to [facility] yesterday for this and they gave her hemorrhoid cream but husband states that it is still a lot coming out of her. She is feeling weak also. They told her that her HGB was 8 and told her to come ED to be evaluated.   ?

## 2021-06-04 NOTE — Assessment & Plan Note (Signed)
Patient baseline hb was 9-10.  Dropped down to 7 today due to GI bleed.  ED has ordered 1 unit of blood transfusion, will continue monitor hemoglobin every 8 hours, transfuse as needed. ?

## 2021-06-04 NOTE — Assessment & Plan Note (Signed)
Continue beta-blocker and nifedipine.  Monitor blood pressure ?

## 2021-06-04 NOTE — ED Notes (Signed)
Pt NAD in bed, pale, a/ox4. Pt states she has been feeling weak with dizziness on ambulating. Pt also reports bright red rectal bleeding x 2 days. Denies ABD pain, n/v/d, CP, SOB.  ?

## 2021-06-04 NOTE — ED Notes (Signed)
Patient transported to CT 

## 2021-06-05 ENCOUNTER — Inpatient Hospital Stay: Payer: Medicare Other

## 2021-06-05 DIAGNOSIS — K922 Gastrointestinal hemorrhage, unspecified: Secondary | ICD-10-CM | POA: Diagnosis not present

## 2021-06-05 DIAGNOSIS — D62 Acute posthemorrhagic anemia: Secondary | ICD-10-CM | POA: Diagnosis not present

## 2021-06-05 LAB — CBC
HCT: 28.9 % — ABNORMAL LOW (ref 36.0–46.0)
Hemoglobin: 9.8 g/dL — ABNORMAL LOW (ref 12.0–15.0)
MCH: 30.2 pg (ref 26.0–34.0)
MCHC: 33.9 g/dL (ref 30.0–36.0)
MCV: 88.9 fL (ref 80.0–100.0)
Platelets: 285 10*3/uL (ref 150–400)
RBC: 3.25 MIL/uL — ABNORMAL LOW (ref 3.87–5.11)
RDW: 16.9 % — ABNORMAL HIGH (ref 11.5–15.5)
WBC: 13 10*3/uL — ABNORMAL HIGH (ref 4.0–10.5)
nRBC: 0 % (ref 0.0–0.2)

## 2021-06-05 LAB — BASIC METABOLIC PANEL
Anion gap: 11 (ref 5–15)
BUN: 48 mg/dL — ABNORMAL HIGH (ref 8–23)
CO2: 25 mmol/L (ref 22–32)
Calcium: 8.7 mg/dL — ABNORMAL LOW (ref 8.9–10.3)
Chloride: 96 mmol/L — ABNORMAL LOW (ref 98–111)
Creatinine, Ser: 6.78 mg/dL — ABNORMAL HIGH (ref 0.44–1.00)
GFR, Estimated: 6 mL/min — ABNORMAL LOW (ref >60)
Glucose, Bld: 271 mg/dL — ABNORMAL HIGH (ref 70–99)
Potassium: 4 mmol/L (ref 3.5–5.1)
Sodium: 132 mmol/L — ABNORMAL LOW (ref 135–145)

## 2021-06-05 LAB — HEMOGLOBIN AND HEMATOCRIT, BLOOD
HCT: 22.4 % — ABNORMAL LOW (ref 36.0–46.0)
Hemoglobin: 7.5 g/dL — ABNORMAL LOW (ref 12.0–15.0)

## 2021-06-05 LAB — GLUCOSE, CAPILLARY
Glucose-Capillary: 177 mg/dL — ABNORMAL HIGH (ref 70–99)
Glucose-Capillary: 360 mg/dL — ABNORMAL HIGH (ref 70–99)

## 2021-06-05 LAB — HEMOGLOBIN
Hemoglobin: 8.5 g/dL — ABNORMAL LOW (ref 12.0–15.0)
Hemoglobin: 8.7 g/dL — ABNORMAL LOW (ref 12.0–15.0)
Hemoglobin: 7.5 g/dL — ABNORMAL LOW (ref 12.0–15.0)

## 2021-06-05 LAB — HEMOGLOBIN A1C
Hgb A1c MFr Bld: 6.6 % — ABNORMAL HIGH (ref 4.8–5.6)
Mean Plasma Glucose: 142.72 mg/dL

## 2021-06-05 LAB — CBG MONITORING, ED
Glucose-Capillary: 285 mg/dL — ABNORMAL HIGH (ref 70–99)
Glucose-Capillary: 207 mg/dL — ABNORMAL HIGH (ref 70–99)

## 2021-06-05 IMAGING — CT CTA GI BLEED
2 of 16 series · 10 of 46 positions shown, 15 images · IV contrast (APPLIED)
Comparison: None.

CLINICAL DATA: Evaluate for lower GI bleed. History of rectal
bleeding since [REDACTED].

EXAM:
CTA ABDOMEN AND PELVIS WITHOUT AND WITH CONTRAST
TECHNIQUE: Multidetector CT imaging of the abdomen and pelvis was performed
using the standard protocol during bolus administration of
intravenous contrast. Multiplanar reconstructed images and MIPs were
obtained and reviewed to evaluate the vascular anatomy.

[Series 11: venous thins · axial · portal-venous · 0.73mm/px · z∈[+1011,+1404]mm · 9 of 1204 slices shown, 13 images]
[im 110/1204  soft-tissue]
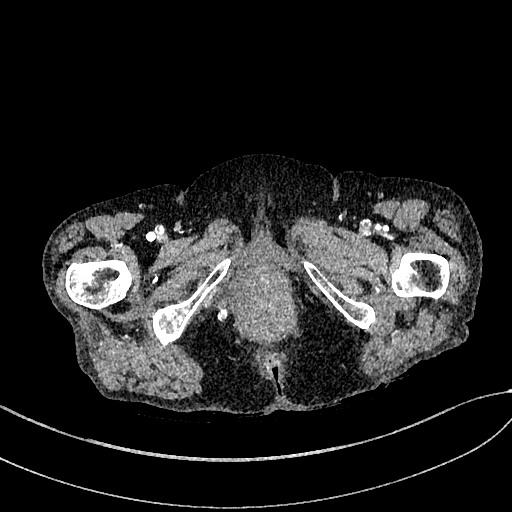
[im 110/1204  bone]
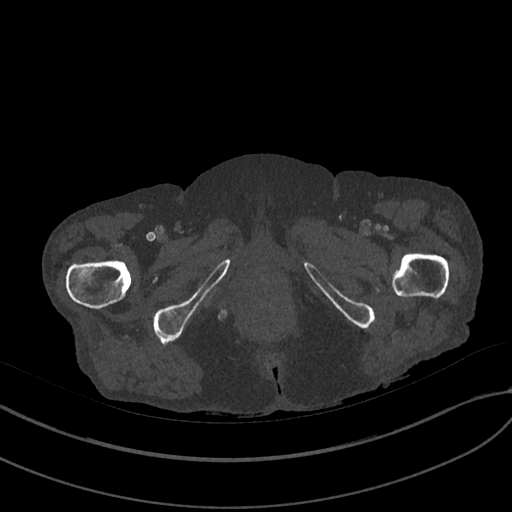
[im 219/1204  soft-tissue]
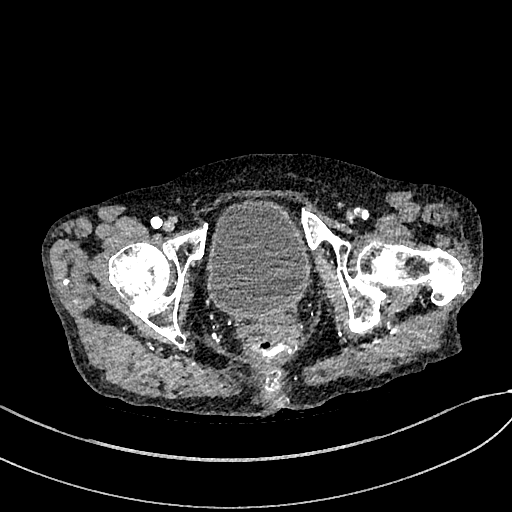
[im 438/1204  soft-tissue]
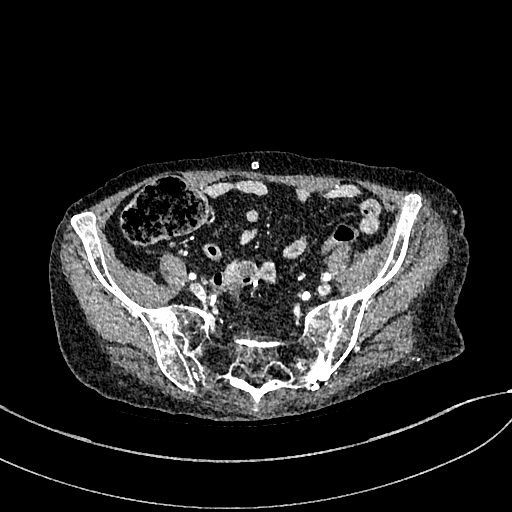
[im 547/1204  soft-tissue]
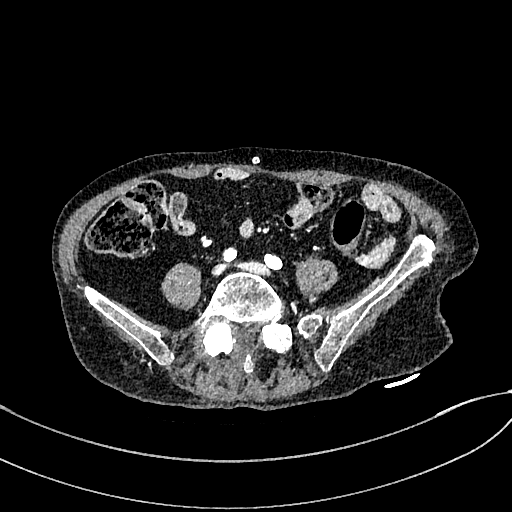
[im 657/1204  soft-tissue]
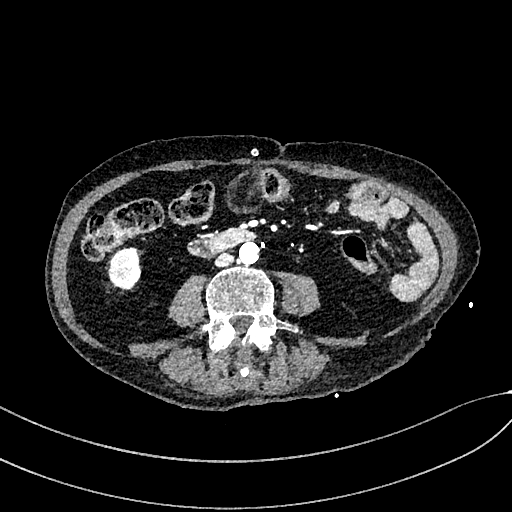
[im 766/1204  soft-tissue]
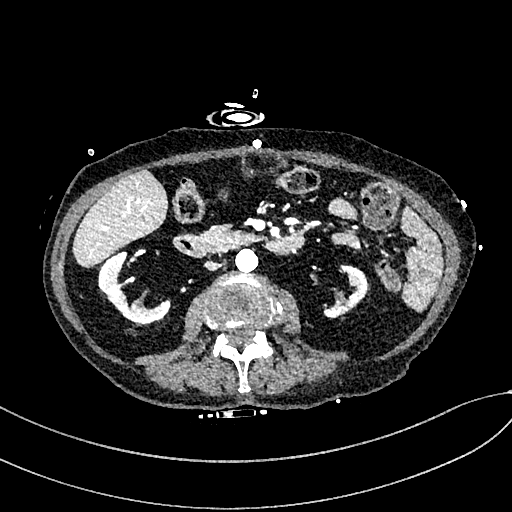
[im 766/1204  lung]
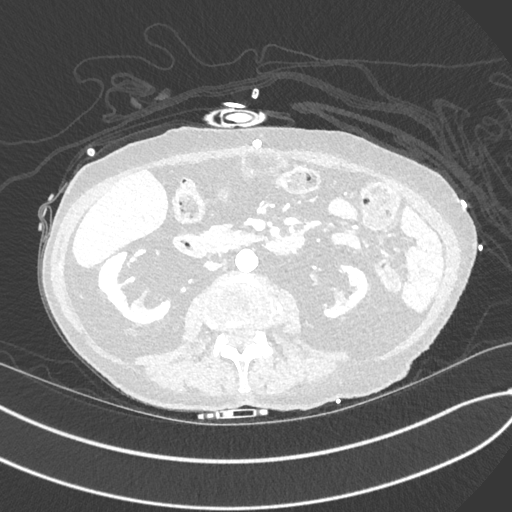
[im 875/1204  lung]
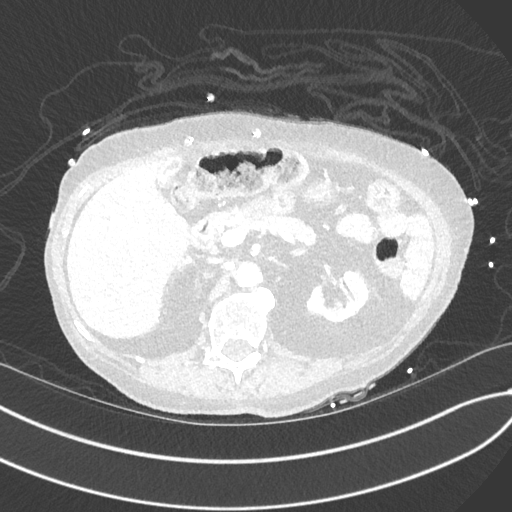
[im 985/1204  soft-tissue]
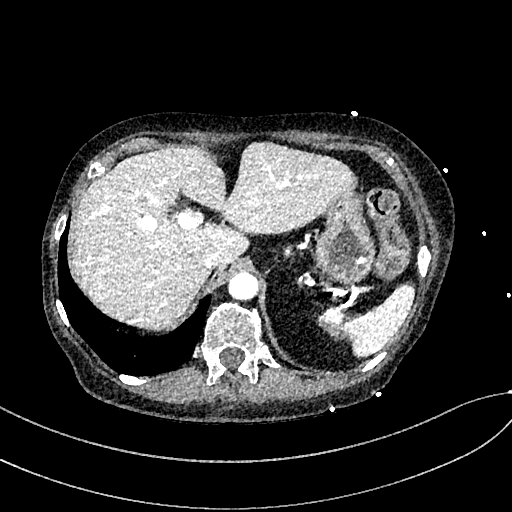
[im 985/1204  lung]
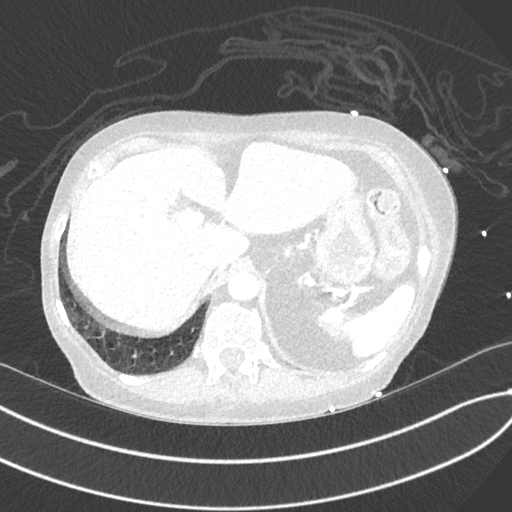
[im 1094/1204  soft-tissue]
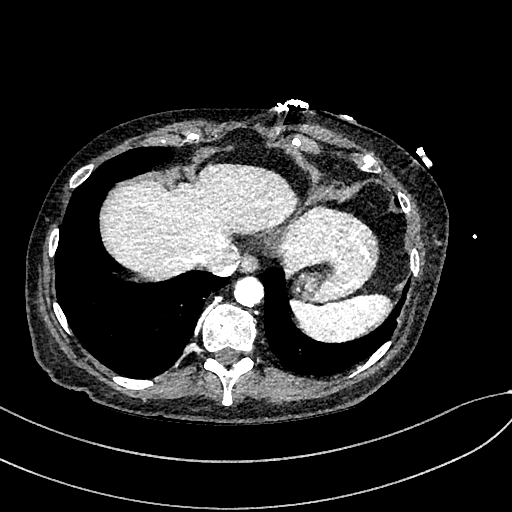
[im 1094/1204  lung]
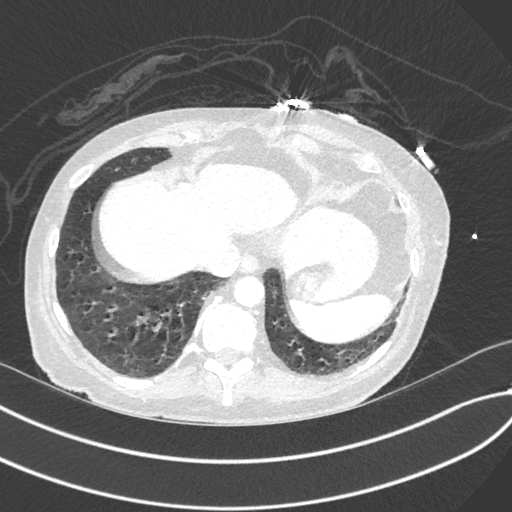

[Series 15: cor · coronal · 0.70mm/px · 1 of 116 slices shown, 2 images]
[im 58/116  soft-tissue]
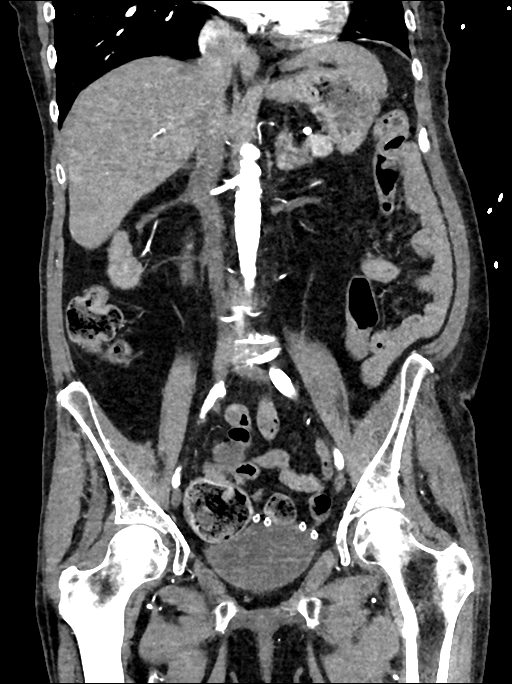
[im 58/116  bone]
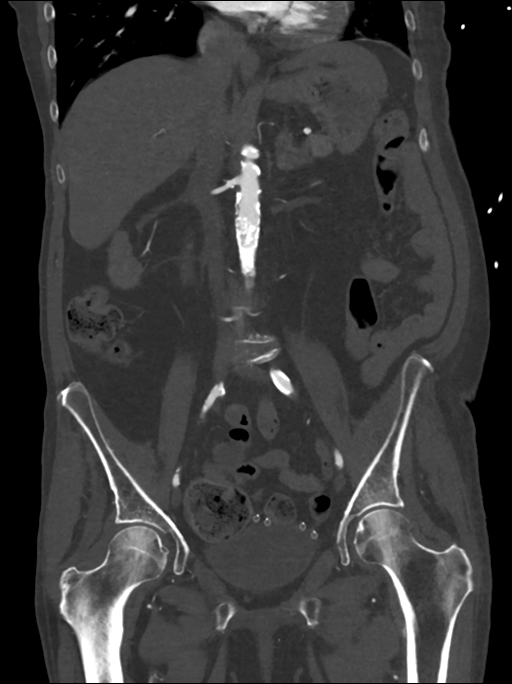

[10 of 46 positions shown; findings below may reference images not displayed]

RADIATION DOSE REDUCTION: This exam was performed according to the
departmental dose-optimization program which includes automated
exposure control, adjustment of the mA and/or kV according to
patient size and/or use of iterative reconstruction technique.

CONTRAST:  80mL OMNIPAQUE IOHEXOL 350 MG/ML SOLN
FINDINGS: VASCULAR

Aorta: Extensive abdominal aortic atherosclerotic calcifications.
There is a nonocclusive calcification within the lumen of the
infrarenal abdominal aorta measuring 2 x 1.2 cm, image 56/4. Normal
caliber aorta without aneurysm, dissection, or vasculitis.

Celiac: Patent without evidence of aneurysm, dissection, vasculitis
or significant stenosis.

SMA: Calcified plaque at the origin of the SMA results in
approximately 50% stenosis.

Renals: Right renal artery is patent without significant stenosis.
Calcified plaque at the origin of the left renal artery results in
approximately 50% stenosis.

IMA: Appears chronically occluded with calcified plaque at the
origin.

Inflow: Patent without evidence of aneurysm, dissection, vasculitis
or significant stenosis.

Proximal Outflow: Bilateral common femoral and visualized portions
of the superficial and profunda femoral arteries are patent without
evidence of aneurysm, dissection, vasculitis or significant
stenosis.

Veins: No obvious venous abnormality within the limitations of this
arterial phase study.

Review of the MIP images confirms the above findings.

NON-VASCULAR

Lower chest: No acute abnormality.

Hepatobiliary: No focal liver abnormality. Stone within the
gallbladder measures 2.3 cm. No signs of gallbladder wall
inflammation. No bile duct dilatation.

Pancreas: Unremarkable. No pancreatic ductal dilatation or
surrounding inflammatory changes.

Spleen: Normal in size without focal abnormality.

Adrenals/Urinary Tract: Normal adrenal glands. Bilateral renal
atrophy. No nephrolithiasis, hydronephrosis or mass identified.
Urinary bladder is unremarkable.

Stomach/Bowel: Stomach appears nondistended. There is no bowel wall
thickening, inflammation, or distension. Anastomotic suture line is
identified at the level of the rectum. Distal to the anastomotic
suture line there is active extravasation of contrast material on
the arterial phase images, image 205/5. Further accumulation of
intraluminal contrast material is identified on the delayed portal
venous images, image 198/4.

Lymphatic: No signs of abdominopelvic adenopathy.

Reproductive: Uterus and bilateral adnexa are unremarkable.

Other: There is a fat density mass within the central aspect of the
abdomen which measures 5.1 x 3.9 cm and likely represents an area of
remote fat necrosis, image 96/4. Fluid attenuating structure in the
right side of pelvis is indeterminate measuring 5.2 x 3.0 cm, image
163/4.

Musculoskeletal: The bones appear diffusely osteopenic. Multilevel
thoracolumbar degenerative disc disease.
IMPRESSION: 1. There is active intraluminal extravasation of contrast material
within the rectum just distal to anastomotic suture line. This is
compatible with active GI bleeding within the rectum.
2. Extensive aortic atherosclerosis. There is approximately 50%
stenosis at the origin of the SMA and left renal artery. Calcified
intraluminal filling defect within the infra renal abdominal aorta
may reflect calcification of previously ruptured atherosclerotic
plaque.
3. The inferior mesenteric artery is occluded with calcified plaque
at the origin.
4. Cholelithiasis without evidence of acute cholecystitis.
5. Fluid attenuating structure in the right side of the pelvis is
indeterminate. This is favored to represent a benign abnormality [REDACTED] be related to prior surgeries.
6. Aortic Atherosclerosis ([95]-[95]).

Critical Value/emergent results were called by telephone at the time
of interpretation on [DATE] at [DATE] to provider POZISA ,
who verbally acknowledged these results.

## 2021-06-05 MED ORDER — GENTAMICIN SULFATE 0.1 % EX CREA
1.0000 "application " | TOPICAL_CREAM | Freq: Every day | CUTANEOUS | Status: DC
  Administered 2021-06-05 – 2021-06-08 (×6): 1 via TOPICAL
  Filled 2021-06-05 (×4): qty 15

## 2021-06-05 MED ORDER — SODIUM CHLORIDE 0.9 % IV SOLN: INTRAVENOUS | Status: DC

## 2021-06-05 MED ORDER — POLYETHYLENE GLYCOL 3350 17 GM/SCOOP PO POWD
1.0000 | Freq: Once | ORAL | Status: AC
  Administered 2021-06-05: 255 g via ORAL
  Filled 2021-06-05 (×3): qty 255

## 2021-06-05 MED ORDER — PEG 3350-KCL-NA BICARB-NACL 420 G PO SOLR: 4000.0000 mL | Freq: Once | ORAL | Status: DC

## 2021-06-05 MED ORDER — DELFLEX-LC/1.5% DEXTROSE 344 MOSM/L IP SOLN
INTRAPERITONEAL | Status: DC
  Filled 2021-06-05 (×4): qty 3000

## 2021-06-05 MED ORDER — IOHEXOL 350 MG/ML SOLN
80.0000 mL | Freq: Once | INTRAVENOUS | Status: AC | PRN
  Administered 2021-06-05: 80 mL via INTRAVENOUS
  Filled 2021-06-05: qty 80

## 2021-06-05 NOTE — Progress Notes (Signed)
Admission profile udpated ?

## 2021-06-05 NOTE — Progress Notes (Signed)
?PROGRESS NOTE ? ? ? ?Misty [name]  WYO:[id number] DOB: 08/04/1940 DOA: 06/04/2021 ?PCP: Gayland Curry, MD  ? ? ?Brief Narrative:  ?Misty [name] is a 81 y.o. female with medical history significant of colon cancer, end-stage renal disease peritoneal dialysis, type 2 diabetes, essential hypertension, chronic diastolic congestive heart failure, history of TIA who present to the hospital with a rectal bleeding and generalized weakness. ?Patient normally walks with a walker, on a walk a few steps, she has frequent falls due to weakness.  She started have rectal bleeding since Sunday, it was fresh red blood mixed with clots.  She did not have any abdominal pain.  Her last bowel movement was Saturday which was normal.  At the same time, patient was also nauseous and vomited a few times, her emesis was not bloody or black. ?Patient also has some dizziness and increased weakness. ?  ?Upon arriving the hospital, patient temperature was 98.4, heart rate 90, respirate 16, blood pressure 112/51, oxygen saturation 97% on room air. ?Her hemoglobin was 7.0, platelets 405, creatinine 6.9, glucose 260. ?Emergency room physician has ordered a blood transfusion. ? ?3/29Still bleeding per rectum this am.  ? ? ?Consultants:  ?GI, nephrology , nephrology ? ?Procedures:  ? ?Antimicrobials:  ? ? ?Subjective: ? ? ?Diet. ?Objective: ?Vitals:  ? 06/05/21 0837 06/05/21 0900 06/05/21 1200 06/05/21 1330  ?BP: 124/87 (!) 148/73 (!) 147/45 (!) 124/50  ?Pulse: 81 78 82 73  ?Resp: '16 19 13 16  '$ ?Temp: 98.2 ?F (36.8 ?C)   98.6 ?F (37 ?C)  ?TempSrc: Oral     ?SpO2: 100% 100% 98% 100%  ?Height:      ? ? ?Intake/Output Summary (Last 24 hours) at 06/05/2021 1336 ?Last data filed at 06/05/2021 0141 ?Gross per 24 hour  ?Intake 1256.25 ml  ?Output 2 ml  ?Net 1254.25 ml  ? ?There were no vitals filed for this visit. ? ?Examination: ?Calm, NAD, pale ?Cta no w/r ?Reg s1/s2 no gallop ?Soft benign +bs ?No edema ?Awake and alert ?Mood and affect  appropriate in current setting  ? ? ? ?Data Reviewed: I have personally reviewed following labs and imaging studies ? ?CBC: ?Recent Labs  ?Lab 06/04/21 ?1428 06/05/21 ?0129 06/05/21 ?7741 06/05/21 ?2878  ?WBC 11.1* 13.0*  --   --   ?HGB 7.0* 9.8* 8.5* 8.7*  ?HCT 21.9* 28.9*  --   --   ?MCV 98.6 88.9  --   --   ?PLT 405* 285  --   --   ? ?Basic Metabolic Panel: ?Recent Labs  ?Lab 06/04/21 ?1428 06/05/21 ?0129  ?NA 137 132*  ?K 4.4 4.0  ?CL 97* 96*  ?CO2 26 25  ?GLUCOSE 268* 271*  ?BUN 44* 48*  ?CREATININE 6.90* 6.78*  ?CALCIUM 9.3 8.7*  ? ?GFR: ?CrCl cannot be calculated (Unknown ideal weight.). ?Liver Function Tests: ?Recent Labs  ?Lab 06/04/21 ?1630  ?AST 18  ?ALT 10  ?ALKPHOS 43  ?BILITOT 0.4  ?PROT 6.2*  ?ALBUMIN 2.8*  ? ?No results for input(s): LIPASE, AMYLASE in the last 168 hours. ?No results for input(s): AMMONIA in the last 168 hours. ?Coagulation Profile: ?Recent Labs  ?Lab 06/04/21 ?1630  ?INR 1.1  ? ?Cardiac Enzymes: ?No results for input(s): CKTOTAL, CKMB, CKMBINDEX, TROPONINI in the last 168 hours. ?BNP (last 3 results) ?No results for input(s): PROBNP in the last 8760 hours. ?HbA1C: ?Recent Labs  ?  06/04/21 ?1428  ?HGBA1C 6.6*  ? ?CBG: ?Recent Labs  ?Lab 06/04/21 ?2045 06/05/21 ?6767 06/05/21 ?1129  ?  GLUCAP 251* 285* 207*  ? ?Lipid Profile: ?No results for input(s): CHOL, HDL, LDLCALC, TRIG, CHOLHDL, LDLDIRECT in the last 72 hours. ?Thyroid Function Tests: ?No results for input(s): TSH, T4TOTAL, FREET4, T3FREE, THYROIDAB in the last 72 hours. ?Anemia Panel: ?No results for input(s): VITAMINB12, FOLATE, FERRITIN, TIBC, IRON, RETICCTPCT in the last 72 hours. ?Sepsis Labs: ?No results for input(s): PROCALCITON, LATICACIDVEN in the last 168 hours. ? ?No results found for this or any previous visit (from the past 240 hour(s)).  ? ? ? ? ? ?Radiology Studies: ?CT Head Wo Contrast ? ?Result Date: 06/04/2021 ?CLINICAL DATA:  Head injury. EXAM: CT HEAD WITHOUT CONTRAST CT CERVICAL SPINE WITHOUT CONTRAST  TECHNIQUE: Multidetector CT imaging of the head and cervical spine was performed following the standard protocol without intravenous contrast. Multiplanar CT image reconstructions of the cervical spine were also generated. RADIATION DOSE REDUCTION: This exam was performed according to the departmental dose-optimization program which includes automated exposure control, adjustment of the mA and/or kV according to patient size and/or use of iterative reconstruction technique. COMPARISON:  None. FINDINGS: CT HEAD FINDINGS Brain: No evidence of acute infarction, hemorrhage, hydrocephalus, extra-axial collection or mass lesion/mass effect. Vascular: No hyperdense vessel or unexpected calcification. Skull: Normal. Negative for fracture or focal lesion. Sinuses/Orbits: No acute finding. Other: None. CT CERVICAL SPINE FINDINGS Alignment: Normal. Skull base and vertebrae: No acute fracture. No primary bone lesion or focal pathologic process. Soft tissues and spinal canal: No prevertebral fluid or swelling. No visible canal hematoma. Disc levels: Moderate degenerative disc disease is noted at C3-4, C4-5, C5-6 and C6-7. Upper chest: Negative. Other: Mild degenerative changes are seen involving the posterior facet joints bilaterally. IMPRESSION: No acute intracranial abnormality seen. 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 No acute abnormality is noted. Electronically Signed   By: Marijo Conception M.D.   On: 06/04/2021 16:54  ? ?CT Cervical Spine Wo Contrast ? ?Result Date: 06/04/2021 ?CLINICAL DATA:  Head injury. EXAM: CT HEAD WITHOUT CONTRAST CT CERVICAL SPINE WITHOUT CONTRAST TECHNIQUE: Multidetector CT imaging of the head and cervical spine was performed following the standard protocol without intravenous contrast. Multiplanar CT image reconstructions of the cervical spine were also generated. RADIATION DOSE REDUCTION: This exam was performed according to the departmental  dose-optimization program which includes automated exposure control, adjustment of the mA and/or kV according to patient size and/or use of iterative reconstruction technique. COMPARISON:  None. FINDINGS: CT HEAD FINDINGS Brain: No evidence of acute infarction, hemorrhage, hydrocephalus, extra-axial collection or mass lesion/mass effect. Vascular: No hyperdense vessel or unexpected calcification. Skull: Normal. Negative for fracture or focal lesion. Sinuses/Orbits: No acute finding. Other: None. CT CERVICAL SPINE FINDINGS Alignment: Normal. Skull base and vertebrae: No acute fracture. No primary bone lesion or focal pathologic process. Soft tissues and spinal canal: No prevertebral fluid or swelling. No visible canal hematoma. Disc levels: Moderate degenerative disc disease is noted at C3-4, C4-5, C5-6 and C6-7. Upper chest: Negative. Other: Mild degenerative changes are seen involving the posterior facet joints bilaterally. IMPRESSION: No acute intracranial abnormality seen. 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 No acute abnormality is noted. Electronically Signed   By: Marijo Conception M.D.   On: 06/04/2021 16:54  ? ?CT ANGIO GI BLEED ? ?Result Date: 06/05/2021 ?CLINICAL DATA:  Evaluate for lower GI bleed. History of rectal bleeding since Sunday. EXAM: CTA ABDOMEN AND PELVIS WITHOUT AND WITH CONTRAST TECHNIQUE: Multidetector CT imaging of the abdomen and pelvis  was performed using the standard protocol during bolus administration of intravenous contrast. Multiplanar reconstructed images and MIPs were obtained and reviewed to evaluate the vascular anatomy. RADIATION DOSE REDUCTION: This exam was performed according to the departmental dose-optimization program which includes automated exposure control, adjustment of the mA and/or kV according to patient size and/or use of iterative reconstruction technique. CONTRAST:  85m OMNIPAQUE IOHEXOL 350 MG/ML SOLN COMPARISON:  None.  FINDINGS: VASCULAR Aorta: Extensive abdominal aortic atherosclerotic calcifications. There is a nonocclusive calcification within the lumen of the infrarenal abdominal aorta measuring 2 x 1.2 cm, image 56/4. Normal caliber aorta with

## 2021-06-05 NOTE — Progress Notes (Signed)
[facility] ?[place], [place] ?06/05/21 ? ?Subjective:  ? ?LOS: 1 ?Patient known to our practice from outpatient dialysis.  She is followed for peritoneal dialysis. ?She was seen in the emergency room.  Husband at bedside.  She presented for rectal bleeding and weakness.  She states rectal bleeding started Sunday and has been ongoing off and on. ? ?Patient had CT angiogram which showed extravasation of contrast within the rectum distal to the anastomotic suture line compatible with active GI bleed within the rectum.  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  Vascular surgery has been consulted and is assisting with management. ?Nephrology consult requested for evaluation and management of ESRD and continuation of peritoneal dialysis. ?  ? ?Objective:  ?Vital signs in last 24 hours:  ?Temp:  [98.1 ?F (36.7 ?C)-98.6 ?F (37 ?C)] 98.6 ?F (37 ?C) (03/29 1330) ?Pulse Rate:  [69-85] 73 (03/29 1330) ?Resp:  [13-19] 16 (03/29 1330) ?BP: (117-150)/(41-87) 124/50 (03/29 1330) ?SpO2:  [98 %-100 %] 100 % (03/29 1330) ? ?Weight change:  ?There were no vitals filed for this visit. ? ?Intake/Output: ?  ? ?Intake/Output Summary (Last 24 hours) at 06/05/2021 1556 ?Last data filed at 06/05/2021 0141 ?Gross per 24 hour  ?Intake 1256.25 ml  ?Output 2 ml  ?Net 1254.25 ml  ? ? ?Physical Exam: ?General:  No acute distress, laying in the bed  ?HEENT  anicteric, moist oral mucous membrane  ?Pulm/lungs  normal breathing effort, lungs are clear to auscultation  ?CVS/Heart  regular rhythm, no rub or gallop  ?Abdomen:   Soft, PD catheter in place  ?Extremities:  No peripheral edema  ?Neurologic:  Alert, oriented, able to follow commands  ?Skin:  No acute rashes  ? ? ? ?Basic Metabolic Panel: ? ?Recent Labs  ?Lab 06/04/21 ?1428 06/05/21 ?0129  ?NA 137 132*  ?K 4.4 4.0  ?CL 97* 96*  ?CO2 26 25  ?GLUCOSE 268* 271*  ?BUN 44* 48*  ?CREATININE 6.90* 6.78*  ?CALCIUM 9.3  8.7*  ? ? ? ?CBC: ?Recent Labs  ?Lab 06/04/21 ?1428 06/05/21 ?0129 06/05/21 ?8416 06/05/21 ?6063 06/05/21 ?1406  ?WBC 11.1* 13.0*  --   --   --   ?HGB 7.0* 9.8* 8.5* 8.7* 7.5*  ?HCT 21.9* 28.9*  --   --  22.4*  ?MCV 98.6 88.9  --   --   --   ?PLT 405* 285  --   --   --   ? ? ? No results found for: HEPBSAG, HEPBSAB, HEPBIGM ? ? ? ?Microbiology: ? ?No results found for this or any previous visit (from the past 240 hour(s)). ? ?Coagulation Studies: ?Recent Labs  ?  06/04/21 ?1630  ?LABPROT 13.7  ?INR 1.1  ? ? ?Urinalysis: ?No results for input(s): COLORURINE, LABSPEC, [place], GLUCOSEU, HGBUR, BILIRUBINUR, KETONESUR, PROTEINUR, UROBILINOGEN, NITRITE, LEUKOCYTESUR in the last 72 hours. ? ?Invalid input(s): APPERANCEUR  ? ? ?Imaging: ?CT Head Wo Contrast ? ?Result Date: 06/04/2021 ?CLINICAL DATA:  Head injury. EXAM: CT HEAD WITHOUT CONTRAST CT CERVICAL SPINE WITHOUT CONTRAST TECHNIQUE: Multidetector CT imaging of the head and cervical spine was performed following the standard protocol without intravenous contrast. Multiplanar CT image reconstructions of the cervical spine were also generated. RADIATION DOSE REDUCTION: This exam was performed according to the departmental dose-optimization program which includes automated exposure control, adjustment of the mA and/or kV according to patient size and/or use of iterative reconstruction technique. COMPARISON:  None. FINDINGS: CT HEAD FINDINGS Brain: No  evidence of acute infarction, hemorrhage, hydrocephalus, extra-axial collection or mass lesion/mass effect. Vascular: No hyperdense vessel or unexpected calcification. Skull: Normal. Negative for fracture or focal lesion. Sinuses/Orbits: No acute finding. Other: None. CT CERVICAL SPINE FINDINGS Alignment: Normal. Skull base and vertebrae: No acute fracture. No primary bone lesion or focal pathologic process. Soft tissues and spinal canal: No prevertebral fluid or swelling. No visible canal hematoma. Disc levels: Moderate  degenerative disc disease is noted at C3-4, C4-5, C5-6 and C6-7. Upper chest: Negative. Other: Mild degenerative changes are seen involving the posterior facet joints bilaterally. IMPRESSION: No acute intracranial abnormality seen. 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 No acute abnormality is noted. Electronically Signed   By: Marijo Conception M.D.   On: 06/04/2021 16:54  ? ?CT Cervical Spine Wo Contrast ? ?Result Date: 06/04/2021 ?CLINICAL DATA:  Head injury. EXAM: CT HEAD WITHOUT CONTRAST CT CERVICAL SPINE WITHOUT CONTRAST TECHNIQUE: Multidetector CT imaging of the head and cervical spine was performed following the standard protocol without intravenous contrast. Multiplanar CT image reconstructions of the cervical spine were also generated. RADIATION DOSE REDUCTION: This exam was performed according to the departmental dose-optimization program which includes automated exposure control, adjustment of the mA and/or kV according to patient size and/or use of iterative reconstruction technique. COMPARISON:  None. FINDINGS: CT HEAD FINDINGS Brain: No evidence of acute infarction, hemorrhage, hydrocephalus, extra-axial collection or mass lesion/mass effect. Vascular: No hyperdense vessel or unexpected calcification. Skull: Normal. Negative for fracture or focal lesion. Sinuses/Orbits: No acute finding. Other: None. CT CERVICAL SPINE FINDINGS Alignment: Normal. Skull base and vertebrae: No acute fracture. No primary bone lesion or focal pathologic process. Soft tissues and spinal canal: No prevertebral fluid or swelling. No visible canal hematoma. Disc levels: Moderate degenerative disc disease is noted at C3-4, C4-5, C5-6 and C6-7. Upper chest: Negative. Other: Mild degenerative changes are seen involving the posterior facet joints bilaterally. IMPRESSION: No acute intracranial abnormality seen. 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 No acute  abnormality is noted. Electronically Signed   By: Marijo Conception M.D.   On: 06/04/2021 16:54  ? ?CT ANGIO GI BLEED ? ?Result Date: 06/05/2021 ?CLINICAL DATA:  Evaluate for lower GI bleed. History of rectal bleeding since Sunday. EXAM: CTA ABDOMEN AND PELVIS WITHOUT AND WITH CONTRAST TECHNIQUE: Multidetector CT imaging of the abdomen and pelvis was performed using the standard protocol during bolus administration of intravenous contrast. Multiplanar reconstructed images and MIPs were obtained and reviewed to evaluate the vascular anatomy. RADIATION DOSE REDUCTION: This exam was performed according to the departmental dose-optimization program which includes automated exposure control, adjustment of the mA and/or kV according to patient size and/or use of iterative reconstruction technique. CONTRAST:  71m OMNIPAQUE IOHEXOL 350 MG/ML SOLN COMPARISON:  None. FINDINGS: VASCULAR Aorta: Extensive abdominal aortic atherosclerotic calcifications. There is a nonocclusive calcification within the lumen of the infrarenal abdominal aorta measuring 2 x 1.2 cm, image 56/4. Normal caliber aorta without aneurysm, dissection, or vasculitis. Celiac: Patent without evidence of aneurysm, dissection, vasculitis or significant stenosis. SMA: Calcified plaque at the origin of the SMA results in approximately 50% stenosis. Renals: Right renal artery is patent without significant stenosis. Calcified plaque at the origin of the left renal artery results in approximately 50% stenosis. 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 Inflow: Patent without evidence of aneurysm, dissection, vasculitis or significant stenosis. Proximal Outflow: Bilateral common femoral and visualized portions of the superficial and  profunda femoral arteries are patent without evidence of aneurysm, dissection, vasculitis or significant stenosis. Veins: No obvious venous abnormality within the limitations of this arterial phase study. Review  of the MIP images confirms the above findings. NON-VASCULAR Lower chest: No acute abnormality. Hepatobiliary: No focal liver abnormality. Stone within the gallbladder measures 2.3 cm. No signs of gallblad

## 2021-06-05 NOTE — Consult Note (Signed)
?[facility] ?Vascular Consult Note ? ?MRN : [id number] ? ?Misty [name] is a 81 y.o. (1940-11-12) female who presents with chief complaint of  ?Chief Complaint  ?Patient presents with  ? Weakness  ?. ? ?History of Present Illness: We are asked to consult by Dr. Kurtis Bushman for acute lower GI bleed.  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  Prior to the hospitalization the patient had weakness but has been significantly worse.  The patient's bleeding has been ongoing since Sunday.  She denies any current abdominal pain.  She has also had some vomiting associated.  She has still continued to have bleeding per rectum. ? ?Current Facility-Administered Medications  ?Medication Dose Route Frequency Provider Last Rate Last Admin  ? 0.9 %  sodium chloride infusion  10 mL/hr Intravenous Once Nena Polio, MD      ? 0.9 %  sodium chloride infusion   Intravenous Continuous Murlean Iba, MD 100 mL/hr at 06/05/21 1022 New Bag at 06/05/21 1022  ? 0.9 %  sodium chloride infusion   Intravenous Continuous [name], MD      ? acetaminophen (TYLENOL) tablet 650 mg  650 mg Oral Q4H PRN Sharen Hones, MD      ? carvedilol (COREG) tablet 12.5 mg  12.5 mg Oral BID WC Sharen Hones, MD   12.5 mg at 06/05/21 6226  ? dialysis solution 1.5% low-MG/low-CA dianeal solution   Intraperitoneal Q24H Colon Flattery, NP      ? famotidine (PEPCID) tablet 20 mg  20 mg Oral Revonda Standard, MD   20 mg at 06/05/21 3335  ? gabapentin (NEURONTIN) capsule 300 mg  300 mg Oral QHS Sharen Hones, MD   300 mg at 06/04/21 2155  ? gentamicin cream (GARAMYCIN) 0.1 % 1 application.  1 application. Topical Daily Breeze, Benancio Deeds, NP      ? insulin aspart (novoLOG) injection 0-9 Units  0-9  Units Subcutaneous TID WC Sharen Hones, MD   3 Units at 06/05/21 1203  ? insulin glargine-yfgn (SEMGLEE) injection 10 Units  10 Units Subcutaneous QHS Sharen Hones, MD   10 Units at 06/04/21 2155  ? methocarbamol (ROBAXIN) tablet 500 mg  500 mg Oral Q8H PRN Sharen Hones, MD      ? polyethylene glycol powder (GLYCOLAX/MIRALAX) container 255 g  1 Container Oral Once [name], MD      ? sevelamer carbonate (RENVELA) tablet 1,600 mg  1,600 mg Oral BID WC Sharen Hones, MD   1,600 mg at 06/05/21 0840  ? ? ?Past Medical History:  ?Diagnosis Date  ? Anemia of chronic renal failure   ? Aortic valve stenosis 07/02/2017  ? a.) TTE 07/02/2017: 55-60%; mild AS with MPG 13.0 mmHg. b.) TTE 10/14/2019: EF >55%; MPG 23.0 mmHg. c.) TTE 06/23/2020: EF 50-55%; MPG 19.7 mmHg. d.) TTE 11/27/2020: EF >55%; MPG 18.0 mmHg.  ? Chronic lower back pain   ? Complication of anesthesia   ? a.) severe myalgias with succinylcholine use. b.) (+) difficult airway.  ? Diastolic dysfunction 45/62/5638  ? a.) TTE 07/02/2017; EF 55-60%; mild AR; G1DD. b.) TTE 06/23/2020: EF 50-55% with mild LVH; mild to mod LA enlargement; mild AR, mild to mod MR; G2DD.  ? Difficult airway for intubation   ? a.) 08/08/2020 at [facility] --> Initial attempt  with Sabra Heck 3, larynx was "rigid/fixed"; unable to visualize anything but epiglottis. Glidescope utilized to obtain a great view with VL. First attempt with Glidescope unsuccessful d/t anterior orientation. ETT withdrawn and a greater bend was placed. Second attempt was successful with effort. Patient sustained lip laceration during intubation attempts.  ? 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 a.) TTE 11/03/2019: measured 3.7 cm  ? ESRD on peritoneal dialysis [facility])   ? GERD (gastroesophageal reflux disease)   ? Glaucoma   ? Heart murmur   ? HLD (hyperlipidemia)   ? Hyperparathyroidism ([place])   ? a.) s/p total parathyroidectomy  ? Hypertension   ? Hypertensive retinopathy   ? Lumbar radiculopathy   ? MGUS  (monoclonal gammopathy of unknown significance)   ? Motion sickness   ? Nonproliferative diabetic retinopathy ([place])   ? Osteoarthritis   ? Peritoneal dialysis catheter in place [facility])   ? Personal history of chemotherapy   ? Personal history of radiation therapy   ? Rectal cancer ([place]) 2011  ? a.) Clinical stage T3N0. b.) s/p 3 cycles of neoadjuvant CI 5FU + XRT c.) s/p resection on 08/08/2009.  ? Spinal stenosis of lumbar region with neurogenic claudication   ? T2DM (type 2 diabetes mellitus) ([place])   ? TIA (transient ischemic attack)   ? ? ?Past Surgical History:  ?Procedure Laterality Date  ? BREAST BIOPSY Left   ? bx/ clip-neg  ? COLONOSCOPY N/A 05/03/2020  ? Procedure: COLONOSCOPY;  Surgeon: Lesly Rubenstein, MD;  Location: [facility] ENDOSCOPY;  Service: Endoscopy;  Laterality: N/A;  ? coloretcal cancer    ? DIALYSIS/PERMA CATHETER REMOVAL N/A 10/01/2020  ? Procedure: DIALYSIS/PERMA CATHETER REMOVAL;  Surgeon: Algernon Huxley, MD;  Location: [place] [facility] LAB;  Service: Cardiovascular;  Laterality: N/A;  ? LUMBAR LAMINECTOMY/DECOMPRESSION MICRODISCECTOMY Right 01/23/2021  ? Procedure: RIGHT L5-S1 DECOMPRESSION;  Surgeon: Meade Maw, MD;  Location: [facility] ORS;  Service: Neurosurgery;  Laterality: Right;  ? thyroid gland    ? parathyroid  ? TUBAL LIGATION    ? ? ?Social History ?Social History  ? ?Tobacco Use  ? Smoking status: Never  ? Smokeless tobacco: Never  ?Vaping Use  ? Vaping Use: Never used  ?Substance Use Topics  ? Alcohol use: Never  ? Drug use: Never  ? ? ?Family History ?Family History  ?Problem Relation Age of Onset  ? Lung disease Sister   ? Diabetes Mellitus II Paternal Grandmother   ? Breast cancer Neg Hx   ? ? ?Allergies  ?Allergen Reactions  ? Succinylcholine Other (See Comments)  ?  Severe myalgias  ? Penicillins Rash  ?  Tolerated cefazolin prior  ? ? ? ?REVIEW OF SYSTEMS (Negative unless checked) ? ?Constitutional: '[]'$ Weight loss  '[]'$ Fever  '[]'$ Chills ?Cardiac: '[]'$ Chest pain   '[]'$ Chest pressure    '[]'$ Palpitations   '[]'$ Shortness of breath when laying flat   '[]'$ Shortness of breath at rest   '[]'$ Shortness of breath with exertion. ?Vascular:  '[]'$ Pain in legs with walking   '[]'$ Pain in legs at rest   '[]'$ Pain in legs when laying flat   '[]'$ Claudication   '[]'$ Pain in feet when walking  '[]'$ Pain in feet at rest  '[]'$ Pain in feet when laying flat   '[]'$ History of DVT   '[]'$ Phlebitis   '[]'$ Swelling in legs   '[]'$ Varicose veins   '[]'$ Non-healing ulcers ?Pulmonary:   '[]'$ Uses home oxygen   '[]'$ Productive cough   '[]'$ Hemoptysis   '[]'$ Wheeze  '[]'$ COPD   '[]'$ Asthma ?Neurologic:  '[]'$ Dizziness  '[]'$ Blackouts   '[]'$   Seizures   '[]'$ History of stroke   '[x]'$ History of TIA  '[]'$ Aphasia   '[]'$ Temporary blindness   '[]'$ Dysphagia   '[]'$ Weakness or numbness in arms   '[x]'$ Weakness or numbness in legs ?Musculoskeletal:  '[]'$ Arthritis   '[]'$ Joint swelling   '[]'$ Joint pain   '[]'$ Low back pain ?Hematologic:  '[]'$ Easy bruising  '[]'$ Easy bleeding   '[]'$ Hypercoagulable state   '[]'$ Anemic  '[]'$ Hepatitis ?Gastrointestinal:  '[x]'$ Blood in stool   '[]'$ Vomiting blood  '[]'$ Gastroesophageal reflux/heartburn   '[]'$ Difficulty swallowing. ?Genitourinary:  '[]'$ Chronic kidney disease   '[]'$ Difficult urination  '[]'$ Frequent urination  '[]'$ Burning with urination   '[]'$ Blood in urine ?Skin:  '[]'$ Rashes   '[]'$ Ulcers   '[]'$ Wounds ?Psychological:  '[]'$ History of anxiety   '[]'$  History of major depression. ? ?Physical Examination ? ?Vitals:  ? 06/05/21 0837 06/05/21 0900 06/05/21 1200 06/05/21 1330  ?BP: 124/87 (!) 148/73 (!) 147/45 (!) 124/50  ?Pulse: 81 78 82 73  ?Resp: '16 19 13 16  '$ ?Temp: 98.2 ?F (36.8 ?C)   98.6 ?F (37 ?C)  ?TempSrc: Oral     ?SpO2: 100% 100% 98% 100%  ?Height:      ? ?Body mass index is 21.56 kg/m?. ?Gen:  WD/WN, NAD ?Head: [place]/AT, No temporalis wasting. Prominent temp pulse not noted. ?Ear/Nose/Throat: Hearing grossly intact, nares w/o erythema or drainage, oropharynx w/o Erythema/Exudate ?Eyes: Sclera non-icteric, conjunctiva clear ?Neck: Trachea midline.  No JVD.  ?Pulmonary:  Good air movement, respirations not labored, equal  bilaterally.  ?Cardiac: RRR, normal S1, S2. ?Vascular: No lower extremity edema noted ?Vessel Right Left  ?PT Trace Palpable Trace Palpable  ?DP Trace Palpable Trace Palpable  ? ?Gastrointestinal: soft, non-tender/non-d

## 2021-06-05 NOTE — Progress Notes (Signed)
Patient connected to periotoneal dialysis cycler APOLID#[id number] without complications. ?

## 2021-06-05 NOTE — H&P (Signed)
?  ?Misty [name] , MD ?[address], [place], [place], [place], [address] ?[address] [place], [place], [place], [address] ?Phone: [phone]  ?Fax: [phone] ? Consultation ? ?Referring Provider:     ER ?Primary Care Physician:  Gayland Curry, MD ?Primary Gastroenterologist:  [facility]  ?Reason for Consultation:     GI bleed ? ?Date of Admission:  06/04/2021 ?Date of Consultation:  06/05/2021 ?       ? HPI:   ?Misty [name] is a 81 y.o. female Who is a patient of [facility] was last seen back in 2020 for chronic diarrhea.  History of rectal cancer in 2011 had ileostomy with reversal after chemotherapy and radiation.  Last colonoscopy was back in 2019 in [place] no abnormalities noted. ? ?She presented emergency room with weakness.  History of rectal bleeding since Sunday.multiple bloody bowel movements with clots.  Ongoing at this point of time.  Just had a large bloody bowel movement a short while back.  Feels exhausted and tired.  Feels lightheaded on sitting up.  Denies any hematemesis.  Denies any NSAID use.  Denies any use of blood thinners.  Nonbloody vomiting.  Other comorbidities include end-stage renal disease on dialysis, diabetes, hypertension, CHF.  In the ER she was noted to have a hemoglobin of 7 g which was 10.9 g 4 months back.  MCV of 98.6.  And this morning hemoglobin is 8.7 g..  She has a history of multiple myeloma.   ?Past Medical History:  ?Diagnosis Date  ? Anemia of chronic renal failure   ? Aortic valve stenosis 07/02/2017  ? a.) TTE 07/02/2017: 55-60%; mild AS with MPG 13.0 mmHg. b.) TTE 10/14/2019: EF >55%; MPG 23.0 mmHg. c.) TTE 06/23/2020: EF 50-55%; MPG 19.7 mmHg. d.) TTE 11/27/2020: EF >55%; MPG 18.0 mmHg.  ? Chronic lower back pain   ? Complication of anesthesia   ? a.) severe myalgias with succinylcholine use. b.) (+) difficult airway.  ? Diastolic dysfunction 19/62/2297  ? a.) TTE 07/02/2017; EF 55-60%; mild AR; G1DD. b.) TTE 06/23/2020: EF 50-55%  with mild LVH; mild to mod LA enlargement; mild AR, mild to mod MR; G2DD.  ? Difficult airway for intubation   ? a.) 08/08/2020 at [facility] --> Initial attempt with Sabra Heck 3, larynx was "rigid/fixed"; unable to visualize anything but epiglottis. Glidescope utilized to obtain a great view with VL. First attempt with Glidescope unsuccessful d/t anterior orientation. ETT withdrawn and a greater bend was placed. Second attempt was successful with effort. Patient sustained lip laceration during intubation attempts.  ? 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 a.) TTE 11/03/2019: measured 3.7 cm  ? ESRD on peritoneal dialysis [facility])   ? GERD (gastroesophageal reflux disease)   ? Glaucoma   ? Heart murmur   ? HLD (hyperlipidemia)   ? Hyperparathyroidism ([place])   ? a.) s/p total parathyroidectomy  ? Hypertension   ? Hypertensive retinopathy   ? Lumbar radiculopathy   ? MGUS (monoclonal gammopathy of unknown significance)   ? Motion sickness   ? Nonproliferative diabetic retinopathy ([place])   ? Osteoarthritis   ? Peritoneal dialysis catheter in place [facility])   ? Personal history of chemotherapy   ? Personal history of radiation therapy   ? Rectal cancer ([place]) 2011  ? a.) Clinical stage T3N0. b.) s/p 3 cycles of neoadjuvant CI 5FU + XRT c.) s/p resection on 08/08/2009.  ? Spinal stenosis of lumbar region with neurogenic claudication   ? T2DM (type  2 diabetes mellitus) ([place])   ? TIA (transient ischemic attack)   ? ? ?Past Surgical History:  ?Procedure Laterality Date  ? BREAST BIOPSY Left   ? bx/ clip-neg  ? COLONOSCOPY N/A 05/03/2020  ? Procedure: COLONOSCOPY;  Surgeon: Lesly Rubenstein, MD;  Location[facility] ENDOSCOPY;  Service: Endoscopy;  Laterality: N/A;  ? coloretcal cancer    ? DIALYSIS/PERMA CATHETER REMOVAL N/A 10/01/2020  ? Procedure: DIALYSIS/PERMA CATHETER REMOVAL;  Surgeon: Algernon Huxley, MD;  Location: [place] CV LAB;  Service: Cardiovascular;  Laterality: N/A;  ? LUMBAR LAMINECTOMY/DECOMPRESSION  MICRODISCECTOMY Right 01/23/2021  ? Procedure: RIGHT L5-S1 DECOMPRESSION;  Surgeon: Meade Maw, MD;  Location: [facility] ORS;  Service: Neurosurgery;  Laterality: Right;  ? thyroid gland    ? parathyroid  ? TUBAL LIGATION    ? ? ?Prior to Admission medications   ?Medication Sig Start Date End Date Taking? Authorizing Provider  ?carvedilol (COREG) 12.5 MG tablet Take 12.5 mg by mouth 2 (two) times daily with a meal.   Yes [provider]  ?cetirizine (ZYRTEC) 10 MG tablet Take 10 mg by mouth every morning.   Yes [provider]  ?Cholecalciferol (VITAMIN D) 50 MCG (2000 UT) tablet Take 2,000 Units by mouth every Monday, Wednesday, and Friday.   Yes [provider]  ?colestipol (COLESTID) 1 g tablet Take 2 g by mouth daily. 10/04/18 06/04/21 Yes [provider]  ?famotidine (PEPCID) 20 MG tablet Take 20 mg by mouth every morning.   Yes [provider]  ?furosemide (LASIX) 80 MG tablet Take 80 mg by mouth daily. 02/28/21  Yes [provider]  ?gabapentin (NEURONTIN) 300 MG capsule Take 1 capsule (300 mg total) by mouth at bedtime. ?Patient taking differently: Take 300 mg by mouth 2 (two) times daily. 06/23/20  Yes Lorella Nimrod, MD  ?HUMALOG 100 UNIT/ML injection Inject 4-12 Units into the skin 2 (two) times daily with a meal. 05/20/18  Yes [provider]  ?hydrALAZINE (APRESOLINE) 50 MG tablet Take 1 tablet (50 mg total) by mouth every 8 (eight) hours. ?Patient taking differently: Take 50 mg by mouth in the morning and at bedtime. 06/23/20  Yes Lorella Nimrod, MD  ?hydrocortisone (ANUSOL-HC) 2.5 % rectal cream Apply 1 application. topically 3 (three) times daily. 06/03/21  Yes [provider]  ?multivitamin (RENA-VIT) TABS tablet Take 1 tablet by mouth every morning. 05/22/20  Yes [provider]  ?NIFEdipine (PROCARDIA XL/NIFEDICAL-XL) 90 MG 24 hr tablet Take 90 mg by mouth every morning. 05/22/20  Yes [provider]  ?sevelamer  carbonate (RENVELA) 800 MG tablet Take 1,600 mg by mouth 2 (two) times daily with a meal.   Yes [provider]  ?acetaminophen (TYLENOL) 325 MG tablet Take 650 mg by mouth every 4 (four) hours as needed for moderate pain.    [provider]  ?Fiber Diet TABS Take 2 tablets by mouth 2 (two) times daily.    [provider]  ?glucose blood test strip 1 each by Finger Stick route 4 times daily (after meals and nightly). Use as directed 12/24/17   [provider]  ?INSULIN SYRINGE 1CC/29G 29G X 1/2" 1 ML MISC     [provider]  ?lidocaine (LIDODERM) 5 % Place 1 patch onto the skin daily as needed (pain).    [provider]  ?methocarbamol (ROBAXIN) 500 MG tablet Take 1 tablet (500 mg total) by mouth every 8 (eight) hours as needed for muscle spasms. 01/23/21   Cooper Render  Truman Hayward, PA  ?senna (SENOKOT) 8.6 MG TABS tablet Take 1 tablet (8.6 mg total) by mouth daily as needed for mild constipation. 01/23/21   Loleta Dicker, PA  ?SURE COMFORT PEN NEEDLES 31G X 8 MM MISC  08/28/18   [provider]  ? ? ?Family History  ?Problem Relation Age of Onset  ? Lung disease Sister   ? Diabetes Mellitus II Paternal Grandmother   ? Breast cancer Neg Hx   ?  ? ?Social History  ? ?Tobacco Use  ? Smoking status: Never  ? Smokeless tobacco: Never  ?Vaping Use  ? Vaping Use: Never used  ?Substance Use Topics  ? Alcohol use: Never  ? Drug use: Never  ? ? ?Allergies as of 06/04/2021 - Review Complete 06/04/2021  ?Allergen Reaction Noted  ? Succinylcholine Other (See Comments) 04/12/2020  ? Penicillins Rash 10/31/2008  ? ? ?Review of Systems:    ?All systems reviewed and negative except where noted in HPI. ? ? Physical Exam:  ?Vital signs in last 24 hours: ?Temp:  [98.1 ?F (36.7 ?C)-98.4 ?F (36.9 ?C)] 98.2 ?F (36.8 ?C) (03/29 6191) ?Pulse Rate:  [69-90] 81 (03/29 0837) ?Resp:  [13-18] 16 (03/29 0837) ?BP: (112-150)/(41-87) 124/87 (03/29 5502) ?SpO2:  [97 %-100 %] 100 % (03/29  0837) ?  ?General: Appears pale ?Head:  Normocephalic and atraumatic. ?Eyes:   No icterus.   Conjunctiva pink. PERRLA. ?Ears:  Normal auditory acuity. ?Neck:  Supple; no masses or thyroidomegaly ?Lungs: Respirations

## 2021-06-05 NOTE — ED Notes (Signed)
Informed RN bed assigned 

## 2021-06-06 ENCOUNTER — Encounter
Admission: EM | Disposition: A | Discharge status: Home Health | Payer: Self-pay | Source: Home / Self Care | Attending: Internal Medicine

## 2021-06-06 DIAGNOSIS — I700 Atherosclerosis of aorta: Secondary | ICD-10-CM

## 2021-06-06 DIAGNOSIS — K922 Gastrointestinal hemorrhage, unspecified: Secondary | ICD-10-CM | POA: Diagnosis not present

## 2021-06-06 DIAGNOSIS — L8990 Pressure ulcer of unspecified site, unspecified stage: Secondary | ICD-10-CM | POA: Insufficient documentation

## 2021-06-06 DIAGNOSIS — I771 Stricture of artery: Secondary | ICD-10-CM

## 2021-06-06 DIAGNOSIS — D62 Acute posthemorrhagic anemia: Secondary | ICD-10-CM | POA: Diagnosis not present

## 2021-06-06 HISTORY — PX: EMBOLIZATION: CATH118239

## 2021-06-06 LAB — GLUCOSE, CAPILLARY
Glucose-Capillary: 207 mg/dL — ABNORMAL HIGH (ref 70–99)
Glucose-Capillary: 165 mg/dL — ABNORMAL HIGH (ref 70–99)
Glucose-Capillary: 147 mg/dL — ABNORMAL HIGH (ref 70–99)
Glucose-Capillary: 126 mg/dL — ABNORMAL HIGH (ref 70–99)
Glucose-Capillary: 123 mg/dL — ABNORMAL HIGH (ref 70–99)

## 2021-06-06 LAB — URINALYSIS, ROUTINE W REFLEX MICROSCOPIC
Bilirubin Urine: NEGATIVE
Glucose, UA: >=500 mg/dL — AB
Ketones, ur: NEGATIVE mg/dL
Nitrite: NEGATIVE
Protein, ur: 100 mg/dL — AB
Specific Gravity, Urine: 1.009 (ref 1.005–1.030)
WBC, UA: >50 WBC/hpf — ABNORMAL HIGH (ref 0–5)
pH: 6 (ref 5.0–8.0)

## 2021-06-06 LAB — HEMOGLOBIN AND HEMATOCRIT, BLOOD
HCT: 25.6 % — ABNORMAL LOW (ref 36.0–46.0)
Hemoglobin: 8.6 g/dL — ABNORMAL LOW (ref 12.0–15.0)

## 2021-06-06 LAB — PREPARE RBC (CROSSMATCH)

## 2021-06-06 LAB — HEMOGLOBIN: Hemoglobin: 7.2 g/dL — ABNORMAL LOW (ref 12.0–15.0)

## 2021-06-06 SURGERY — COLONOSCOPY WITH PROPOFOL
Anesthesia: General

## 2021-06-06 SURGERY — EMBOLIZATION
Anesthesia: Moderate Sedation

## 2021-06-06 MED ORDER — IODIXANOL 320 MG/ML IV SOLN
INTRAVENOUS | Status: DC | PRN
  Administered 2021-06-06: 35 mL

## 2021-06-06 MED ORDER — CLINDAMYCIN PHOSPHATE 300 MG/50ML IV SOLN: 300.0000 mg | Freq: Once | INTRAVENOUS | Status: AC

## 2021-06-06 MED ORDER — HEPARIN SODIUM (PORCINE) 1000 UNIT/ML IJ SOLN
INTRAMUSCULAR | Status: AC
  Filled 2021-06-06: qty 10

## 2021-06-06 MED ORDER — LORAZEPAM 2 MG/ML IJ SOLN
2.0000 mg | Freq: Once | INTRAMUSCULAR | Status: DC
Start: 2021-06-07 — End: 2021-06-06

## 2021-06-06 MED ORDER — METHYLPREDNISOLONE SODIUM SUCC 125 MG IJ SOLR: 125.0000 mg | Freq: Once | INTRAMUSCULAR | Status: DC | PRN

## 2021-06-06 MED ORDER — ACETAMINOPHEN 325 MG PO TABS
650.0000 mg | ORAL_TABLET | Freq: Once | ORAL | Status: AC
  Administered 2021-06-06: 650 mg via ORAL
  Filled 2021-06-06: qty 2

## 2021-06-06 MED ORDER — SODIUM CHLORIDE 0.9% IV SOLUTION: Freq: Once | INTRAVENOUS | Status: AC

## 2021-06-06 MED ORDER — MIDAZOLAM HCL 2 MG/2ML IJ SOLN
INTRAMUSCULAR | Status: AC
  Filled 2021-06-06: qty 2

## 2021-06-06 MED ORDER — FENTANYL CITRATE (PF) 100 MCG/2ML IJ SOLN
INTRAMUSCULAR | Status: AC
  Filled 2021-06-06: qty 2

## 2021-06-06 MED ORDER — MIDAZOLAM HCL 2 MG/ML PO SYRP: 8.0000 mg | ORAL_SOLUTION | Freq: Once | ORAL | Status: DC | PRN

## 2021-06-06 MED ORDER — DIPHENHYDRAMINE HCL 50 MG/ML IJ SOLN: 50.0000 mg | Freq: Once | INTRAMUSCULAR | Status: DC | PRN

## 2021-06-06 MED ORDER — SODIUM CHLORIDE 0.9 % IV SOLN: INTRAVENOUS | Status: DC

## 2021-06-06 MED ORDER — FAMOTIDINE 20 MG PO TABS: 40.0000 mg | ORAL_TABLET | Freq: Once | ORAL | Status: DC | PRN

## 2021-06-06 MED ORDER — HYDROMORPHONE HCL 1 MG/ML IJ SOLN: 1.0000 mg | Freq: Once | INTRAMUSCULAR | Status: DC | PRN

## 2021-06-06 MED ORDER — HEPARIN SODIUM (PORCINE) 1000 UNIT/ML IJ SOLN
INTRAMUSCULAR | Status: DC | PRN
  Administered 2021-06-06: 3000 [IU] via INTRAVENOUS

## 2021-06-06 MED ORDER — MIDAZOLAM HCL 2 MG/2ML IJ SOLN
INTRAMUSCULAR | Status: DC | PRN
  Administered 2021-06-06: .25 mg via INTRAVENOUS
  Administered 2021-06-06: .5 mg via INTRAVENOUS
  Administered 2021-06-06: 1 mg via INTRAVENOUS

## 2021-06-06 MED ORDER — ONDANSETRON HCL 4 MG/2ML IJ SOLN: 4.0000 mg | Freq: Four times a day (QID) | INTRAMUSCULAR | Status: DC | PRN

## 2021-06-06 MED ORDER — FENTANYL CITRATE (PF) 100 MCG/2ML IJ SOLN
INTRAMUSCULAR | Status: DC | PRN
  Administered 2021-06-06: 50 ug via INTRAVENOUS
  Administered 2021-06-06: 25 ug via INTRAVENOUS

## 2021-06-06 MED ORDER — CLINDAMYCIN PHOSPHATE 300 MG/50ML IV SOLN
INTRAVENOUS | Status: AC
  Administered 2021-06-06: 300 mg via INTRAVENOUS
  Filled 2021-06-06: qty 50

## 2021-06-06 SURGICAL SUPPLY — 27 items
BALLN ULTRV 018 6X40X75 (BALLOONS) ×2
BALLN ULTRVRSE 4X40X75C (BALLOONS) ×2
BALLOON ULTRV 018 6X40X75 (BALLOONS) IMPLANT
BALLOON ULTRVRSE 4X40X75C (BALLOONS) IMPLANT
BLOCK BEAD 500-700 (Vascular Products) ×1 IMPLANT
CATH ANGIO 5F PIGTAIL 65CM (CATHETERS) ×1 IMPLANT
CATH BEACON 5 .035 65 KMP TIP (CATHETERS) ×1 IMPLANT
CATH MICROCATH PRGRT 2.8F 130 (MICROCATHETER) IMPLANT
CATH TEMPO 5F RIM 65CM (CATHETERS) ×1 IMPLANT
COIL 400 COMPLEX SOFT 2X2CM (Vascular Products) ×1 IMPLANT
COVER PROBE U/S 5X48 (MISCELLANEOUS) ×1 IMPLANT
DEVICE OCCLUSION PODJ5 (Embolic) IMPLANT
DEVICE STARCLOSE SE CLOSURE (Vascular Products) ×1 IMPLANT
DEVICE TORQUE (MISCELLANEOUS) ×1 IMPLANT
GLIDECATH 4FR STR (CATHETERS) ×1 IMPLANT
GLIDEWIRE ADV .035X180CM (WIRE) ×1 IMPLANT
GUIDEWIRE ADV .018X180CM (WIRE) ×1 IMPLANT
GUIDEWIRE ANGLED .035 180CM (WIRE) ×1 IMPLANT
HANDLE DETACHMENT COIL (MISCELLANEOUS) ×1 IMPLANT
KIT ENCORE 26 ADVANTAGE (KITS) ×1 IMPLANT
MICROCATH PROGREAT 2.8F 130CM (MICROCATHETER) ×2
OCCLUSION DEVICE PODJ5 (Embolic) ×2 IMPLANT
PACK ANGIOGRAPHY (CUSTOM PROCEDURE TRAY) ×2 IMPLANT
SHEATH BRITE TIP 5FRX11 (SHEATH) ×1 IMPLANT
SYR MEDRAD MARK 7 150ML (SYRINGE) ×1 IMPLANT
TUBING CONTRAST HIGH PRESS 72 (TUBING) ×1 IMPLANT
WIRE GUIDERIGHT .035X150 (WIRE) ×1 IMPLANT

## 2021-06-06 NOTE — Progress Notes (Signed)
Patient disconnected from peritoneal dialysis cycler without complications. Tolerated well, per patient. Effluent clear/yellow, no fibrin noted. Minicap applied,transfer set closed. ?

## 2021-06-06 NOTE — Progress Notes (Signed)
?PROGRESS NOTE ? ? ? ?Misty [name]  PQD:[id number] DOB: 1940-11-11 DOA: 06/04/2021 ?PCP: Gayland Curry, MD  ? ? ?Brief Narrative:  ?Misty [name] is a 81 y.o. female with medical history significant of colon cancer, end-stage renal disease peritoneal dialysis, type 2 diabetes, essential hypertension, chronic diastolic congestive heart failure, history of TIA who present to the hospital with a rectal bleeding and generalized weakness. ?Patient normally walks with a walker, on a walk a few steps, she has frequent falls due to weakness.  She started have rectal bleeding since Sunday, it was fresh red blood mixed with clots.  She did not have any abdominal pain.  Her last bowel movement was Saturday which was normal.  At the same time, patient was also nauseous and vomited a few times, her emesis was not bloody or black. ?Patient also has some dizziness and increased weakness. ?  ?Upon arriving the hospital, patient temperature was 98.4, heart rate 90, respirate 16, blood pressure 112/51, oxygen saturation 97% on room air. ?Her hemoglobin was 7.0, platelets 405, creatinine 6.9, glucose 260. ?Emergency room physician has ordered a blood transfusion. ? ?3/29Still bleeding per rectum this am.  ?3/30 s/p embolization this am. Has no complaints this morning since just got back ? ?Consultants:  ?GI, nephrology , nephrology ? ?Procedures:  ? ?Antimicrobials:  ? ? ?Subjective: ?No sob or cp while lying in bed still.  ? ? ?Objective: ?Vitals:  ? 06/05/21 1740 06/05/21 2145 06/06/21 0423 06/06/21 0641  ?BP: (!) 127/46 (!) 135/47 (!) 139/53   ?Pulse: 80 80 88   ?Resp:  16 16   ?Temp:  97.8 ?F (36.6 ?C) 97.9 ?F (36.6 ?C)   ?TempSrc:  Oral Oral   ?SpO2:  100% 100%   ?Weight:    50.1 kg  ?Height:  '5\' 3"'$  (1.6 m)    ? ? ?Intake/Output Summary (Last 24 hours) at 06/06/2021 0751 ?Last data filed at 06/06/2021 0400 ?Gross per 24 hour  ?Intake 607.59 ml  ?Output --  ?Net 607.59 ml  ? ?Filed Weights  ? 06/06/21 0641  ?Weight: 50.1  kg  ? ? ?Examination: ?Calm, NAD, pale ?Cta no w/r ?Reg s1/s2 no gallop ?Soft benign +bs ?No edema ?Aaoxox3  ?Mood and affect appropriate in current setting  ? ? ?Data Reviewed: I have personally reviewed following labs and imaging studies ? ?CBC: ?Recent Labs  ?Lab 06/04/21 ?1428 06/05/21 ?0129 06/05/21 ?9407 06/05/21 ?6808 06/05/21 ?1406 06/05/21 ?2155 06/06/21 ?8110  ?WBC 11.1* 13.0*  --   --   --   --   --   ?HGB 7.0* 9.8* 8.5* 8.7* 7.5* 7.5* 7.2*  ?HCT 21.9* 28.9*  --   --  22.4*  --   --   ?MCV 98.6 88.9  --   --   --   --   --   ?PLT 405* 285  --   --   --   --   --   ? ?Basic Metabolic Panel: ?Recent Labs  ?Lab 06/04/21 ?1428 06/05/21 ?0129  ?NA 137 132*  ?K 4.4 4.0  ?CL 97* 96*  ?CO2 26 25  ?GLUCOSE 268* 271*  ?BUN 44* 48*  ?CREATININE 6.90* 6.78*  ?CALCIUM 9.3 8.7*  ? ?GFR: ?Estimated Creatinine Clearance: 5.4 mL/min (A) (by C-G formula based on SCr of 6.78 mg/dL (H)). ?Liver Function Tests: ?Recent Labs  ?Lab 06/04/21 ?1630  ?AST 18  ?ALT 10  ?ALKPHOS 43  ?BILITOT 0.4  ?PROT 6.2*  ?ALBUMIN 2.8*  ? ?No results for  input(s): LIPASE, AMYLASE in the last 168 hours. ?No results for input(s): AMMONIA in the last 168 hours. ?Coagulation Profile: ?Recent Labs  ?Lab 06/04/21 ?1630  ?INR 1.1  ? ?Cardiac Enzymes: ?No results for input(s): CKTOTAL, CKMB, CKMBINDEX, TROPONINI in the last 168 hours. ?BNP (last 3 results) ?No results for input(s): PROBNP in the last 8760 hours. ?HbA1C: ?Recent Labs  ?  06/04/21 ?1428  ?HGBA1C 6.6*  ? ?CBG: ?Recent Labs  ?Lab 06/04/21 ?2045 06/05/21 ?0824 06/05/21 ?1129 06/05/21 ?1720 06/05/21 ?2036  ?GLUCAP 251* 285* 207* 177* 360*  ? ?Lipid Profile: ?No results for input(s): CHOL, HDL, LDLCALC, TRIG, CHOLHDL, LDLDIRECT in the last 72 hours. ?Thyroid Function Tests: ?No results for input(s): TSH, T4TOTAL, FREET4, T3FREE, THYROIDAB in the last 72 hours. ?Anemia Panel: ?No results for input(s): VITAMINB12, FOLATE, FERRITIN, TIBC, IRON, RETICCTPCT in the last 72 hours. ?Sepsis Labs: ?No  results for input(s): PROCALCITON, LATICACIDVEN in the last 168 hours. ? ?No results found for this or any previous visit (from the past 240 hour(s)).  ? ? ? ? ? ?Radiology Studies: ?CT Head Wo Contrast ? ?Result Date: 06/04/2021 ?CLINICAL DATA:  Head injury. EXAM: CT HEAD WITHOUT CONTRAST CT CERVICAL SPINE WITHOUT CONTRAST TECHNIQUE: Multidetector CT imaging of the head and cervical spine was performed following the standard protocol without intravenous contrast. Multiplanar CT image reconstructions of the cervical spine were also generated. RADIATION DOSE REDUCTION: This exam was performed according to the departmental dose-optimization program which includes automated exposure control, adjustment of the mA and/or kV according to patient size and/or use of iterative reconstruction technique. COMPARISON:  None. FINDINGS: CT HEAD FINDINGS Brain: No evidence of acute infarction, hemorrhage, hydrocephalus, extra-axial collection or mass lesion/mass effect. Vascular: No hyperdense vessel or unexpected calcification. Skull: Normal. Negative for fracture or focal lesion. Sinuses/Orbits: No acute finding. Other: None. CT CERVICAL SPINE FINDINGS Alignment: Normal. Skull base and vertebrae: No acute fracture. No primary bone lesion or focal pathologic process. Soft tissues and spinal canal: No prevertebral fluid or swelling. No visible canal hematoma. Disc levels: Moderate degenerative disc disease is noted at C3-4, C4-5, C5-6 and C6-7. Upper chest: Negative. Other: Mild degenerative changes are seen involving the posterior facet joints bilaterally. IMPRESSION: No acute intracranial abnormality seen. 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 No acute abnormality is noted. Electronically Signed   By: Marijo Conception M.D.   On: 06/04/2021 16:54  ? ?CT Cervical Spine Wo Contrast ? ?Result Date: 06/04/2021 ?CLINICAL DATA:  Head injury. EXAM: CT HEAD WITHOUT CONTRAST CT CERVICAL SPINE WITHOUT  CONTRAST TECHNIQUE: Multidetector CT imaging of the head and cervical spine was performed following the standard protocol without intravenous contrast. Multiplanar CT image reconstructions of the cervical spine were also generated. RADIATION DOSE REDUCTION: This exam was performed according to the departmental dose-optimization program which includes automated exposure control, adjustment of the mA and/or kV according to patient size and/or use of iterative reconstruction technique. COMPARISON:  None. FINDINGS: CT HEAD FINDINGS Brain: No evidence of acute infarction, hemorrhage, hydrocephalus, extra-axial collection or mass lesion/mass effect. Vascular: No hyperdense vessel or unexpected calcification. Skull: Normal. Negative for fracture or focal lesion. Sinuses/Orbits: No acute finding. Other: None. CT CERVICAL SPINE FINDINGS Alignment: Normal. Skull base and vertebrae: No acute fracture. No primary bone lesion or focal pathologic process. Soft tissues and spinal canal: No prevertebral fluid or swelling. No visible canal hematoma. Disc levels: Moderate degenerative disc disease is noted at C3-4, C4-5, C5-6 and C6-7. Upper chest: Negative.  Other: Mild degenerative changes are seen involving the posterior facet joints bilaterally. IMPRESSION: No acute intracranial abnormality seen. 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 No acute abnormality is noted. Electronically Signed   By: Marijo Conception M.D.   On: 06/04/2021 16:54  ? ?CT ANGIO GI BLEED ? ?Result Date: 06/05/2021 ?CLINICAL DATA:  Evaluate for lower GI bleed. History of rectal bleeding since Sunday. EXAM: CTA ABDOMEN AND PELVIS WITHOUT AND WITH CONTRAST TECHNIQUE: Multidetector CT imaging of the abdomen and pelvis was performed using the standard protocol during bolus administration of intravenous contrast. Multiplanar reconstructed images and MIPs were obtained and reviewed to evaluate the vascular anatomy. RADIATION DOSE  REDUCTION: This exam was performed according to the departmental dose-optimization program which includes automated exposure control, adjustment of the mA and/or kV according to patient size and/or use of

## 2021-06-06 NOTE — Progress Notes (Signed)
[facility] ?[place], [place] ?06/06/21 ? ?Subjective:  ? ?LOS: 2 ?Patient known to our practice from outpatient dialysis.  She is followed for peritoneal dialysis. ?She was seen in the emergency room.  Husband at bedside.  She presented for rectal bleeding and weakness.  She states rectal bleeding started Sunday and has been ongoing off and on. ? ?Update ?Patient seen resting in bed, after procedure ?Husband at bedside ?States procedure went well ?Currently tolerating clear liquid diet ?Denies pain and discomfort ?  ? ?Objective:  ?Vital signs in last 24 hours:  ?Temp:  [97.8 ?F (36.6 ?C)-98.8 ?F (37.1 ?C)] 98.2 ?F (36.8 ?C) (03/30 1520) ?Pulse Rate:  [77-91] 80 (03/30 1520) ?Resp:  [10-18] 18 (03/30 1520) ?BP: (118-181)/(42-68) 165/53 (03/30 1520) ?SpO2:  [89 %-100 %] 99 % (03/30 1520) ?Weight:  [50.1 kg] 50.1 kg (03/30 0850) ? ?Weight change:  ?Filed Weights  ? 06/06/21 0641 06/06/21 0850  ?Weight: 50.1 kg 50.1 kg  ? ? ?Intake/Output: ?  ? ?Intake/Output Summary (Last 24 hours) at 06/06/2021 1524 ?Last data filed at 06/06/2021 0913 ?Gross per 24 hour  ?Intake 8607.59 ml  ?Output 8022 ml  ?Net 585.59 ml  ? ? ? ?Physical Exam: ?General:  No acute distress, laying in the bed  ?HEENT  anicteric, moist oral mucous membrane  ?Pulm/lungs  normal breathing effort, lungs are clear to auscultation  ?CVS/Heart  regular rhythm, no rub or gallop  ?Abdomen:   Soft, PD catheter in place  ?Extremities:  No peripheral edema  ?Neurologic:  Alert, oriented, able to follow commands  ?Skin:  No acute rashes  ? ? ? ?Basic Metabolic Panel: ? ?Recent Labs  ?Lab 06/04/21 ?1428 06/05/21 ?0129  ?NA 137 132*  ?K 4.4 4.0  ?CL 97* 96*  ?CO2 26 25  ?GLUCOSE 268* 271*  ?BUN 44* 48*  ?CREATININE 6.90* 6.78*  ?CALCIUM 9.3 8.7*  ? ? ? ? ?CBC: ?Recent Labs  ?Lab 06/04/21 ?1428 06/05/21 ?0129 06/05/21 ?7858 06/05/21 ?8502 06/05/21 ?1406 06/05/21 ?2155 06/06/21 ?7741  ?WBC 11.1* 13.0*  --   --   --   --   --   ?HGB 7.0* 9.8* 8.5*  8.7* 7.5* 7.5* 7.2*  ?HCT 21.9* 28.9*  --   --  22.4*  --   --   ?MCV 98.6 88.9  --   --   --   --   --   ?PLT 405* 285  --   --   --   --   --   ? ? ? ? No results found for: HEPBSAG, HEPBSAB, HEPBIGM ? ? ? ?Microbiology: ? ?No results found for this or any previous visit (from the past 240 hour(s)). ? ?Coagulation Studies: ?Recent Labs  ?  06/04/21 ?1630  ?LABPROT 13.7  ?INR 1.1  ? ? ? ?Urinalysis: ?Recent Labs  ?  06/06/21 ?0725  ?COLORURINE YELLOW*  ?LABSPEC 1.009  ?PHURINE 6.0  ?GLUCOSEU >=500*  ?HGBUR SMALL*  ?BILIRUBINUR NEGATIVE  ?KETONESUR NEGATIVE  ?PROTEINUR 100*  ?NITRITE NEGATIVE  ?LEUKOCYTESUR LARGE*  ?  ? ? ?Imaging: ?CT Head Wo Contrast ? ?Result Date: 06/04/2021 ?CLINICAL DATA:  Head injury. EXAM: CT HEAD WITHOUT CONTRAST CT CERVICAL SPINE WITHOUT CONTRAST TECHNIQUE: Multidetector CT imaging of the head and cervical spine was performed following the standard protocol without intravenous contrast. Multiplanar CT image reconstructions of the cervical spine were also generated. RADIATION DOSE REDUCTION: This exam was performed according to the departmental dose-optimization program which includes automated exposure control, adjustment of  the mA and/or kV according to patient size and/or use of iterative reconstruction technique. COMPARISON:  None. FINDINGS: CT HEAD FINDINGS Brain: No evidence of acute infarction, hemorrhage, hydrocephalus, extra-axial collection or mass lesion/mass effect. Vascular: No hyperdense vessel or unexpected calcification. Skull: Normal. Negative for fracture or focal lesion. Sinuses/Orbits: No acute finding. Other: None. CT CERVICAL SPINE FINDINGS Alignment: Normal. Skull base and vertebrae: No acute fracture. No primary bone lesion or focal pathologic process. Soft tissues and spinal canal: No prevertebral fluid or swelling. No visible canal hematoma. Disc levels: Moderate degenerative disc disease is noted at C3-4, C4-5, C5-6 and C6-7. Upper chest: Negative. Other: Mild  degenerative changes are seen involving the posterior facet joints bilaterally. IMPRESSION: No acute intracranial abnormality seen. 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 No acute abnormality is noted. Electronically Signed   By: Marijo Conception M.D.   On: 06/04/2021 16:54  ? ?CT Cervical Spine Wo Contrast ? ?Result Date: 06/04/2021 ?CLINICAL DATA:  Head injury. EXAM: CT HEAD WITHOUT CONTRAST CT CERVICAL SPINE WITHOUT CONTRAST TECHNIQUE: Multidetector CT imaging of the head and cervical spine was performed following the standard protocol without intravenous contrast. Multiplanar CT image reconstructions of the cervical spine were also generated. RADIATION DOSE REDUCTION: This exam was performed according to the departmental dose-optimization program which includes automated exposure control, adjustment of the mA and/or kV according to patient size and/or use of iterative reconstruction technique. COMPARISON:  None. FINDINGS: CT HEAD FINDINGS Brain: No evidence of acute infarction, hemorrhage, hydrocephalus, extra-axial collection or mass lesion/mass effect. Vascular: No hyperdense vessel or unexpected calcification. Skull: Normal. Negative for fracture or focal lesion. Sinuses/Orbits: No acute finding. Other: None. CT CERVICAL SPINE FINDINGS Alignment: Normal. Skull base and vertebrae: No acute fracture. No primary bone lesion or focal pathologic process. Soft tissues and spinal canal: No prevertebral fluid or swelling. No visible canal hematoma. Disc levels: Moderate degenerative disc disease is noted at C3-4, C4-5, C5-6 and C6-7. Upper chest: Negative. Other: Mild degenerative changes are seen involving the posterior facet joints bilaterally. IMPRESSION: No acute intracranial abnormality seen. 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 No acute abnormality is noted. Electronically Signed   By: Marijo Conception M.D.   On: 06/04/2021 16:54   ? ?PERIPHERAL VASCULAR CATHETERIZATION ? ?Result Date: 06/06/2021 ?See surgical note for result. ? ?CT ANGIO GI BLEED ? ?Result Date: 06/05/2021 ?CLINICAL DATA:  Evaluate for lower GI bleed. History of rectal bleeding since Sunday. EXAM: CTA ABDOMEN AND PELVIS WITHOUT AND WITH CONTRAST TECHNIQUE: Multidetector CT imaging of the abdomen and pelvis was performed using the standard protocol during bolus administration of intravenous contrast. Multiplanar reconstructed images and MIPs were obtained and reviewed to evaluate the vascular anatomy. RADIATION DOSE REDUCTION: This exam was performed according to the departmental dose-optimization program which includes automated exposure control, adjustment of the mA and/or kV according to patient size and/or use of iterative reconstruction technique. CONTRAST:  55m OMNIPAQUE IOHEXOL 350 MG/ML SOLN COMPARISON:  None. FINDINGS: VASCULAR Aorta: Extensive abdominal aortic atherosclerotic calcifications. There is a nonocclusive calcification within the lumen of the infrarenal abdominal aorta measuring 2 x 1.2 cm, image 56/4. Normal caliber aorta without aneurysm, dissection, or vasculitis. Celiac: Patent without evidence of aneurysm, dissection, vasculitis or significant stenosis. SMA: Calcified plaque at the origin of the SMA results in approximately 50% stenosis. Renals: Right renal artery is patent without significant stenosis. Calcified plaque at the origin of the left renal artery results in  approximately 50% stenosis. 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 Inflow: Patent without evidence of aneurysm, dissection, vasculitis or significant stenosis. Proximal Outflow: Bilateral common femoral and visualized portions of the superficial and profunda femoral arteries are patent without evidence of aneurysm, dissection, vasculitis or significant stenosis. Veins: No obvious venous abnormality within the limitations of this arterial phase study.  Review of the MIP images confirms the above findings. NON-VASCULAR Lower chest: No acute abnormality. Hepatobiliary: No focal liver abnormality. Stone within the gallbladder measures 2.3 cm. No signs of gallbladder wa

## 2021-06-06 NOTE — Op Note (Signed)
[facility] ? Percutaneous Study/Intervention Procedural Note ? ? ? ? ?Surgeon(s): Leotis Pain  ? ?Assistants: none ? ?Pre-operative Diagnosis: 1. Lower GI bleed ?2.  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 ?Procedure(s) Performed: ?            1.  Ultrasound guidance for vascular access right femoral artery ?            2.  Catheter placement into a tertiary left hypogastric artery branch from right femoral approach ?            3.  Aortogram and selective angiogram of the left hypogastric artery ? 4.  Angioplasty of the right common iliac artery with 6 mm diameter angioplasty balloon ?            5.  Microbead embolization of left internal iliac artery branches to the rectum with 0.5 cc of 500-700 ? polyvinyl alcohol beads as well as 2 Ruby coils. ?            6.  StarClose closure device right femoral artery ? ?Anesthesia: Moderate conscious sedation for approximately 43 minutes using 1.75 mg of Versed and 75 mcg of Fentanyl             ? ?EBL: 5 cc ? ?Fluoro Time: 9.9 minutes ? ?Contrast: 35 cc ?             ?Indications:  Patient is a 81 y.o.female with brisk lower GI bleeding with resultant anemia. The patient has a CT scan showing active bleeding in the rectal or rectosigmoid area. The patient is brought in for angiography for further evaluation and potential treatment. Risks and benefits are discussed and informed consent is obtained ? ?Procedure:  The patient was identified and appropriate procedural time out was performed.  The patient was then placed supine on the table and prepped and draped in the usual sterile fashion. Moderate conscious sedation was administered during a face to face encounter with the patient throughout the procedure with my supervision of the RN administering medicines and monitoring the patient's vital signs, pulse  oximetry, telemetry and mental status throughout from the start of the procedure until the patient was taken to the recovery room.  Ultrasound was used to evaluate the right common femoral artery.  It was patent .  A digital ultrasound image was acquired.  A Seldinger needle was used to access the right common femoral artery under direct ultrasound guidance and a permanent image was performed.  A 0.035 J wire was advanced without resistance and a 5Fr sheath was placed.  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  Using a Kumpe catheter and a Glidewire was able to cross this lesion and get a wire up into the aorta.  In the aorta just below the renal arteries, imaging showed yet another high-grade stenosis of greater than 90%.  The right hypogastric artery was occluded as expected.  The left hypogastric artery was highly diseased with stenosis of greater than 90% in the midsegment.  Angioplasty was then performed of the right common iliac artery with a 6 mm diameter by 4 cm length angioplasty balloon inflated to 12 atm for 1 minute to be able to cross the aortic  bifurcation and cannulate the left hypogastric artery.  After balloon angioplasty, a rim catheter was advanced over a 0.018 advantage wire and eventually brought down into the left iliac artery where selective imaging showed the origin of the left hypogastric artery well.  The left hypogastric artery is then cannulated with a rim catheter and selective imaging showed a greater than 90% stenosis after the primary branches but it did show the large branches feeding the area down in the rectum that had been seen on CT angiogram to have active bleeding. Based on her continued bleeding and the CT scan I elected to treat this area with embolization. I initially advanced the Pro-Great microcatheter out the initial branches of the internal iliac  artery and then down into a secondary and then what was essentially a tertiary branch of the internal iliac artery that was feeding the rectal area.  This was a heavily diseased artery and the prograde microcatheter would not get all the way out to the rectum itself but was within this artery feeding only the rectal and rectosigmoid area.  I then instilled 0.5 cc of 500 700 ?m polyvinyl alcohol beads in this location.  I then followed this by 2 small Ruby coils.  The first was 2 mm in diameter by 2 cm in length and the second was a 5 cm packing coil.  This occluded the rectal branch in this area without impairing other blood flow to the pelvis that was identifiable. I elected to terminate the procedure. The diagnostic catheter was removed. StarClose closure device was deployed in usual fashion with excellent hemostatic result. The patient was taken to the recovery room in stable condition having tolerated the procedure well. ? ? ? ? ? ? ?Disposition: Patient was taken to the recovery room in stable condition having tolerated the procedure well. ? ?Complications:  None ? ?Leotis Pain ?06/06/2021 ?9:55 AM ? ? ?This note was created with Dragon Medical transcription system. Any errors in dictation are purely unintentional.  ?

## 2021-06-06 NOTE — Progress Notes (Signed)
? ?[name] , MD ?[address] [place], [place], [place], [address] ?[address], [place], [place], [place], [address] ?Phone: [phone]  ?Fax: [phone] ? ? ?Misty [name] is being followed for lower GI bleed day 1 of follow up  ? ?Subjective: ?Denies any pain after the procedure no more rectal bleeding.  Doing well. ? ? ?Objective: ?Vital signs in last 24 hours: ?Vitals:  ? 06/06/21 0950 06/06/21 1000 06/06/21 1030 06/06/21 1046  ?BP: (!) 124/46 (!) 151/68 (!) 174/65 (!) 158/51  ?Pulse: 78 83  84  ?Resp: 10 11    ?Temp:    98.4 ?F (36.9 ?C)  ?TempSrc:      ?SpO2: 100% 95%  99%  ?Weight:      ?Height:      ? ?Weight change:  ? ?Intake/Output Summary (Last 24 hours) at 06/06/2021 1130 ?Last data filed at 06/06/2021 0913 ?Gross per 24 hour  ?Intake 8607.59 ml  ?Output 8022 ml  ?Net 585.59 ml  ? ? ? ?Exam: ?Heart:: Regular rate and rhythm ?Lungs: normal and clear to auscultation ?Abdomen: soft, nontender, normal bowel sounds ? ? ?Lab Results: ?'@LABTEST2'$ @ ?Micro Results: ?No results found for this or any previous visit (from the past 240 hour(s)). ?Studies/Results: ?CT Head Wo Contrast ? ?Result Date: 06/04/2021 ?CLINICAL DATA:  Head injury. EXAM: CT HEAD WITHOUT CONTRAST CT CERVICAL SPINE WITHOUT CONTRAST TECHNIQUE: Multidetector CT imaging of the head and cervical spine was performed following the standard protocol without intravenous contrast. Multiplanar CT image reconstructions of the cervical spine were also generated. RADIATION DOSE REDUCTION: This exam was performed according to the departmental dose-optimization program which includes automated exposure control, adjustment of the mA and/or kV according to patient size and/or use of iterative reconstruction technique. COMPARISON:  None. FINDINGS: CT HEAD FINDINGS Brain: No evidence of acute infarction, hemorrhage, hydrocephalus, extra-axial collection or mass lesion/mass effect. Vascular: No hyperdense vessel or unexpected calcification. Skull:  Normal. Negative for fracture or focal lesion. Sinuses/Orbits: No acute finding. Other: None. CT CERVICAL SPINE FINDINGS Alignment: Normal. Skull base and vertebrae: No acute fracture. No primary bone lesion or focal pathologic process. Soft tissues and spinal canal: No prevertebral fluid or swelling. No visible canal hematoma. Disc levels: Moderate degenerative disc disease is noted at C3-4, C4-5, C5-6 and C6-7. Upper chest: Negative. Other: Mild degenerative changes are seen involving the posterior facet joints bilaterally. IMPRESSION: No acute intracranial abnormality seen. 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 No acute abnormality is noted. Electronically Signed   By: Marijo Conception M.D.   On: 06/04/2021 16:54  ? ?CT Cervical Spine Wo Contrast ? ?Result Date: 06/04/2021 ?CLINICAL DATA:  Head injury. EXAM: CT HEAD WITHOUT CONTRAST CT CERVICAL SPINE WITHOUT CONTRAST TECHNIQUE: Multidetector CT imaging of the head and cervical spine was performed following the standard protocol without intravenous contrast. Multiplanar CT image reconstructions of the cervical spine were also generated. RADIATION DOSE REDUCTION: This exam was performed according to the departmental dose-optimization program which includes automated exposure control, adjustment of the mA and/or kV according to patient size and/or use of iterative reconstruction technique. COMPARISON:  None. FINDINGS: CT HEAD FINDINGS Brain: No evidence of acute infarction, hemorrhage, hydrocephalus, extra-axial collection or mass lesion/mass effect. Vascular: No hyperdense vessel or unexpected calcification. Skull: Normal. Negative for fracture or focal lesion. Sinuses/Orbits: No acute finding. Other: None. CT CERVICAL SPINE FINDINGS Alignment: Normal. Skull base and vertebrae: No acute fracture. No primary bone lesion or focal pathologic process. Soft tissues  and spinal canal: No prevertebral fluid or swelling. No visible  canal hematoma. Disc levels: Moderate degenerative disc disease is noted at C3-4, C4-5, C5-6 and C6-7. Upper chest: Negative. Other: Mild degenerative changes are seen involving the posterior facet joints bilaterally. IMPRESSION: No acute intracranial abnormality seen. 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 No acute abnormality is noted. Electronically Signed   By: Marijo Conception M.D.   On: 06/04/2021 16:54  ? ?PERIPHERAL VASCULAR CATHETERIZATION ? ?Result Date: 06/06/2021 ?See surgical note for result. ? ?CT ANGIO GI BLEED ? ?Result Date: 06/05/2021 ?CLINICAL DATA:  Evaluate for lower GI bleed. History of rectal bleeding since Sunday. EXAM: CTA ABDOMEN AND PELVIS WITHOUT AND WITH CONTRAST TECHNIQUE: Multidetector CT imaging of the abdomen and pelvis was performed using the standard protocol during bolus administration of intravenous contrast. Multiplanar reconstructed images and MIPs were obtained and reviewed to evaluate the vascular anatomy. RADIATION DOSE REDUCTION: This exam was performed according to the departmental dose-optimization program which includes automated exposure control, adjustment of the mA and/or kV according to patient size and/or use of iterative reconstruction technique. CONTRAST:  91m OMNIPAQUE IOHEXOL 350 MG/ML SOLN COMPARISON:  None. FINDINGS: VASCULAR Aorta: Extensive abdominal aortic atherosclerotic calcifications. There is a nonocclusive calcification within the lumen of the infrarenal abdominal aorta measuring 2 x 1.2 cm, image 56/4. Normal caliber aorta without aneurysm, dissection, or vasculitis. Celiac: Patent without evidence of aneurysm, dissection, vasculitis or significant stenosis. SMA: Calcified plaque at the origin of the SMA results in approximately 50% stenosis. Renals: Right renal artery is patent without significant stenosis. Calcified plaque at the origin of the left renal artery results in approximately 50% stenosis. IMA: Appears  chronically occluded with calcified plaque at the origin. Inflow: Patent without evidence of aneurysm, dissection, vasculitis or significant stenosis. Proximal Outflow: Bilateral common femoral and visualized portions of the superficial and profunda femoral arteries are patent without evidence of aneurysm, dissection, vasculitis or significant stenosis. Veins: No obvious venous abnormality within the limitations of this arterial phase study. Review of the MIP images confirms the above findings. NON-VASCULAR Lower chest: No acute abnormality. Hepatobiliary: No focal liver abnormality. Stone within the gallbladder measures 2.3 cm. No signs of gallbladder wall inflammation. No bile duct dilatation. Pancreas: Unremarkable. No pancreatic ductal dilatation or surrounding inflammatory changes. Spleen: Normal in size without focal abnormality. Adrenals/Urinary Tract: Normal adrenal glands. Bilateral renal atrophy. No nephrolithiasis, hydronephrosis or mass identified. Urinary bladder is unremarkable. Stomach/Bowel: Stomach appears nondistended. There is no bowel wall thickening, inflammation, or distension. Anastomotic suture line is identified at the level of the rectum. Distal to the anastomotic suture line there is active extravasation of contrast material on the arterial phase images, image 205/5. Further accumulation of intraluminal contrast material is identified on the delayed portal venous images, image 198/4. Lymphatic: No signs of abdominopelvic adenopathy. Reproductive: Uterus and bilateral adnexa are unremarkable. Other: There is a fat density mass within the central aspect of the abdomen which measures 5.1 x 3.9 cm and likely represents an area of remote fat necrosis, image 96/4. Fluid attenuating structure in the right side of pelvis is indeterminate measuring 5.2 x 3.0 cm, image 163/4. Musculoskeletal: The bones appear diffusely osteopenic. Multilevel thoracolumbar degenerative disc disease. IMPRESSION: 1.  There is active intraluminal extravasation of contrast material within the rectum just distal to anastomotic suture line. This is compatible with active GI bleeding within the rectum. 2. Extensive aortic ath

## 2021-06-06 NOTE — TOC Initial Note (Signed)
Transition of Care (TOC) - Initial/Assessment Note  ? ? ?Patient Details  ?Name: Misty [name] ?MRN: [id number] ?Date of Birth: 1940/08/29 ? ?Transition of Care (TOC) CM/SW Contact:    ?Laurena Slimmer, RN ?Phone Number: ?06/06/2021, 12:19 PM ? ?Clinical Narrative:    ?Spoke with patient in room. Husband at bedside. Admitted for weakness and  rectal bleeding. Advised about pending Pt evaluation and possible recommendations for discharge. Patient refuses rehab or HH. States her daughter is a Emergency planning/management officer. Does not feel any services will be needed.  ? ? ?Admitted for: Weakness and rectal bleeding ?Admitted from: Home  ?PCP: Dr. Gayland Curry ?Ph[facility]acy: [facility] [place] ?Current home health/prior home health/DME: Not active with services, No DME ? ? ?Expected Discharge Plan: Home/Self Care ?Barriers to Discharge: Continued Medical Work up ? ? ?Patient Goals and CMS Choice ?Patient states their goals for this hospitalization and ongoing recovery are:: To return home ?CMS Medicare.gov Compare Post Acute Care list provided to:: Patient ?Choice offered to / list presented to : Patient, Spouse ? ?Expected Discharge Plan and Services ?Expected Discharge Plan: Home/Self Care ?  ?Discharge Planning Services: CM Consult ?  ?Living arrangements for the past 2 months: [place] ?                ?  ?  ?  ?  ?  ?  ?  ?  ?  ?  ? ?Prior Living Arrangements/Services ?Living arrangements for the past 2 months: [place] ?Lives with:: Spouse ?Patient language and need for interpreter reviewed:: Yes ?Do you feel safe going back to the place where you live?: Yes      ?Need for Family Participation in Patient Care: No (Comment) ?Care giver support system in place?: Yes (comment) ?  ?Criminal Activity/Legal Involvement Pertinent to Current Situation/Hospitalization: No - Comment as needed ? ?Activities of Daily Living ?Home Assistive Devices/Equipment: Eyeglasses, Kasandra Knudsen (specify quad or straight), Walker  (specify type) ?ADL Screening (condition at time of admission) ?Patient's cognitive ability adequate to safely complete daily activities?: Yes ?Is the patient deaf or have difficulty hearing?: No ?Does the patient have difficulty seeing, even when wearing glasses/contacts?: No ?Does the patient have difficulty concentrating, remembering, or making decisions?: No ?Patient able to express need for assistance with ADLs?: Yes ?Does the patient have difficulty dressing or bathing?: No ?Independently performs ADLs?: Yes (appropriate for developmental age) ?Does the patient have difficulty walking or climbing stairs?: No ?Weakness of Legs: None ?Weakness of Arms/Hands: None ? ?Permission Sought/Granted ?  ?  ?   ?   ?   ?   ? ?Emotional Assessment ?Appearance:: Appears older than stated age ?Attitude/Demeanor/Rapport: Engaged ?Affect (typically observed): Pleasant ?Orientation: : Oriented to Self, Oriented to Place, Oriented to  Time, Oriented to Situation ?Alcohol / Substance Use: Never Used ?Psych Involvement: No (comment) ? ?Admission diagnosis:  Acute blood loss anemia [D62] ?Lower GI bleed [K92.2] ?Symptomatic anemia [D64.9] ?Patient Active Problem List  ? Diagnosis Date Noted  ? Pressure injury of skin 06/06/2021  ? Acute blood loss anemia 06/04/2021  ? Acute lower GI bleeding 06/04/2021  ? ESRD on peritoneal dialysis ([place]) 06/04/2021  ? Chronic diastolic CHF (congestive heart failure) ([place]) 06/04/2021  ? Acute diastolic CHF (congestive heart failure) ([place]) 06/21/2020  ? CKD stage 5 due to type 2 diabetes mellitus ([place]) 06/21/2020  ? Acute respiratory failure ([place]) 06/21/2020  ? Rectal bleeding 05/02/2020  ? Type II diabetes mellitus with renal manifestations ([place])  05/02/2020  ? Hyperkalemia 05/02/2020  ? Anemia in stage 4 chronic kidney disease ([place]) 10/15/2018  ? Acute kidney failure ([place]) 10/15/2018  ? Fracture of scapular body 10/15/2018  ? Chronic kidney disease, stage 4 (severe) ([place]) 08/31/2018  ? Disorder of  mineral metabolism, unspecified 08/31/2018  ? 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 Nondisplaced fracture of acromial process, unspecified shoulder, initial encounter for closed fracture 08/19/2017  ? Hyperuricemia 05/22/2017  ? Multiple myeloma ([place]) 05/19/2017  ? Pseudophakia of both eyes 01/11/2015  ? PCO (posterior capsular opacification) 01/11/2015  ? Paresthesia 01/19/2014  ? Wrist pain 01/04/2014  ? PVD (posterior vitreous detachment), both eyes 08/10/2012  ? Glaucoma suspect of both eyes 08/10/2012  ? Diabetes mellitus ([place]) 03/26/2010  ? Hypertension 04/03/2009  ? 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 Vertigo 10/31/2008  ? Hypercholesterolemia 10/31/2008  ? ?PCP:  Gayland Curry, MD ?Ph[facility]acy:   ?[id number] [place] [address]?Phone: [phone]: [phone]? ? ? ? ?Social Determinants of Health (SDOH) Interventions ?  ? ?Readmission Risk Interventions ? ?  06/06/2021  ? 11:55 AM  ?Readmission Risk Prevention Plan  ?Transportation Screening Complete  ?[place] [facility] Consult Complete  ?Palliative Care Screening Not Applicable  ?Medication Review (Press photographer Complete  ? ? ? ?

## 2021-06-07 ENCOUNTER — Encounter: Payer: Self-pay | Admitting: Vascular Surgery

## 2021-06-07 DIAGNOSIS — K922 Gastrointestinal hemorrhage, unspecified: Secondary | ICD-10-CM | POA: Diagnosis not present

## 2021-06-07 DIAGNOSIS — D62 Acute posthemorrhagic anemia: Secondary | ICD-10-CM | POA: Diagnosis not present

## 2021-06-07 LAB — PREPARE RBC (CROSSMATCH)

## 2021-06-07 LAB — GLUCOSE, CAPILLARY
Glucose-Capillary: 181 mg/dL — ABNORMAL HIGH (ref 70–99)
Glucose-Capillary: 187 mg/dL — ABNORMAL HIGH (ref 70–99)
Glucose-Capillary: 183 mg/dL — ABNORMAL HIGH (ref 70–99)
Glucose-Capillary: 246 mg/dL — ABNORMAL HIGH (ref 70–99)

## 2021-06-07 LAB — CBC
HCT: 26.3 % — ABNORMAL LOW (ref 36.0–46.0)
Hemoglobin: 9.2 g/dL — ABNORMAL LOW (ref 12.0–15.0)
MCH: 29.9 pg (ref 26.0–34.0)
MCHC: 35 g/dL (ref 30.0–36.0)
MCV: 85.4 fL (ref 80.0–100.0)
Platelets: 195 10*3/uL (ref 150–400)
RBC: 3.08 MIL/uL — ABNORMAL LOW (ref 3.87–5.11)
RDW: 15.9 % — ABNORMAL HIGH (ref 11.5–15.5)
WBC: 9.7 10*3/uL (ref 4.0–10.5)
nRBC: 0.2 % (ref 0.0–0.2)

## 2021-06-07 LAB — HEMOGLOBIN
Hemoglobin: 7.5 g/dL — ABNORMAL LOW (ref 12.0–15.0)
Hemoglobin: 9.9 g/dL — ABNORMAL LOW (ref 12.0–15.0)
Hemoglobin: 8.2 g/dL — ABNORMAL LOW (ref 12.0–15.0)

## 2021-06-07 LAB — BASIC METABOLIC PANEL
Anion gap: 11 (ref 5–15)
BUN: 38 mg/dL — ABNORMAL HIGH (ref 8–23)
CO2: 22 mmol/L (ref 22–32)
Calcium: 7.7 mg/dL — ABNORMAL LOW (ref 8.9–10.3)
Chloride: 99 mmol/L (ref 98–111)
Creatinine, Ser: 6.71 mg/dL — ABNORMAL HIGH (ref 0.44–1.00)
GFR, Estimated: 6 mL/min — ABNORMAL LOW (ref >60)
Glucose, Bld: 197 mg/dL — ABNORMAL HIGH (ref 70–99)
Potassium: 4.2 mmol/L (ref 3.5–5.1)
Sodium: 132 mmol/L — ABNORMAL LOW (ref 135–145)

## 2021-06-07 MED ORDER — SODIUM CHLORIDE 0.9% IV SOLUTION: Freq: Once | INTRAVENOUS | Status: AC

## 2021-06-07 NOTE — Evaluation (Signed)
Occupational Therapy Evaluation ?Patient Details ?Name: Misty [name] ?MRN: [id number] ?DOB: Aug 07, 1940 ?Today's Date: 06/07/2021 ? ? ?History of Present Illness Pt is a 81 y.o. female who presents to the ED w/ complaints of feeling weak and lightheaded. She reports bright red blood coming out of rectum. Admitted for acute blood loss anemia; Pt baseline Hb was 9-10, dropped down to 7 on (3/28). PmHx: Aortic Valve Stenosis, ESRD, GERD, HLD, Hyperparathyroidism, HTN, OA, T2DM, TIA.  ? ?Clinical Impression ?  ?Chart reviewed, pt greeted in chair agreeable to OT evaluation. PTA pt required assist from husband following surgery in November 2022 and has a history of falls. Pt and husband provided education re; home modifications, falls preventions, energy conservation, recommended DME. While pt did require assist for ADL prior to admission, pt is performing functional mobility below PLOF. Pt would also benefit from [facility] to address functional deficits and to assess home set up to facilitate decrease risk of falling and optimal independence in ADL completion. Pt and husband are in agreement. Pt is left in bed, NAD, all needs met. OT will continue to follow acutely.  ?   ? ?Recommendations for follow up therapy are one component of a multi-disciplinary discharge planning process, led by the attending physician.  Recommendations may be updated based on patient status, additional functional criteria and insurance authorization.  ? ?Follow Up Recommendations ? Home health OT  ?  ?Assistance Recommended at Discharge Frequent or constant Supervision/Assistance  ?Patient can return home with the following A little help with walking and/or transfers;A little help with bathing/dressing/bathroom;Assistance with cooking/housework;Assist for transportation;Help with stairs or ramp for entrance;Direct supervision/assist for medications management ? ?  ?Functional Status Assessment ? Patient has had a recent decline in their functional  status and demonstrates the ability to make significant improvements in function in a reasonable and predictable amount of time.  ?Equipment Recommendations ? BSC/3in1;Other (comment) (pt declined at this time continue to recommend 3 in 1 commode)  ?  ?Recommendations for Other Services   ? ? ?  ?Precautions / Restrictions Precautions ?Precautions: Fall ?Restrictions ?Weight Bearing Restrictions: No  ? ?  ? ?Mobility Bed Mobility ?Overal bed mobility: Needs Assistance ?Bed Mobility: Sit to Supine ?  ?  ?  ?Sit to supine: Supervision, HOB elevated ?  ?  ?  ? ?Transfers ?Overall transfer level: Needs assistance ?Equipment used: Rolling walker (2 wheels) ?Transfers: Sit to/from Stand ?Sit to Stand: Min guard ?  ?  ?  ?  ?  ?  ?  ? ?  ?Balance Overall balance assessment: Needs assistance ?Sitting-balance support: Bilateral upper extremity supported, Feet supported ?Sitting balance-Leahy Scale: Fair ?  ?  ?Standing balance support: Bilateral upper extremity supported, During functional activity, Reliant on assistive device for balance ?Standing balance-Leahy Scale: Fair ?  ?  ?  ?  ?  ?  ?  ?  ?  ?  ?  ?  ?   ? ?ADL either performed or assessed with clinical judgement  ? ?ADL Overall ADL's : Needs assistance/impaired ?Eating/Feeding: Set up;Sitting ?  ?Grooming: Wash/dry hands;Wash/dry face;Oral care;Standing;Supervision/safety;Min guard ?Grooming Details (indicate cue type and reason): with RW at sink level ?  ?  ?  ?  ?  ?  ?Lower Body Dressing: Maximal assistance ?Lower Body Dressing Details (indicate cue type and reason): anticipated ?Toilet Transfer: Supervision/safety;Min guard;Ambulation ?Toilet Transfer Details (indicate cue type and reason): simulated ?  ?  ?  ?  ?Functional mobility during ADLs: Supervision/safety;Min guard;Rolling walker (2  wheels) ?   ? ? ? ?Vision Patient Visual Report: No change from baseline ?   ?   ?Perception   ?  ?Praxis   ?  ? ?Pertinent Vitals/Pain Pain Assessment ?Pain Assessment:  No/denies pain  ? ? ? ?Hand Dominance   ?  ?Extremity/Trunk Assessment Upper Extremity Assessment ?Upper Extremity Assessment: Generalized weakness ?  ?Lower Extremity Assessment ?Lower Extremity Assessment: Generalized weakness ?RLE Deficits / Details: generalized 3+/5 strength ?LLE Deficits / Details: generalized 3/5 strength ?  ?  ?  ?Communication Communication ?Communication: No difficulties ?  ?Cognition Arousal/Alertness: Awake/alert ?Behavior During Therapy: [facility] for tasks assessed/performed ?Overall Cognitive Status: Within Functional Limits for tasks assessed ?  ?  ?  ?  ?  ?  ?  ?  ?  ?  ?  ?  ?  ?  ?  ?  ?  ?  ?  ?General Comments    ? ?  ?Exercises   ?  ?Shoulder Instructions    ? ? ?Home Living Family/patient expects to be discharged to:: Private residence ?Living Arrangements: Spouse/significant other ?Available Help at Discharge: Family;Available 24 hours/day ?Type of Home: House ?Home Access: Level entry ?  ?  ?Home Layout: One level ?  ?  ?Bathroom Shower/Tub: Walk-in shower ?  ?Bathroom Toilet: Handicapped height ?  ?  ?Home Equipment: Rollator (4 wheels);Shower seat;Wheelchair - manual ?  ?  ?  ? ?  ?Prior Functioning/Environment Prior Level of Function : Needs assist ?  ?  ?  ?Physical Assist : Mobility (physical);ADLs (physical) ?  ?ADLs (physical): Bathing;Dressing;Toileting;IADLs ?Mobility Comments: pt amb with rollator at home however reports frequently not using rollator; transport wheelchair for community distances ?ADLs Comments: assist with LB dressing from husband, supervision in shower ?  ? ?  ?  ?OT Problem List: Decreased strength;Impaired balance (sitting and/or standing);Decreased knowledge of precautions;Impaired vision/perception;Decreased activity tolerance;Decreased coordination ?  ?   ?OT Treatment/Interventions: Self-care/ADL training;Visual/perceptual remediation/compensation;Therapeutic exercise;Patient/family education;Balance training;Energy conservation;DME and/or AE  instruction;Therapeutic activities  ?  ?OT Goals(Current goals can be found in the care plan section) Acute Rehab OT Goals ?Patient Stated Goal: go home ?OT Goal Formulation: With patient ?Time For Goal Achievement: 06/21/21 ?Potential to Achieve Goals: Good ?ADL Goals ?Pt Will Perform Grooming: with supervision ?Pt Will Perform Upper Body Dressing: with supervision ?Pt Will Perform Lower Body Dressing: with supervision ?Pt Will Transfer to Toilet: with supervision ?Pt Will Perform Toileting - Clothing Manipulation and hygiene: with supervision  ?OT Frequency: Min 3X/week ?  ? ?Co-evaluation   ?  ?  ?  ?  ? ?  ?AM-PAC OT "6 Clicks" Daily Activity     ?Outcome Measure Help from another person eating meals?: None ?Help from another person taking care of personal grooming?: None ?Help from another person toileting, which includes using toliet, bedpan, or urinal?: A Little ?Help from another person bathing (including washing, rinsing, drying)?: A Little ?Help from another person to put on and taking off regular upper body clothing?: A Little ?Help from another person to put on and taking off regular lower body clothing?: A Lot ?6 Click Score: 19 ?  ?End of Session Equipment Utilized During Treatment: Rolling walker (2 wheels) ?Nurse Communication: Mobility status ? ?Activity Tolerance: Patient tolerated treatment well ?Patient left: in bed;with bed alarm set;with nursing/sitter in room;with call bell/phone within reach;with family/visitor present ? ?OT Visit Diagnosis: Unsteadiness on feet (R26.81);Muscle weakness (generalized) (M62.81);History of falling (Z91.81)  ?              ?  Time: 8948-3475 ?OT Time Calculation (min): 22 min ?Charges:  OT General Charges ?$OT Visit: 1 Visit ?OT Evaluation ?$OT Eval Moderate Complexity: 1 Mod ?OT Treatments ?$Self Care/Home Management : 8-22 mins ? ?Shanon Payor, OTD OTR/L  ?06/07/21, 4:04 PM  ?

## 2021-06-07 NOTE — Progress Notes (Signed)
[facility] ?[place], [place] ?06/07/21 ? ?Subjective:  ? ?LOS: 3 ?Patient known to our practice from outpatient dialysis.  She is followed for peritoneal dialysis. ?She was seen in the emergency room.  Husband at bedside.  She presented for rectal bleeding and weakness.  She states rectal bleeding started Sunday and has been ongoing off and on. ? ?Update ?Patient currently resting quietly ?Husband at bedside ?Patient currently receiving additional unit of blood. ?Remains on clear liquid diet tolerating well. ?Requesting solid food ?Continues to complain of dark stools, soft/liquidy ?  ? ?Objective:  ?Vital signs in last 24 hours:  ?Temp:  [98.2 ?F (36.8 ?C)-99 ?F (37.2 ?C)] 98.7 ?F (37.1 ?C) (03/31 0915) ?Pulse Rate:  [79-91] 79 (03/31 0915) ?Resp:  [16-18] 18 (03/31 0915) ?BP: (117-185)/(42-58) 117/42 (03/31 0915) ?SpO2:  [98 %-100 %] 100 % (03/31 0915) ?Weight:  [52.7 kg-53.1 kg] 52.7 kg (03/31 0700) ? ?Weight change: 0 kg ?Filed Weights  ? 06/06/21 0850 06/06/21 2033 06/07/21 0700  ?Weight: 50.1 kg 53.1 kg 52.7 kg  ? ? ?Intake/Output: ?  ? ?Intake/Output Summary (Last 24 hours) at 06/07/2021 1418 ?Last data filed at 06/07/2021 1410 ?Gross per 24 hour  ?Intake 9360 ml  ?Output 8373 ml  ?Net 987 ml  ? ? ? ?Physical Exam: ?General:  No acute distress, laying in the bed  ?HEENT  anicteric, moist oral mucous membrane  ?Pulm/lungs  normal breathing effort, lungs are clear to auscultation  ?CVS/Heart  regular rhythm, no rub or gallop  ?Abdomen:   Soft, PD catheter in place  ?Extremities:  No peripheral edema  ?Neurologic:  Alert, oriented, able to follow commands  ?Skin:  No acute rashes  ? ? ? ?Basic Metabolic Panel: ? ?Recent Labs  ?Lab 06/04/21 ?1428 06/05/21 ?0129 06/07/21 ?1020  ?NA 137 132* 132*  ?K 4.4 4.0 4.2  ?CL 97* 96* 99  ?CO2 _0 ?GLUCOSE 268* 271* 197*  ?BUN 44* 48* 38*  ?CREATININE 6.90* 6.78* 6.71*  ?CALCIUM 9.3 8.7* 7.7*  ? ? ? ? ?CBC: ?Recent Labs  ?Lab 06/04/21 ?1428  06/05/21 ?0129 06/05/21 ?0332 06/05/21 ?1406 06/05/21 ?2155 06/06/21 ?5427 06/06/21 ?1746 06/07/21 ?0623 06/07/21 ?1020  ?WBC 11.1* 13.0*  --   --   --   --   --   --  9.7  ?HGB 7.0* 9.8*   < > 7.5* 7.5* 7.2* 8.6* 7.5* 9.2*  ?HCT 21.9* 28.9*  --  22.4*  --   --  25.6*  --  26.3*  ?MCV 98.6 88.9  --   --   --   --   --   --  85.4  ?PLT 405* 285  --   --   --   --   --   --  195  ? < > = values in this interval not displayed.  ? ? ? ? No results found for: HEPBSAG, HEPBSAB, HEPBIGM ? ? ? ?Microbiology: ? ?No results found for this or any previous visit (from the past 240 hour(s)). ? ?Coagulation Studies: ?Recent Labs  ?  06/04/21 ?1630  ?LABPROT 13.7  ?INR 1.1  ? ? ? ?Urinalysis: ?Recent Labs  ?  06/06/21 ?0725  ?COLORURINE YELLOW*  ?LABSPEC 1.009  ?PHURINE 6.0  ?GLUCOSEU >=500*  ?HGBUR SMALL*  ?BILIRUBINUR NEGATIVE  ?KETONESUR NEGATIVE  ?PROTEINUR 100*  ?NITRITE NEGATIVE  ?LEUKOCYTESUR LARGE*  ? ?  ? ? ?Imaging: ?PERIPHERAL VASCULAR CATHETERIZATION ? ?Result Date: 06/06/2021 ?See surgical note for result.  ? ? ?  Medications:  ? ? sodium chloride    ? dialysis solution 1.5% low-MG/low-CA    ? ? carvedilol  12.5 mg Oral BID WC  ? famotidine  20 mg Oral BH-q7a  ? gabapentin  300 mg Oral QHS  ? gentamicin cream  1 application. Topical Daily  ? insulin aspart  0-9 Units Subcutaneous TID WC  ? insulin glargine-yfgn  10 Units Subcutaneous QHS  ? ?acetaminophen, HYDROmorphone (DILAUDID) injection, methocarbamol, ondansetron (ZOFRAN) IV ? ?Assessment/ Plan:  ?81 y.o. female with  was admitted on 06/04/2021 for ? ?Principal Problem: ?  Acute blood loss anemia ?Active Problems: ?  Multiple myeloma ([facility]) ?  Hypertension ?  Type II diabetes mellitus with renal manifestations ([place]) ?  Acute lower GI bleeding ?  ESRD on peritoneal dialysis [facility]) ?  Chronic diastolic CHF (congestive heart failure) ([place]) ?  Pressure injury of skin ? ?Acute blood loss anemia [D62] ?Lower GI bleed [K92.2] ?Symptomatic anemia [D64.9] ? ?#.  ESRD ?[facility]/[place] dialysis/4 x 2000 cc ?Patient tolerated treatment well overnight.  We will continue PD with current prescribed treatment plan. ? ?#. Anemia of CKD ? ?Lab Results  ?Component Value Date  ? HGB 9.2 (L) 06/07/2021  ?Patient with rectal bleeding as seen on CT. ?We will arrange for ESA therapy ?Vascular surgery performed embolization yesterday.  No further reports of bright red blood with stool.  Patient continues to have dark stools.  Additional unit of blood given this morning ? ?#Secondary hyperparathyroidism ?Outpatient P[facility] 197 from May 10, 2021 Continue to hold Renvela ? ?#. Diabetes type 2 with CKD ?Hgb A1c MFr Bld (%)  ?Date Value  ?06/04/2021 6.6 (H)  ?Insulin-dependent ?Glucose stable ? ? LOS: 3 ?[place] ?3/31/20232:18 PM ? ?[facility] ?[place], [place] ?[phone] ? ?

## 2021-06-07 NOTE — Progress Notes (Signed)
PT Cancellation Note ? ?Patient Details ?Name: Misty [name] ?MRN: [id number] ?DOB: 12-23-1940 ? ? ?Cancelled Treatment:    Reason Eval/Treat Not Completed: Other (comment) ?Pending further medical work-up; PT will attempt at later date/time. ? ?Jonnie Kind, SPT ?06/07/2021, 10:30 AM ?

## 2021-06-07 NOTE — Progress Notes (Signed)
Cross Cover ?Unit of blood ordered for 1 gm drop in 6 hours.  BP heart rate stable ? ?

## 2021-06-07 NOTE — Care Management Important Message (Signed)
Important Message ? ?Patient Details  ?Name: Misty [name] ?MRN: [id number] ?Date of Birth: September 20, 1940 ? ? ?Medicare Important Message Given:  Yes ? ? ? ? ?Dannette Barbara ?06/07/2021, 11:24 AM ?

## 2021-06-07 NOTE — Progress Notes (Signed)
?PROGRESS NOTE ? ? ? ?Misty [name]  XTG:[id number] DOB: June 08, 1940 DOA: 06/04/2021 ?PCP: Misty Curry, MD  ? ? ?Brief Narrative:  ?Misty [name] is a 81 y.o. female with medical history significant of colon cancer, end-stage renal disease peritoneal dialysis, type 2 diabetes, essential hypertension, chronic diastolic congestive heart failure, history of TIA who present to the hospital with a rectal bleeding and generalized weakness. ?Patient normally walks with a walker, on a walk a few steps, she has frequent falls due to weakness.  She started have rectal bleeding since Sunday, it was fresh red blood mixed with clots.  She did not have any abdominal pain.  Her last bowel movement was Saturday which was normal.  At the same time, patient was also nauseous and vomited a few times, her emesis was not bloody or black. ?Patient also has some dizziness and increased weakness. ?  ?Upon arriving the hospital, patient temperature was 98.4, heart rate 90, respirate 16, blood pressure 112/51, oxygen saturation 97% on room air. ?Her hemoglobin was 7.0, platelets 405, creatinine 6.9, glucose 260. ?Emergency room physician has ordered a blood transfusion. ? ?3/29Still bleeding per rectum this am.  ?3/30 s/p embolization this am. Has no complaints this morning since just got back ?3/31 had bleed with clots early this am. Hg was 7.5, was given 1 unit early this morning of packed red blood cells ? ?Consultants:  ?GI, nephrology , nephrology ? ?Procedures:  ? ?Antimicrobials:  ? ? ?Subjective: ?Patient denies any shortness of breath, chest pain or abdominal pain ? ? ?Objective: ?Vitals:  ? 06/07/21 0524 06/07/21 0557 06/07/21 0700 06/07/21 0751  ?BP: (!) 127/47 (!) 145/50  (!) 159/46  ?Pulse: 91 90  86  ?Resp: '18 18  17  '$ ?Temp: 98.9 ?F (37.2 ?C) 98.8 ?F (37.1 ?C)  98.7 ?F (37.1 ?C)  ?TempSrc: Oral Oral  Oral  ?SpO2: 98% 100%  100%  ?Weight:   52.7 kg   ?Height:      ? ? ?Intake/Output Summary (Last 24 hours) at  06/07/2021 0826 ?Last data filed at 06/07/2021 0720 ?Gross per 24 hour  ?Intake 8590 ml  ?Output 8378 ml  ?Net 212 ml  ? ?Filed Weights  ? 06/06/21 0850 06/06/21 2033 06/07/21 0700  ?Weight: 50.1 kg 53.1 kg 52.7 kg  ? ? ?Examination: ?Calm, NAD ?Cta no w/r ?Reg s1/s2 no gallop ?Soft benign +bs ?No edema ?Aaoxox3  ?Mood and affect appropriate in current setting  ? ? ?Data Reviewed: I have personally reviewed following labs and imaging studies ? ?CBC: ?Recent Labs  ?Lab 06/04/21 ?1428 06/05/21 ?0129 06/05/21 ?0332 06/05/21 ?1406 06/05/21 ?2155 06/06/21 ?2703 06/06/21 ?1746 06/07/21 ?5009  ?WBC 11.1* 13.0*  --   --   --   --   --   --   ?HGB 7.0* 9.8*   < > 7.5* 7.5* 7.2* 8.6* 7.5*  ?HCT 21.9* 28.9*  --  22.4*  --   --  25.6*  --   ?MCV 98.6 88.9  --   --   --   --   --   --   ?PLT 405* 285  --   --   --   --   --   --   ? < > = values in this interval not displayed.  ? ?Basic Metabolic Panel: ?Recent Labs  ?Lab 06/04/21 ?1428 06/05/21 ?0129  ?NA 137 132*  ?K 4.4 4.0  ?CL 97* 96*  ?CO2 26 25  ?GLUCOSE 268* 271*  ?BUN 44* 48*  ?  CREATININE 6.90* 6.78*  ?CALCIUM 9.3 8.7*  ? ?GFR: ?Estimated Creatinine Clearance: 5.7 mL/min (A) (by C-G formula based on SCr of 6.78 mg/dL (H)). ?Liver Function Tests: ?Recent Labs  ?Lab 06/04/21 ?1630  ?AST 18  ?ALT 10  ?ALKPHOS 43  ?BILITOT 0.4  ?PROT 6.2*  ?ALBUMIN 2.8*  ? ?No results for input(s): LIPASE, AMYLASE in the last 168 hours. ?No results for input(s): AMMONIA in the last 168 hours. ?Coagulation Profile: ?Recent Labs  ?Lab 06/04/21 ?1630  ?INR 1.1  ? ?Cardiac Enzymes: ?No results for input(s): CKTOTAL, CKMB, CKMBINDEX, TROPONINI in the last 168 hours. ?BNP (last 3 results) ?No results for input(s): PROBNP in the last 8760 hours. ?HbA1C: ?Recent Labs  ?  06/04/21 ?1428  ?HGBA1C 6.6*  ? ?CBG: ?Recent Labs  ?Lab 06/06/21 ?1001 06/06/21 ?1149 06/06/21 ?1713 06/06/21 ?2121 06/07/21 ?4970  ?GLUCAP 165* 147* 126* 123* 181*  ? ?Lipid Profile: ?No results for input(s): CHOL, HDL, LDLCALC,  TRIG, CHOLHDL, LDLDIRECT in the last 72 hours. ?Thyroid Function Tests: ?No results for input(s): TSH, T4TOTAL, FREET4, T3FREE, THYROIDAB in the last 72 hours. ?Anemia Panel: ?No results for input(s): VITAMINB12, FOLATE, FERRITIN, TIBC, IRON, RETICCTPCT in the last 72 hours. ?Sepsis Labs: ?No results for input(s): PROCALCITON, LATICACIDVEN in the last 168 hours. ? ?No results found for this or any previous visit (from the past 240 hour(s)).  ? ? ? ? ? ?Radiology Studies: ?PERIPHERAL VASCULAR CATHETERIZATION ? ?Result Date: 06/06/2021 ?See surgical note for result. ? ?CT ANGIO GI BLEED ? ?Result Date: 06/05/2021 ?CLINICAL DATA:  Evaluate for lower GI bleed. History of rectal bleeding since Sunday. EXAM: CTA ABDOMEN AND PELVIS WITHOUT AND WITH CONTRAST TECHNIQUE: Multidetector CT imaging of the abdomen and pelvis was performed using the standard protocol during bolus administration of intravenous contrast. Multiplanar reconstructed images and MIPs were obtained and reviewed to evaluate the vascular anatomy. RADIATION DOSE REDUCTION: This exam was performed according to the departmental dose-optimization program which includes automated exposure control, adjustment of the mA and/or kV according to patient size and/or use of iterative reconstruction technique. CONTRAST:  82m OMNIPAQUE IOHEXOL 350 MG/ML SOLN COMPARISON:  None. FINDINGS: VASCULAR Aorta: Extensive abdominal aortic atherosclerotic calcifications. There is a nonocclusive calcification within the lumen of the infrarenal abdominal aorta measuring 2 x 1.2 cm, image 56/4. Normal caliber aorta without aneurysm, dissection, or vasculitis. Celiac: Patent without evidence of aneurysm, dissection, vasculitis or significant stenosis. SMA: Calcified plaque at the origin of the SMA results in approximately 50% stenosis. Renals: Right renal artery is patent without significant stenosis. Calcified plaque at the origin of the left renal artery results in approximately 50%  stenosis. 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 Inflow: Patent without evidence of aneurysm, dissection, vasculitis or significant stenosis. Proximal Outflow: Bilateral common femoral and visualized portions of the superficial and profunda femoral arteries are patent without evidence of aneurysm, dissection, vasculitis or significant stenosis. Veins: No obvious venous abnormality within the limitations of this arterial phase study. Review of the MIP images confirms the above findings. NON-VASCULAR Lower chest: No acute abnormality. Hepatobiliary: No focal liver abnormality. Stone within the gallbladder measures 2.3 cm. No signs of gallbladder wall inflammation. No bile duct dilatation. Pancreas: Unremarkable. No pancreatic ductal dilatation or surrounding inflammatory changes. Spleen: Normal in size without focal abnormality. Adrenals/Urinary Tract: Normal adrenal glands. Bilateral renal atrophy. No nephrolithiasis, hydronephrosis or mass identified. Urinary bladder is unremarkable. Stomach/Bowel: Stomach appears nondistended. There is no bowel wall thickening, inflammation, or distension. Anastomotic suture  line is identified at the level of the rectum. Distal to the anastomotic suture line there is active extravasation of contrast material on the arterial phase images, image 205/5. Further accumulation of intraluminal contrast material is identified on the delayed portal venous images, image 198/4. Lymphatic: No signs of abdominopelvic adenopathy. Reproductive: Uterus and bilateral adnexa are unremarkable. Other: There is a fat density mass within the central aspect of the abdomen which measures 5.1 x 3.9 cm and likely represents an area of remote fat necrosis, image 96/4. Fluid attenuating structure in the right side of pelvis is indeterminate measuring 5.2 x 3.0 cm, image 163/4. Musculoskeletal: The bones appear diffusely osteopenic. Multilevel thoracolumbar degenerative disc  disease. IMPRESSION: 1. There is active intraluminal extravasation of contrast material within the rectum just distal to anastomotic suture line. This is compatible with active GI bleeding within the rectum. 2. Ext

## 2021-06-07 NOTE — Evaluation (Signed)
Physical Therapy Evaluation ?Patient Details ?Name: Misty [name] ?MRN: [id number] ?DOB: 10-15-1940 ?Today's Date: 06/07/2021 ? ?History of Present Illness ? Pt is a 81 y.o. female who presents to the ED w/ complaints of feeling weak and lightheaded. She reports bright red blood coming out of rectum. Admitted for acute blood loss anemia; Pt baseline Hb was 9-10, dropped down to 7 on (3/28). PmHx: Aortic Valve Stenosis, ESRD, GERD, HLD, Hyperparathyroidism, HTN, OA, T2DM, TIA. ?  ?Clinical Impression ? Pt awake and alert resting in bed upon PT entrance into room w/ husband at bedside. Pt is A&Ox4 and denies any c/o pain at rest. She states prior to hospitalization, her and her husband live in a 1-story house; requires assistance PRN assistance for ADLs and uses a rollator at baseline. Husband endorses that she has had a couple half dozen falls over the past few months.  ? ?Pt is able to perform bed mobility w/ minA; provided for trunk elevation. Once seated EOB she is able to maintain static sitting balance and able to complete x10 long arc quads on each leg prior to standing. She performs x3 sit to stands using RW w/ noticeable knee buckling/jerking. On 3rd attempt she is able to progress to ambulating around bed (~24f) w/ CGA using RW to sit in recliner. Noticeable knee buckling still occurring throughout gait, but pt able to self-recover. Pt resting in recliner w/ all needs w/in reach prior to PT exiting room. Pt will benefit from continued skilled PT in order to increase LE strength/endurance, improve mobility/gait/balance, and restore PLOF. Current discharge recommendation to HHPT is appropriate due to the level of assistance required by the patient to ensure safety and improve overall function. ? ?   ? ?Recommendations for follow up therapy are one component of a multi-disciplinary discharge planning process, led by the attending physician.  Recommendations may be updated based on patient status, additional  functional criteria and insurance authorization. ? ?Follow Up Recommendations Home health PT ? ?  ?Assistance Recommended at Discharge Frequent or constant Supervision/Assistance  ?Patient can return home with the following ? A little help with walking and/or transfers;A little help with bathing/dressing/bathroom;Assistance with cooking/housework;Assist for transportation;Help with stairs or ramp for entrance ? ?  ?Equipment Recommendations Rolling walker (2 wheels)  ?Recommendations for Other Services ?    ?  ?Functional Status Assessment    ? ?  ?Precautions / Restrictions Precautions ?Precautions: Fall ?Restrictions ?Weight Bearing Restrictions: No  ? ?  ? ?Mobility ? Bed Mobility ?Overal bed mobility: Needs Assistance ?Bed Mobility: Supine to Sit ?  ?  ?Supine to sit: Min assist ?  ?  ?General bed mobility comments: minA for trunk elevation ?  ? ?Transfers ?Overall transfer level: Needs assistance ?Equipment used: Rolling walker (2 wheels) ?Transfers: Sit to/from Stand, Bed to chair/wheelchair/BSC ?Sit to Stand: Min guard ?  ?Step pivot transfers: Min guard ?  ?  ?  ?General transfer comment: able to walk around bed to transfer to recliner. ?  ? ?Ambulation/Gait ?Ambulation/Gait assistance: Min guard ?Gait Distance (Feet): 10 Feet ?Assistive device: Rolling walker (2 wheels) ?Gait Pattern/deviations: Decreased step length - right, Decreased step length - left, Step-through pattern, Decreased stride length ?Gait velocity: decreased ?  ?  ?General Gait Details: notable knee buckling with upright, but recovers on own ? ?Stairs ?  ?  ?  ?  ?  ? ?Wheelchair Mobility ?  ? ?Modified Rankin (Stroke Patients Only) ?  ? ?  ? ?Balance Overall balance assessment: Needs  assistance ?Sitting-balance support: Bilateral upper extremity supported, Feet supported ?Sitting balance-Leahy Scale: Fair ?  ?  ?Standing balance support: Bilateral upper extremity supported, During functional activity, Reliant on assistive device for  balance ?Standing balance-Leahy Scale: Fair ?  ?  ?  ?  ?  ?  ?  ?  ?  ?  ?  ?  ?   ? ? ? ?Pertinent Vitals/Pain Pain Assessment ?Pain Assessment: No/denies pain  ? ? ?Home Living   ?  ?  ?  ?  ?  ?  ?  ?  ?  ?   ?  ?Prior Function   ?  ?  ?  ?  ?  ?  ?  ?  ?  ? ? ?Hand Dominance  ?   ? ?  ?Extremity/Trunk Assessment  ?   ?  ? ?  ?  ? ?   ?Communication  ?    ?Cognition Arousal/Alertness: Awake/alert ?Behavior During Therapy: [facility] for tasks assessed/performed ?Overall Cognitive Status: Within Functional Limits for tasks assessed ?  ?  ?  ?  ?  ?  ?  ?  ?  ?  ?  ?  ?  ?  ?  ?  ?  ?  ?  ? ?  ?General Comments   ? ?  ?Exercises    ? ?Assessment/Plan  ?  ?PT Assessment    ?PT Problem List   ? ?   ?  ?PT Treatment Interventions     ? ?PT Goals (Current goals can be found in the Care Plan section)  ?Acute Rehab PT Goals ?Patient Stated Goal: to improve strength and go home ?PT Goal Formulation: With patient/family ?Time For Goal Achievement: 06/21/21 ?Potential to Achieve Goals: Fair ? ?  ?Frequency Min 2X/week ?  ? ? ?Co-evaluation   ?  ?  ?  ?  ? ? ?  ?AM-PAC PT "6 Clicks" Mobility  ?Outcome Measure Help needed turning from your back to your side while in a flat bed without using bedrails?: A Little ?Help needed moving from lying on your back to sitting on the side of a flat bed without using bedrails?: A Little ?Help needed moving to and from a bed to a chair (including a wheelchair)?: A Little ?Help needed standing up from a chair using your arms (e.g., wheelchair or bedside chair)?: A Little ?Help needed to walk in hospital room?: A Lot ?Help needed climbing 3-5 steps with a railing? : A Lot ?6 Click Score: 16 ? ?  ?End of Session Equipment Utilized During Treatment: Gait belt ?Activity Tolerance: Patient limited by fatigue ?Patient left: with chair alarm set;in chair;with call bell/phone within reach;with family/visitor present ?Nurse Communication: Mobility status ?PT Visit Diagnosis: Unsteadiness on feet  (R26.81);History of falling (Z91.81);Muscle weakness (generalized) (M62.81);Other abnormalities of gait and mobility (R26.89) ?  ? ?Time: 9983-3825 ?PT Time Calculation (min) (ACUTE ONLY): 29 min ? ? ?Charges:     ?  ?  ?   ? ?Jonnie Kind, SPT ?06/07/2021, 2:54 PM ? ?

## 2021-06-07 NOTE — Progress Notes (Signed)
Patient disconnected from peritoneal dialysis catheter without complications.Total UF= 370m. Effluent clear with no noted fibrin. Tolerated well per patient/patient daughter. PD catheter capped and clamped. ?

## 2021-06-07 NOTE — Progress Notes (Signed)
? ?[name] , MD ?[address], [place], [place], [place], [address] ?[address] [place], [place], [place], [address] ?Phone: [phone]  ?Fax: [phone] ? ? ?Misty [name] is being followed for rectal bleeding  Day 2 of follow up  ? ?Subjective: ?No rectal bleeding- doing well  ? ? ?Objective: ?Vital signs in last 24 hours: ?Vitals:  ? 06/07/21 0557 06/07/21 0700 06/07/21 0751 06/07/21 0915  ?BP: (!) 145/50  (!) 159/46 (!) 117/42  ?Pulse: 90  86 79  ?Resp: $Remov'18  17 18  'pOstRa$ ?Temp: 98.8 ?F (37.1 ?C)  98.7 ?F (37.1 ?C) 98.7 ?F (37.1 ?C)  ?TempSrc: Oral  Oral   ?SpO2: 100%  100% 100%  ?Weight:  52.7 kg    ?Height:      ? ?Weight change: 0 kg ? ?Intake/Output Summary (Last 24 hours) at 06/07/2021 1040 ?Last data filed at 06/07/2021 1012 ?Gross per 24 hour  ?Intake 9180 ml  ?Output 8373 ml  ?Net 807 ml  ? ? ? ?Exam: ? ?Abdomen: soft, nontender, normal bowel sounds ? ? ?Lab Results: ?$RemoveBefore'@LABTEST2'ajBNbUZxNpwAP$ @ ?Micro Results: ?No results found for this or any previous visit (from the past 240 hour(s)). ?Studies/Results: ?PERIPHERAL VASCULAR CATHETERIZATION ? ?Result Date: 06/06/2021 ?See surgical note for result. ? ?CT ANGIO GI BLEED ? ?Result Date: 06/05/2021 ?CLINICAL DATA:  Evaluate for lower GI bleed. History of rectal bleeding since Sunday. EXAM: CTA ABDOMEN AND PELVIS WITHOUT AND WITH CONTRAST TECHNIQUE: Multidetector CT imaging of the abdomen and pelvis was performed using the standard protocol during bolus administration of intravenous contrast. Multiplanar reconstructed images and MIPs were obtained and reviewed to evaluate the vascular anatomy. RADIATION DOSE REDUCTION: This exam was performed according to the departmental dose-optimization program which includes automated exposure control, adjustment of the mA and/or kV according to patient size and/or use of iterative reconstruction technique. CONTRAST:  41mL OMNIPAQUE IOHEXOL 350 MG/ML SOLN COMPARISON:  None. FINDINGS: VASCULAR Aorta: Extensive abdominal aortic  atherosclerotic calcifications. There is a nonocclusive calcification within the lumen of the infrarenal abdominal aorta measuring 2 x 1.2 cm, image 56/4. Normal caliber aorta without aneurysm, dissection, or vasculitis. Celiac: Patent without evidence of aneurysm, dissection, vasculitis or significant stenosis. SMA: Calcified plaque at the origin of the SMA results in approximately 50% stenosis. Renals: Right renal artery is patent without significant stenosis. Calcified plaque at the origin of the left renal artery results in approximately 50% stenosis. 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 Inflow: Patent without evidence of aneurysm, dissection, vasculitis or significant stenosis. Proximal Outflow: Bilateral common femoral and visualized portions of the superficial and profunda femoral arteries are patent without evidence of aneurysm, dissection, vasculitis or significant stenosis. Veins: No obvious venous abnormality within the limitations of this arterial phase study. Review of the MIP images confirms the above findings. NON-VASCULAR Lower chest: No acute abnormality. Hepatobiliary: No focal liver abnormality. Stone within the gallbladder measures 2.3 cm. No signs of gallbladder wall inflammation. No bile duct dilatation. Pancreas: Unremarkable. No pancreatic ductal dilatation or surrounding inflammatory changes. Spleen: Normal in size without focal abnormality. Adrenals/Urinary Tract: Normal adrenal glands. Bilateral renal atrophy. No nephrolithiasis, hydronephrosis or mass identified. Urinary bladder is unremarkable. Stomach/Bowel: Stomach appears nondistended. There is no bowel wall thickening, inflammation, or distension. Anastomotic suture line is identified at the level of the rectum. Distal to the anastomotic suture line there is active extravasation of contrast material on the arterial phase images, image 205/5. Further accumulation of intraluminal contrast material is  identified on  the delayed portal venous images, image 198/4. Lymphatic: No signs of abdominopelvic adenopathy. Reproductive: Uterus and bilateral adnexa are unremarkable. Other: There is a fat density mass within the central aspect of the abdomen which measures 5.1 x 3.9 cm and likely represents an area of remote fat necrosis, image 96/4. Fluid attenuating structure in the right side of pelvis is indeterminate measuring 5.2 x 3.0 cm, image 163/4. Musculoskeletal: The bones appear diffusely osteopenic. Multilevel thoracolumbar degenerative disc disease. IMPRESSION: 1. There is active intraluminal extravasation of contrast material within the rectum just distal to anastomotic suture line. This is compatible with active GI bleeding within the rectum. 2. Extensive aortic atherosclerosis. There is approximately 50% stenosis at the origin of the SMA and left renal artery. Calcified intraluminal filling defect within the infra renal abdominal aorta may reflect calcification of previously ruptured atherosclerotic plaque. 3. 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 4. Cholelithiasis without evidence of acute cholecystitis. 5. Fluid attenuating structure in the right side of the pelvis is indeterminate. This is favored to represent a benign abnormality in may be related to prior surgeries. 6. Aortic Atherosclerosis (ICD10-I70.0). Critical Value/emergent results were called by telephone at the time of interpretation on 06/05/2021 at 12:08 pm to provider [facility] , who verbally acknowledged these results. Electronically Signed   By: Kerby Moors M.D.   On: 06/05/2021 12:09   ?Medications: I have reviewed the patient's current medications. ?Scheduled Meds: ? carvedilol  12.5 mg Oral BID WC  ? famotidine  20 mg Oral BH-q7a  ? gabapentin  300 mg Oral QHS  ? gentamicin cream  1 application. Topical Daily  ? insulin aspart  0-9 Units Subcutaneous TID WC  ? insulin glargine-yfgn  10 Units  Subcutaneous QHS  ? ?Continuous Infusions: ? sodium chloride    ? dialysis solution 1.5% low-MG/low-CA    ? ?PRN Meds:.acetaminophen, HYDROmorphone (DILAUDID) injection, methocarbamol, ondansetron (ZOFRAN) IV ? ? ?Assessment: ?Principal Problem: ?  Acute blood loss anemia ?Active Problems: ?  Multiple myeloma ([place]) ?  Hypertension ?  Type II diabetes mellitus with renal manifestations ([place]) ?  Acute lower GI bleeding ?  ESRD on peritoneal dialysis [facility]) ?  Chronic diastolic CHF (congestive heart failure) ([place]) ?  Pressure injury of skin ? ?Neira Bentsen is a 81 y.o. y/o female with a prior history of rectal cancer s/p surgery and reversal of her ileostomy with normal colonoscopy in 2019 after reversal at [place].  No subsequent colonoscopy presents the emergency room with a day history of rectal bleeding and a drop in hemoglobin of over 4 g from baseline.  Anemia is normocytic.  She has end-stage renal disease and also has a history of myeloma.  Presently has ongoing rectal bleeding which has not resolved.  Differentials include a diverticular bleed versus bleeding from AVMs as she has history of chronic kidney disease and there is also possibility of recurrence of cancer.  CT angiogram yesterday was positive for a bleed in the proximity of the anastomosis.  Status post embolization by vascular surgery. ?Hb 75.2---->8.6--->7.5.No further bleeding . Feels hungry wants to eat . As an outpatient will perform flexible sigmoidoscopy as an outpatient.  ? ? ? LOS: 3 days  ? ?[name], MD ?06/07/2021, 10:40 AM  ?

## 2021-06-08 LAB — TYPE AND SCREEN
ABO/RH(D): A NEG
Antibody Screen: NEGATIVE
Unit division: 0
Unit division: 0
Unit division: 0
Unit division: 0

## 2021-06-08 LAB — CBC
HCT: 24.2 % — ABNORMAL LOW (ref 36.0–46.0)
Hemoglobin: 8.3 g/dL — ABNORMAL LOW (ref 12.0–15.0)
MCH: 29.6 pg (ref 26.0–34.0)
MCHC: 34.3 g/dL (ref 30.0–36.0)
MCV: 86.4 fL (ref 80.0–100.0)
Platelets: 190 10*3/uL (ref 150–400)
RBC: 2.8 MIL/uL — ABNORMAL LOW (ref 3.87–5.11)
RDW: 16.1 % — ABNORMAL HIGH (ref 11.5–15.5)
WBC: 8 10*3/uL (ref 4.0–10.5)
nRBC: 0.3 % — ABNORMAL HIGH (ref 0.0–0.2)

## 2021-06-08 LAB — BPAM RBC
Blood Product Expiration Date: 202304012359
Blood Product Expiration Date: 202304112359
Blood Product Expiration Date: 202304162359
Blood Product Expiration Date: 202304162359
ISSUE DATE / TIME: 202303281720
ISSUE DATE / TIME: 202303282236
ISSUE DATE / TIME: 202303301324
ISSUE DATE / TIME: 202303310528
Unit Type and Rh: 600
Unit Type and Rh: 600
Unit Type and Rh: 600
Unit Type and Rh: 600

## 2021-06-08 LAB — GLUCOSE, CAPILLARY: Glucose-Capillary: 196 mg/dL — ABNORMAL HIGH (ref 70–99)

## 2021-06-08 MED ORDER — PANTOPRAZOLE SODIUM 40 MG PO TBEC: 40.0000 mg | DELAYED_RELEASE_TABLET | Freq: Every day | ORAL | 0 refills | Status: DC

## 2021-06-08 NOTE — Discharge Summary (Signed)
Misty [name] EQA:[id number] DOB: [birth year] DOA: 06/04/2021 ? ?PCP: Gayland Curry, MD ? ?Admit date: 06/04/2021 ?Discharge date: 06/09/2021 ? ?Admitted From: Home  ?Disposition: Home ? ?Recommendations for Outpatient Follow-up:  ?Follow up with PCP in 1 week ?Please obtain BMP/CBC in one week ?Please follow up with vascular surgery in 1 to 2 weeks ?Follow-up with GI in 1 month ? ? ? ? ?Discharge Condition:Stable ?CODE STATUS: Full ?Diet recommendation: Heart Healthy ?Brief/Interim Summary: ?Per LNL:GXQJJHER Misty [name] is a 81 y.o. female with medical history significant of colon cancer, end-stage renal disease peritoneal dialysis, type 2 diabetes, essential hypertension, chronic diastolic congestive heart failure, history of TIA who present to the hospital with a rectal bleeding and generalized weakness. ?Patient normally walks with a walker, on a walk a few steps, she has frequent falls due to weakness.  She started have rectal bleeding since Sunday, it was fresh red blood mixed with clots.  She did not have any abdominal pain.  Her last bowel movement was Saturday which was normal.  At the same time, patient was also nauseous and vomited a few times, her emesis was not bloody or black.Upon arriving the hospital, patient temperature was 98.4, heart rate 90, respirate 16, blood pressure 112/51, oxygen saturation 97% on room air.Her hemoglobin was 7.0, platelets 405, creatinine 6.9, glucose 260. ? ? ?Acute blood loss anemia ?Due to GI bleed  ?CT angiogram  was positive for a bleed in the proximity of the anastomosis.  Status post embolization by vascular surgery-3/30 ?No colonoscopy needed per GI ?Received 3 units PRBC transfusion ?As an outpatient GI will perform flexible sigmoidoscopy as an outpatient.  ? ? ? ?  ?  ?Acute lower GI bleeding ?GI and vascular surgery were consulted  ?Status post embolization this a.m.  ?No need for colonoscopy  ?3/31 status post 1 unit packed red blood cell today, repeat H&H  posttransfusion responded well  ?CT angio positive for bleed in the proximity of the anastomosis  ?Bleeding stopped  ?status post embolization as above  ?Total of 3 transfusions PRBC  ?Monitor H&H  ?We will need to follow-up GI as outpatient in 1 month and will perform flexible sigmoidoscopy then ?  ?  ?  ?  ?Chronic diastolic CHF (congestive heart failure) ([place]) ?Recent echocardiogram was performed on 06/2020, ejection fraction 50 to 55%.  ?Was euvolemic ?Was receiving her peritoneal dialysis ?  ?  ?  ?  ?  ?  ?ESRD on peritoneal dialysis [facility]) ?Nephrology was consulted ?On peritoneal dialysis  ?  ?  ?  ?  ?  ?Type II diabetes mellitus with renal manifestations ([place]) ?Stable continue home meds ? ? ?  ?Hypertension ?Was holding some of her blood pressure meds initially due to GI bleed ?  ?  ?Multiple myeloma ([place]) ?Followed by pcp as outpatient ?  ?  ? ? ?Discharge Diagnoses:  ?Principal Problem: ?  Acute blood loss anemia ?Active Problems: ?  Multiple myeloma ([place]) ?  Hypertension ?  Type II diabetes mellitus with renal manifestations ([place]) ?  Acute lower GI bleeding ?  ESRD on peritoneal dialysis [facility]) ?  Chronic diastolic CHF (congestive heart failure) ([place]) ?  Pressure injury of skin ? ? ? ?Discharge Instructions ? ?Discharge Instructions   ? ? Diet - low sodium heart healthy   Complete by: As directed ?  ? Renal diet  ? Discharge instructions   Complete by: As directed ?  ? Do PD at home tonight instructed by nephrology  ?  Discharge wound care:   Complete by: As directed ?  ? As above  ? Increase activity slowly   Complete by: As directed ?  ? ?  ? ?Allergies as of 06/08/2021   ? ?   Reactions  ? Succinylcholine Other (See Comments)  ? Severe myalgias  ? Penicillins Rash  ? Tolerated cefazolin prior  ? ?  ? ?  ?Medication List  ?  ? ?STOP taking these medications   ? ?famotidine 20 MG tablet ?Commonly known as: PEPCID ?  ?furosemide 80 MG tablet ?Commonly known as: LASIX ?  ?hydrALAZINE 50 MG tablet ?Commonly  known as: APRESOLINE ?  ?NIFEdipine 90 MG 24 hr tablet ?Commonly known as: PROCARDIA XL/NIFEDICAL-XL ?  ?senna 8.6 MG Tabs tablet ?Commonly known as: SENOKOT ?  ? ?  ? ?TAKE these medications   ? ?acetaminophen 325 MG tablet ?Commonly known as: TYLENOL ?Take 650 mg by mouth every 4 (four) hours as needed for moderate pain. ?  ?carvedilol 12.5 MG tablet ?Commonly known as: COREG ?Take 12.5 mg by mouth 2 (two) times daily with a meal. ?  ?cetirizine 10 MG tablet ?Commonly known as: ZYRTEC ?Take 10 mg by mouth every morning. ?  ?colestipol 1 g tablet ?Commonly known as: COLESTID ?Take 2 g by mouth daily. ?  ?Fiber Diet Tabs ?Take 2 tablets by mouth 2 (two) times daily. ?  ?gabapentin 300 MG capsule ?Commonly known as: NEURONTIN ?Take 1 capsule (300 mg total) by mouth at bedtime. ?What changed: when to take this ?  ?glucose blood test strip ?1 each by Finger Stick route 4 times daily (after meals and nightly). Use as directed ?  ?HumaLOG 100 UNIT/ML injection ?Generic drug: insulin lispro ?Inject 4-12 Units into the skin 2 (two) times daily with a meal. ?  ?hydrocortisone 2.5 % rectal cream ?Commonly known as: ANUSOL-HC ?Apply 1 application. topically 3 (three) times daily. ?  ?INSULIN SYRINGE 1CC/29G 29G X 1/2" 1 ML Misc ?  ?lidocaine 5 % ?Commonly known as: LIDODERM ?Place 1 patch onto the skin daily as needed (pain). ?  ?methocarbamol 500 MG tablet ?Commonly known as: Robaxin ?Take 1 tablet (500 mg total) by mouth every 8 (eight) hours as needed for muscle spasms. ?  ?multivitamin Tabs tablet ?Take 1 tablet by mouth every morning. ?  ?pantoprazole 40 MG tablet ?Commonly known as: Protonix ?Take 1 tablet (40 mg total) by mouth daily. ?  ?sevelamer carbonate 800 MG tablet ?Commonly known as: RENVELA ?Take 1,600 mg by mouth 2 (two) times daily with a meal. ?  ?Sure Comfort Pen Needles 31G X 8 MM Misc ?Generic drug: Insulin Pen Needle ?  ?Vitamin D 50 MCG (2000 UT) tablet ?Take 2,000 Units by mouth every Monday,  Wednesday, and Friday. ?  ? ?  ? ?  ?  ? ? ?  ?Discharge Care Instructions  ?(From admission, onward)  ?  ? ? ?  ? ?  Start     Ordered  ? 06/08/21 0000  Discharge wound care:       ?Comments: As above  ? 06/08/21 0933  ? ?  ?  ? ?  ? ? Follow-up Information   ? ? Algernon Huxley, MD Follow up in 2 week(s).   ?Specialties: Vascular Surgery, Radiology, Interventional Cardiology ?Contact information: ?[address] ?[place] [address] ?[phone] ? ? ?  ?  ? ? [name], MD Follow up in 2 week(s).   ?Specialty: Gastroenterology ?Contact information: ?[address] ?[place] [address] ?[phone] ? ? ?  ?  ? ? Gayland Curry, MD Follow up in 1 week(s).   ?Specialty: Family Medicine ?Contact information: ?[address] ?[place] [address] ?[phone] ? ? ?  ?  ? ?  ?  ? ?  ? ?Allergies  ?Allergen Reactions  ? Succinylcholine Other (See Comments)  ?  Severe myalgias  ? Penicillins Rash  ?  Tolerated cefazolin prior  ? ? ?Consultations: ?Vascular surgery and GI nephrology ? ? ?Procedures/Studies: ?CT Head Wo Contrast ? ?Result Date: 06/04/2021 ?CLINICAL DATA:  Head injury. EXAM: CT HEAD WITHOUT CONTRAST CT CERVICAL SPINE WITHOUT CONTRAST TECHNIQUE: Multidetector CT imaging of the head and cervical spine was performed following the standard protocol without intravenous contrast. Multiplanar CT image reconstructions of the cervical spine were also generated. RADIATION DOSE REDUCTION: This exam was performed according to the departmental dose-optimization program which includes automated exposure control, adjustment of the mA and/or kV according to patient size and/or use of iterative reconstruction technique. COMPARISON:  None. FINDINGS: CT HEAD FINDINGS Brain: No evidence of acute infarction, hemorrhage, hydrocephalus, extra-axial collection or mass lesion/mass effect. Vascular: No hyperdense vessel or unexpected calcification. Skull: Normal. Negative for fracture or focal lesion.  Sinuses/Orbits: No acute finding. Other: None. CT CERVICAL SPINE FINDINGS Alignment: Normal. Skull base and vertebrae: No acute fracture. No primary bone lesion or focal pathologic process. Soft tissues and spinal canal: No preve

## 2021-06-08 NOTE — Progress Notes (Signed)
Pt educated on AVS with spouse at [facility]. Right groin dressing , DDI.  Educated pt to leave on for one more day and then cover with bandaid.  Pt wheelchaired out with spouse. ?

## 2021-06-08 NOTE — Progress Notes (Signed)
[facility] ?[place], [place] ?06/08/21 ? ?Subjective:  ? ?LOS: 4 ?Patient known to our practice from outpatient dialysis.  She is followed for peritoneal dialysis. ?She was seen in the emergency room.  Husband at bedside.  She presented for rectal bleeding and weakness.  She states rectal bleeding started Sunday and has been ongoing off and on. ? ?Update ?Patient currently resting quietly ?Sister at bedside ?Denies bloody stools ?Appetite improving ?Denies pain ?Patient seen later in morning with husband at bedside ?States she feels much better and stronger ?  ? ?Objective:  ?Vital signs in last 24 hours:  ?Temp:  [98.1 ?F (36.7 ?C)-98.9 ?F (37.2 ?C)] 98.1 ?F (36.7 ?C) (04/01 0316) ?Pulse Rate:  [71-80] 80 (04/01 0824) ?Resp:  [16-18] 16 (04/01 0316) ?BP: (118-159)/(33-48) 125/48 (04/01 3500) ?SpO2:  [100 %] 100 % (04/01 0316) ?Weight:  [50.2 kg-52 kg] 50.2 kg (04/01 0316) ? ?Weight change: 1.9 kg ?Filed Weights  ? 06/07/21 0700 06/07/21 1621 06/08/21 0316  ?Weight: 52.7 kg 52 kg 50.2 kg  ? ? ?Intake/Output: ?  ? ?Intake/Output Summary (Last 24 hours) at 06/08/2021 1001 ?Last data filed at 06/07/2021 1847 ?Gross per 24 hour  ?Intake 600 ml  ?Output --  ?Net 600 ml  ? ? ? ?Physical Exam: ?General:  No acute distress, laying in the bed  ?HEENT  anicteric, moist oral mucous membrane  ?Pulm/lungs  normal breathing effort, lungs are clear to auscultation  ?CVS/Heart  regular rhythm, no rub or gallop  ?Abdomen:   Soft, PD catheter in place  ?Extremities:  No peripheral edema  ?Neurologic:  Alert, oriented, able to follow commands  ?Skin:  No acute rashes  ? ? ? ?Basic Metabolic Panel: ? ?Recent Labs  ?Lab 06/04/21 ?1428 06/05/21 ?0129 06/07/21 ?1020  ?NA 137 132* 132*  ?K 4.4 4.0 4.2  ?CL 97* 96* 99  ?CO2 $Rem'26 25 22  'NmXW$ ?GLUCOSE 268* 271* 197*  ?BUN 44* 48* 38*  ?CREATININE 6.90* 6.78* 6.71*  ?CALCIUM 9.3 8.7* 7.7*  ? ? ? ? ?CBC: ?Recent Labs  ?Lab 06/04/21 ?1428 06/05/21 ?0129 06/05/21 ?0332 06/05/21 ?1406  06/05/21 ?2155 06/06/21 ?1746 06/07/21 ?9381 06/07/21 ?1020 06/07/21 ?1424 06/07/21 ?2226 06/08/21 ?8299  ?WBC 11.1* 13.0*  --   --   --   --   --  9.7  --   --  8.0  ?HGB 7.0* 9.8*   < > 7.5*   < > 8.6* 7.5* 9.2* 9.9* 8.2* 8.3*  ?HCT 21.9* 28.9*  --  22.4*  --  25.6*  --  26.3*  --   --  24.2*  ?MCV 98.6 88.9  --   --   --   --   --  85.4  --   --  86.4  ?PLT 405* 285  --   --   --   --   --  195  --   --  190  ? < > = values in this interval not displayed.  ? ? ? ? No results found for: HEPBSAG, HEPBSAB, HEPBIGM ? ? ? ?Microbiology: ? ?No results found for this or any previous visit (from the past 240 hour(s)). ? ?Coagulation Studies: ?No results for input(s): LABPROT, INR in the last 72 hours. ? ? ?Urinalysis: ?Recent Labs  ?  06/06/21 ?0725  ?COLORURINE YELLOW*  ?LABSPEC 1.009  ?PHURINE 6.0  ?GLUCOSEU >=500*  ?HGBUR SMALL*  ?BILIRUBINUR NEGATIVE  ?KETONESUR NEGATIVE  ?PROTEINUR 100*  ?NITRITE NEGATIVE  ?LEUKOCYTESUR LARGE*  ? ?  ? ? ?  Imaging: ?No results found. ? ? ?Medications:  ? ? sodium chloride    ? dialysis solution 1.5% low-MG/low-CA    ? ? carvedilol  12.5 mg Oral BID WC  ? famotidine  20 mg Oral BH-q7a  ? gabapentin  300 mg Oral QHS  ? gentamicin cream  1 application. Topical Daily  ? insulin aspart  0-9 Units Subcutaneous TID WC  ? insulin glargine-yfgn  10 Units Subcutaneous QHS  ? ?acetaminophen, HYDROmorphone (DILAUDID) injection, methocarbamol, ondansetron (ZOFRAN) IV ? ?Assessment/ Plan:  ?81 y.o. female with  was admitted on 06/04/2021 for ? ?Principal Problem: ?  Acute blood loss anemia ?Active Problems: ?  Multiple myeloma ([place]) ?  Hypertension ?  Type II diabetes mellitus with renal manifestations ([place]) ?  Acute lower GI bleeding ?  ESRD on peritoneal dialysis [facility]) ?  Chronic diastolic CHF (congestive heart failure) ([place]) ?  Pressure injury of skin ? ?Acute blood loss anemia [D62] ?Lower GI bleed [K92.2] ?Symptomatic anemia [D64.9] ? ?#. ESRD ?CCPD/[place] dialysis/4 x 2000 cc ?Patient  tolerated treatment well overnight.  UF 551ml achieved. Will continue nightly treatments while patient is inpatient.  ? ?#. Anemia of CKD ? ?Lab Results  ?Component Value Date  ? HGB 8.3 (L) 06/08/2021  ?Patient with rectal bleeding as seen on CT. ?We will arrange for ESA therapy ?Vascular surgery performed embolization on 06/06/21  Continues to deny further rectal bleeding.  ? ?#Secondary hyperparathyroidism ?Outpatient PTH 197 from May 10, 2021 Continue to hold [place]a ? ?#. Diabetes type 2 with CKD ?Hgb A1c MFr Bld (%)  ?Date Value  ?06/04/2021 6.6 (H)  ?Insulin-dependent ?Glucose elevated, SSI managed by primary team ? ? LOS: 4 ?[place] ?4/1/202310:01 AM ? ?[facility] ?[place], [place] ?[phone] ? ?

## 2021-06-08 NOTE — TOC Transition Note (Signed)
Transition of Care (TOC) - CM/SW Discharge Note ? ? ?Patient Details  ?Name: Misty [name] ?MRN: [id number] ?Date of Birth: 02/27/1941 ? ?Transition of Care (TOC) CM/SW Contact:  ?Harriet Masson, RN ?Phone [phone] ?06/08/2021, 9:10 AM ? ? ?Clinical Narrative:    ?RN spoke with pt's spouse who was at bedside and educated on heart failure management and what to do if acute swelling occurs along with HF zones and what to do if acute.  Offered [facility] services as recommended by therapy however declined. Spouse (Richard) indicates both the pt and daughter are nurses and aware of what to do when acute symptoms are encountered. Pt already has RW and wheelchair and declined a 3-1. ? ?TOC will continue to follow up according. ? ? ?Final next level of care: Home/Self Care ?Barriers to Discharge: Barriers Resolved ? ? ?Patient Goals and CMS Choice ?Patient states their goals for this hospitalization and ongoing recovery are:: To return home ?CMS Medicare.gov Compare Post Acute Care list provided to:: Patient ?Choice offered to / list presented to : Patient, Spouse ? ?Discharge Placement ?  ?           ?  ?  ?Name of family member notified: Christean Leaf ?Patient and family notified of of transfer: 06/07/21 ? ?Discharge Plan and Services ?  ?Discharge Planning Services: CM Consult ?           ?  ?  ?  ?  ?  ?  ?  ?  ?  ?  ? ?Social Determinants of Health (SDOH) Interventions ?  ? ? ?Readmission Risk Interventions ? ?  06/06/2021  ? 11:55 AM  ?Readmission Risk Prevention Plan  ?Transportation Screening Complete  ?[place] or Home Care Consult Complete  ?Palliative Care Screening Not Applicable  ?Medication Review [employer]) Complete  ? ? ? ? ? ?

## 2021-06-25 ENCOUNTER — Encounter (INDEPENDENT_AMBULATORY_CARE_PROVIDER_SITE_OTHER): Payer: Self-pay | Admitting: Vascular Surgery

## 2021-06-25 ENCOUNTER — Ambulatory Visit (INDEPENDENT_AMBULATORY_CARE_PROVIDER_SITE_OTHER): Payer: Medicare Other | Admitting: Vascular Surgery

## 2021-06-25 VITALS — BP 128/69 | HR 94 | Resp 16 | Wt 113.8 lb

## 2021-06-25 DIAGNOSIS — N185 Chronic kidney disease, stage 5: Secondary | ICD-10-CM

## 2021-06-25 DIAGNOSIS — K922 Gastrointestinal hemorrhage, unspecified: Secondary | ICD-10-CM

## 2021-06-25 DIAGNOSIS — N186 End stage renal disease: Secondary | ICD-10-CM

## 2021-06-25 DIAGNOSIS — Z992 Dependence on renal dialysis: Secondary | ICD-10-CM

## 2021-06-25 DIAGNOSIS — I10 Essential (primary) hypertension: Secondary | ICD-10-CM

## 2021-06-25 DIAGNOSIS — E1122 Type 2 diabetes mellitus with diabetic chronic kidney disease: Secondary | ICD-10-CM

## 2021-06-25 NOTE — Assessment & Plan Note (Signed)
blood glucose control important in reducing the progression of atherosclerotic disease. Also, involved in wound healing. On appropriate medications.  

## 2021-06-25 NOTE — Assessment & Plan Note (Signed)
The patient seems to have had a successful embolization with no significant bleeding since that time.  She had 1 episode of blood in the stool, but nothing major.  No periprocedural complications.  She is following up with GI.  I will sign off at this point as I do not have much to offer and a repeat embolization would not be considered in most instances. ?

## 2021-06-25 NOTE — Assessment & Plan Note (Signed)
blood pressure control important in reducing the progression of atherosclerotic disease. On appropriate oral medications.  

## 2021-06-25 NOTE — Progress Notes (Signed)
? ? ?MRN : [id number] ? ?Misty [name] is a 81 y.o. (04-13-1940) female who presents with chief complaint of No chief complaint on file. ?. ? ?History of Present Illness: Patient returns today in follow up of her GI bleed.  She had an embolization about 3 weeks ago and has had only 1 episode of some blood in her stool since that time.  She is having some diarrhea and is scheduled to see her gastroenterologist Thursday.  She denies any fevers or chills.  Her access site is well-healed.  Not having significant abdominal pain. ? ?Current Outpatient Medications  ?Medication Sig Dispense Refill  ? acetaminophen (TYLENOL) 325 MG tablet Take 650 mg by mouth every 4 (four) hours as needed for moderate pain.    ? carvedilol (COREG) 12.5 MG tablet Take 12.5 mg by mouth 2 (two) times daily with a meal.    ? cetirizine (ZYRTEC) 10 MG tablet Take 10 mg by mouth every morning.    ? Cholecalciferol (VITAMIN D) 50 MCG (2000 UT) tablet Take 2,000 Units by mouth every Monday, Wednesday, and Friday.    ? colestipol (COLESTID) 1 g tablet Take 2 g by mouth daily.    ? Fiber Diet TABS Take 2 tablets by mouth 2 (two) times daily.    ? gabapentin (NEURONTIN) 300 MG capsule Take 1 capsule (300 mg total) by mouth at bedtime. (Patient taking differently: Take 300 mg by mouth 2 (two) times daily.) 30 capsule 1  ? glucose blood test strip 1 each by Finger Stick route 4 times daily (after meals and nightly). Use as directed    ? HUMALOG 100 UNIT/ML injection Inject 4-12 Units into the skin 2 (two) times daily with a meal.    ? hydrocortisone (ANUSOL-HC) 2.5 % rectal cream Apply 1 application. topically 3 (three) times daily.    ? INSULIN SYRINGE 1CC/29G 29G X 1/2" 1 ML MISC     ? lidocaine (LIDODERM) 5 % Place 1 patch onto the skin daily as needed (pain).    ? methocarbamol (ROBAXIN) 500 MG tablet Take 1 tablet (500 mg total) by mouth every 8 (eight) hours as needed for muscle spasms. 90 tablet 0  ? multivitamin (RENA-VIT) TABS tablet Take 1  tablet by mouth every morning.    ? pantoprazole (PROTONIX) 40 MG tablet Take 1 tablet (40 mg total) by mouth daily. 30 tablet 0  ? sevelamer carbonate (RENVELA) 800 MG tablet Take 1,600 mg by mouth 2 (two) times daily with a meal.    ? SURE COMFORT PEN NEEDLES 31G X 8 MM MISC     ? ?No current facility-administered medications for this visit.  ? ? ?Past Medical History:  ?Diagnosis Date  ? Anemia of chronic renal failure   ? Aortic valve stenosis 07/02/2017  ? a.) TTE 07/02/2017: 55-60%; mild AS with MPG 13.0 mmHg. b.) TTE 10/14/2019: EF >55%; MPG 23.0 mmHg. c.) TTE 06/23/2020: EF 50-55%; MPG 19.7 mmHg. d.) TTE 11/27/2020: EF >55%; MPG 18.0 mmHg.  ? Chronic lower back pain   ? Complication of anesthesia   ? a.) severe myalgias with succinylcholine use. b.) (+) difficult airway.  ? Diastolic dysfunction 40/98/1191  ? a.) TTE 07/02/2017; EF 55-60%; mild AR; G1DD. b.) TTE 06/23/2020: EF 50-55% with mild LVH; mild to mod LA enlargement; mild AR, mild to mod MR; G2DD.  ? Difficult airway for intubation   ? a.) 08/08/2020 at [facility] --> Initial attempt with Sabra Heck 3, larynx was "rigid/fixed"; unable to visualize anything but  epiglottis. Glidescope utilized to obtain a great view with VL. First attempt with Glidescope unsuccessful d/t anterior orientation. ETT withdrawn and a greater bend was placed. Second attempt was successful with effort. Patient sustained lip laceration during intubation attempts.  ? 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 a.) TTE 11/03/2019: measured 3.7 cm  ? ESRD on peritoneal dialysis [facility])   ? GERD (gastroesophageal reflux disease)   ? Glaucoma   ? Heart murmur   ? HLD (hyperlipidemia)   ? Hyperparathyroidism ([place])   ? a.) s/p total parathyroidectomy  ? Hypertension   ? Hypertensive retinopathy   ? Lumbar radiculopathy   ? MGUS (monoclonal gammopathy of unknown significance)   ? Motion sickness   ? Nonproliferative diabetic retinopathy ([place])   ? Osteoarthritis   ? Peritoneal dialysis  catheter in place [facility])   ? Personal history of chemotherapy   ? Personal history of radiation therapy   ? 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 a.) Clinical stage T3N0. b.) s/p 3 cycles of neoadjuvant CI 5FU + XRT c.) s/p resection on 08/08/2009.  ? Spinal stenosis of lumbar region with neurogenic claudication   ? T2DM (type 2 diabetes mellitus) ([place])   ? TIA (transient ischemic attack)   ? ? ?Past Surgical History:  ?Procedure Laterality Date  ? BREAST BIOPSY Left   ? bx/ clip-neg  ? COLONOSCOPY N/A 05/03/2020  ? Procedure: COLONOSCOPY;  Surgeon: Lesly Rubenstein, MD;  Location[facility] ENDOSCOPY;  Service: Endoscopy;  Laterality: N/A;  ? coloretcal cancer    ? DIALYSIS/PERMA CATHETER REMOVAL N/A 10/01/2020  ? Procedure: DIALYSIS/PERMA CATHETER REMOVAL;  Surgeon: Algernon Huxley, MD;  Location: [place] CV LAB;  Service: Cardiovascular;  Laterality: N/A;  ? EMBOLIZATION N/A 06/06/2021  ? Procedure: EMBOLIZATION;  Surgeon: Algernon Huxley, MD;  Location: [place] CV LAB;  Service: Cardiovascular;  Laterality: N/A;  ? LUMBAR LAMINECTOMY/DECOMPRESSION MICRODISCECTOMY Right 01/23/2021  ? Procedure: RIGHT L5-S1 DECOMPRESSION;  Surgeon: Meade Maw, MD;  Location: [facility] ORS;  Service: Neurosurgery;  Laterality: Right;  ? thyroid gland    ? parathyroid  ? TUBAL LIGATION    ? ? ? ?Social History  ? ?Tobacco Use  ? Smoking status: Never  ? Smokeless tobacco: Never  ?Vaping Use  ? Vaping Use: Never used  ?Substance Use Topics  ? Alcohol use: Never  ? Drug use: Never  ? ?  ? ? ?Family History  ?Problem Relation Age of Onset  ? Lung disease Sister   ? Diabetes Mellitus II Paternal Grandmother   ? Breast cancer Neg Hx   ? ? ? ?Allergies  ?Allergen Reactions  ? Succinylcholine Other (See Comments)  ?  Severe myalgias  ? Penicillins Rash  ?  Tolerated cefazolin prior  ? ? ? ?REVIEW OF SYSTEMS (Negative unless checked) ? ?Constitutional: '[]'$ Weight loss  '[]'$ Fever  '[]'$ Chills ?Cardiac: '[]'$ Chest pain   '[]'$ Chest pressure   '[]'$ Palpitations    '[]'$ Shortness of breath when laying flat   '[]'$ Shortness of breath at rest   '[x]'$ Shortness of breath with exertion. ?Vascular:  '[]'$ Pain in legs with walking   '[]'$ Pain in legs at rest   '[]'$ Pain in legs when laying flat   '[]'$ Claudication   '[]'$ Pain in feet when walking  '[]'$ Pain in feet at rest  '[]'$ Pain in feet when laying flat   '[]'$ History of DVT   '[]'$ Phlebitis   '[]'$ Swelling in legs   '[]'$ Varicose veins   '[]'$ Non-healing ulcers ?Pulmonary:   '[]'$ Uses home oxygen   '[]'$ Productive cough   '[]'$   Hemoptysis   '[]'$ Wheeze  '[x]'$ COPD   '[]'$ Asthma ?Neurologic:  '[]'$ Dizziness  '[]'$ Blackouts   '[]'$ Seizures   '[]'$ History of stroke   '[]'$ History of TIA  '[]'$ Aphasia   '[]'$ Temporary blindness   '[]'$ Dysphagia   '[]'$ Weakness or numbness in arms   '[]'$ Weakness or numbness in legs ?Musculoskeletal:  '[x]'$ Arthritis   '[]'$ Joint swelling   '[x]'$ Joint pain   '[]'$ Low back pain ?Hematologic:  '[]'$ Easy bruising  '[]'$ Easy bleeding   '[]'$ Hypercoagulable state   '[x]'$ Anemic   ?Gastrointestinal:  '[x]'$ Blood in stool   '[]'$ Vomiting blood  '[]'$ Gastroesophageal reflux/heartburn   '[]'$ Abdominal pain ?Genitourinary:  '[]'$ Chronic kidney disease   '[]'$ Difficult urination  '[]'$ Frequent urination  '[]'$ Burning with urination   '[]'$ Hematuria ?Skin:  '[]'$ Rashes   '[]'$ Ulcers   '[]'$ Wounds ?Psychological:  '[]'$ History of anxiety   '[]'$  History of major depression. ? ?Physical Examination ? ?There were no vitals taken for this visit. ?Gen:  WD/WN, NAD.  Somewhat debilitated appearing ?Head: [place]/AT, No temporalis wasting. ?Ear/Nose/Throat: Hearing grossly intact, nares w/o erythema or drainage ?Eyes: Conjunctiva clear. Sclera non-icteric ?Neck: Supple.  Trachea midline ?Pulmonary:  Good air movement, no use of accessory muscles.  ?Cardiac: RRR, no JVD ?Vascular:  ?Vessel Right Left  ?Radial Palpable Palpable  ?    ?    ? ?Musculoskeletal: M/S 5/5 throughout.  No deformity or atrophy.  In a wheelchair.  Trace lower extremity edema. ?Neurologic: Sensation grossly intact in extremities.  Symmetrical.  Speech is fluent.  ?Psychiatric: Judgment intact, Mood &  affect appropriate for pt's clinical situation. ?Dermatologic: No rashes or ulcers noted.  No cellulitis or open wounds. ? ? ? ? ? ?Labs ?Recent Results (from the past 2160 hour(s))  ?Basic metabolic panel

## 2021-06-27 ENCOUNTER — Ambulatory Visit: Payer: Medicare Other | Admitting: Gastroenterology

## 2021-06-27 ENCOUNTER — Telehealth: Payer: Self-pay | Admitting: Gastroenterology

## 2021-06-27 ENCOUNTER — Encounter: Payer: Self-pay | Admitting: Gastroenterology

## 2021-06-27 ENCOUNTER — Other Ambulatory Visit: Payer: Self-pay

## 2021-06-27 ENCOUNTER — Ambulatory Visit (INDEPENDENT_AMBULATORY_CARE_PROVIDER_SITE_OTHER): Payer: Medicare Other | Admitting: Gastroenterology

## 2021-06-27 VITALS — BP 149/69 | HR 97 | Temp 98.9°F

## 2021-06-27 DIAGNOSIS — Z8719 Personal history of other diseases of the digestive system: Secondary | ICD-10-CM

## 2021-06-27 MED ORDER — ONDANSETRON HCL 4 MG PO TABS: ORAL_TABLET | ORAL | 0 refills | Status: DC

## 2021-06-27 NOTE — Progress Notes (Signed)
?  ?[name] MD, MRCP(U.K) ?[address]  ?[address]  ?[place], [place] [address]  ?Main: [phone]  ?Fax: [phone] ? ? ?Primary Care Physician: Gayland Curry, MD ? ?Primary Gastroenterologist:  Dr. [name]  ? ?Chief Complaint  ?Patient presents with  ? lower gi bleed  ? ? ?HPI: Misty [name] is a 81 y.o. female ? ? ?Summary of history : ? ?She was previously a patient of [facility] gastro[facility] was last seen back in 2020 for chronic diarrhea.  History of rectal cancer in 2011 had ileostomy with reversal after chemotherapy and radiation.  Last colonoscopy was back in 2019 in [place] no abnormalities noted. ?  ?She presented emergency room 06/04/2021 with weakness.  History of rectal bleeding , multiple bloody bowel movements with clots.    Other comorbidities include end-stage renal disease on dialysis, diabetes, hypertension, CHF.  In the ER she was noted to have a hemoglobin of 7 g which was 10.9 g 4 months back.  MCV of 98.6.  And this morning hemoglobin is 8.7 g..  She has a history of multiple myeloma.   ?She underwent a CT angiogram which was positive for a bleed in the area of the anastomosis and underwent embolization by vascular surgery. ? ? ?Interval history   06/07/2021-06/27/2021 ? ?06/08/2021 hemoglobin 8.3 g ?She states that she was doing well then a few days back had few episodes of rectal bleeding.  She is concerned.  No NSAID use.  No blood thinner use.  She recollects that she has not had her whole colon taken out. ? ? ?Current Outpatient Medications  ?Medication Sig Dispense Refill  ? acetaminophen (TYLENOL) 325 MG tablet Take 650 mg by mouth every 4 (four) hours as needed for moderate pain.    ? carvedilol (COREG) 12.5 MG tablet Take 12.5 mg by mouth 2 (two) times daily with a meal.    ? cetirizine (ZYRTEC) 10 MG tablet Take 10 mg by mouth every morning.    ? Cholecalciferol (VITAMIN D) 50 MCG (2000 UT) tablet Take 2,000 Units by mouth every Monday, Wednesday, and  Friday.    ? Fiber Diet TABS Take 2 tablets by mouth 2 (two) times daily.    ? gabapentin (NEURONTIN) 300 MG capsule Take 1 capsule (300 mg total) by mouth at bedtime. (Patient taking differently: Take 300 mg by mouth 2 (two) times daily.) 30 capsule 1  ? glucose blood test strip 1 each by Finger Stick route 4 times daily (after meals and nightly). Use as directed    ? HUMALOG 100 UNIT/ML injection Inject 4-12 Units into the skin 2 (two) times daily with a meal.    ? hydrocortisone (ANUSOL-HC) 2.5 % rectal cream Apply 1 application. topically daily as needed.    ? INSULIN SYRINGE 1CC/29G 29G X 1/2" 1 ML MISC     ? lidocaine (LIDODERM) 5 % Place 1 patch onto the skin daily as needed (pain).    ? methocarbamol (ROBAXIN) 500 MG tablet Take 1 tablet (500 mg total) by mouth every 8 (eight) hours as needed for muscle spasms. 90 tablet 0  ? multivitamin (RENA-VIT) TABS tablet Take 1 tablet by mouth every morning.    ? pantoprazole (PROTONIX) 40 MG tablet Take 1 tablet (40 mg total) by mouth daily. 30 tablet 0  ? sevelamer carbonate (RENVELA) 800 MG tablet Take 1,600 mg by mouth 2 (two) times daily with a meal.    ? SURE COMFORT PEN NEEDLES 31G X 8 MM MISC     ?  colestipol (COLESTID) 1 g tablet Take 2 g by mouth daily.    ? ?No current facility-administered medications for this visit.  ? ? ?Allergies as of 06/27/2021 - Review Complete 06/27/2021  ?Allergen Reaction Noted  ? Succinylcholine Other (See Comments) 04/12/2020  ? Penicillins Rash 10/31/2008  ? ? ?ROS: ? ?General: Negative for anorexia, weight loss, fever, chills, fatigue, weakness. ?ENT: Negative for hoarseness, difficulty swallowing , nasal congestion. ?CV: Negative for chest pain, angina, palpitations, dyspnea on exertion, peripheral edema.  ?Respiratory: Negative for dyspnea at rest, dyspnea on exertion, cough, sputum, wheezing.  ?GI: See history of present illness. ?GU:  Negative for dysuria, hematuria, urinary incontinence, urinary frequency, nocturnal  urination.  ?Endo: Negative for unusual weight change.  ?  ?Physical Examination: ? ? BP (!) 149/69   Pulse 97   Temp 98.9 ?F (37.2 ?C) (Oral)  ? ?General: Well-nourished, well-developed in no acute distress.  ?Eyes: No icterus. Conjunctivae pink. ?Mouth: Oropharyngeal mucosa moist and pink , no lesions erythema or exudate. ?Neuro: Alert and oriented x 3.  Grossly intact. ?Skin: Warm and dry, no jaundice.   ?Psych: Alert and cooperative, normal mood and affect. ? ? ?Imaging Studies: ?CT Head Wo Contrast ? ?Result Date: 06/04/2021 ?CLINICAL DATA:  Head injury. EXAM: CT HEAD WITHOUT CONTRAST CT CERVICAL SPINE WITHOUT CONTRAST TECHNIQUE: Multidetector CT imaging of the head and cervical spine was performed following the standard protocol without intravenous contrast. Multiplanar CT image reconstructions of the cervical spine were also generated. RADIATION DOSE REDUCTION: This exam was performed according to the departmental dose-optimization program which includes automated exposure control, adjustment of the mA and/or kV according to patient size and/or use of iterative reconstruction technique. COMPARISON:  None. FINDINGS: CT HEAD FINDINGS Brain: No evidence of acute infarction, hemorrhage, hydrocephalus, extra-axial collection or mass lesion/mass effect. Vascular: No hyperdense vessel or unexpected calcification. Skull: Normal. Negative for fracture or focal lesion. Sinuses/Orbits: No acute finding. Other: None. CT CERVICAL SPINE FINDINGS Alignment: Normal. Skull base and vertebrae: No acute fracture. No primary bone lesion or focal pathologic process. Soft tissues and spinal canal: No prevertebral fluid or swelling. No visible canal hematoma. Disc levels: Moderate degenerative disc disease is noted at C3-4, C4-5, C5-6 and C6-7. Upper chest: Negative. Other: Mild degenerative changes are seen involving the posterior facet joints bilaterally. IMPRESSION: No acute intracranial abnormality seen. 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 No acute abnormality is noted. Electronically Signed   By: Marijo Conception M.D.   On: 06/04/2021 16:54  ? ?CT Cervical Spine Wo Contrast ? ?Result Date: 06/04/2021 ?CLINICAL DATA:  Head injury. EXAM: CT HEAD WITHOUT CONTRAST CT CERVICAL SPINE WITHOUT CONTRAST TECHNIQUE: Multidetector CT imaging of the head and cervical spine was performed following the standard protocol without intravenous contrast. Multiplanar CT image reconstructions of the cervical spine were also generated. RADIATION DOSE REDUCTION: This exam was performed according to the departmental dose-optimization program which includes automated exposure control, adjustment of the mA and/or kV according to patient size and/or use of iterative reconstruction technique. COMPARISON:  None. FINDINGS: CT HEAD FINDINGS Brain: No evidence of acute infarction, hemorrhage, hydrocephalus, extra-axial collection or mass lesion/mass effect. Vascular: No hyperdense vessel or unexpected calcification. Skull: Normal. Negative for fracture or focal lesion. Sinuses/Orbits: No acute finding. Other: None. CT CERVICAL SPINE FINDINGS Alignment: Normal. Skull base and vertebrae: No acute fracture. No primary bone lesion or focal pathologic process. Soft tissues and spinal canal: No prevertebral fluid or swelling. No visible canal  hematoma. Disc levels: Moderate degenerative disc disease is noted at C3-4, C4-5, C5-6 and C6-7. Upper chest: Negative. Other: Mild degenerative changes are seen involving the posterior facet joints bilaterally. IMPRESSION: No acute intracranial abnormality seen. 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 No acute abnormality is noted. Electronically Signed   By: Marijo Conception M.D.   On: 06/04/2021 16:54  ? ?PERIPHERAL VASCULAR CATHETERIZATION ? ?Result Date: 06/06/2021 ?See surgical note for result. ? ?CT ANGIO GI BLEED ? ?Result Date: 06/05/2021 ?CLINICAL  DATA:  Evaluate for lower GI bleed. History of rectal bleeding since Sunday. EXAM: CTA ABDOMEN AND PELVIS WITHOUT AND WITH CONTRAST TECHNIQUE: Multidetector CT imaging of the abdomen and pelvis was performed using the

## 2021-06-27 NOTE — Telephone Encounter (Signed)
Patients husband states that Dr Vicente Males saw the patient in the hospital and needed to follow up with him. States that the patient is no longer going to [facility]. Requesting call back ASAP ?

## 2021-06-27 NOTE — Addendum Note (Signed)
Addended by: Wayna Chalet on: 06/27/2021 01:45 PM ? ? Modules accepted: Orders ? ?

## 2021-06-27 NOTE — Telephone Encounter (Signed)
After speaking to Dr. Vicente Males he stated that he would care for the patient if she wants to transfer care to Misty [name]. I then called the patient and stated that she will come and see Dr. Vicente Males. ?

## 2021-06-28 LAB — CBC WITH DIFFERENTIAL/PLATELET
Basophils Absolute: 0.1 10*3/uL (ref 0.0–0.2)
Basos: 1 %
EOS (ABSOLUTE): 0.5 10*3/uL — ABNORMAL HIGH (ref 0.0–0.4)
Eos: 5 %
Hematocrit: 29.2 % — ABNORMAL LOW (ref 34.0–46.6)
Hemoglobin: 9.6 g/dL — ABNORMAL LOW (ref 11.1–15.9)
Immature Grans (Abs): 0.1 10*3/uL (ref 0.0–0.1)
Immature Granulocytes: 1 %
Lymphocytes Absolute: 1.3 10*3/uL (ref 0.7–3.1)
Lymphs: 12 %
MCH: 30.4 pg (ref 26.6–33.0)
MCHC: 32.9 g/dL (ref 31.5–35.7)
MCV: 92 fL (ref 79–97)
Monocytes Absolute: 0.7 10*3/uL (ref 0.1–0.9)
Monocytes: 6 %
Neutrophils Absolute: 8.3 10*3/uL — ABNORMAL HIGH (ref 1.4–7.0)
Neutrophils: 75 %
Platelets: 291 10*3/uL (ref 150–450)
RBC: 3.16 x10E6/uL — ABNORMAL LOW (ref 3.77–5.28)
RDW: 15.8 % — ABNORMAL HIGH (ref 11.7–15.4)
WBC: 10.9 10*3/uL — ABNORMAL HIGH (ref 3.4–10.8)

## 2021-07-01 ENCOUNTER — Other Ambulatory Visit: Payer: Self-pay

## 2021-07-01 ENCOUNTER — Ambulatory Visit
Admission: RE | Admit: 2021-07-01 | Discharge: 2021-07-01 | Disposition: A | Discharge status: Home / Self Care | Payer: Medicare Other | Attending: Gastroenterology | Admitting: Gastroenterology

## 2021-07-01 ENCOUNTER — Ambulatory Visit: Payer: Medicare Other | Admitting: Anesthesiology

## 2021-07-01 ENCOUNTER — Encounter: Payer: Self-pay | Admitting: Gastroenterology

## 2021-07-01 ENCOUNTER — Encounter
Admission: RE | Disposition: A | Discharge status: Home / Self Care | Payer: Self-pay | Source: Home / Self Care | Attending: Gastroenterology

## 2021-07-01 DIAGNOSIS — K635 Polyp of colon: Secondary | ICD-10-CM | POA: Diagnosis not present

## 2021-07-01 DIAGNOSIS — K625 Hemorrhage of anus and rectum: Secondary | ICD-10-CM | POA: Diagnosis present

## 2021-07-01 DIAGNOSIS — Z539 Procedure and treatment not carried out, unspecified reason: Secondary | ICD-10-CM | POA: Insufficient documentation

## 2021-07-01 DIAGNOSIS — Z980 Intestinal bypass and anastomosis status: Secondary | ICD-10-CM | POA: Insufficient documentation

## 2021-07-01 DIAGNOSIS — Z8719 Personal history of other diseases of the digestive system: Secondary | ICD-10-CM

## 2021-07-01 SURGERY — COLONOSCOPY WITH PROPOFOL
Anesthesia: General

## 2021-07-01 MED ORDER — ONDANSETRON HCL 4 MG PO TABS: ORAL_TABLET | ORAL | 0 refills | Status: DC

## 2021-07-01 MED ORDER — PROPOFOL 500 MG/50ML IV EMUL
INTRAVENOUS | Status: AC
  Filled 2021-07-01: qty 50

## 2021-07-01 NOTE — Op Note (Incomplete)
Patient arrives and states she was unable to complete prep for vomiting.She states she has taken about 3/4 of prep.She continues to have liquid stools with sediment in pull up.Dr.Anna comes in and discusses the importance of getting one good exam due to recent G.I bleed.She is rescheduled for tomorrow morning. ?

## 2021-07-01 NOTE — Anesthesia Preprocedure Evaluation (Addendum)
Anesthesia Evaluation  ?Patient identified by MRN, date of birth, ID band ?Patient awake ? ?General Assessment Comment: ?Previous airway note (anesthetic I personally supervised): ? ?Anticipated difficult airway. Glidescope 4 blade utilized with Grade 3 view. Second attempt at laryngoscopy with Glidcescope 3 resulted in Grade 1 view with easy intubation. Atraumatic. ? ?Prior airway note at OSH before that: ? ?"1st attempt with Miller 3, rigid/fixed anterior larynx, unable to visualize anything but epiglottis. ?Glidescope to OR, great view with VL, 1st attempt to pass ETT unsuccessful due to anterior orientation. ETT out, greater bend placed on ETT and attempts to pass 2nd time successful with effort. " ? ?Hx severe myalgias after succinylcholine. Does not appear to be a malignant hyperthermia episode; patient says all she had was muscle aches, no life threatening reactions, and it seems she had inhalational anesthetics since then. ? ?Reviewed: ?Allergy & Precautions, NPO status , Patient's Chart, lab work & pertinent test results ? ?History of Anesthesia Complications ?(+) DIFFICULT AIRWAY and history of anesthetic complications ? ?Airway ?Mallampati: III ? ?TM Distance: >3 FB ?Neck ROM: Limited ? ? ? Dental ? ?(+) Poor Dentition, Implants ?  ?Pulmonary ?neg pulmonary ROS, neg sleep apnea, neg COPD, Patient abstained from smoking.Not current smoker,  ?  ?Pulmonary exam normal ?breath sounds clear to auscultation ? ? ? ? ? ? Cardiovascular ?Exercise Tolerance: Good ?METShypertension, Pt. on medications ?+CHF  ?(-) CAD and (-) Past MI (-) dysrhythmias + Valvular Problems/Murmurs AS  ?Rhythm:Regular Rate:Normal ?+ Systolic murmurs ?TTE 2022: ?IMPRESSIONS  ? ? ??1. Left ventricular ejection fraction, by estimation, is 50 to 55%. The  ?left ventricle has low normal function. The left ventricle has no regional  ?wall motion abnormalities. There is moderate left ventricular hypertrophy.   ?Left ventricular diastolic  ?parameters are consistent with Grade II diastolic dysfunction  ?(pseudonormalization).  ??2. Right ventricular systolic function is normal. The right ventricular  ?size is normal.  ??3. Left atrial size was mild to moderately dilated.  ??4. The mitral valve is normal in structure. Mild to moderate mitral valve  ?regurgitation.  ??5. The aortic valve is calcified. Aortic valve regurgitation is mild.  ?Mild to moderate aortic valve stenosis.  ?  ?Neuro/Psych ?TIA Neuromuscular disease negative psych ROS  ? GI/Hepatic ?GERD  ,(+)  ?  ? (-) substance abuse ? ,   ?Endo/Other  ?diabetes ? Renal/GU ?ESRF and DialysisRenal diseaseOn peritoneal dialysis , last dialyzed two days ago. Feels well, not fluid overloaded. Due for dialysis today  ? ?  ?Musculoskeletal ? ?(+) Arthritis ,  ? Abdominal ?  ?Peds ? Hematology ? ?(+) Blood dyscrasia, anemia ,   ?Anesthesia Other Findings ?Past Medical History: ?No date: Anemia of chronic renal failure ?07/02/2017: Aortic valve stenosis ?    Comment:  a.) TTE 07/02/2017: 55-60%; mild AS with MPG 13.0 mmHg.  ?             b.) TTE 10/14/2019: EF >55%; MPG 23.0 mmHg. c.) TTE  ?             06/23/2020: EF 50-55%; MPG 19.7 mmHg. d.) TTE 11/27/2020: ?             EF >55%; MPG 18.0 mmHg. ?No date: Chronic lower back pain ?No date: Complication of anesthesia ?    Comment:  a.) severe myalgias with succinylcholine use. b.) (+)  ?             difficult airway. ?36/14/4315: Diastolic dysfunction ?    Comment:  a.) TTE 07/02/2017; EF 55-60%; mild AR; G1DD. b.) TTE  ?             06/23/2020: EF 50-55% with mild LVH; mild to mod LA  ?             enlargement; mild AR, mild to mod MR; G2DD. ?No date: Difficult airway for intubation ?    Comment:  a.) 08/08/2020 at [facility] --> Initial attempt with Sabra Heck  ?             3, larynx was "rigid/fixed"; unable to visualize anything ?             but epiglottis. Glidescope utilized to obtain a great  ?             view with VL.  First attempt with Glidescope unsuccessful  ?             d/t anterior orientation. ETT withdrawn and a greater  ?             bend was placed. Second attempt was successful with  ?             effort. Patient sustained lip laceration during  ?             intubation attempts. ?11/03/2019: Dilatation of thoracic aorta ([place]) ?    Comment:  a.) TTE 11/03/2019: measured 3.7 cm ?No date: ESRD on peritoneal dialysis [facility]) ?No date: GERD (gastroesophageal reflux disease) ?No date: Glaucoma ?No date: Heart murmur ?No date: HLD (hyperlipidemia) ?No date: Hyperparathyroidism ([place]) ?    Comment:  a.) s/p total parathyroidectomy ?No date: Hypertension ?No date: Hypertensive retinopathy ?No date: Lumbar radiculopathy ?No date: MGUS (monoclonal gammopathy of unknown significance) ?No date: Motion sickness ?No date: Nonproliferative diabetic retinopathy ([place]) ?No date: Osteoarthritis ?No date: Peritoneal dialysis catheter in place [facility]) ?No date: Personal history of chemotherapy ?No date: Personal history of radiation therapy ?2011: Rectal cancer ([place]) ?    Comment:  a.) Clinical stage T3N0. b.) s/p 3 cycles of neoadjuvant ?             CI 5FU + XRT c.) s/p resection on 08/08/2009. ?No date: Spinal stenosis of lumbar region with neurogenic claudication ?No date: T2DM (type 2 diabetes mellitus) ([place]) ?No date: TIA (transient ischemic attack) ? Reproductive/Obstetrics ? ?  ? ? ? ? ? ? ? ? ? ? ? ? ? ?  ?  ? ? ? ? ? ? ? ?Anesthesia Physical ? ?Anesthesia Plan ? ?ASA: 3 ? ?Anesthesia Plan: General  ? ?Post-op Pain Management: Minimal or no pain anticipated  ? ?Induction: Intravenous ? ?PONV Risk Score and Plan: 3 and Ondansetron, Treatment may vary due to age or medical condition, Propofol infusion and TIVA ? ?Airway Management Planned: Natural Airway ? ?Additional Equipment: Arterial line ? ?Intra-op Plan:  ? ?Post-operative Plan:  ? ?Informed Consent: I have reviewed the patients History and Physical, chart, labs and discussed the  procedure including the risks, benefits and alternatives for the proposed anesthesia with the patient or authorized representative who has indicated his/her understanding and acceptance.  ? ? ? ?Dental advisory given ? ?Plan Discussed with: CRNA and Surgeon ? ?Anesthesia Plan Comments: (Discussed risks of anesthesia with patient, including PONV, sore throat, lip/dental/eye damage. Rare risks discussed as well, such as cardiorespiratory and neurological sequelae, and allergic reactions. Discussed the role of CRNA in patient's perioperative care. Patient understands.)  ? ? ? ? ? ?Anesthesia Quick Evaluation ? ?

## 2021-07-02 ENCOUNTER — Ambulatory Visit: Payer: Medicare Other

## 2021-07-02 ENCOUNTER — Other Ambulatory Visit: Payer: Self-pay

## 2021-07-02 ENCOUNTER — Encounter: Payer: Self-pay | Admitting: Gastroenterology

## 2021-07-02 ENCOUNTER — Ambulatory Visit
Admission: RE | Admit: 2021-07-02 | Discharge: 2021-07-02 | Disposition: A | Discharge status: Home / Self Care | Payer: Medicare Other | Attending: Gastroenterology | Admitting: Gastroenterology

## 2021-07-02 ENCOUNTER — Encounter
Admission: RE | Disposition: A | Discharge status: Home / Self Care | Payer: Self-pay | Source: Home / Self Care | Attending: Gastroenterology

## 2021-07-02 DIAGNOSIS — I509 Heart failure, unspecified: Secondary | ICD-10-CM | POA: Diagnosis not present

## 2021-07-02 DIAGNOSIS — I132 Hypertensive heart and chronic kidney disease with heart failure and with stage 5 chronic kidney disease, or end stage renal disease: Secondary | ICD-10-CM | POA: Diagnosis not present

## 2021-07-02 DIAGNOSIS — K635 Polyp of colon: Secondary | ICD-10-CM | POA: Diagnosis not present

## 2021-07-02 DIAGNOSIS — E1122 Type 2 diabetes mellitus with diabetic chronic kidney disease: Secondary | ICD-10-CM | POA: Insufficient documentation

## 2021-07-02 DIAGNOSIS — K625 Hemorrhage of anus and rectum: Secondary | ICD-10-CM | POA: Insufficient documentation

## 2021-07-02 DIAGNOSIS — D631 Anemia in chronic kidney disease: Secondary | ICD-10-CM | POA: Diagnosis not present

## 2021-07-02 DIAGNOSIS — Z85048 Personal history of other malignant neoplasm of rectum, rectosigmoid junction, and anus: Secondary | ICD-10-CM | POA: Insufficient documentation

## 2021-07-02 DIAGNOSIS — Z980 Intestinal bypass and anastomosis status: Secondary | ICD-10-CM | POA: Diagnosis not present

## 2021-07-02 DIAGNOSIS — D124 Benign neoplasm of descending colon: Secondary | ICD-10-CM | POA: Diagnosis not present

## 2021-07-02 DIAGNOSIS — Z992 Dependence on renal dialysis: Secondary | ICD-10-CM | POA: Diagnosis not present

## 2021-07-02 DIAGNOSIS — Z794 Long term (current) use of insulin: Secondary | ICD-10-CM | POA: Diagnosis not present

## 2021-07-02 DIAGNOSIS — N186 End stage renal disease: Secondary | ICD-10-CM | POA: Insufficient documentation

## 2021-07-02 DIAGNOSIS — K5730 Diverticulosis of large intestine without perforation or abscess without bleeding: Secondary | ICD-10-CM | POA: Insufficient documentation

## 2021-07-02 DIAGNOSIS — Z8719 Personal history of other diseases of the digestive system: Secondary | ICD-10-CM | POA: Diagnosis not present

## 2021-07-02 DIAGNOSIS — K219 Gastro-esophageal reflux disease without esophagitis: Secondary | ICD-10-CM | POA: Diagnosis not present

## 2021-07-02 DIAGNOSIS — D122 Benign neoplasm of ascending colon: Secondary | ICD-10-CM | POA: Insufficient documentation

## 2021-07-02 DIAGNOSIS — Z79899 Other long term (current) drug therapy: Secondary | ICD-10-CM | POA: Insufficient documentation

## 2021-07-02 HISTORY — PX: COLONOSCOPY WITH PROPOFOL: SHX5780

## 2021-07-02 LAB — GLUCOSE, CAPILLARY: Glucose-Capillary: 195 mg/dL — ABNORMAL HIGH (ref 70–99)

## 2021-07-02 SURGERY — COLONOSCOPY WITH PROPOFOL
Anesthesia: General

## 2021-07-02 MED ORDER — SODIUM CHLORIDE 0.9 % IV SOLN: INTRAVENOUS | Status: DC

## 2021-07-02 MED ORDER — LIDOCAINE HCL (CARDIAC) PF 100 MG/5ML IV SOSY
PREFILLED_SYRINGE | INTRAVENOUS | Status: DC | PRN
  Administered 2021-07-02: 100 mg via INTRAVENOUS

## 2021-07-02 MED ORDER — PROPOFOL 10 MG/ML IV BOLUS
INTRAVENOUS | Status: DC | PRN
  Administered 2021-07-02: 40 mg via INTRAVENOUS

## 2021-07-02 MED ORDER — PHENYLEPHRINE HCL (PRESSORS) 10 MG/ML IV SOLN
INTRAVENOUS | Status: DC | PRN
  Administered 2021-07-02 (×3): 160 ug via INTRAVENOUS
  Administered 2021-07-02: 80 ug via INTRAVENOUS

## 2021-07-02 MED ORDER — PROPOFOL 500 MG/50ML IV EMUL
INTRAVENOUS | Status: DC | PRN
  Administered 2021-07-02: 150 ug/kg/min via INTRAVENOUS

## 2021-07-02 NOTE — Transfer of Care (Signed)
Immediate Anesthesia Transfer of Care Note ? ?Patient: Misty [name] ? ?Procedure(s) Performed: COLONOSCOPY WITH PROPOFOL ? ?Patient Location: Endoscopy Unit ? ?Anesthesia Type:General ? ?Level of Consciousness: drowsy ? ?Airway & Oxygen Therapy: Patient Spontanous Breathing ? ?Post-op Assessment: Report given to RN and Post -op Vital signs reviewed and stable ? ?Post vital signs: Reviewed and stable ? ?Last Vitals:  ?Vitals Value Taken Time  ?BP 133/26 07/02/21 1027  ?Temp    ?Pulse 73 07/02/21 1027  ?Resp 13 07/02/21 1027  ?SpO2 100 % 07/02/21 1027  ? ? ?Last Pain:  ?Vitals:  ? 07/02/21 1027  ?TempSrc:   ?PainSc: Asleep  ?   ? ?  ? ?Complications: No notable events documented. ?

## 2021-07-02 NOTE — Anesthesia Postprocedure Evaluation (Signed)
Anesthesia Post Note ? ?Patient: Misty [name] ? ?Procedure(s) Performed: COLONOSCOPY WITH PROPOFOL ? ?Patient location during evaluation: Endoscopy ?Anesthesia Type: General ?Level of consciousness: awake and alert ?Pain management: pain level controlled ?Vital Signs Assessment: post-procedure vital signs reviewed and stable ?Respiratory status: spontaneous breathing, nonlabored ventilation, respiratory function stable and patient connected to nasal cannula oxygen ?Cardiovascular status: blood pressure returned to baseline and stable ?Postop Assessment: no apparent nausea or vomiting ?Anesthetic complications: no ? ? ?No notable events documented. ? ? ?Last Vitals:  ?Vitals:  ? 07/02/21 1037 07/02/21 1047  ?BP: (!) 123/35 (!) 113/50  ?Pulse: 87 91  ?Resp: 15 15  ?Temp:    ?SpO2: 100% 100%  ?  ?Last Pain:  ?Vitals:  ? 07/02/21 1047  ?TempSrc:   ?PainSc: 0-No pain  ? ? ?  ?  ?  ?  ?  ?  ? ?Martha Clan ? ? ? ? ?

## 2021-07-02 NOTE — Addendum Note (Signed)
Addendum  created 07/02/21 0950 by Donnella Bi, RN  ? Attached Procedures edited  ?  ?

## 2021-07-02 NOTE — Addendum Note (Signed)
Addendum  created 07/02/21 0918 by Lia Foyer, CRNA  ? Intraprocedure Staff edited  ?  ?

## 2021-07-02 NOTE — Op Note (Signed)
[facility] ?Gastroenterology ?Patient Name: Misty [name] ?Procedure Date: 07/02/2021 9:34 AM ?MRN: [id number] ?Account #: [id number] ?Date of Birth: June 10, 1940 ?Admit Type: Outpatient ?Age: 81 ?Room: [facility] ENDO ROOM 2 ?Gender: Female ?Note Status: Finalized ?Instrument Name: Colonscope [id number] ?Procedure:             Colonoscopy ?Indications:           Rectal bleeding ?Providers:             [name] MD, MD ?Referring MD:          Gayland Curry MD, MD (Referring MD) ?Medicines:             Monitored Anesthesia Care ?Procedure:             Pre-Anesthesia Assessment: ?                       - Prior to the procedure, a History and Physical was  ?                       performed, and patient medications, allergies and  ?                       sensitivities were reviewed. The patient's tolerance  ?                       of previous anesthesia was reviewed. ?                       - The risks and benefits of the procedure and the  ?                       sedation options and risks were discussed with the  ?                       patient. All questions were answered and informed  ?                       consent was obtained. ?                       - ASA Grade Assessment: III - A patient with severe  ?                       systemic disease. ?                       After obtaining informed consent, the colonoscope was  ?                       passed under direct vision. Throughout the procedure,  ?                       the patient's blood pressure, pulse, and oxygen  ?                       saturations were monitored continuously. The  ?                       Colonoscope was introduced through the anus and  ?  advanced to the the cecum, identified by the  ?                       appendiceal orifice. The colonoscopy was performed  ?                       without difficulty. The patient tolerated the  ?                       procedure well. The quality of the bowel preparation   ?                       was adequate. ?Findings: ?     The perianal and digital rectal examinations were normal. ?     There was evidence of a prior end-to-side colo-colonic anastomosis in  ?     the rectum. This was characterized by healthy appearing mucosa. ?     A 5 mm polyp was found in the descending colon. The polyp was sessile.  ?     The polyp was removed with a cold snare. Resection and retrieval were  ?     complete. ?     Four sessile polyps were found in the ascending colon. The polyps were 4  ?     to 6 mm in size. These polyps were removed with a cold snare. Resection  ?     and retrieval were complete. ?     The exam was otherwise without abnormality. ?     A few small-mouthed diverticula were found in the sigmoid colon. ?Impression:            - End-to-side colo-colonic anastomosis, characterized  ?                       by healthy appearing mucosa. ?                       - One 5 mm polyp in the descending colon, removed with  ?                       a cold snare. Resected and retrieved. ?                       - Four 4 to 6 mm polyps in the ascending colon,  ?                       removed with a cold snare. Resected and retrieved. ?                       - The examination was otherwise normal. ?Recommendation:        - Discharge patient to home (with escort). ?                       - Resume previous diet. ?                       - Await pathology results. ?                       - Repeat colonoscopy is not recommended due to current  ?  age (5 years or older) for surveillance. ?Procedure Code(s):     --- Professional --- ?                       [phone], Colonoscopy, flexible; with removal of  ?                       tumor(s), polyp(s), or other lesion(s) by snare  ?                       technique ?Diagnosis Code(s):     --- Professional --- ?                       Z98.0, Intestinal bypass and anastomosis status ?                       K63.5, Polyp of colon ?                        K62.5, Hemorrhage of anus and rectum ?CPT copyright 2019 American Medical Association. All rights reserved. ?The codes documented in this report are preliminary and upon coder review may  ?be revised to meet current compliance requirements. ?[name], MD ?[name] MD, MD ?07/02/2021 10:27:01 AM ?This report has been signed electronically. ?Number of Addenda: 0 ?Note Initiated On: 07/02/2021 9:34 AM ?Scope Withdrawal Time: 0 hours 8 minutes 8 seconds  ?Total Procedure Duration: 0 hours 17 minutes 16 seconds  ?Estimated Blood Loss:  Estimated blood loss: none. Estimated blood loss: none. ?     [facility] ?

## 2021-07-02 NOTE — H&P (Signed)
? ? ? ?[name], MD ?[address] [place], [place], [place], [address] ?[address] [place], [place], [place], [address] ?Phone: [phone]  ?Fax: [phone] ? ?Primary Care Physician:  Gayland Curry, MD ? ? ?Pre-Procedure History & Physical: ?HPI:  Misty [name] is a 81 y.o. female is here for an colonoscopy. ?  ?Past Medical History:  ?Diagnosis Date  ? Anemia of chronic renal failure   ? Aortic valve stenosis 07/02/2017  ? a.) TTE 07/02/2017: 55-60%; mild AS with MPG 13.0 mmHg. b.) TTE 10/14/2019: EF >55%; MPG 23.0 mmHg. c.) TTE 06/23/2020: EF 50-55%; MPG 19.7 mmHg. d.) TTE 11/27/2020: EF >55%; MPG 18.0 mmHg.  ? Chronic lower back pain   ? Complication of anesthesia   ? a.) severe myalgias with succinylcholine use. b.) (+) difficult airway.  ? Diastolic dysfunction 93/79/0240  ? a.) TTE 07/02/2017; EF 55-60%; mild AR; G1DD. b.) TTE 06/23/2020: EF 50-55% with mild LVH; mild to mod LA enlargement; mild AR, mild to mod MR; G2DD.  ? Difficult airway for intubation   ? a.) 08/08/2020 at [facility] --> Initial attempt with Sabra Heck 3, larynx was "rigid/fixed"; unable to visualize anything but epiglottis. Glidescope utilized to obtain a great view with VL. First attempt with Glidescope unsuccessful d/t anterior orientation. ETT withdrawn and a greater bend was placed. Second attempt was successful with effort. Patient sustained lip laceration during intubation attempts.  ? 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 a.) TTE 11/03/2019: measured 3.7 cm  ? ESRD on peritoneal dialysis [facility])   ? GERD (gastroesophageal reflux disease)   ? Glaucoma   ? Heart murmur   ? HLD (hyperlipidemia)   ? Hyperparathyroidism ([place])   ? a.) s/p total parathyroidectomy  ? Hypertension   ? Hypertensive retinopathy   ? Lumbar radiculopathy   ? MGUS (monoclonal gammopathy of unknown significance)   ? Motion sickness   ? Nonproliferative diabetic retinopathy ([place])   ? Osteoarthritis   ? Peritoneal dialysis catheter in place  [facility])   ? Personal history of chemotherapy   ? Personal history of radiation therapy   ? Rectal cancer ([place]) 2011  ? a.) Clinical stage T3N0. b.) s/p 3 cycles of neoadjuvant CI 5FU + XRT c.) s/p resection on 08/08/2009.  ? Spinal stenosis of lumbar region with neurogenic claudication   ? T2DM (type 2 diabetes mellitus) ([place])   ? TIA (transient ischemic attack)   ? ? ?Past Surgical History:  ?Procedure Laterality Date  ? BACK SURGERY    ? BREAST BIOPSY Left   ? bx/ clip-neg  ? COLON SURGERY    ? COLONOSCOPY N/A 05/03/2020  ? Procedure: COLONOSCOPY;  Surgeon: Lesly Rubenstein, MD;  Location: [facility] ENDOSCOPY;  Service: Endoscopy;  Laterality: N/A;  ? coloretcal cancer    ? DIALYSIS/PERMA CATHETER REMOVAL N/A 10/01/2020  ? Procedure: DIALYSIS/PERMA CATHETER REMOVAL;  Surgeon: Algernon Huxley, MD;  Location: [place] CV LAB;  Service: Cardiovascular;  Laterality: N/A;  ? EMBOLIZATION N/A 06/06/2021  ? Procedure: EMBOLIZATION;  Surgeon: Algernon Huxley, MD;  Location: [place] CV LAB;  Service: Cardiovascular;  Laterality: N/A;  ? LUMBAR LAMINECTOMY/DECOMPRESSION MICRODISCECTOMY Right 01/23/2021  ? Procedure: RIGHT L5-S1 DECOMPRESSION;  Surgeon: Meade Maw, MD;  Location: [facility] ORS;  Service: Neurosurgery;  Laterality: Right;  ? thyroid gland    ? parathyroid  ? TUBAL LIGATION    ? ? ?Prior to Admission medications   ?Medication Sig Start Date End Date Taking? Authorizing Provider  ?Cholecalciferol (VITAMIN D)  50 MCG (2000 UT) tablet Take 2,000 Units by mouth every Monday, Wednesday, and Friday.   Yes [provider]  ?gabapentin (NEURONTIN) 300 MG capsule Take 1 capsule (300 mg total) by mouth at bedtime. ?Patient taking differently: Take 300 mg by mouth 2 (two) times daily. 06/23/20  Yes Lorella Nimrod, MD  ?acetaminophen (TYLENOL) 325 MG tablet Take 650 mg by mouth every 4 (four) hours as needed for moderate pain.    [provider]  ?carvedilol (COREG) 12.5 MG tablet Take 12.5 mg by mouth  2 (two) times daily with a meal. ?Patient not taking: Reported on 07/01/2021    [provider]  ?cetirizine (ZYRTEC) 10 MG tablet Take 10 mg by mouth every morning.    [provider]  ?colestipol (COLESTID) 1 g tablet Take 2 g by mouth daily. 10/04/18 06/04/21  [provider]  ?Fiber Diet TABS Take 2 tablets by mouth 2 (two) times daily.    [provider]  ?glucose blood test strip 1 each by Finger Stick route 4 times daily (after meals and nightly). Use as directed 12/24/17   [provider]  ?HUMALOG 100 UNIT/ML injection Inject 4-12 Units into the skin 2 (two) times daily with a meal. 05/20/18   [provider]  ?hydrocortisone (ANUSOL-HC) 2.5 % rectal cream Apply 1 application. topically daily as needed. ?Patient not taking: Reported on 07/01/2021 06/03/21   [provider]  ?INSULIN SYRINGE 1CC/29G 29G X 1/2" 1 ML MISC     [provider]  ?lidocaine (LIDODERM) 5 % Place 1 patch onto the skin daily as needed (pain).    [provider]  ?methocarbamol (ROBAXIN) 500 MG tablet Take 1 tablet (500 mg total) by mouth every 8 (eight) hours as needed for muscle spasms. ?Patient not taking: Reported on 07/01/2021 01/23/21   Loleta Dicker, PA  ?multivitamin (RENA-VIT) TABS tablet Take 1 tablet by mouth every morning. 05/22/20   [provider]  ?ondansetron (ZOFRAN) 4 MG tablet Take 1 tablet (4 MG) before prep starts. Take 3 tablets (9 MG) as needed 6 hours after for nausea. 07/01/21   [name], MD  ?pantoprazole (PROTONIX) 40 MG tablet Take 1 tablet (40 mg total) by mouth daily. 06/08/21 07/08/21  Nolberto Hanlon, MD  ?sevelamer carbonate (RENVELA) 800 MG tablet Take 1,600 mg by mouth 2 (two) times daily with a meal.    [provider]  ?[place] X 8 MM [place]  08/28/18   [provider]  ? ? ?Allergies as of 07/01/2021 - Review Complete 07/01/2021  ?Allergen Reaction Noted  ? Succinylcholine Other  (See Comments) 04/12/2020  ? Penicillins Rash 10/31/2008  ? ? ?Family History  ?Problem Relation Age of Onset  ? Lung disease Sister   ? Diabetes Mellitus II Paternal Grandmother   ? Breast cancer Neg Hx   ? ? ?Social History  ? ?Socioeconomic History  ? Marital status: Married  ?  Spouse name: Not on file  ? Number of children: Not on file  ? Years of education: Not on file  ? Highest education level: Not on file  ?Occupational History  ? Not on file  ?Tobacco Use  ? Smoking status: Never  ? Smokeless tobacco: Never  ?Vaping Use  ? Vaping Use: Never used  ?Substance and Sexual Activity  ? Alcohol use: Never  ? Drug use: Never  ? Sexual activity: Not on file  ?Other Topics Concern  ? Not on file  ?  Social History Narrative  ? Not on file  ? ?Social Determinants of Health  ? ?Financial Resource Strain: Not on file  ?Food Insecurity: Not on file  ?Transportation Needs: Not on file  ?Physical Activity: Not on file  ?Stress: Not on file  ?Social Connections: Not on file  ?Intimate Partner Violence: Not on file  ? ? ?Review of Systems: ?See HPI, otherwise negative ROS ? ?Physical Exam: ?BP (!) 123/50   Pulse 86   Temp (!) 96.8 ?F (36 ?C) (Temporal)   Resp 20   Ht '5\' 3"'$  (1.6 m)   Wt 51.3 kg   SpO2 98%   BMI 20.02 kg/m?  ?General:   Alert,  pleasant and cooperative in NAD ?Head:  Normocephalic and atraumatic. ?Neck:  Supple; no masses or thyromegaly. ?Lungs:  Clear throughout to auscultation, normal respiratory effort.    ?Heart:  +S1, +S2, Regular rate and rhythm, No edema. ?Abdomen:  Soft, nontender and nondistended. Normal bowel sounds, without guarding, and without rebound.   ?Neurologic:  Alert and  oriented x4;  grossly normal neurologically. ? ?Impression/Plan: ?Aubriana Ravelo is here for an colonoscopy to be performed for rectal bleeding.  have been reviewed with the patient.  Questions have been answered.  All parties agreeable. ? ? ?[name], MD  07/02/2021, 9:17 AM ? ?

## 2021-07-02 NOTE — Anesthesia Preprocedure Evaluation (Signed)
Anesthesia Evaluation  ?Patient identified by MRN, date of birth, ID band ?Patient awake ? ?General Assessment Comment: ?Previous airway note (anesthetic I personally supervised): ? ?Anticipated difficult airway. Glidescope 4 blade utilized with Grade 3 view. Second attempt at laryngoscopy with Glidcescope 3 resulted in Grade 1 view with easy intubation. Atraumatic. ? ?Prior airway note at OSH before that: ? ?"1st attempt with Miller 3, rigid/fixed anterior larynx, unable to visualize anything but epiglottis. ?Glidescope to OR, great view with VL, 1st attempt to pass ETT unsuccessful due to anterior orientation. ETT out, greater bend placed on ETT and attempts to pass 2nd time successful with effort. " ? ?Hx severe myalgias after succinylcholine. Does not appear to be a malignant hyperthermia episode; patient says all she had was muscle aches, no life threatening reactions, and it seems she had inhalational anesthetics since then. ? ?Reviewed: ?Allergy & Precautions, NPO status , Patient's Chart, lab work & pertinent test results ? ?History of Anesthesia Complications ?(+) DIFFICULT AIRWAY and history of anesthetic complications ? ?Airway ?Mallampati: III ? ?TM Distance: >3 FB ?Neck ROM: Limited ? ? ? Dental ? ?(+) Poor Dentition, Implants ?  ?Pulmonary ?neg pulmonary ROS, neg sleep apnea, neg COPD, Patient abstained from smoking.Not current smoker,  ?  ?Pulmonary exam normal ?breath sounds clear to auscultation ? ? ? ? ? ? Cardiovascular ?Exercise Tolerance: Good ?METShypertension, Pt. on medications ?+CHF  ?(-) CAD and (-) Past MI (-) dysrhythmias + Valvular Problems/Murmurs AS  ?Rhythm:Regular Rate:Normal ?+ Systolic murmurs ?TTE 2022: ?IMPRESSIONS  ? ? ??1. Left ventricular ejection fraction, by estimation, is 50 to 55%. The  ?left ventricle has low normal function. The left ventricle has no regional  ?wall motion abnormalities. There is moderate left ventricular hypertrophy.   ?Left ventricular diastolic  ?parameters are consistent with Grade II diastolic dysfunction  ?(pseudonormalization).  ??2. Right ventricular systolic function is normal. The right ventricular  ?size is normal.  ??3. Left atrial size was mild to moderately dilated.  ??4. The mitral valve is normal in structure. Mild to moderate mitral valve  ?regurgitation.  ??5. The aortic valve is calcified. Aortic valve regurgitation is mild.  ?Mild to moderate aortic valve stenosis.  ?  ?Neuro/Psych ?TIA Neuromuscular disease negative psych ROS  ? GI/Hepatic ?GERD  ,(+)  ?  ? (-) substance abuse ? ,   ?Endo/Other  ?diabetes ? Renal/GU ?ESRF and DialysisRenal diseaseOn peritoneal dialysis , last dialyzed two days ago. Feels well, not fluid overloaded. Due for dialysis today  ? ?  ?Musculoskeletal ? ?(+) Arthritis ,  ? Abdominal ?  ?Peds ? Hematology ? ?(+) Blood dyscrasia, anemia ,   ?Anesthesia Other Findings ?Past Medical History: ?No date: Anemia of chronic renal failure ?07/02/2017: Aortic valve stenosis ?    Comment:  a.) TTE 07/02/2017: 55-60%; mild AS with MPG 13.0 mmHg.  ?             b.) TTE 10/14/2019: EF >55%; MPG 23.0 mmHg. c.) TTE  ?             06/23/2020: EF 50-55%; MPG 19.7 mmHg. d.) TTE 11/27/2020: ?             EF >55%; MPG 18.0 mmHg. ?No date: Chronic lower back pain ?No date: Complication of anesthesia ?    Comment:  a.) severe myalgias with succinylcholine use. b.) (+)  ?             difficult airway. ?50/11/3816: Diastolic dysfunction ?    Comment:  a.) TTE 07/02/2017; EF 55-60%; mild AR; G1DD. b.) TTE  ?             06/23/2020: EF 50-55% with mild LVH; mild to mod LA  ?             enlargement; mild AR, mild to mod MR; G2DD. ?No date: Difficult airway for intubation ?    Comment:  a.) 08/08/2020 at [facility] --> Initial attempt with Sabra Heck  ?             3, larynx was "rigid/fixed"; unable to visualize anything ?             but epiglottis. Glidescope utilized to obtain a great  ?             view with VL.  First attempt with Glidescope unsuccessful  ?             d/t anterior orientation. ETT withdrawn and a greater  ?             bend was placed. Second attempt was successful with  ?             effort. Patient sustained lip laceration during  ?             intubation attempts. ?11/03/2019: Dilatation of thoracic aorta ([place]) ?    Comment:  a.) TTE 11/03/2019: measured 3.7 cm ?No date: ESRD on peritoneal dialysis [facility]) ?No date: GERD (gastroesophageal reflux disease) ?No date: Glaucoma ?No date: Heart murmur ?No date: HLD (hyperlipidemia) ?No date: Hyperparathyroidism ([place]) ?    Comment:  a.) s/p total parathyroidectomy ?No date: Hypertension ?No date: Hypertensive retinopathy ?No date: Lumbar radiculopathy ?No date: MGUS (monoclonal gammopathy of unknown significance) ?No date: Motion sickness ?No date: Nonproliferative diabetic retinopathy ([place]) ?No date: Osteoarthritis ?No date: Peritoneal dialysis catheter in place [facility]) ?No date: Personal history of chemotherapy ?No date: Personal history of radiation therapy ?2011: Rectal cancer ([place]) ?    Comment:  a.) Clinical stage T3N0. b.) s/p 3 cycles of neoadjuvant ?             CI 5FU + XRT c.) s/p resection on 08/08/2009. ?No date: Spinal stenosis of lumbar region with neurogenic claudication ?No date: T2DM (type 2 diabetes mellitus) ([place]) ?No date: TIA (transient ischemic attack) ? Reproductive/Obstetrics ? ?  ? ? ? ? ? ? ? ? ? ? ? ? ? ?  ?  ? ? ? ? ? ? ? ? ?Anesthesia Physical ? ?Anesthesia Plan ? ?ASA: 3 ? ?Anesthesia Plan: General  ? ?Post-op Pain Management: Minimal or no pain anticipated  ? ?Induction: Intravenous ? ?PONV Risk Score and Plan: 3 and Ondansetron, Treatment may vary due to age or medical condition, Propofol infusion and TIVA ? ?Airway Management Planned: Natural Airway ? ?Additional Equipment: Arterial line ? ?Intra-op Plan:  ? ?Post-operative Plan:  ? ?Informed Consent: I have reviewed the patients History and Physical, chart, labs and discussed the  procedure including the risks, benefits and alternatives for the proposed anesthesia with the patient or authorized representative who has indicated his/her understanding and acceptance.  ? ? ? ?Dental advisory given ? ?Plan Discussed with: CRNA and Surgeon ? ?Anesthesia Plan Comments: (Discussed risks of anesthesia with patient, including PONV, sore throat, lip/dental/eye damage. Rare risks discussed as well, such as cardiorespiratory and neurological sequelae, and allergic reactions. Discussed the role of CRNA in patient's perioperative care. Patient understands.)  ? ? ? ? ? ? ?Anesthesia Quick  Evaluation ? ?

## 2021-07-03 ENCOUNTER — Encounter: Payer: Self-pay | Admitting: Gastroenterology

## 2021-07-03 LAB — SURGICAL PATHOLOGY

## 2021-07-11 ENCOUNTER — Other Ambulatory Visit: Payer: Self-pay | Admitting: Physician Assistant

## 2021-07-11 DIAGNOSIS — R413 Other amnesia: Secondary | ICD-10-CM

## 2021-07-18 ENCOUNTER — Other Ambulatory Visit: Payer: Self-pay

## 2021-07-18 ENCOUNTER — Encounter: Payer: Self-pay | Admitting: Gastroenterology

## 2021-07-18 ENCOUNTER — Telehealth: Payer: Medicare Other | Admitting: Gastroenterology

## 2021-07-18 NOTE — Progress Notes (Incomplete)
?  ?[name] , MD ?[address]  ?[address]  ?[place], [place] [address]  ?Main: [phone]  ?Fax: [phone] ? ? ?Primary Care Physician: Gayland Curry, MD ? ?Virtual Visit via Video Note ? ?I connected with patient on 07/18/21 at  1:45 PM EDT by video and verified that I am speaking with the correct person using two identifiers. ?  ?I discussed the limitations, risks, security and privacy concerns of performing an evaluation and management service by video  and the availability of in person appointments. I also discussed with the patient that there may be a patient responsible charge related to this service. The patient expressed understanding and agreed to proceed. ? ?Location of Patient: Home ?Location of Provider: Home ?Persons involved: Patient and provider only ? ? ?History of Present Illness: ?No chief complaint on file. ? ? ?HPI: Misty [name] is a 81 y.o. female ? ?The patient is here for hospital follow-up.  She presented to the hospital on 06/05/2021 with a GI bleed.  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  Presented to the ER with rectal bleeding multiple bowel movements with clots.  The hemoglobin was 1 to have a hemoglobin of 7 g which was 10.9 g 4 months prior.  She she underwent a CT angiogram which showed bleeding at the anastomosis underwent embolization.  Subsequently I performed a colonoscopy on 07/02/2021 and found a 5 mm polyp in the descending colon ,  4 sessile polyps in the ascending colon removed with a cold snare.  No evidence of active bleeding was noted. ? ? ? ? ? ?Current Outpatient Medications  ?Medication Sig Dispense Refill  ? acetaminophen (TYLENOL) 325 MG tablet Take 650 mg by mouth every 4 (four) hours as needed for moderate pain.    ? carvedilol (COREG) 12.5 MG tablet Take 12.5 mg by mouth 2 (two) times  daily with a meal. (Patient not taking: Reported on 07/01/2021)    ? cetirizine (ZYRTEC) 10 MG tablet Take 10 mg by mouth every morning.    ? Cholecalciferol (VITAMIN D) 50 MCG (2000 UT) tablet Take 2,000 Units by mouth every Monday, Wednesday, and Friday.    ? colestipol (COLESTID) 1 g tablet Take 2 g by mouth daily.    ? Fiber Diet TABS Take 2 tablets by mouth 2 (two) times daily.    ? gabapentin (NEURONTIN) 300 MG capsule Take 1 capsule (300 mg total) by mouth at bedtime. (Patient taking differently: Take 300 mg by mouth 2 (two) times daily.) 30 capsule 1  ? glucose blood test strip 1 each by Finger Stick route 4 times daily (after meals and nightly). Use as directed    ? HUMALOG 100 UNIT/ML injection Inject 4-12 Units into the skin 2 (two) times daily with a meal.    ? hydrocortisone (ANUSOL-HC) 2.5 % rectal cream Apply 1 application. topically daily as needed. (Patient not taking: Reported on 07/01/2021)    ? Hydrocortisone Acetate 2.5 % CREA Apply topically.    ? INSULIN SYRINGE 1CC/29G 29G X 1/2" 1 ML MISC     ? LANTUS SOLOSTAR 100 UNIT/ML Solostar Pen Inject 10 Units into the skin at bedtime.    ? lidocaine (LIDODERM) 5 % Place 1 patch onto the skin daily as needed (pain).    ? methocarbamol (ROBAXIN) 500 MG tablet Take 1 tablet (500 mg total) by  mouth every 8 (eight) hours as needed for muscle spasms. (Patient not taking: Reported on 07/01/2021) 90 tablet 0  ? multivitamin (RENA-VIT) TABS tablet Take 1 tablet by mouth every morning.    ? ondansetron (ZOFRAN) 4 MG tablet Take 1 tablet (4 MG) before prep starts. Take 3 tablets (9 MG) as needed 6 hours after for nausea. 4 tablet 0  ? pantoprazole (PROTONIX) 40 MG tablet Take 1 tablet (40 mg total) by mouth daily. 30 tablet 0  ? sevelamer carbonate (RENVELA) 800 MG tablet Take 1,600 mg by mouth 2 (two) times daily with a meal.    ? SURE COMFORT PEN NEEDLES 31G X 8 MM MISC     ? ?No current facility-administered medications for this visit.  ? ? ?Allergies as of  07/18/2021 - Review Complete 07/02/2021  ?Allergen Reaction Noted  ? Succinylcholine Other (See Comments) 04/12/2020  ? Penicillins Rash 10/31/2008  ? ? ?Review of Systems:    ?All systems reviewed and negative except where noted in HPI.  ?General Appearance:    Alert, cooperative, no distress, appears stated age  ?Head:    Normocephalic, without obvious abnormality, atraumatic  ?Eyes:    PERRL, conjunctiva/corneas clear,  ?Ears:    Grossly normal hearing   ? ?Neurologic:  Grossly normal   ? ?Observations/Objective: ? ?Labs: ?CMP  ?   ?Component Value Date/Time  ? NA 132 (L) 06/07/2021 1020  ? K 4.2 06/07/2021 1020  ? CL 99 06/07/2021 1020  ? CO2 22 06/07/2021 1020  ? GLUCOSE 197 (H) 06/07/2021 1020  ? BUN 38 (H) 06/07/2021 1020  ? CREATININE 6.71 (H) 06/07/2021 1020  ? CALCIUM 7.7 (L) 06/07/2021 1020  ? PROT 6.2 (L) 06/04/2021 1630  ? ALBUMIN 2.8 (L) 06/04/2021 1630  ? AST 18 06/04/2021 1630  ? ALT 10 06/04/2021 1630  ? ALKPHOS 43 06/04/2021 1630  ? BILITOT 0.4 06/04/2021 1630  ? GFRNONAA 6 (L) 06/07/2021 1020  ? GFRAA 13 (L) 08/11/2019 1405  ? ?Lab Results  ?Component Value Date  ? WBC 10.9 (H) 06/27/2021  ? HGB 9.6 (L) 06/27/2021  ? HCT 29.2 (L) 06/27/2021  ? MCV 92 06/27/2021  ? PLT 291 06/27/2021  ? ? ?Imaging Studies: ?No results found. ? ?Assessment and Plan:  ? ?Misty [name] is a 81 y.o. y/o female here to see me for follow-up of her lower GI bleed.  In April 2023 admitted with a history of rectal bleeding underwent embolization for the lesion bleeding at the anastomosis near the rectum.  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 ? ? ? ? ? ?  ?I discussed the assessment and treatment plan with the patient. The patient was provided an opportunity to ask questions and all were answered. The patient agreed with the plan and demonstrated an understanding of the instructions. ?  ?The patient was advised to call back or seek an in-person evaluation if the  symptoms worsen or if the condition fails to improve as anticipated. ? ?I provided *** minutes of face-to-face time during this encounter. ? ?Dr [name] MD,MRCP [facility]) ?Gastroenterology/Hepatology ?Pager: [phone] ? ? ?Speech recognition software was used to dictate this note.   ?

## 2021-07-26 ENCOUNTER — Ambulatory Visit
Admission: RE | Admit: 2021-07-26 | Discharge: 2021-07-26 | Disposition: A | Discharge status: Home / Self Care | Payer: Medicare Other | Source: Ambulatory Visit | Attending: Physician Assistant | Admitting: Physician Assistant

## 2021-07-26 DIAGNOSIS — R413 Other amnesia: Secondary | ICD-10-CM | POA: Insufficient documentation

## 2021-07-26 IMAGING — MR MR HEAD W/O CM
13 series · 48 of 48 positions shown · non-contrast
Comparison: Head CT [DATE].

CLINICAL DATA: Provided history: Memory loss or impairment.
Additional history provided by scanning technologist: Body tremors
for 3 months, memory difficulty, multiple recent falls due to
instability.

EXAM:
MRI HEAD WITHOUT CONTRAST
TECHNIQUE: Multiplanar, multiecho pulse sequences of the brain and surrounding
structures were obtained without intravenous contrast.

[Series 5: ax dwi_tracew · axial · 3.0mm · 0.65mm/px · z∈[-122,+19]mm · 3 of 44 slices shown]
[im 1/44]
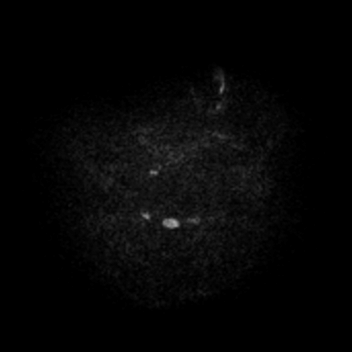
[im 22/44]
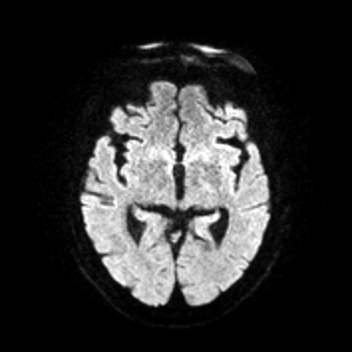
[im 44/44]
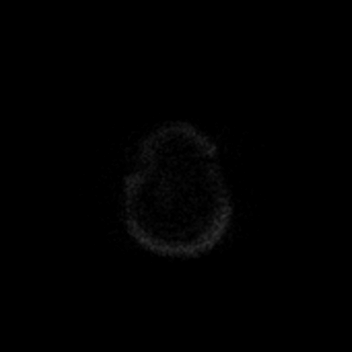

[Series 6: ax dwi_adc · axial · 3.0mm · 0.65mm/px · z∈[-122,+12]mm · 3 of 42 slices shown]
[im 1/42]
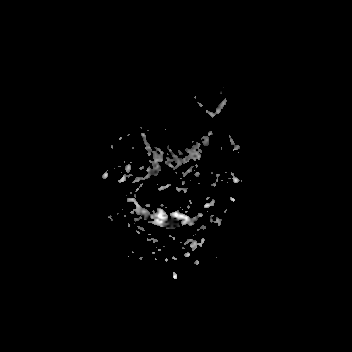
[im 21/42]
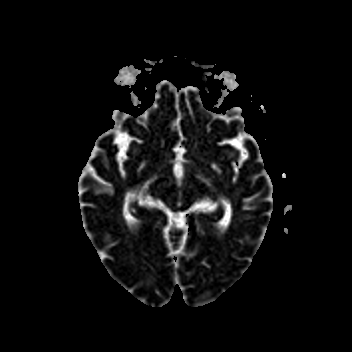
[im 42/42]
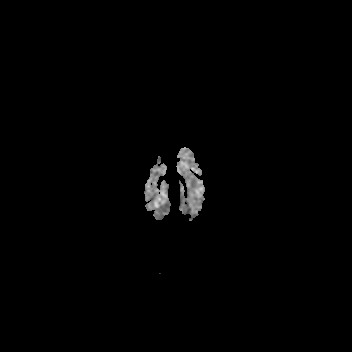

[Series 7: cor dwi_tracew · coronal · 5.0mm · 0.60mm/px · 2 of 34 slices shown]
[im 1/34]
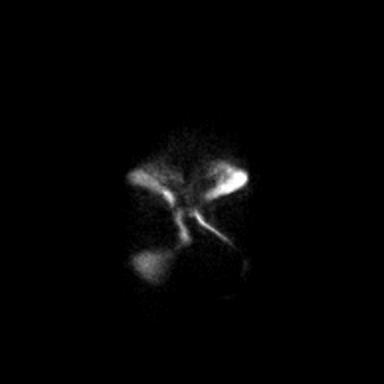
[im 34/34]
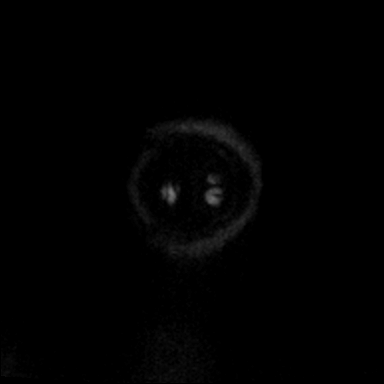

[Series 8: cor dwi_adc · coronal · 5.0mm · 0.60mm/px · 2 of 32 slices shown]
[im 1/32]
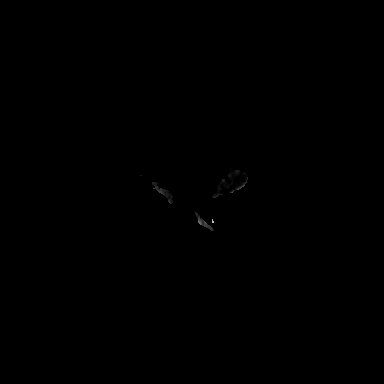
[im 32/32]
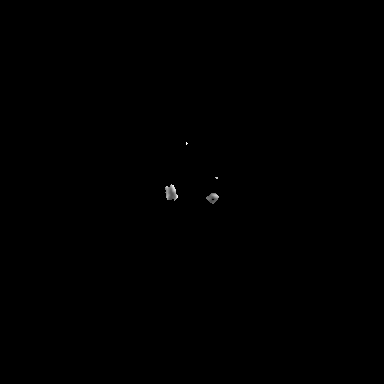

[Series 9: T1 · sagittal · 5.0mm · 0.62mm/px · 1 of 22 slices shown (1 of 2)]
[im 1/22]
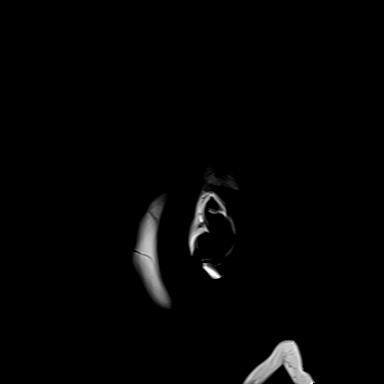

[Series 10: T2 · axial · 5.0mm · 0.53mm/px · z∈[-123,+20]mm · 2 of 25 slices shown (1 of 2)]
[im 1/25]
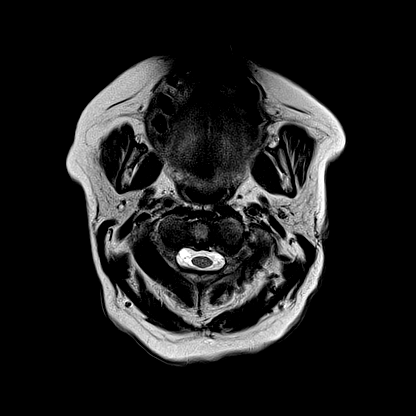
[im 25/25]
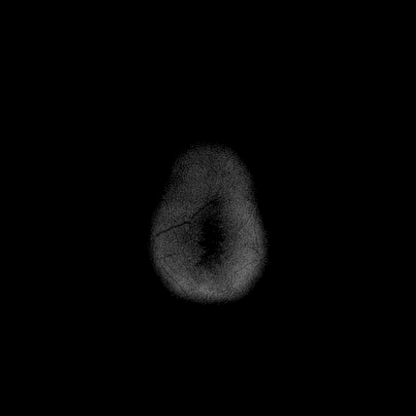

[Series 11: ax swi_mag · axial · 2.0mm · 0.90mm/px · z∈[-129,+27]mm · 5 of 80 slices shown]
[im 1/80]
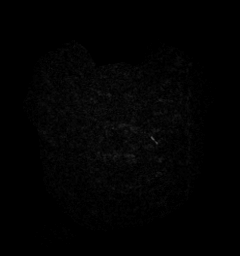
[im 20/80]
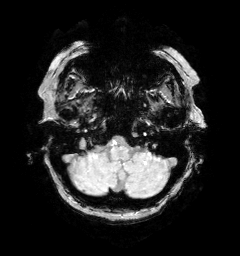
[im 40/80]
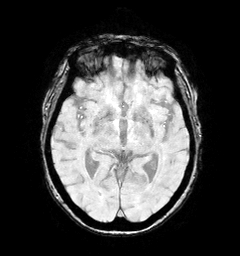
[im 60/80]
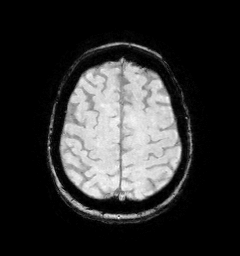
[im 80/80]
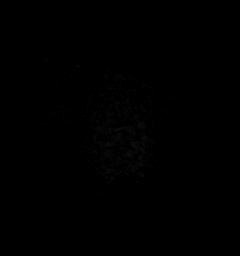

[Series 12: ax swi_pha · axial · 2.0mm · 0.90mm/px · z∈[-129,+27]mm · 5 of 80 slices shown]
[im 1/80]
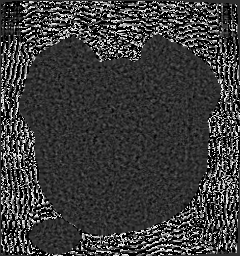
[im 20/80]
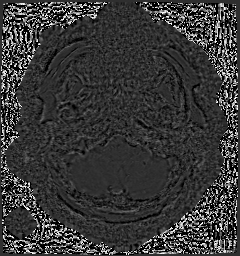
[im 40/80]
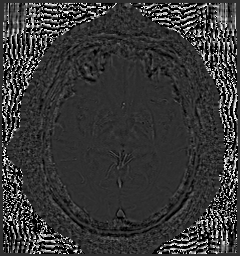
[im 60/80]
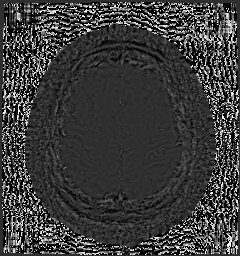
[im 80/80]
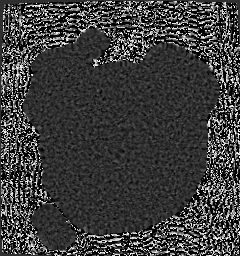

[Series 13: ax swi_swi · axial · 2.0mm · 0.90mm/px · z∈[-129,+27]mm · 5 of 80 slices shown]
[im 1/80]
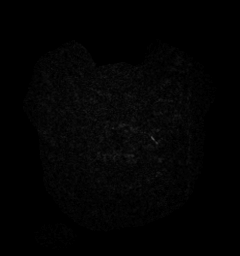
[im 20/80]
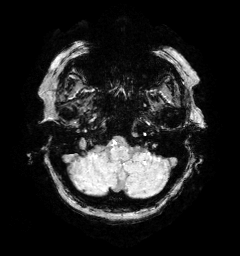
[im 40/80]
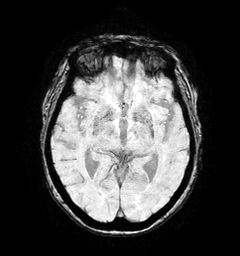
[im 60/80]
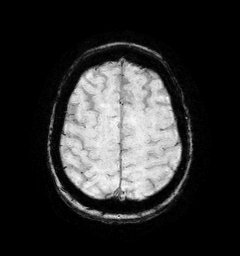
[im 80/80]
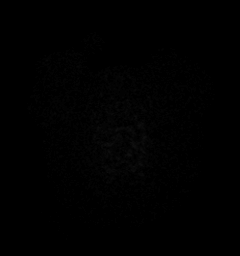

[Series 14: ax swi_swi_mip · axial · 16.0mm · 0.90mm/px · z∈[-123,+20]mm · 5 of 73 slices shown]
[im 1/73]
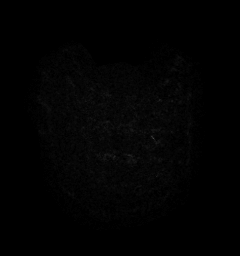
[im 19/73]
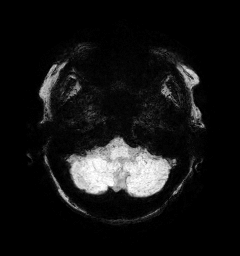
[im 37/73]
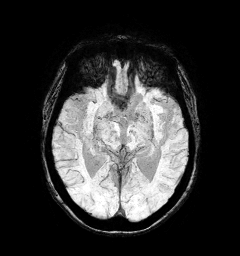
[im 55/73]
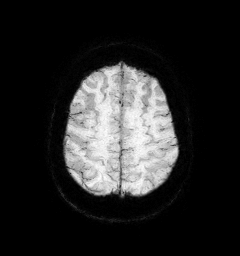
[im 73/73]
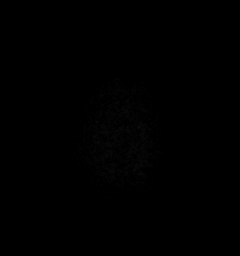

[Series 15: FLAIR · axial · 3.0mm · 0.53mm/px · z∈[-131,+29]mm · 4 of 55 slices shown]
[im 1/55]
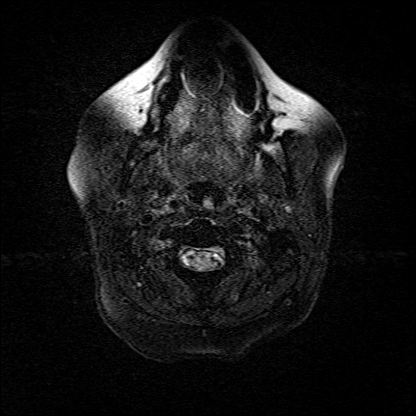
[im 19/55]
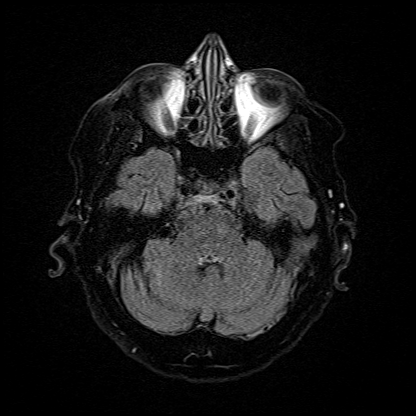
[im 37/55]
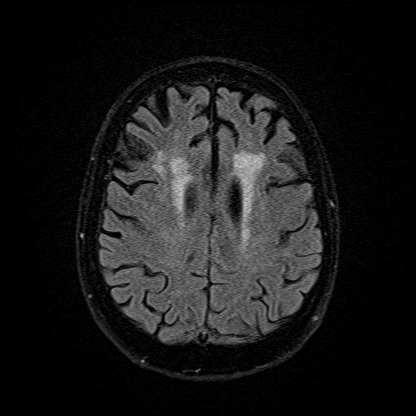
[im 55/55]
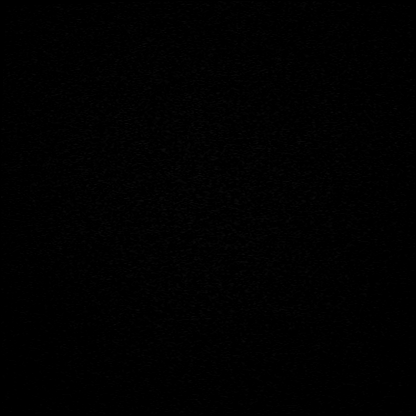

[Series 16: T1 · axial · 1.0mm · 0.98mm/px · z∈[-123,+19]mm · 9 of 144 slices shown (2 of 2)]
[im 1/144]
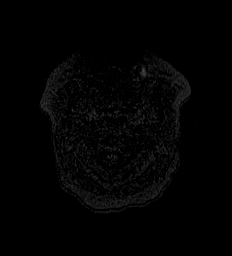
[im 18/144]
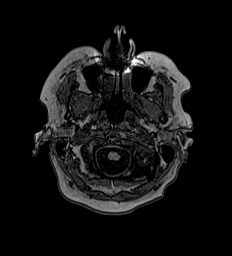
[im 36/144]
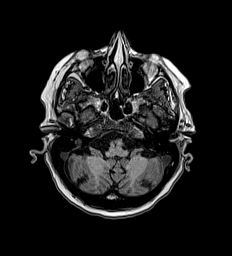
[im 54/144]
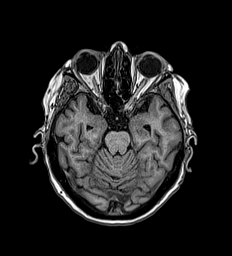
[im 72/144]
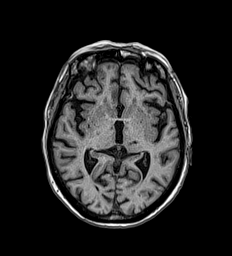
[im 90/144]
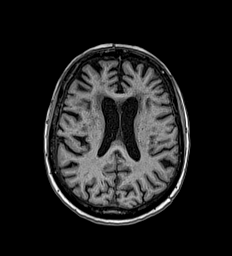
[im 108/144]
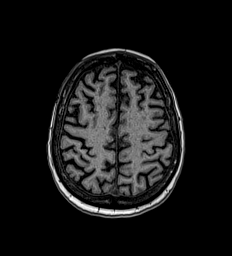
[im 126/144]
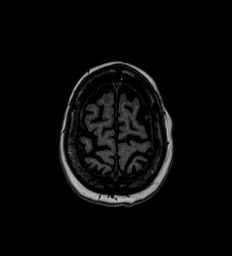
[im 144/144]
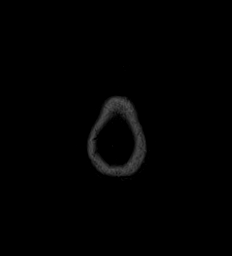

[Series 17: T2 · coronal · 5.0mm · 0.57mm/px · 2 of 29 slices shown (2 of 2)]
[im 1/29]
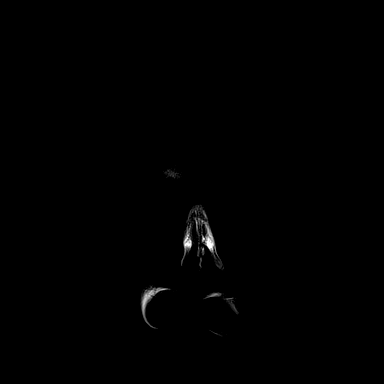
[im 29/29]
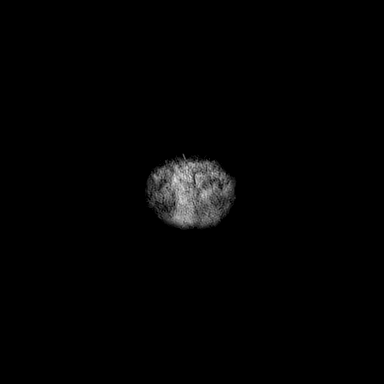

[48 of 48 positions shown; findings below may reference images not displayed]

FINDINGS: Brain:

Mild generalized parenchymal atrophy.

Chronic lacunar infarcts within the callosal genu, right lentiform
nucleus and bilateral thalami.

Mild to moderate multifocal T2 FLAIR hyperintense signal abnormality
within the cerebral white matter and pons, nonspecific but
compatible with chronic small vessel ischemic disease.

There is no acute infarct.

No evidence of an intracranial mass.

No chronic intracranial blood products.

No extra-axial fluid collection.

No midline shift.

Vascular: Maintained flow voids within the proximal large arterial
vessels.

Skull and upper cervical spine: No focal suspicious marrow lesion.
Incompletely assessed cervical spondylosis.

Sinuses/Orbits: No mass or acute finding within the imaged orbits.
Prior bilateral ocular lens replacement. Minimal mucosal thickening
within the bilateral ethmoid sinuses.

Other: Small-volume fluid within the right mastoid air cells.
IMPRESSION: No evidence of acute intracranial abnormality.

Chronic lacunar infarcts within the callosal genu, right lentiform
nucleus and bilateral thalami.

Mild-to-moderate chronic small vessel ischemic changes within the
cerebral white matter and pons.

Mild generalized parenchymal atrophy

Minimal mucosal thickening within the bilateral ethmoid sinuses.

Small-volume fluid within the right mastoid air cells.

## 2021-08-01 ENCOUNTER — Emergency Department: Payer: Medicare Other

## 2021-08-01 ENCOUNTER — Inpatient Hospital Stay
Admission: EM | Admit: 2021-08-01 | Discharge: 2021-08-04 | DRG: 871 | Disposition: A | Discharge status: Hospice | Payer: Medicare Other | Attending: Pulmonary Disease | Admitting: Pulmonary Disease

## 2021-08-01 ENCOUNTER — Other Ambulatory Visit: Payer: Self-pay

## 2021-08-01 DIAGNOSIS — E872 Acidosis, unspecified: Secondary | ICD-10-CM | POA: Diagnosis present

## 2021-08-01 DIAGNOSIS — J9600 Acute respiratory failure, unspecified whether with hypoxia or hypercapnia: Secondary | ICD-10-CM | POA: Diagnosis present

## 2021-08-01 DIAGNOSIS — G8929 Other chronic pain: Secondary | ICD-10-CM | POA: Diagnosis present

## 2021-08-01 DIAGNOSIS — N2581 Secondary hyperparathyroidism of renal origin: Secondary | ICD-10-CM | POA: Diagnosis present

## 2021-08-01 DIAGNOSIS — I6381 Other cerebral infarction due to occlusion or stenosis of small artery: Secondary | ICD-10-CM | POA: Diagnosis not present

## 2021-08-01 DIAGNOSIS — T85611A Breakdown (mechanical) of intraperitoneal dialysis catheter, initial encounter: Secondary | ICD-10-CM | POA: Diagnosis not present

## 2021-08-01 DIAGNOSIS — J9601 Acute respiratory failure with hypoxia: Secondary | ICD-10-CM | POA: Diagnosis present

## 2021-08-01 DIAGNOSIS — Z8673 Personal history of transient ischemic attack (TIA), and cerebral infarction without residual deficits: Secondary | ICD-10-CM

## 2021-08-01 DIAGNOSIS — I10 Essential (primary) hypertension: Secondary | ICD-10-CM | POA: Diagnosis present

## 2021-08-01 DIAGNOSIS — E1101 Type 2 diabetes mellitus with hyperosmolarity with coma: Secondary | ICD-10-CM | POA: Diagnosis present

## 2021-08-01 DIAGNOSIS — R4182 Altered mental status, unspecified: Secondary | ICD-10-CM | POA: Diagnosis not present

## 2021-08-01 DIAGNOSIS — C9000 Multiple myeloma not having achieved remission: Secondary | ICD-10-CM | POA: Diagnosis present

## 2021-08-01 DIAGNOSIS — Z9221 Personal history of antineoplastic chemotherapy: Secondary | ICD-10-CM

## 2021-08-01 DIAGNOSIS — Z66 Do not resuscitate: Secondary | ICD-10-CM | POA: Diagnosis not present

## 2021-08-01 DIAGNOSIS — I5033 Acute on chronic diastolic (congestive) heart failure: Secondary | ICD-10-CM | POA: Diagnosis present

## 2021-08-01 DIAGNOSIS — K810 Acute cholecystitis: Secondary | ICD-10-CM | POA: Diagnosis present

## 2021-08-01 DIAGNOSIS — Y838 Other surgical procedures as the cause of abnormal reaction of the patient, or of later complication, without mention of misadventure at the time of the procedure: Secondary | ICD-10-CM | POA: Diagnosis not present

## 2021-08-01 DIAGNOSIS — E1122 Type 2 diabetes mellitus with diabetic chronic kidney disease: Secondary | ICD-10-CM | POA: Diagnosis present

## 2021-08-01 DIAGNOSIS — D631 Anemia in chronic kidney disease: Secondary | ICD-10-CM | POA: Diagnosis present

## 2021-08-01 DIAGNOSIS — Z20822 Contact with and (suspected) exposure to covid-19: Secondary | ICD-10-CM | POA: Diagnosis present

## 2021-08-01 DIAGNOSIS — K219 Gastro-esophageal reflux disease without esophagitis: Secondary | ICD-10-CM | POA: Diagnosis present

## 2021-08-01 DIAGNOSIS — N186 End stage renal disease: Secondary | ICD-10-CM | POA: Diagnosis present

## 2021-08-01 DIAGNOSIS — E892 Postprocedural hypoparathyroidism: Secondary | ICD-10-CM | POA: Diagnosis present

## 2021-08-01 DIAGNOSIS — E119 Type 2 diabetes mellitus without complications: Secondary | ICD-10-CM

## 2021-08-01 DIAGNOSIS — Z992 Dependence on renal dialysis: Secondary | ICD-10-CM

## 2021-08-01 DIAGNOSIS — I132 Hypertensive heart and chronic kidney disease with heart failure and with stage 5 chronic kidney disease, or end stage renal disease: Secondary | ICD-10-CM | POA: Diagnosis present

## 2021-08-01 DIAGNOSIS — K859 Acute pancreatitis without necrosis or infection, unspecified: Secondary | ICD-10-CM

## 2021-08-01 DIAGNOSIS — R109 Unspecified abdominal pain: Secondary | ICD-10-CM | POA: Diagnosis present

## 2021-08-01 DIAGNOSIS — Z880 Allergy status to penicillin: Secondary | ICD-10-CM

## 2021-08-01 DIAGNOSIS — H409 Unspecified glaucoma: Secondary | ICD-10-CM | POA: Diagnosis present

## 2021-08-01 DIAGNOSIS — R296 Repeated falls: Secondary | ICD-10-CM | POA: Diagnosis present

## 2021-08-01 DIAGNOSIS — E1151 Type 2 diabetes mellitus with diabetic peripheral angiopathy without gangrene: Secondary | ICD-10-CM | POA: Diagnosis present

## 2021-08-01 DIAGNOSIS — E113213 Type 2 diabetes mellitus with mild nonproliferative diabetic retinopathy with macular edema, bilateral: Secondary | ICD-10-CM | POA: Diagnosis present

## 2021-08-01 DIAGNOSIS — D472 Monoclonal gammopathy: Secondary | ICD-10-CM | POA: Diagnosis present

## 2021-08-01 DIAGNOSIS — R0603 Acute respiratory distress: Secondary | ICD-10-CM | POA: Diagnosis present

## 2021-08-01 DIAGNOSIS — M545 Low back pain, unspecified: Secondary | ICD-10-CM | POA: Diagnosis present

## 2021-08-01 DIAGNOSIS — Z79899 Other long term (current) drug therapy: Secondary | ICD-10-CM

## 2021-08-01 DIAGNOSIS — Z794 Long term (current) use of insulin: Secondary | ICD-10-CM

## 2021-08-01 DIAGNOSIS — G9341 Metabolic encephalopathy: Secondary | ICD-10-CM | POA: Diagnosis present

## 2021-08-01 DIAGNOSIS — E860 Dehydration: Secondary | ICD-10-CM | POA: Diagnosis present

## 2021-08-01 DIAGNOSIS — R251 Tremor, unspecified: Secondary | ICD-10-CM | POA: Diagnosis not present

## 2021-08-01 DIAGNOSIS — I350 Nonrheumatic aortic (valve) stenosis: Secondary | ICD-10-CM | POA: Diagnosis present

## 2021-08-01 DIAGNOSIS — Z923 Personal history of irradiation: Secondary | ICD-10-CM

## 2021-08-01 DIAGNOSIS — R6521 Severe sepsis with septic shock: Secondary | ICD-10-CM | POA: Diagnosis present

## 2021-08-01 DIAGNOSIS — J189 Pneumonia, unspecified organism: Secondary | ICD-10-CM | POA: Diagnosis present

## 2021-08-01 DIAGNOSIS — R6520 Severe sepsis without septic shock: Secondary | ICD-10-CM | POA: Diagnosis present

## 2021-08-01 DIAGNOSIS — Z515 Encounter for palliative care: Secondary | ICD-10-CM

## 2021-08-01 DIAGNOSIS — E785 Hyperlipidemia, unspecified: Secondary | ICD-10-CM | POA: Diagnosis present

## 2021-08-01 DIAGNOSIS — R1011 Right upper quadrant pain: Secondary | ICD-10-CM

## 2021-08-01 DIAGNOSIS — E1165 Type 2 diabetes mellitus with hyperglycemia: Secondary | ICD-10-CM | POA: Diagnosis present

## 2021-08-01 DIAGNOSIS — R29731 NIHSS score 31: Secondary | ICD-10-CM | POA: Diagnosis not present

## 2021-08-01 DIAGNOSIS — A419 Sepsis, unspecified organism: Secondary | ICD-10-CM | POA: Diagnosis present

## 2021-08-01 DIAGNOSIS — I639 Cerebral infarction, unspecified: Secondary | ICD-10-CM

## 2021-08-01 DIAGNOSIS — G9601 Cranial cerebrospinal fluid leak, spontaneous: Secondary | ICD-10-CM | POA: Diagnosis not present

## 2021-08-01 DIAGNOSIS — Z888 Allergy status to other drugs, medicaments and biological substances status: Secondary | ICD-10-CM

## 2021-08-01 DIAGNOSIS — Z85048 Personal history of other malignant neoplasm of rectum, rectosigmoid junction, and anus: Secondary | ICD-10-CM

## 2021-08-01 LAB — CBG MONITORING, ED
Glucose-Capillary: 584 mg/dL (ref 70–99)
Glucose-Capillary: 590 mg/dL (ref 70–99)
Glucose-Capillary: 533 mg/dL (ref 70–99)

## 2021-08-01 LAB — LIPID PANEL
Cholesterol: 270 mg/dL — ABNORMAL HIGH (ref 0–200)
HDL: 35 mg/dL — ABNORMAL LOW (ref >40)
LDL Cholesterol: 164 mg/dL — ABNORMAL HIGH (ref 0–99)
Total CHOL/HDL Ratio: 7.7 RATIO
Triglycerides: 355 mg/dL — ABNORMAL HIGH (ref <150)
VLDL: 71 mg/dL — ABNORMAL HIGH (ref 0–40)

## 2021-08-01 LAB — CBC WITH DIFFERENTIAL/PLATELET
Abs Immature Granulocytes: 0.12 10*3/uL — ABNORMAL HIGH (ref 0.00–0.07)
Basophils Absolute: 0 10*3/uL (ref 0.0–0.1)
Basophils Relative: 0 %
Eosinophils Absolute: 0 10*3/uL (ref 0.0–0.5)
Eosinophils Relative: 0 %
HCT: 39.9 % (ref 36.0–46.0)
Hemoglobin: 12.6 g/dL (ref 12.0–15.0)
Immature Granulocytes: 1 %
Lymphocytes Relative: 4 %
Lymphs Abs: 0.6 10*3/uL — ABNORMAL LOW (ref 0.7–4.0)
MCH: 30.1 pg (ref 26.0–34.0)
MCHC: 31.6 g/dL (ref 30.0–36.0)
MCV: 95.5 fL (ref 80.0–100.0)
Monocytes Absolute: 0.6 10*3/uL (ref 0.1–1.0)
Monocytes Relative: 4 %
Neutro Abs: 14.6 10*3/uL — ABNORMAL HIGH (ref 1.7–7.7)
Neutrophils Relative %: 91 %
Platelets: 422 10*3/uL — ABNORMAL HIGH (ref 150–400)
RBC: 4.18 MIL/uL (ref 3.87–5.11)
RDW: 16.2 % — ABNORMAL HIGH (ref 11.5–15.5)
WBC: 15.9 10*3/uL — ABNORMAL HIGH (ref 4.0–10.5)
nRBC: 0 % (ref 0.0–0.2)

## 2021-08-01 LAB — URINALYSIS, ROUTINE W REFLEX MICROSCOPIC
Bilirubin Urine: NEGATIVE
Glucose, UA: >=500 mg/dL — AB
Hgb urine dipstick: NEGATIVE
Ketones, ur: 5 mg/dL — AB
Leukocytes,Ua: NEGATIVE
Nitrite: NEGATIVE
Protein, ur: >=300 mg/dL — AB
Specific Gravity, Urine: 1.023 (ref 1.005–1.030)
pH: 6 (ref 5.0–8.0)

## 2021-08-01 LAB — COMPREHENSIVE METABOLIC PANEL
ALT: 15 U/L (ref 0–44)
AST: 38 U/L (ref 15–41)
Albumin: 2.4 g/dL — ABNORMAL LOW (ref 3.5–5.0)
Alkaline Phosphatase: 107 U/L (ref 38–126)
Anion gap: 19 — ABNORMAL HIGH (ref 5–15)
BUN: 33 mg/dL — ABNORMAL HIGH (ref 8–23)
CO2: 18 mmol/L — ABNORMAL LOW (ref 22–32)
Calcium: 9.4 mg/dL (ref 8.9–10.3)
Chloride: 97 mmol/L — ABNORMAL LOW (ref 98–111)
Creatinine, Ser: 4.02 mg/dL — ABNORMAL HIGH (ref 0.44–1.00)
GFR, Estimated: 11 mL/min — ABNORMAL LOW (ref >60)
Glucose, Bld: 662 mg/dL (ref 70–99)
Potassium: 3.5 mmol/L (ref 3.5–5.1)
Sodium: 134 mmol/L — ABNORMAL LOW (ref 135–145)
Total Bilirubin: 0.9 mg/dL (ref 0.3–1.2)
Total Protein: 6.5 g/dL (ref 6.5–8.1)

## 2021-08-01 LAB — TRIGLYCERIDES: Triglycerides: 359 mg/dL — ABNORMAL HIGH (ref <150)

## 2021-08-01 LAB — BODY FLUID CELL COUNT WITH DIFFERENTIAL
Eos, Fluid: 0 %
Lymphs, Fluid: 21 %
Monocyte-Macrophage-Serous Fluid: 79 %
Neutrophil Count, Fluid: 0 %
Total Nucleated Cell Count, Fluid: 20 cu mm

## 2021-08-01 LAB — RESP PANEL BY RT-PCR (FLU A&B, COVID) ARPGX2
Influenza A by PCR: NEGATIVE
Influenza B by PCR: NEGATIVE
SARS Coronavirus 2 by RT PCR: NEGATIVE

## 2021-08-01 LAB — LACTIC ACID, PLASMA
Lactic Acid, Venous: 4.8 mmol/L (ref 0.5–1.9)
Lactic Acid, Venous: 5.1 mmol/L (ref 0.5–1.9)

## 2021-08-01 LAB — LIPASE, BLOOD: Lipase: 361 U/L — ABNORMAL HIGH (ref 11–51)

## 2021-08-01 LAB — GLUCOSE, CAPILLARY: Glucose-Capillary: 487 mg/dL — ABNORMAL HIGH (ref 70–99)

## 2021-08-01 LAB — PROTIME-INR
INR: 1.1 (ref 0.8–1.2)
Prothrombin Time: 13.8 seconds (ref 11.4–15.2)

## 2021-08-01 IMAGING — CT CT HEAD W/O CM
4 series · 16 of 47 positions shown, 18 images · non-contrast
Comparison: MRI [DATE].  CT [DATE]

CLINICAL DATA: Mental status change of unknown cause beginning 2
days ago. Vomiting.



[Series 2: head wo · axial · 0.41mm/px · z∈[-88,+18]mm · 7 of 29 slices shown, 9 images]
[im 4/29  brain]
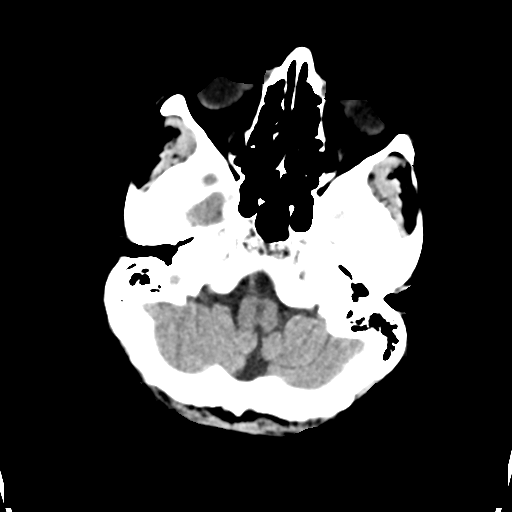
[im 4/29  bone]
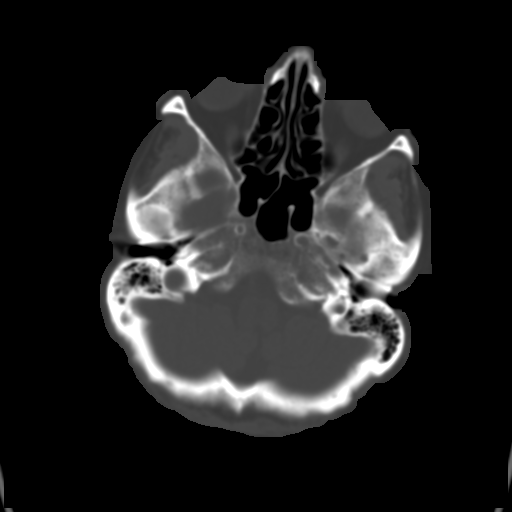
[im 8/29  brain]
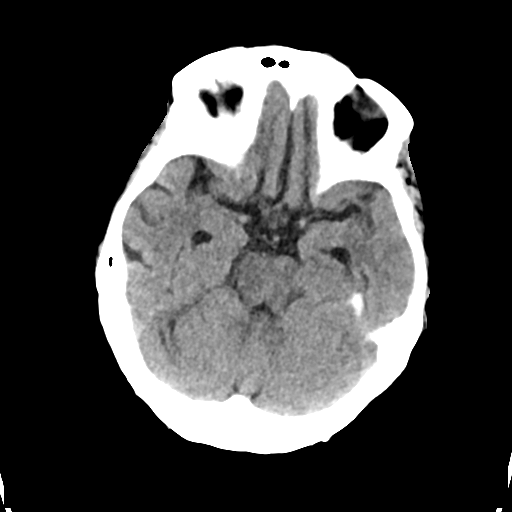
[im 11/29  brain]
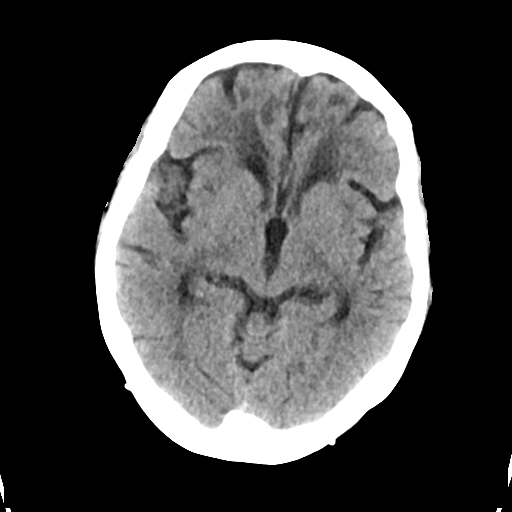
[im 15/29  brain]
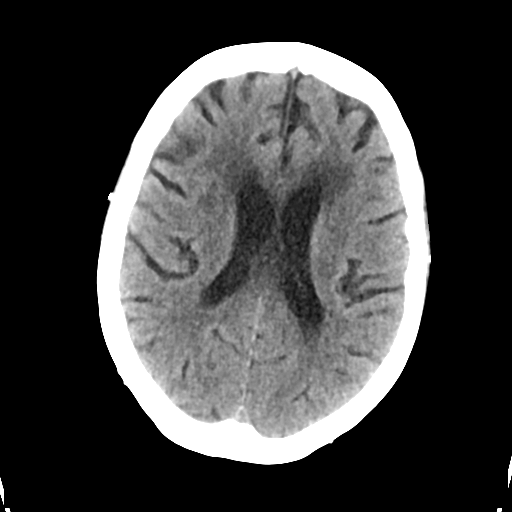
[im 18/29  brain]
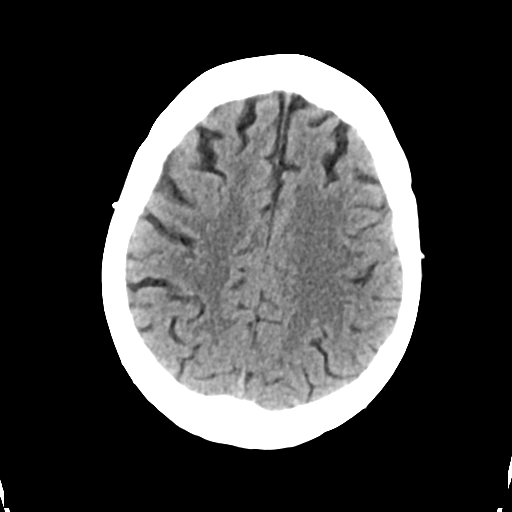
[im 18/29  bone]
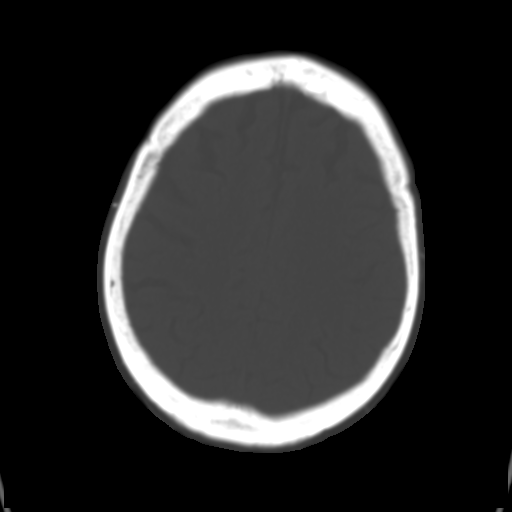
[im 22/29  brain]
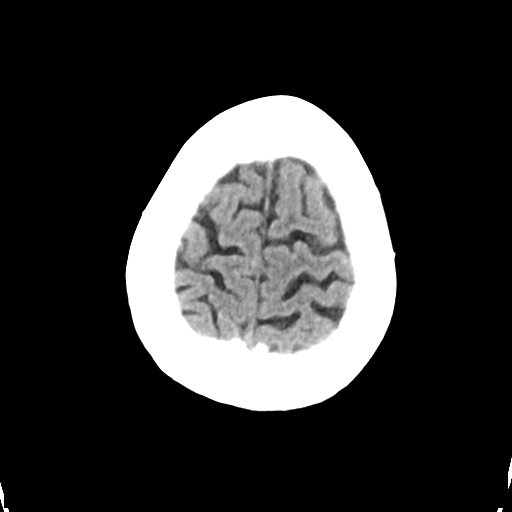
[im 25/29  brain]
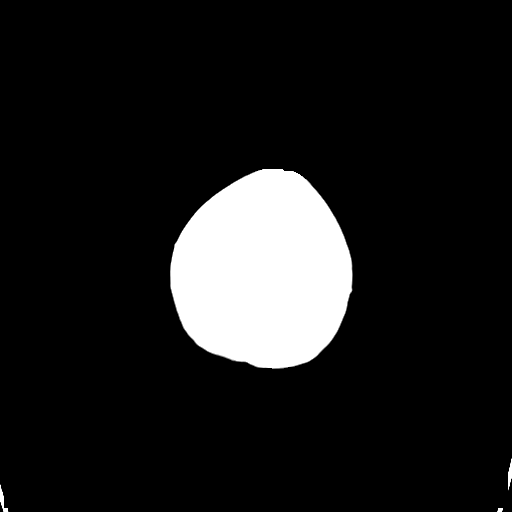

[Series 3: head bone · axial · 0.41mm/px · z∈[-88,-60]mm · 3 of 71 slices shown]
[im 8/71  bone]
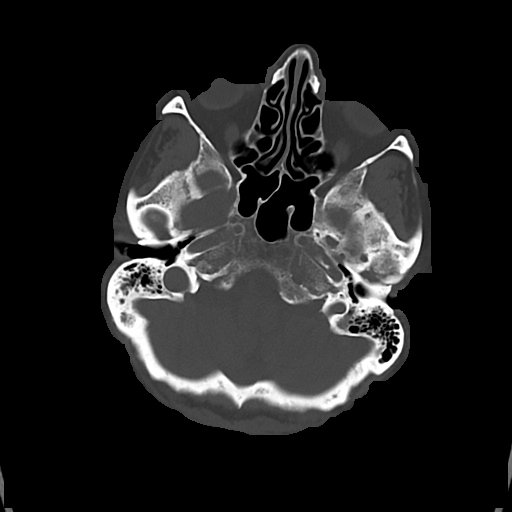
[im 15/71  bone]
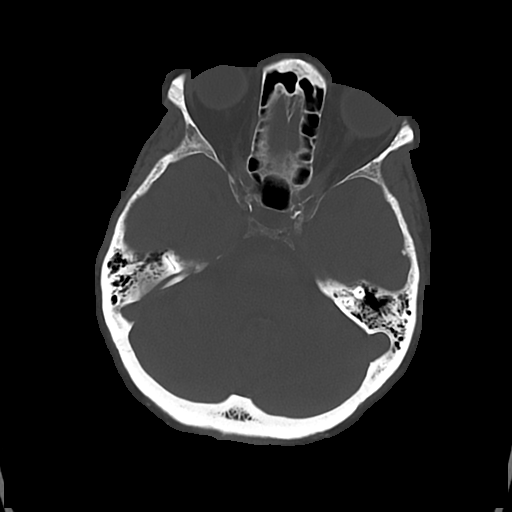
[im 22/71  bone]
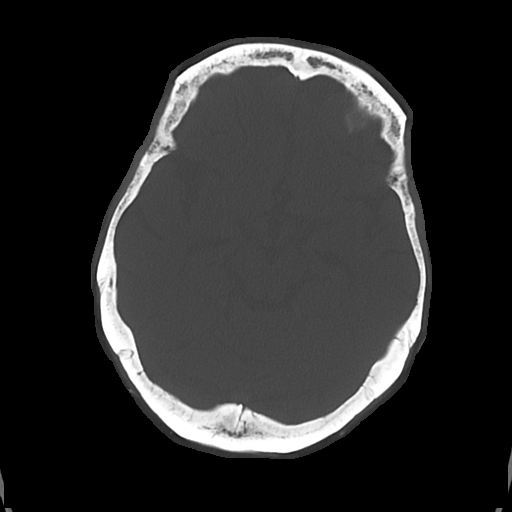

[Series 4: cor soft · coronal · 0.29mm/px · 3 of 61 slices shown]
[im 21/61  brain]
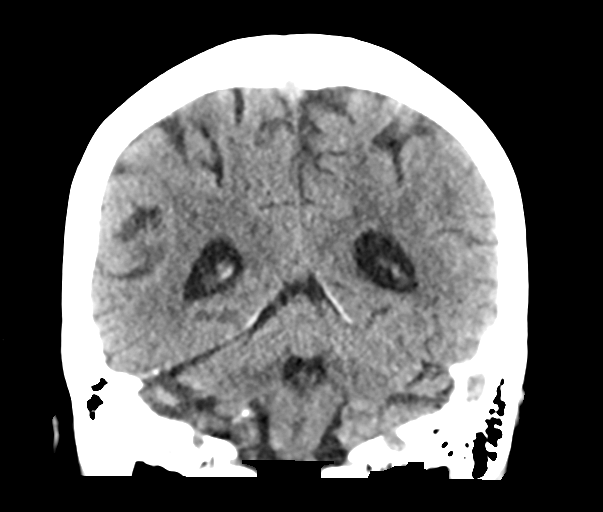
[im 27/61  brain]
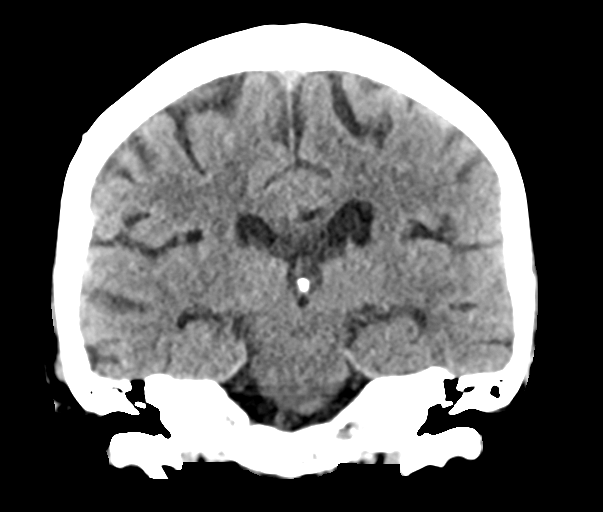
[im 34/61  brain]
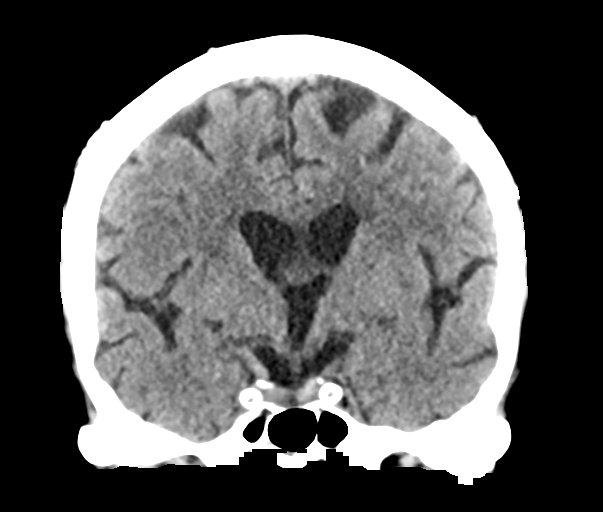

[Series 5: sag soft · sagittal · 0.28mm/px · 3 of 52 slices shown]
[im 18/52  brain]
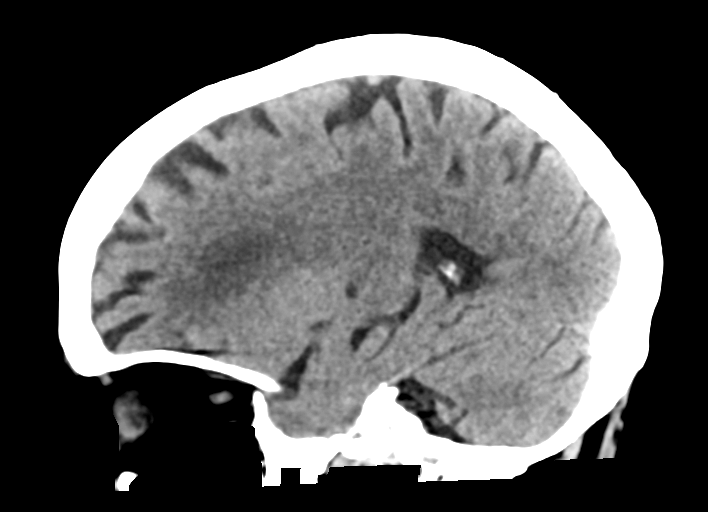
[im 26/52  brain]
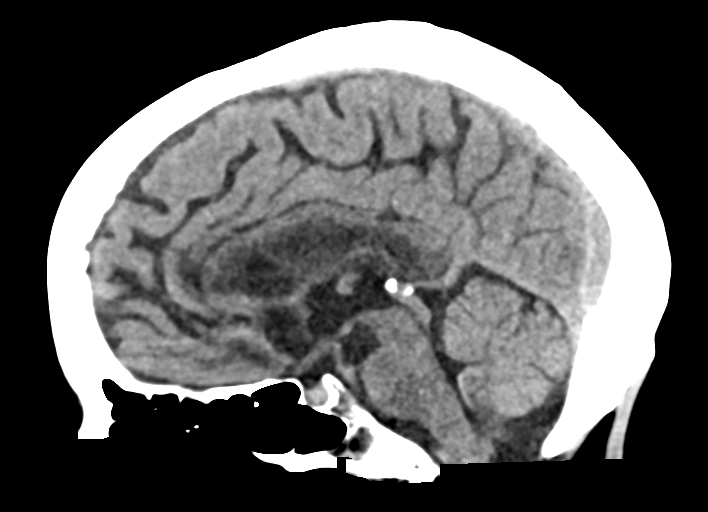
[im 35/52  brain]
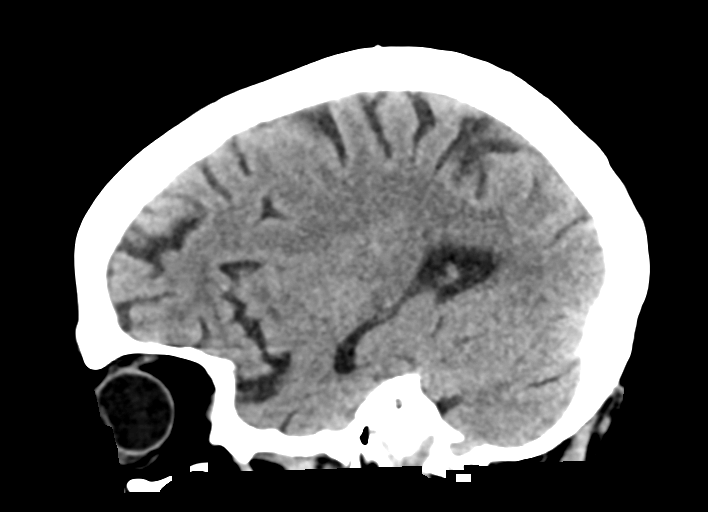

[16 of 47 positions shown; findings below may reference images not displayed]

FINDINGS: Brain: No acute finding. Age related atrophy. Chronic small-vessel
ischemic changes of the white matter. Old small vessel infarctions
of the basal ganglia and thalami. No sign of acute infarction, mass
lesion, hemorrhage, hydrocephalus or extra-axial collection.

Vascular: There is atherosclerotic calcification of the major
vessels at the base of the brain.

Skull: Negative

Sinuses/Orbits: Clear/normal

Other: None
IMPRESSION: No acute finding. Atrophy and chronic small-vessel ischemic changes
as above, similar to previous exams.

## 2021-08-01 IMAGING — DX DG CHEST 1V PORT
1 series · 1 of 1 positions shown · non-contrast
Comparison: [DATE]

CLINICAL DATA: Altered mental status sepsis

EXAM:
PORTABLE CHEST 1 VIEW

[chest ap]
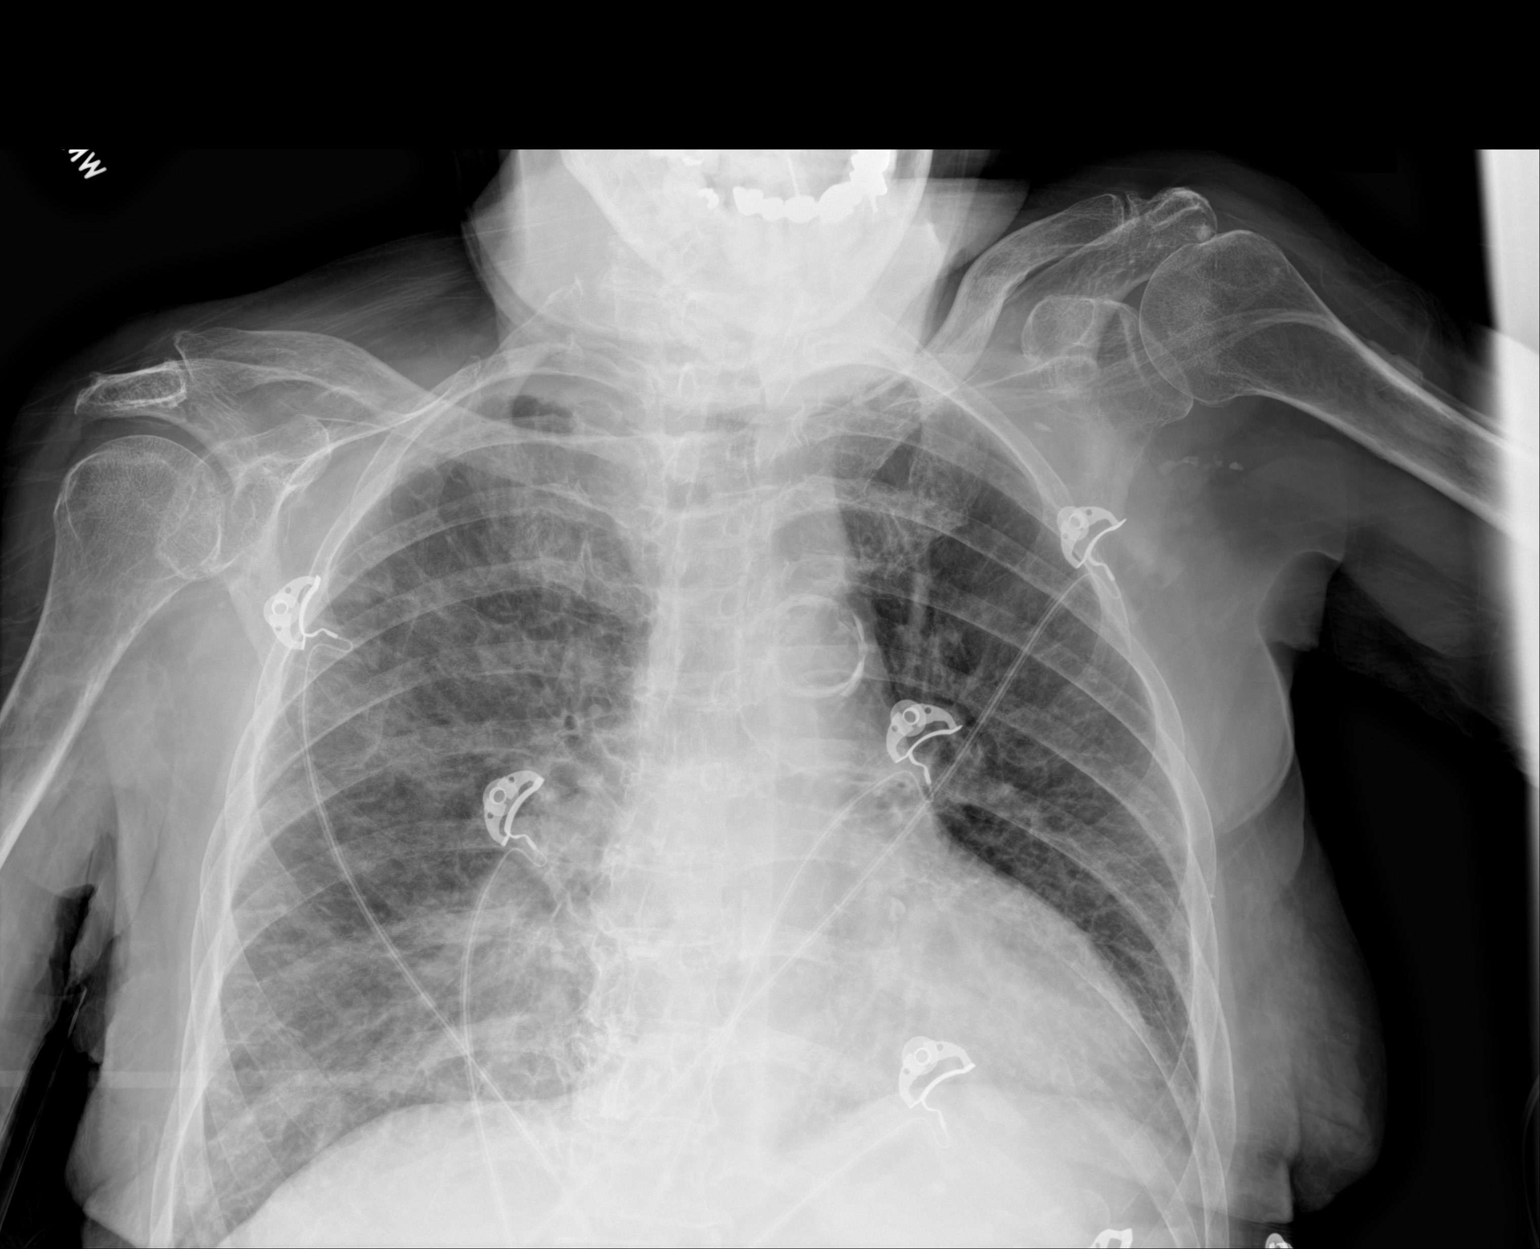

[1 of 1 positions shown; findings below may reference images not displayed]

FINDINGS: Borderline cardiac size. Vascular congestion. Heterogeneous
asymmetric right greater than left interstitial and ground-glass
opacity. No pleural effusion or pneumothorax. Aortic
atherosclerosis.
IMPRESSION: Asymmetric heterogeneous right greater than left interstitial and
hazy pulmonary density which may be due to asymmetric edema or
diffuse infection to include atypical process

## 2021-08-01 IMAGING — CT CT ABD-PELV W/O CM
2 of 4 series · 15 of 46 positions shown, 17 images · non-contrast
Comparison: CTA abdomen and pelvis [DATE]

CLINICAL DATA: Nausea and vomiting



[Series 2: routine abd/pel wo · axial · 0.66mm/px · z∈[-1385,-965]mm · 12 of 96 slices shown, 14 images]
[im 8/96  soft-tissue]
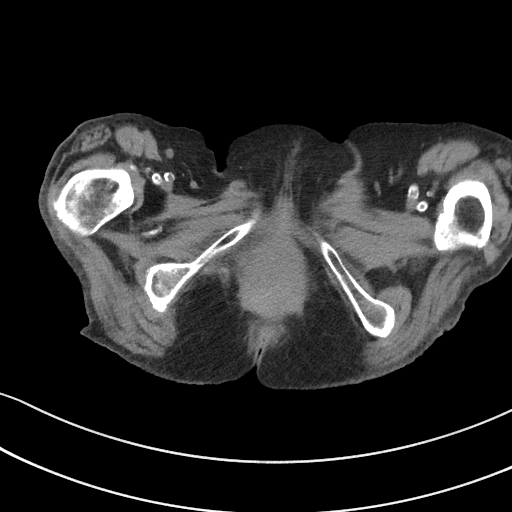
[im 8/96  bone]
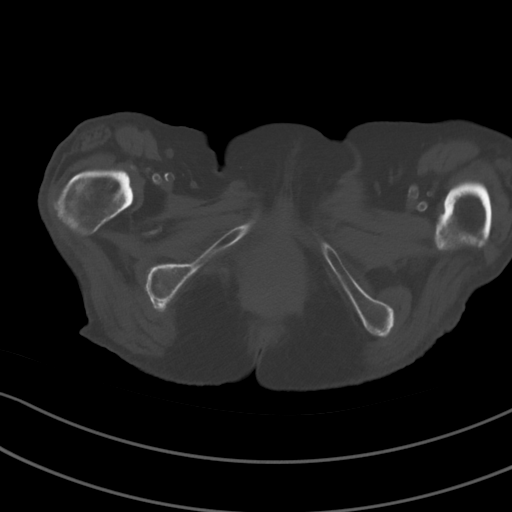
[im 16/96  soft-tissue]
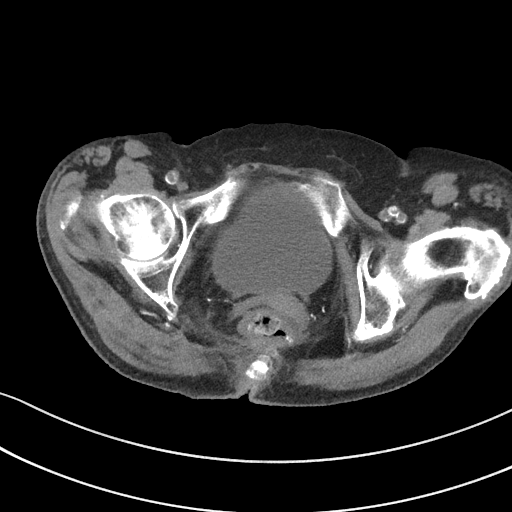
[im 23/96  soft-tissue]
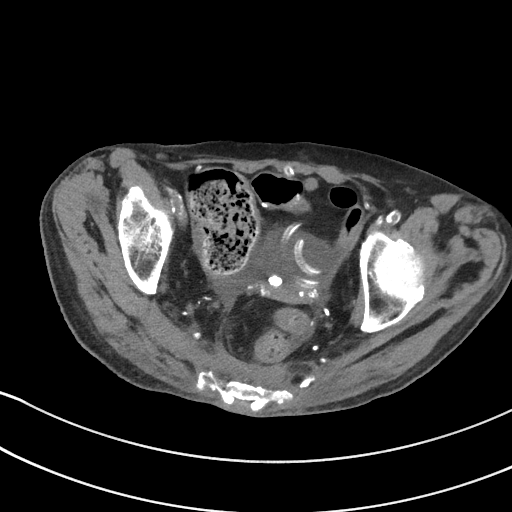
[im 31/96  soft-tissue]
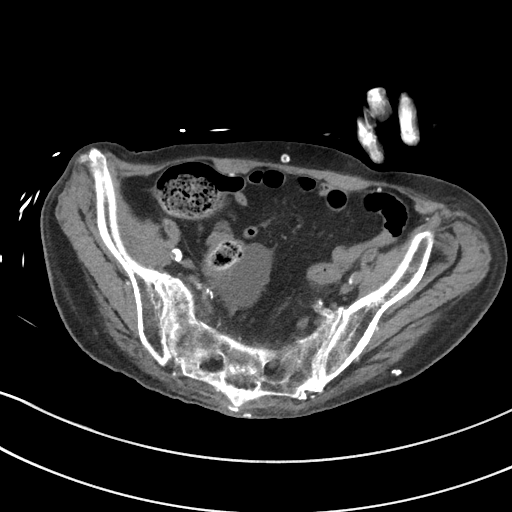
[im 39/96  soft-tissue]
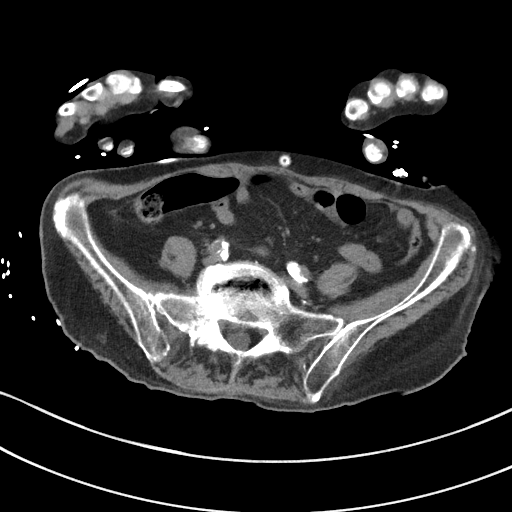
[im 46/96  soft-tissue]
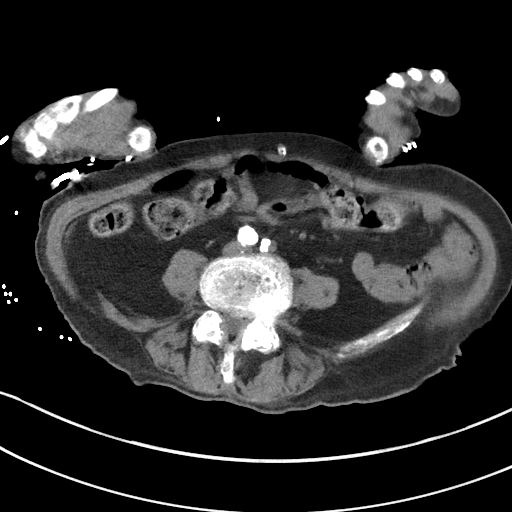
[im 54/96  soft-tissue]
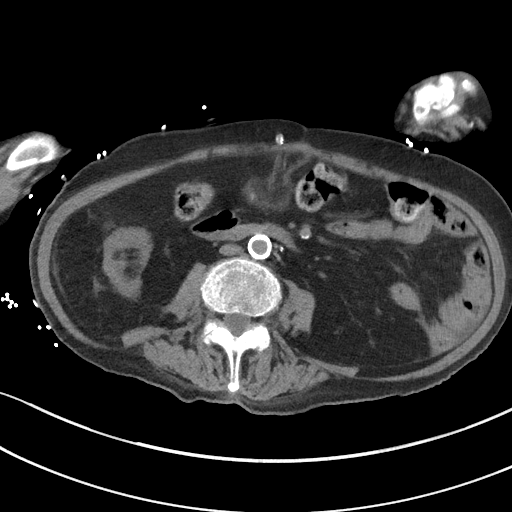
[im 61/96  soft-tissue]
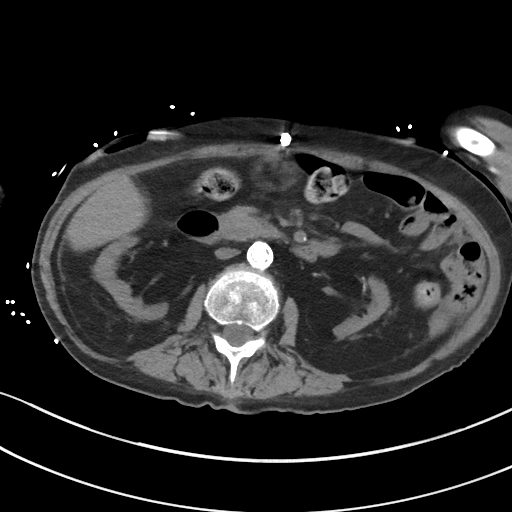
[im 69/96  soft-tissue]
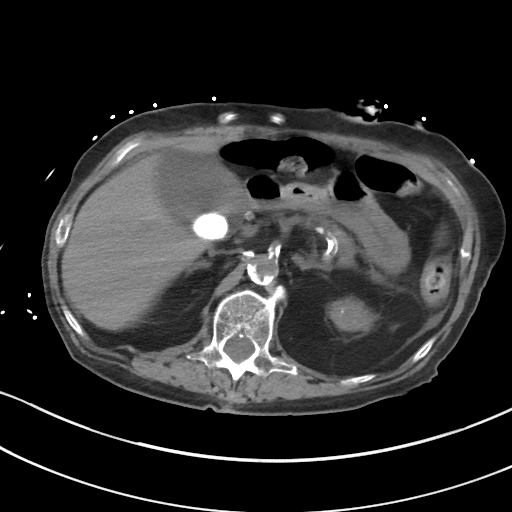
[im 69/96  bone]
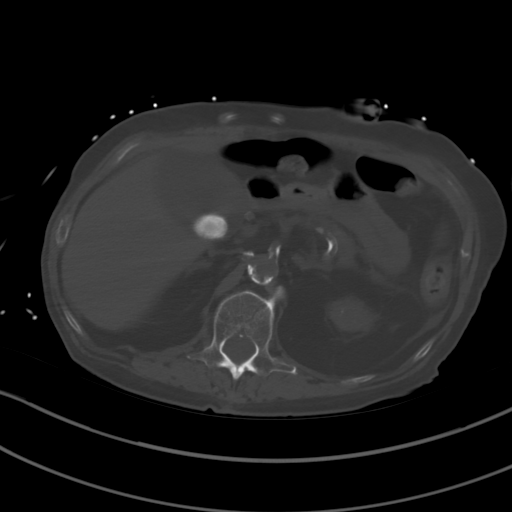
[im 77/96  soft-tissue]
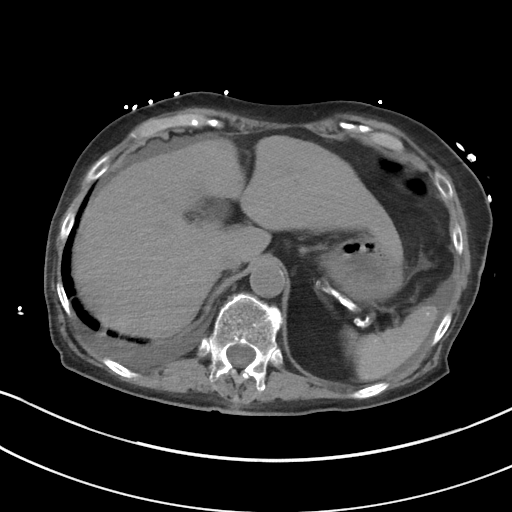
[im 84/96  soft-tissue]
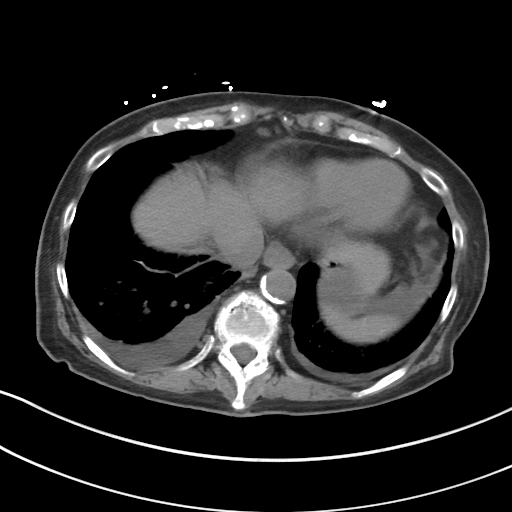
[im 92/96  soft-tissue]
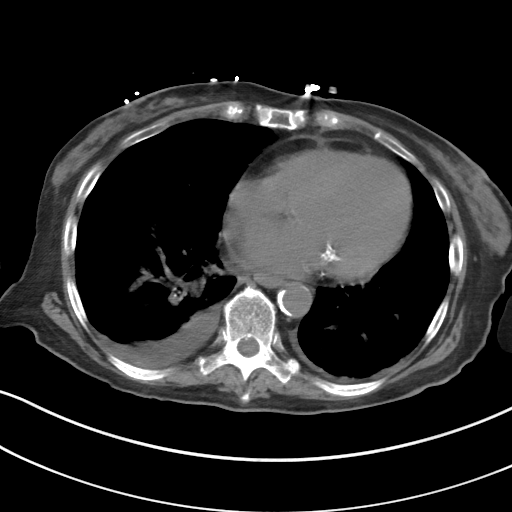

[Series 5: coronal st · coronal · 0.81mm/px · 3 of 83 slices shown]
[im 28/83  soft-tissue]
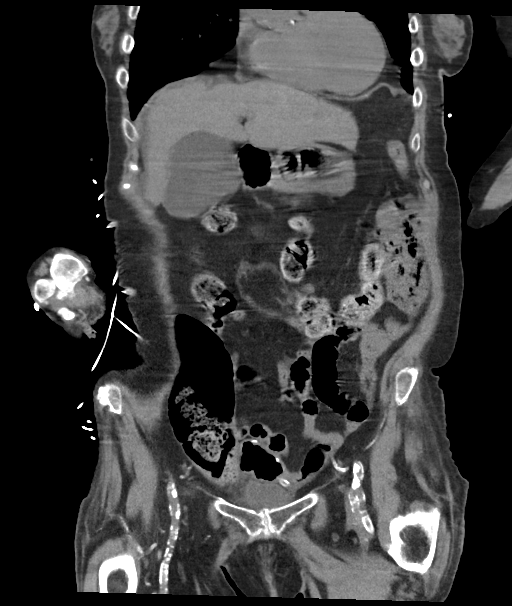
[im 37/83  soft-tissue]
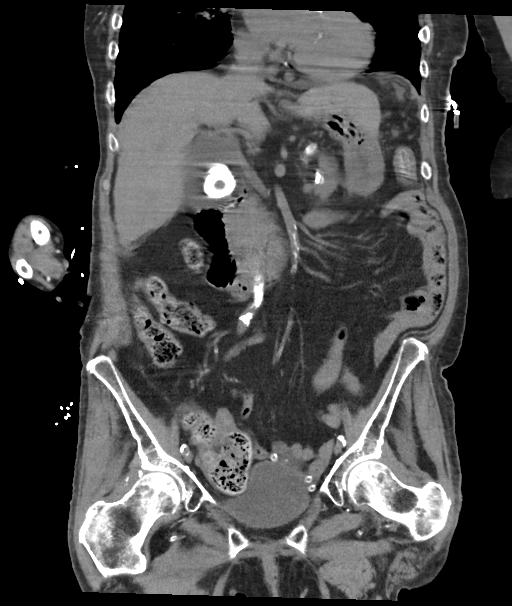
[im 46/83  soft-tissue]
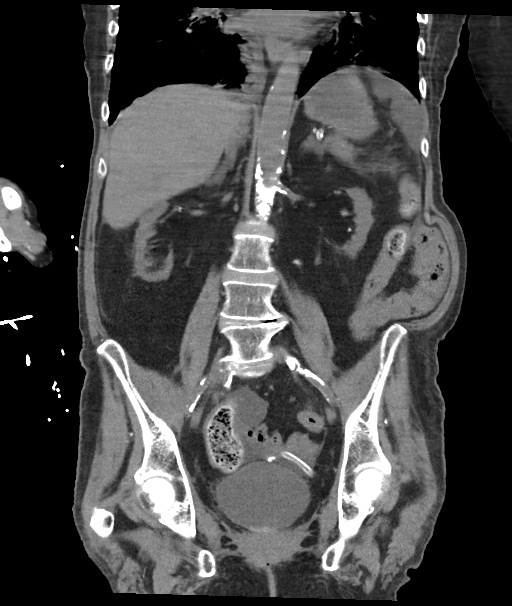

[15 of 46 positions shown; findings below may reference images not displayed]

FINDINGS: Lower chest: Small right and trace left pleural effusions. Hazy and
irregular densities in the lower lungs likely represent subsegmental
atelectasis and possibly infiltrates, greater on the right.

Hepatobiliary: Liver is normal in size with no focal mass
identified. Gallbladder is significantly distended and contains a
2.6 cm calculus. No significant gallbladder wall thickening or
surrounding inflammatory changes. No biliary ductal dilatation
appreciated.

Pancreas: Mild peripancreatic fat stranding at the tail the
pancreas. No pancreatic ductal dilatation.

Spleen: Normal size with several tiny calcified granulomas.

Adrenals/Urinary Tract: Adrenal glands appear normal. Kidneys are
atrophic with scattered likely vascular calcifications. No definite
nephrolithiasis or hydronephrosis identified bilaterally. Urinary
bladder appears normal.

Stomach/Bowel: Tiny hiatal hernia. No bowel obstruction, free air or
pneumatosis. No bowel wall edema identified. No evidence of acute
appendicitis.

Vascular/Lymphatic: Severe atherosclerotic disease including large
nearly occlusive appearing calcified plaque in the mid abdominal
aorta. No bulky lymphadenopathy identified.

Reproductive: Small uterus with calcifications.

Other: Small volume ascites mostly in the upper abdomen. Stable
loculated fluid collection in the right pelvis which appears chronic
and likely postsurgical in nature. Stable chronic area of fat
necrosis in the mid abdomen near the level of the umbilicus. A
percutaneous catheter is again seen with the tip coiled in the
pelvis.

Musculoskeletal: Advanced degenerative changes of the spine. No
suspicious bony lesions visualized.
IMPRESSION: 1. Mild peripancreatic edema which could be secondary to mild acute
pancreatitis or related to the small volume ascites. Correlate
clinically.
2. Cholelithiasis and markedly distended gallbladder. No gallbladder
wall thickening or surrounding inflammatory changes visualized.
Correlate clinically and consider follow-up ultrasound if indicated.
3. Small right pleural effusion and trace left pleural effusion.
Associated subsegmental atelectatic changes and possibly infiltrates
in the lower lungs.
4. Severe atherosclerotic disease.
5. Other chronic findings as described.

## 2021-08-01 IMAGING — US US ABDOMEN LIMITED
1 series · 14 of 25 positions shown · non-contrast
Comparison: CT [DATE]

CLINICAL DATA: epigastric pain, sepsis

EXAM:
ULTRASOUND ABDOMEN LIMITED RIGHT UPPER QUADRANT

[Series 1: us abdomen limited ruq (liver/gb) · 14 of 47 slices shown]
[im 1/47]
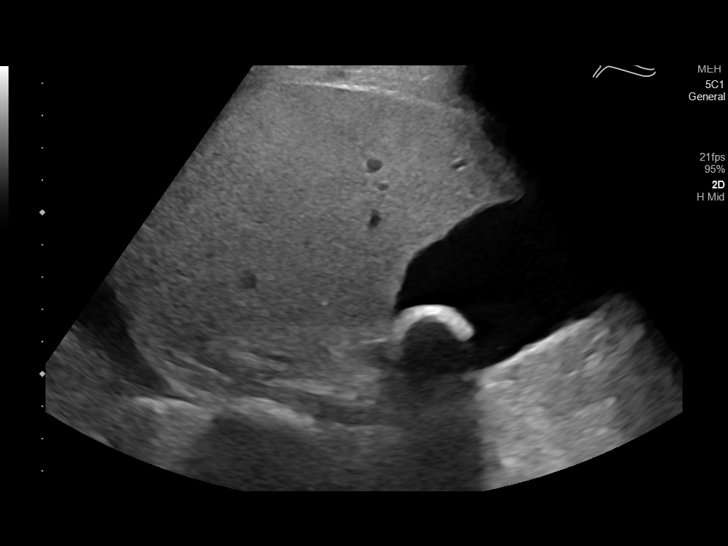
[im 4/47]
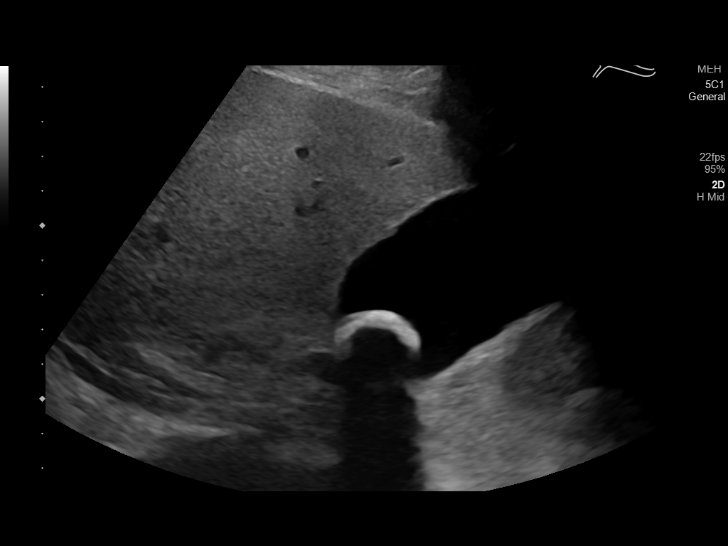
[im 8/47]
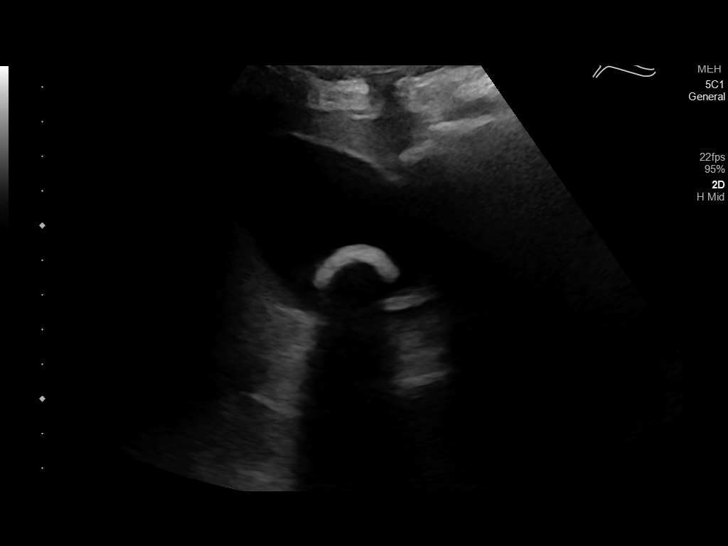
[im 12/47]
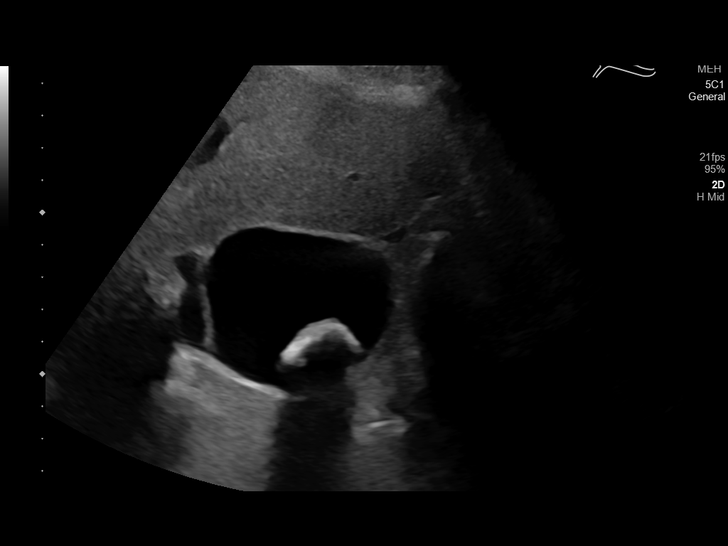
[im 16/47]
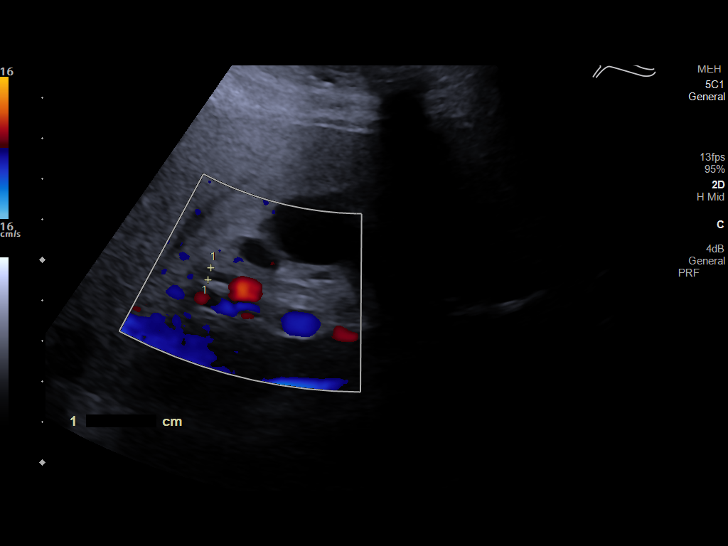
[im 18/47]
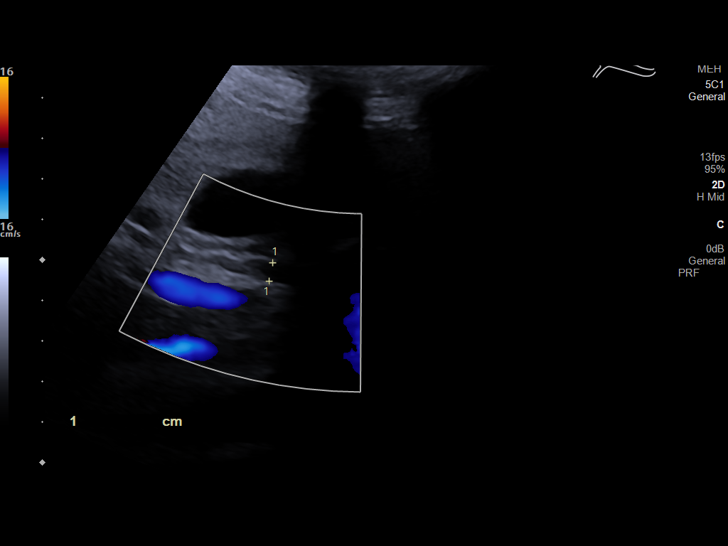
[im 22/47]
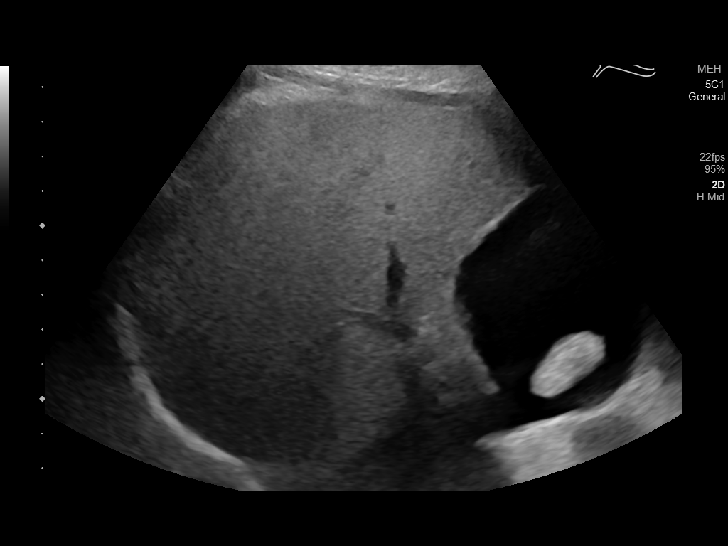
[im 25/47]
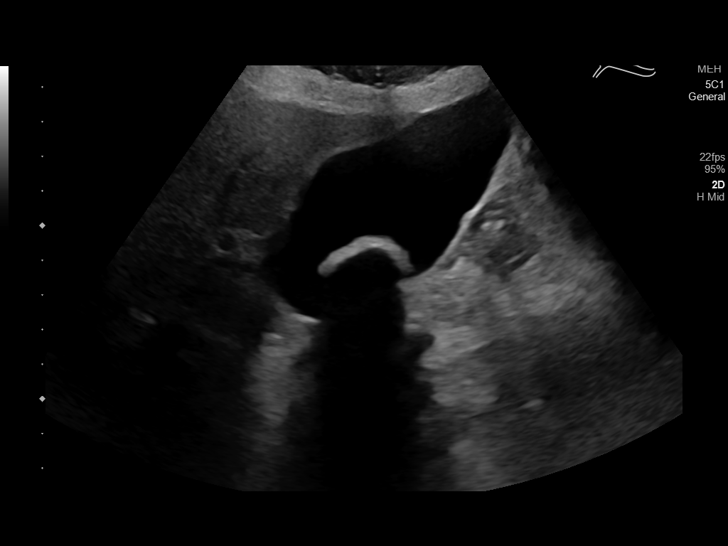
[im 29/47]
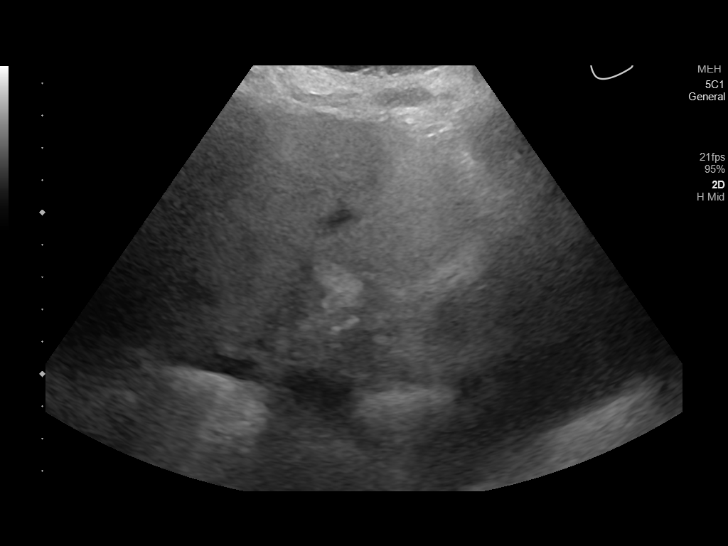
[im 31/47]
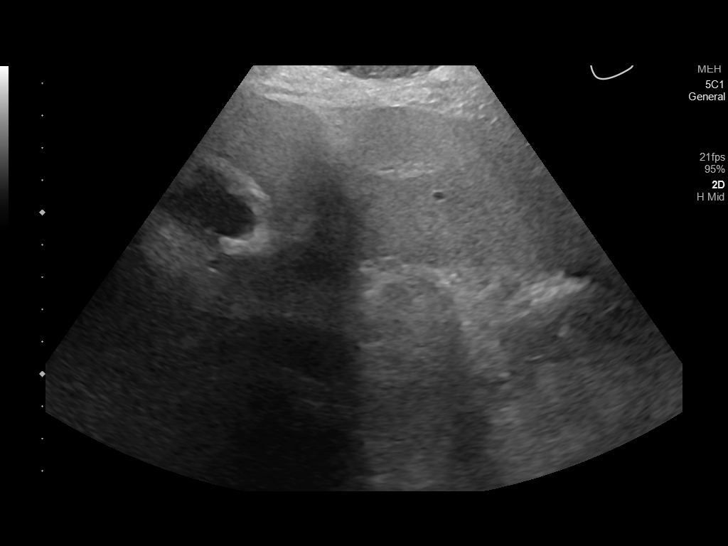
[im 35/47]
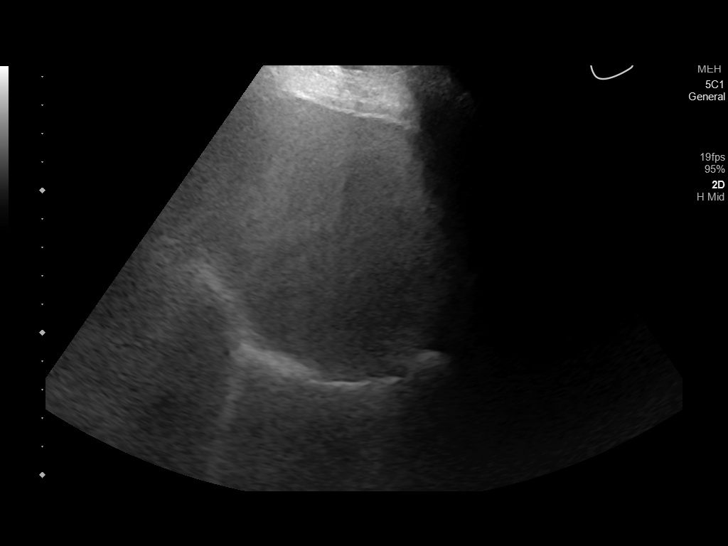
[im 39/47]
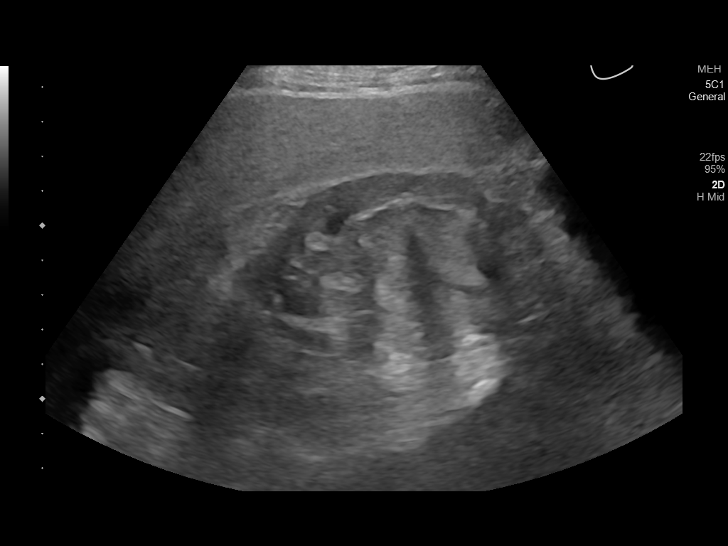
[im 43/47]
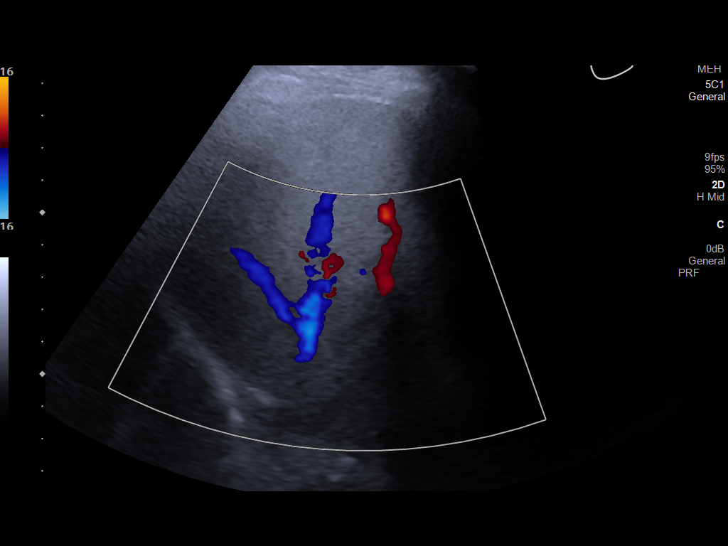
[im 47/47]
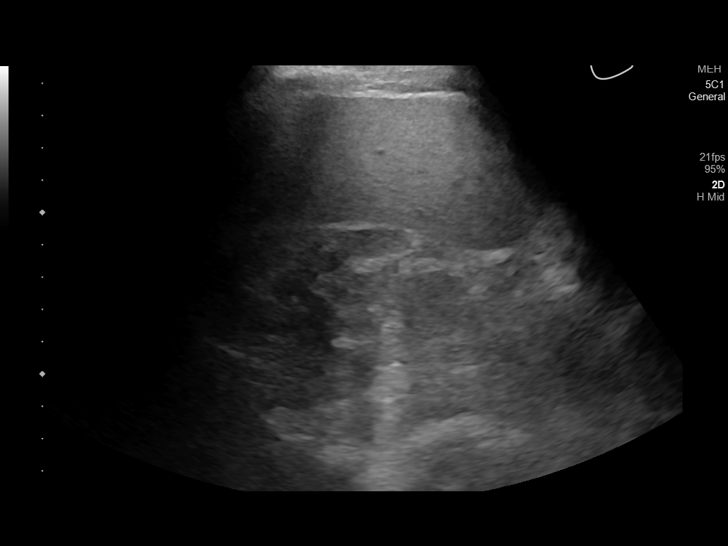

[14 of 25 positions shown; findings below may reference images not displayed]

FINDINGS: Gallbladder:

Dilated gallbladder with large intraluminal stone which measures up
to 2.6 cm. No wall thickening or pericholecystic fluid. Reportedly
positive sonographic Murphy sign.

Common bile duct:

Diameter: 4.6 mm, normal.  No intrahepatic biliary ductal dilation.

Liver:

Diffusely increased liver echogenicity. Portal vein is patent on
color Doppler imaging with normal direction of blood flow towards
the liver.

Other: Right pleural effusion.
IMPRESSION: Dilated gallbladder with large intraluminal stone measuring up to
2.6 cm as seen on recent CT. No wall thickening or pericholecystic
fluid. Reportedly positive sonographic Murphy sign, which is
nonspecific in the setting of suspected pancreatitis. Correlate with
exam and laboratory findings.

Diffuse hepatic steatosis.

## 2021-08-01 MED ORDER — CARVEDILOL 12.5 MG PO TABS: 12.5000 mg | ORAL_TABLET | Freq: Two times a day (BID) | ORAL | Status: DC

## 2021-08-01 MED ORDER — SODIUM CHLORIDE 0.9 % IV SOLN
2.0000 g | Freq: Once | INTRAVENOUS | Status: AC
  Administered 2021-08-01: 2 g via INTRAVENOUS
  Filled 2021-08-01: qty 12.5

## 2021-08-01 MED ORDER — LACTATED RINGERS IV BOLUS
1000.0000 mL | Freq: Once | INTRAVENOUS | Status: AC
Start: 2021-08-01 — End: 2021-08-01
  Administered 2021-08-01: 1000 mL via INTRAVENOUS

## 2021-08-01 MED ORDER — MORPHINE SULFATE (PF) 2 MG/ML IV SOLN
2.0000 mg | INTRAVENOUS | Status: DC | PRN
  Administered 2021-08-02: 2 mg via INTRAVENOUS
  Filled 2021-08-01: qty 1

## 2021-08-01 MED ORDER — LACTATED RINGERS IV BOLUS
1000.0000 mL | Freq: Once | INTRAVENOUS | Status: AC
  Administered 2021-08-01: 1000 mL via INTRAVENOUS

## 2021-08-01 MED ORDER — INSULIN ASPART 100 UNIT/ML IJ SOLN: 0.0000 [IU] | Freq: Three times a day (TID) | INTRAMUSCULAR | Status: DC

## 2021-08-01 MED ORDER — ONDANSETRON HCL 4 MG/2ML IJ SOLN
INTRAMUSCULAR | Status: AC
  Administered 2021-08-01: 4 mg
  Filled 2021-08-01: qty 2

## 2021-08-01 MED ORDER — SEVELAMER CARBONATE 800 MG PO TABS: 1600.0000 mg | ORAL_TABLET | Freq: Two times a day (BID) | ORAL | Status: DC

## 2021-08-01 MED ORDER — ONDANSETRON HCL 4 MG/2ML IJ SOLN: 4.0000 mg | Freq: Once | INTRAMUSCULAR | Status: AC

## 2021-08-01 MED ORDER — ACETAMINOPHEN 325 MG PO TABS: 650.0000 mg | ORAL_TABLET | Freq: Four times a day (QID) | ORAL | Status: DC | PRN

## 2021-08-01 MED ORDER — ONDANSETRON HCL 4 MG/2ML IJ SOLN: 4.0000 mg | Freq: Four times a day (QID) | INTRAMUSCULAR | Status: DC | PRN

## 2021-08-01 MED ORDER — HEPARIN SODIUM (PORCINE) 5000 UNIT/ML IJ SOLN
5000.0000 [IU] | Freq: Three times a day (TID) | INTRAMUSCULAR | Status: DC
  Administered 2021-08-01: 5000 [IU] via SUBCUTANEOUS
  Filled 2021-08-01: qty 1

## 2021-08-01 MED ORDER — INSULIN ASPART 100 UNIT/ML IJ SOLN
10.0000 [IU] | Freq: Once | INTRAMUSCULAR | Status: AC
  Administered 2021-08-01: 10 [IU] via INTRAVENOUS
  Filled 2021-08-01: qty 1

## 2021-08-01 MED ORDER — LACTATED RINGERS IV SOLN: INTRAVENOUS | Status: DC

## 2021-08-01 MED ORDER — VITAMIN D 25 MCG (1000 UNIT) PO TABS: 2000.0000 [IU] | ORAL_TABLET | ORAL | Status: DC

## 2021-08-01 MED ORDER — METRONIDAZOLE 500 MG/100ML IV SOLN
500.0000 mg | Freq: Two times a day (BID) | INTRAVENOUS | Status: DC
  Administered 2021-08-02 (×2): 500 mg via INTRAVENOUS
  Filled 2021-08-01 (×3): qty 100

## 2021-08-01 MED ORDER — HYDRALAZINE HCL 20 MG/ML IJ SOLN
5.0000 mg | INTRAMUSCULAR | Status: DC | PRN
  Administered 2021-08-02: 5 mg via INTRAVENOUS
  Filled 2021-08-01: qty 1

## 2021-08-01 MED ORDER — ONDANSETRON HCL 4 MG PO TABS: 4.0000 mg | ORAL_TABLET | Freq: Four times a day (QID) | ORAL | Status: DC | PRN

## 2021-08-01 MED ORDER — SODIUM CHLORIDE 0.9 % IV SOLN
1.0000 g | INTRAVENOUS | Status: DC
  Administered 2021-08-02: 1 g via INTRAVENOUS
  Filled 2021-08-01: qty 10

## 2021-08-01 MED ORDER — CHLORHEXIDINE GLUCONATE CLOTH 2 % EX PADS
6.0000 | MEDICATED_PAD | Freq: Every day | CUTANEOUS | Status: DC
  Administered 2021-08-02 – 2021-08-03 (×2): 6 via TOPICAL

## 2021-08-01 MED ORDER — METRONIDAZOLE 500 MG/100ML IV SOLN
500.0000 mg | Freq: Once | INTRAVENOUS | Status: AC
  Administered 2021-08-01: 500 mg via INTRAVENOUS
  Filled 2021-08-01: qty 100

## 2021-08-01 MED ORDER — ACETAMINOPHEN 650 MG RE SUPP: 650.0000 mg | Freq: Four times a day (QID) | RECTAL | Status: DC | PRN

## 2021-08-01 NOTE — Assessment & Plan Note (Signed)
PDF analysis negative.

## 2021-08-01 NOTE — Assessment & Plan Note (Signed)
Blood pressure (!) 146/113, pulse (!) 126, temperature 97.7 F (36.5 C), temperature source Axillary, resp. rate (!) 23, weight 49.9 kg, SpO2 94 %. pt continue on coreg and hydralazine.

## 2021-08-01 NOTE — Assessment & Plan Note (Signed)
Follow cultures and sensitivities.  Stepdown as pt is full code hypoxic and may require vasopressor support.

## 2021-08-01 NOTE — Progress Notes (Signed)
PHARMACY -  BRIEF ANTIBIOTIC NOTE   Pharmacy has received consult(s) for cefepime from an ED provider.  The patient's profile has been reviewed for ht/wt/allergies/indication/available labs.    One time order(s) placed for 2 grams IV cefepime x 1  Further antibiotics/pharmacy consults should be ordered by admitting physician if indicated.                       Thank you, Dallie Piles 08/01/2021  7:03 PM

## 2021-08-01 NOTE — Progress Notes (Signed)
PD catheter clean ,dry and intact. Instilled 200 ml of dialysate then drained and PD fluid sample collected. PD catheter capped and secured. PD fluid sample sent to the Lab.

## 2021-08-01 NOTE — Assessment & Plan Note (Signed)
Attribute encephalopathy to her sepsis states and infection.  Pt is coming to with IVF and IV Abx.  We will cotn with supportive care.  Nephrology consult per am team.  Neuro check. Aspiration./ fall precaution.

## 2021-08-01 NOTE — Assessment & Plan Note (Signed)
Increased oxygen and we will admit to stepdown.

## 2021-08-01 NOTE — Progress Notes (Signed)
Pharmacy Antibiotic Note  Misty [name] is a 81 y.o. female admitted on 08/01/2021 with sepsis.  Pharmacy has been consulted for Cefepime dosing.  Pt is on peritoneal dialysis.   Plan: Cefepime 2 gm IV X 1 given on 5/25 @ 1911. Cefepime 1 gm IV Q24H ordered to start on 5/26 @ 1900.   Height:  (unable to get weight. bed scale is error) Weight:  (unable to get weight. bed scale is error)  Temp (24hrs), Avg:98.9 F (37.2 C), Min:97.7 F (36.5 C), Max:100 F (37.8 C)  Recent Labs  Lab 08/01/21 1804 08/01/21 2054  WBC 15.9*  --   CREATININE 4.02*  --   LATICACIDVEN 4.8* 5.1*    Estimated Creatinine Clearance: 9.1 mL/min (A) (by C-G formula based on SCr of 4.02 mg/dL (H)).    Allergies  Allergen Reactions   Succinylcholine Other (See Comments)    Severe myalgias   Penicillins Rash    Tolerated cefazolin prior    Antimicrobials this admission:   >>    >>   Dose adjustments this admission:   Microbiology results:  BCx:   UCx:    Sputum:    MRSA PCR:   Thank you for allowing pharmacy to be a part of this patient's care.  Sagan Maselli D 08/01/2021 11:08 PM

## 2021-08-01 NOTE — ED Triage Notes (Signed)
Pt BIB EMS due to having AMS that started 2 days ago. Per EMS, pt is usually a&ox4 and independently with ADLs. Per family, pt has been vomiting for about 2 days. Pt does PD every night. Pt received 100cc of D10 for a CBG of 55, repeat CBG read High for EMS.

## 2021-08-01 NOTE — ED Provider Notes (Signed)
[facility] Provider Note    Event Date/Time   First MD Initiated Contact with Patient 08/01/21 1805     (approximate)   History   Chief Complaint: Altered Mental Status   HPI  Misty [name] is a 81 y.o. female with a history of ESRD on peritoneal dialysis, diastolic heart failure, type 2 diabetes, multiple myeloma, hypertension, rectal cancer who is brought to the ED due to altered mental status for the last 2 days associate with nausea and vomiting.  Reportedly patient is normally independent and fully oriented which is acutely changed today.  She has been compliant with peritoneal dialysis.     Physical Exam   Triage Vital Signs: ED Triage Vitals  Enc Vitals Group     BP 08/01/21 1800 (!) 173/77     Pulse Rate 08/01/21 1800 (!) 125     Resp 08/01/21 1800 19     Temp 08/01/21 1800 100 F (37.8 C)     Temp Source 08/01/21 1800 Rectal     SpO2 08/01/21 1800 94 %     Weight 08/01/21 1759 110 lb (49.9 kg)     Height --      Head Circumference --      Peak Flow --      Pain Score --      Pain Loc --      Pain Edu? --      Excl. in GC? --     Most recent vital signs: Vitals:   08/01/21 2100 08/01/21 2130  BP: (!) 163/75 (!) 148/112  Pulse: (!) 122 (!) 120  Resp: (!) 22 (!) 27  Temp:    SpO2: 90% 95%    General: Awake, ill-appearing. CV:  Good peripheral perfusion.  Tachycardia heart rate 120 Resp:  Normal effort.  Clear to auscultation bilaterally Abd:  No distention.  Soft with epigastric tenderness. Other:  .  Dry mucous membranes.  No wounds, no rash.  No lower extremity swelling or calf tenderness.   ED Results / Procedures / Treatments   Labs (all labs ordered are listed, but only abnormal results are displayed) Labs Reviewed  COMPREHENSIVE METABOLIC PANEL - Abnormal; Notable for the following components:      Result Value   Sodium 134 (*)    Chloride 97 (*)    CO2 18 (*)    Glucose, Bld 662 (*)    BUN 33 (*)     Creatinine, Ser 4.02 (*)    Albumin 2.4 (*)    GFR, Estimated 11 (*)    Anion gap 19 (*)    All other components within normal limits  LACTIC ACID, PLASMA - Abnormal; Notable for the following components:   Lactic Acid, Venous 4.8 (*)    All other components within normal limits  LACTIC ACID, PLASMA - Abnormal; Notable for the following components:   Lactic Acid, Venous 5.1 (*)    All other components within normal limits  CBC WITH DIFFERENTIAL/PLATELET - Abnormal; Notable for the following components:   WBC 15.9 (*)    RDW 16.2 (*)    Platelets 422 (*)    Neutro Abs 14.6 (*)    Lymphs Abs 0.6 (*)    Abs Immature Granulocytes 0.12 (*)    All other components within normal limits  URINALYSIS, ROUTINE W REFLEX MICROSCOPIC - Abnormal; Notable for the following components:   Color, Urine YELLOW (*)    APPearance HAZY (*)    Glucose, UA >=500 (*)  Ketones, ur 5 (*)    Protein, ur >=300 (*)    Bacteria, UA RARE (*)    All other components within normal limits  LIPASE, BLOOD - Abnormal; Notable for the following components:   Lipase 361 (*)    All other components within normal limits  BODY FLUID CELL COUNT WITH DIFFERENTIAL - Abnormal; Notable for the following components:   Color, Fluid COLORLESS (*)    All other components within normal limits  TRIGLYCERIDES - Abnormal; Notable for the following components:   Triglycerides 359 (*)    All other components within normal limits  CBG MONITORING, ED - Abnormal; Notable for the following components:   Glucose-Capillary 584 (*)    All other components within normal limits  CBG MONITORING, ED - Abnormal; Notable for the following components:   Glucose-Capillary 590 (*)    All other components within normal limits  RESP PANEL BY RT-PCR (FLU A&B, COVID) ARPGX2  CULTURE, BLOOD (ROUTINE X 2)  CULTURE, BLOOD (ROUTINE X 2)  BODY FLUID CULTURE W GRAM STAIN  PROTIME-INR  BLOOD GAS, VENOUS  PATHOLOGIST SMEAR REVIEW      EKG    RADIOLOGY Chest x-ray viewed and interpreted by me, no focal consolidation.  Radiology report reviewed which suggests diffuse bilateral hazy density, possibly infectious versus edema.  CT head viewed and interpreted by me, negative for intracranial hemorrhage or mass.  Radiology report reviewed.  CT abdomen pelvis shows findings of acute pancreatitis as well as some potential evidence of gallbladder disease  Ultrasound right upper quadrant pending   PROCEDURES:  .Critical Care Performed by: Carrie Mew, MD Authorized by: Carrie Mew, MD   Critical care provider statement:    Critical care time (minutes):  35   Critical care time was exclusive of:  Separately billable procedures and treating other patients   Critical care was necessary to treat or prevent imminent or life-threatening deterioration of the following conditions:  Sepsis, shock, CNS failure or compromise, dehydration, renal failure and metabolic crisis   Critical care was time spent personally by me on the following activities:  Development of treatment plan with patient or surrogate, discussions with consultants, evaluation of patient's response to treatment, examination of patient, obtaining history from patient or surrogate, ordering and performing treatments and interventions, ordering and review of laboratory studies, ordering and review of radiographic studies, pulse oximetry, re-evaluation of patient's condition and review of old charts   Care discussed with: admitting provider   Comments:        .1-3 Lead EKG Interpretation Performed by: Carrie Mew, MD Authorized by: Carrie Mew, MD     Interpretation: abnormal     ECG rate:  120   ECG rate assessment: tachycardic     Rhythm: sinus tachycardia     Ectopy: none     Conduction: normal     MEDICATIONS ORDERED IN ED: Medications  ceFEPIme (MAXIPIME) 2 g in sodium chloride 0.9 % 100 mL IVPB (0 g Intravenous Stopped  08/01/21 2000)  metroNIDAZOLE (FLAGYL) IVPB 500 mg (0 mg Intravenous Stopped 08/01/21 2047)  lactated ringers bolus 1,000 mL (1,000 mLs Intravenous New Bag/Given 08/01/21 1908)  insulin aspart (novoLOG) injection 10 Units (10 Units Intravenous Given 08/01/21 2116)     IMPRESSION / MDM / [place] / ED COURSE  I reviewed the triage vital signs and the nursing notes.  Differential diagnosis includes, but is not limited to, pneumonia, dehydration, electrolyte abnormality, anemia, pancreatitis, cholecystitis, choledocholithiasis, colitis, bowel perforation, bowel obstruction, intra-abdominal abscess  Patient's presentation is most consistent with acute presentation with potential threat to life or bodily function.  Patient presents with upper abdominal pain, vomiting, altered mental status.  Patient has fever, tachycardia, tachypnea, concerning for sepsis.  Sepsis protocol initiated, and after 500 mL IV fluids given by EMS, I am giving an additional 1 L bolus due to clinically appearing dehydrated.  Chest x-ray suggest possible pulmonary edema, but this is not consistent with her clinical presentation.  Labs do show a markedly elevated lipase consistent with pancreatitis, and CT of the abdomen also corroborates this finding.  Additionally, there is possibility of gallbladder disease so I am ordering an ultrasound to further evaluate this.  On labs she has a metabolic acidosis which I think is due to lactic acidosis.  She has minimal ketones in urine and I do not think that she is in DKA.   ----------------------------------------- 9:48 PM on 08/01/2021 ----------------------------------------- ReSound shows cholelithiasis without cholecystitis.  Case discussed with hospitalist.      FINAL CLINICAL IMPRESSION(S) / ED DIAGNOSES   Final diagnoses:  Altered mental status, unspecified altered mental status type  Dehydration  ESRD on peritoneal dialysis  ([place])  Acute pancreatitis, unspecified complication status, unspecified pancreatitis type  Sepsis due to pneumonia [facility])     Rx / DC Orders   ED Discharge Orders     None        Note:  This document was prepared using Dragon voice recognition software and may include unintentional dictation errors.   Carrie Mew, MD 08/01/21 2148

## 2021-08-01 NOTE — ED Notes (Signed)
Report given to icu

## 2021-08-01 NOTE — Consult Note (Signed)
Patient ID: Misty [name], female   DOB: 10-18-1940, 81 y.o.   MRN: [id number]  HPI Misty [name] is a 81 y.o. female seen in consultation at the request of Dr. Posey Pronto for acute cholecystitis.  She does have significant medical issues including end-stage renal disease using peritoneal dialysis at home.  He also has a history of chronic back pain, congestive heart failure, peripheral vascular disease, end-stage renal disease, she d did have a recent pneumonia and was taking antibiotics.  Over the last few months her overall oral health has aligned significantly and she has been very weak.  History is taken from the daughter who is at the bedside. She reports a 2-day history of abdominal pain nausea and vomiting as well as altered mental status. Lower Gi bleed w embolization of the rectosigmoid arterial branches 05/27/2021.  Consequently had a colonoscopy by Dr. Vicente Males with no evidence of active bleeding. Did have a CT scan of the abdomen pelvis that I personally reviewed showing evidence of a gallstone with dilated gallbladder.  Some ascites and evidence of pancreatic fluid.  Ultrasound that showed dilation of the gallbladder with normal common bile duct. Actiq acid is 5.1, CO2 of 18 and glucose of 662 with a creatinine of 4.02 CBC shows a white count of 15.9. History of rectal cancer that require radiation chemotherapy and diverting loop ileostomy.  Multiple abdominal operations in the past this was in [place]. She Does have a history of high risk airway Daughter admits that her mentation usually is much better but over the last few weeks to months the patient condition has declined drastically and significantly.  She is able to only walk for a few steps and then has to stop.  She does not drive.  She has limited mobility 01/23/21 Right L5-S1 far lateral discectomy, right L5-S1 decompression with Dr. Izora Ribas 08/08/20 Lap PD catheter by Dr. Raul Del  I had an extensive discussion with the daughter who  is very nervous and is unable to provide any goals of care.  She does not want her mother on the ventilator but I think the patient's husband wants to do do everything for her.  Currently it is unclear what her true wishes of the patient.   HPI  Past Medical History:  Diagnosis Date   Anemia of chronic renal failure    Aortic valve stenosis 07/02/2017   a.) TTE 07/02/2017: 55-60%; mild AS with MPG 13.0 mmHg. b.) TTE 10/14/2019: EF >55%; MPG 23.0 mmHg. c.) TTE 06/23/2020: EF 50-55%; MPG 19.7 mmHg. d.) TTE 11/27/2020: EF >55%; MPG 18.0 mmHg.   Chronic lower back pain    Complication of anesthesia    a.) severe myalgias with succinylcholine use. b.) (+) difficult airway.   Diastolic dysfunction 94/17/4081   a.) TTE 07/02/2017; EF 55-60%; mild AR; G1DD. b.) TTE 06/23/2020: EF 50-55% with mild LVH; mild to mod LA enlargement; mild AR, mild to mod MR; G2DD.   Difficult airway for intubation    a.) 08/08/2020 at [facility] --> Initial attempt with Sabra Heck 3, larynx was "rigid/fixed"; unable to visualize anything but epiglottis. Glidescope utilized to obtain a great view with VL. First attempt with Glidescope unsuccessful d/t anterior orientation. ETT withdrawn and a greater bend was placed. Second attempt was successful with effort. Patient sustained lip laceration during intubation attempts.   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   a.) TTE 11/03/2019: measured 3.7 cm   ESRD on peritoneal dialysis ([place])    GERD (gastroesophageal reflux disease)    Glaucoma  Heart murmur    HLD (hyperlipidemia)    Hyperparathyroidism ([place])    a.) s/p total parathyroidectomy   Hypertension    Hypertensive retinopathy    Lumbar radiculopathy    MGUS (monoclonal gammopathy of unknown significance)    Motion sickness    Nonproliferative diabetic retinopathy ([place])    Osteoarthritis    Peritoneal dialysis catheter in place [facility])    Personal history of chemotherapy    Personal history of radiation therapy     Rectal cancer ([place]) 2011   a.) Clinical stage T3N0. b.) s/p 3 cycles of neoadjuvant CI 5FU + XRT c.) s/p resection on 08/08/2009.   Spinal stenosis of lumbar region with neurogenic claudication    T2DM (type 2 diabetes mellitus) ([place])    TIA (transient ischemic attack)     Past Surgical History:  Procedure Laterality Date   BACK SURGERY     BREAST BIOPSY Left    bx/ clip-neg   COLON SURGERY     COLONOSCOPY N/A 05/03/2020   Procedure: COLONOSCOPY;  Surgeon: Lesly Rubenstein, MD;  Location: [facility] ENDOSCOPY;  Service: Endoscopy;  Laterality: N/A;   COLONOSCOPY WITH PROPOFOL N/A 07/02/2021   Procedure: COLONOSCOPY WITH PROPOFOL;  Surgeon: [name], MD;  Location: [facility] ENDOSCOPY;  Service: Gastroenterology;  Laterality: N/A;   coloretcal cancer     DIALYSIS/PERMA CATHETER REMOVAL N/A 10/01/2020   Procedure: DIALYSIS/PERMA CATHETER REMOVAL;  Surgeon: Algernon Huxley, MD;  Location: [facility] CV LAB;  Service: Cardiovascular;  Laterality: N/A;   EMBOLIZATION N/A 06/06/2021   Procedure: EMBOLIZATION;  Surgeon: Algernon Huxley, MD;  Location: [place] CV LAB;  Service: Cardiovascular;  Laterality: N/A;   LUMBAR LAMINECTOMY/DECOMPRESSION MICRODISCECTOMY Right 01/23/2021   Procedure: RIGHT L5-S1 DECOMPRESSION;  Surgeon: Meade Maw, MD;  Location: [facility] ORS;  Service: Neurosurgery;  Laterality: Right;   thyroid gland     parathyroid   TUBAL LIGATION      Family History  Problem Relation Age of Onset   Lung disease Sister    Diabetes Mellitus II Paternal Grandmother    Breast cancer Neg Hx     Social History Social History   Tobacco Use   Smoking status: Never   Smokeless tobacco: Never  Vaping Use   Vaping Use: Never used  Substance Use Topics   Alcohol use: Never   Drug use: Never    Allergies  Allergen Reactions   Succinylcholine Other (See Comments)    Severe myalgias   Penicillins Rash    Tolerated cefazolin prior    Current Facility-Administered  Medications  Medication Dose Route Frequency Provider Last Rate Last Admin   acetaminophen (TYLENOL) tablet 650 mg  650 mg Oral Q6H PRN Para Skeans, MD       Or   acetaminophen (TYLENOL) suppository 650 mg  650 mg Rectal Q6H PRN Para Skeans, MD       [START ON 08/02/2021] carvedilol (COREG) tablet 12.5 mg  12.5 mg Oral BID WC Para Skeans, MD       [START ON 08/02/2021] ceFEPIme (MAXIPIME) 1 g in sodium chloride 0.9 % 100 mL IVPB  1 g Intravenous Q24H Para Skeans, MD       [START ON 08/02/2021] cholecalciferol (VITAMIN D3) tablet 2,000 Units  2,000 Units Oral Q M,W,F Para Skeans, MD       heparin injection 5,000 Units  5,000 Units Subcutaneous Q8H Florina Ou V, MD       hydrALAZINE (APRESOLINE) injection 5 mg  5 mg Intravenous Q4H PRN Para Skeans, MD       [START ON 08/02/2021] insulin aspart (novoLOG) injection 0-9 Units  0-9 Units Subcutaneous TID WC Patel, Gretta Cool, MD       lactated ringers bolus 1,000 mL  1,000 mL Intravenous Once Truett Mcfarlan F, MD       lactated ringers infusion   Intravenous Continuous Para Skeans, MD       [START ON 08/02/2021] metroNIDAZOLE (FLAGYL) IVPB 500 mg  500 mg Intravenous Q12H Para Skeans, MD       morphine (PF) 2 MG/ML injection 2 mg  2 mg Intravenous Q2H PRN Para Skeans, MD       ondansetron [facility]) tablet 4 mg  4 mg Oral Q6H PRN Para Skeans, MD       Or   ondansetron [facility]) injection 4 mg  4 mg Intravenous Q6H PRN Para Skeans, MD       [START ON 08/02/2021] sevelamer carbonate (RENVELA) tablet 1,600 mg  1,600 mg Oral BID WC Para Skeans, MD       Current Outpatient Medications  Medication Sig Dispense Refill   acetaminophen (TYLENOL) 325 MG tablet Take 650 mg by mouth every 4 (four) hours as needed for moderate pain.     carvedilol (COREG) 12.5 MG tablet Take 12.5 mg by mouth 2 (two) times daily with a meal. (Patient not taking: Reported on 07/01/2021)     cetirizine (ZYRTEC) 10 MG tablet Take 10 mg by mouth every morning.      Cholecalciferol (VITAMIN D) 50 MCG (2000 UT) tablet Take 2,000 Units by mouth every Monday, Wednesday, and Friday.     colestipol (COLESTID) 1 g tablet Take 2 g by mouth daily.     Fiber Diet TABS Take 2 tablets by mouth 2 (two) times daily.     gabapentin (NEURONTIN) 300 MG capsule Take 1 capsule (300 mg total) by mouth at bedtime. (Patient taking differently: Take 300 mg by mouth 2 (two) times daily.) 30 capsule 1   glucose blood test strip 1 each by Finger Stick route 4 times daily (after meals and nightly). Use as directed     HUMALOG 100 UNIT/ML injection Inject 4-12 Units into the skin 2 (two) times daily with a meal.     hydrocortisone (ANUSOL-HC) 2.5 % rectal cream Apply 1 application. topically daily as needed. (Patient not taking: Reported on 07/01/2021)     Hydrocortisone Acetate 2.5 % CREA Apply topically.     INSULIN SYRINGE 1CC/29G 29G X 1/2" 1 ML MISC      LANTUS SOLOSTAR 100 UNIT/ML Solostar Pen Inject 10 Units into the skin at bedtime.     lidocaine (LIDODERM) 5 % Place 1 patch onto the skin daily as needed (pain).     methocarbamol (ROBAXIN) 500 MG tablet Take 1 tablet (500 mg total) by mouth every 8 (eight) hours as needed for muscle spasms. (Patient not taking: Reported on 07/01/2021) 90 tablet 0   multivitamin (RENA-VIT) TABS tablet Take 1 tablet by mouth every morning.     ondansetron (ZOFRAN) 4 MG tablet Take 1 tablet (4 MG) before prep starts. Take 3 tablets (9 MG) as needed 6 hours after for nausea. 4 tablet 0   pantoprazole (PROTONIX) 40 MG tablet Take 1 tablet (40 mg total) by mouth daily. 30 tablet 0   sevelamer carbonate (RENVELA) 800 MG tablet Take 1,600 mg by mouth 2 (two) times daily with a meal.  SURE COMFORT PEN NEEDLES 31G X 8 MM MISC        Review of Systems Unable to perform ROS due to encephalopathy  Physical Exam Blood pressure (!) 148/112, pulse (!) 120, temperature 97.7 F (36.5 C), temperature source Axillary, resp. rate (!) 27, weight 49.9 kg, SpO2  95 %. CONSTITUTIONAL: Debilitated and malnourished pt somnolent, follows simple commands very slowly, clearly encephalopathic. EYES: Pupils are equal, round, and reactive to light, Sclera are non-icteric. EARS, NOSE, MOUTH AND THROAT: The oropharynx is clear. The oral mucosa is pink and moist. Hearing is intact to voice. LYMPH NODES:  Lymph nodes in the neck are normal. RESPIRATORY:  Lungs are decrease on bases. She is wearing oxygen  without pathologic use of accessory muscles. CARDIOVASCULAR: Heart is tachycardic, regular without murmurs, gallops, or rubs. GI: The abdomen is  soft, PD catheter in place w/o signs of infection, no Peritonitis or rebound. Some discomfort RUQ w/o Murphy sign but exam is limited due to encephalopathy.. There are no palpable masses. There is no hepatosplenomegaly.  GU: Rectal deferred.   MUSCULOSKELETAL: Normal muscle strength and tone. No cyanosis or edema.   SKIN: Turgor is good and there are no pathologic skin lesions or ulcers. NEUROLOGIC: Motor and sensation is grossly normal. Although given encephalopathy exam is limited, no Focal neurological deficits PSYCH:  Disoriented to  place and time. Somnolent, barely able to follow simple commands  Data Reviewed  I have personally reviewed the patient's imaging, laboratory findings and medical records.    Assessment/Plan 82 year old female with multiple medical issues including end-stage renal disease on peritoneal dialysis, congestive heart failure, peripheral vascular disease, chronic back pain, malnutrition, fragility and poor performance status now presents with acute cholecystitis and pancreatitis giving her a septic picture. She is not peritonitic and she does not have cholangitis.  Given the encephalopathy and only acute issues I think that is in her best interest to get appropriately resuscitated with IV fluids antibiotics and cholecystostomy tube.  She is at high risk for deterioration and she is at her high  risk for perioperative morbility and mortality.  I had an extensive discussion with the daughter who is in agreement with me.  I do not think if we were to operate on her she will wait Given her fragility encephalopathy and countless medical issues. We will be happy to continue to follow and I will go ahead and place the order for percutaneous cholecystostomy tube. Please note that I spent greater than 75 minutes in this encounter including coordination of her care, personally reviewing medical records, personally reviewing imaging studies, placing orders and performing appropriate documentation  Caroleen Hamman, MD FACS General Surgeon 08/01/2021, 10:37 PM

## 2021-08-01 NOTE — Assessment & Plan Note (Signed)
Per gen surg for IR to place cholecystostomy.

## 2021-08-01 NOTE — H&P (Signed)
History and Physical    Patient: Misty [name] HER:[id number] DOB: [birth year] DOA: 08/01/2021 DOS: the patient was seen and examined on 08/02/2021 PCP: Gayland Curry, MD  Patient coming from: Home  Chief Complaint:  Chief Complaint  Patient presents with   Altered Mental Status   HPI: Misty [name] is a 81 y.o. female with medical history significant of end-stage renal disease on peritoneal dialysis, cardiac, diastolic dysfunction congestive heart failure with a EF of 55 in 2022. Per EMS patient was altered for the past 2 days and has been having vomiting and not been eating prior to.  Patient does peritoneal dialysis every night. In the emergency room patient meets severe sepsis criteria and is tachycardic and dyspneic as a white count and lactic acid.  Patient underwent peritoneal dialysis fluid analysis which was negative for any neutrophils per ED MD report. Patient found to be hyperglycemic with a glucose of 662.  Patient serum creatinine of 4.2.  Lactic acid of 4.8.  CBC shows a white count of 15.9 , Lipase of 361.  Triglycerides of 355.  Lactic acid of 4.8 with a repeat of 5.1. I consulted general surgery for evaluation of patient's cholecystitis, cholangitis choledocholithiasis.  Dr. Dahlia Byes was at bedside with me to evaluate patient and patient well-appearing severe septic patient with peritoneal dialysis history of rectal cancer poor candidate for any surgical intervention at this time.  He discussed with daughter about CODE STATUS and daughter was crying and little nervous about Misty [name]'s wishes.  States that her dad will come tomorrow and that he will know and make the decision for Misty [name]'s CODE STATUS.  Patient initially was altered and nonverbal, b when Dr. Dahlia Byes arrived patient was verbal alert communicative.    Review of Systems: unable to review all systems due to the inability of the patient to answer questions. Past Medical History:  Diagnosis Date   Anemia of chronic  renal failure    Aortic valve stenosis 07/02/2017   a.) TTE 07/02/2017: 55-60%; mild AS with MPG 13.0 mmHg. b.) TTE 10/14/2019: EF >55%; MPG 23.0 mmHg. c.) TTE 06/23/2020: EF 50-55%; MPG 19.7 mmHg. d.) TTE 11/27/2020: EF >55%; MPG 18.0 mmHg.   Chronic lower back pain    Complication of anesthesia    a.) severe myalgias with succinylcholine use. b.) (+) difficult airway.   Diastolic dysfunction 85/63/1497   a.) TTE 07/02/2017; EF 55-60%; mild AR; G1DD. b.) TTE 06/23/2020: EF 50-55% with mild LVH; mild to mod LA enlargement; mild AR, mild to mod MR; G2DD.   Difficult airway for intubation    a.) 08/08/2020 at [facility] --> Initial attempt with Sabra Heck 3, larynx was "rigid/fixed"; unable to visualize anything but epiglottis. Glidescope utilized to obtain a great view with VL. First attempt with Glidescope unsuccessful d/t anterior orientation. ETT withdrawn and a greater bend was placed. Second attempt was successful with effort. Patient sustained lip laceration during intubation attempts.   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   a.) TTE 11/03/2019: measured 3.7 cm   ESRD on peritoneal dialysis (HCC)    GERD (gastroesophageal reflux disease)    Glaucoma    Heart murmur    HLD (hyperlipidemia)    Hyperparathyroidism ([place])    a.) s/p total parathyroidectomy   Hypertension    Hypertensive retinopathy    Lumbar radiculopathy    MGUS (monoclonal gammopathy of unknown significance)    Motion sickness    Nonproliferative diabetic retinopathy ([place])    Osteoarthritis    Peritoneal dialysis catheter in place (  [place])    Personal history of chemotherapy    Personal history of radiation therapy    Rectal cancer ([place]) 2011   a.) Clinical stage T3N0. b.) s/p 3 cycles of neoadjuvant CI 5FU + XRT c.) s/p resection on 08/08/2009.   Spinal stenosis of lumbar region with neurogenic claudication    T2DM (type 2 diabetes mellitus) ([place])    TIA (transient ischemic attack)    Past Surgical History:   Procedure Laterality Date   BACK SURGERY     BREAST BIOPSY Left    bx/ clip-neg   COLON SURGERY     COLONOSCOPY N/A 05/03/2020   Procedure: COLONOSCOPY;  Surgeon: Lesly Rubenstein, MD;  Location: [facility] ENDOSCOPY;  Service: Endoscopy;  Laterality: N/A;   COLONOSCOPY WITH PROPOFOL N/A 07/02/2021   Procedure: COLONOSCOPY WITH PROPOFOL;  Surgeon: [name], MD;  Location: [facility] ENDOSCOPY;  Service: Gastroenterology;  Laterality: N/A;   coloretcal cancer     DIALYSIS/PERMA CATHETER REMOVAL N/A 10/01/2020   Procedure: DIALYSIS/PERMA CATHETER REMOVAL;  Surgeon: Algernon Huxley, MD;  Location: [place] CV LAB;  Service: Cardiovascular;  Laterality: N/A;   EMBOLIZATION N/A 06/06/2021   Procedure: EMBOLIZATION;  Surgeon: Algernon Huxley, MD;  Location: [place] [facility] LAB;  Service: Cardiovascular;  Laterality: N/A;   LUMBAR LAMINECTOMY/DECOMPRESSION MICRODISCECTOMY Right 01/23/2021   Procedure: RIGHT L5-S1 DECOMPRESSION;  Surgeon: Meade Maw, MD;  Location: [facility] ORS;  Service: Neurosurgery;  Laterality: Right;   thyroid gland     parathyroid   TUBAL LIGATION     Social History:  reports that she has never smoked. She has never used smokeless tobacco. She reports that she does not drink alcohol and does not use drugs.  Allergies  Allergen Reactions   Succinylcholine Other (See Comments)    Severe myalgias   Penicillins Rash    Tolerated cefazolin prior    Family History  Problem Relation Age of Onset   Lung disease Sister    Diabetes Mellitus II Paternal Grandmother    Breast cancer Neg Hx     Prior to Admission medications   Medication Sig Start Date End Date Taking? Authorizing Provider  acetaminophen (TYLENOL) 325 MG tablet Take 650 mg by mouth every 4 (four) hours as needed for moderate pain.    [provider]  carvedilol (COREG) 12.5 MG tablet Take 12.5 mg by mouth 2 (two) times daily with a meal. Patient not taking: Reported on 07/01/2021    [provider]  cetirizine (ZYRTEC) 10 MG tablet Take 10 mg by mouth every morning.    [provider]  Cholecalciferol (VITAMIN D) 50 MCG (2000 UT) tablet Take 2,000 Units by mouth every Monday, Wednesday, and Friday.    [provider]  colestipol (COLESTID) 1 g tablet Take 2 g by mouth daily. 10/04/18 06/04/21  [provider]  Fiber Diet TABS Take 2 tablets by mouth 2 (two) times daily.    [provider]  gabapentin (NEURONTIN) 300 MG capsule Take 1 capsule (300 mg total) by mouth at bedtime. Patient taking differently: Take 300 mg by mouth 2 (two) times daily. 06/23/20   Lorella Nimrod, MD  glucose blood test strip 1 each by Finger Stick route 4 times daily (after meals and nightly). Use as directed 12/24/17   [provider]  HUMALOG 100 UNIT/ML injection Inject 4-12 Units into the skin 2 (two) times daily with a meal. 05/20/18   [provider]  hydrocortisone (ANUSOL-HC) 2.5 % rectal cream Apply  1 application. topically daily as needed. Patient not taking: Reported on 07/01/2021 06/03/21   [provider]  Hydrocortisone Acetate 2.5 % CREA Apply topically. 06/03/21   [provider]  INSULIN SYRINGE 1CC/29G 29G X 1/2" 1 ML MISC     [provider]  LANTUS SOLOSTAR 100 UNIT/ML Solostar Pen Inject 10 Units into the skin at bedtime. 07/12/21   [provider]  lidocaine (LIDODERM) 5 % Place 1 patch onto the skin daily as needed (pain).    [provider]  methocarbamol (ROBAXIN) 500 MG tablet Take 1 tablet (500 mg total) by mouth every 8 (eight) hours as needed for muscle spasms. Patient not taking: Reported on 07/01/2021 01/23/21   Loleta Dicker, PA  multivitamin (RENA-VIT) TABS tablet Take 1 tablet by mouth every morning. 05/22/20   [provider]  ondansetron (ZOFRAN) 4 MG tablet Take 1 tablet (4 MG) before prep starts. Take 3 tablets (9 MG) as needed 6 hours after for nausea. 07/01/21    [name], MD  pantoprazole (PROTONIX) 40 MG tablet Take 1 tablet (40 mg total) by mouth daily. 06/08/21 07/08/21  Nolberto Hanlon, MD  sevelamer carbonate (RENVELA) 800 MG tablet Take 1,600 mg by mouth 2 (two) times daily with a meal.    [provider]  SURE COMFORT PEN NEEDLES 31G X 8 MM [place]  08/28/18   [provider]    Physical Exam: Vitals:   08/01/21 2248 08/01/21 2248 08/01/21 2345 08/02/21 0000  BP:   114/70 (!) 134/96  Pulse:   (!) 122 (!) 127  Resp:   17 (!) 23  Temp:   98 F (36.7 C)   TempSrc:   Axillary   SpO2: 94% 94% 94% 93%  Weight:      Physical Exam Vitals reviewed.  Constitutional:      Appearance: She is ill-appearing.  HENT:     Head: Normocephalic and atraumatic.     Right Ear: External ear normal.     Left Ear: External ear normal.     Mouth/Throat:     Mouth: Mucous membranes are dry.  Eyes:     Extraocular Movements: Extraocular movements intact.     Pupils: Pupils are equal, round, and reactive to light.  Cardiovascular:     Rate and Rhythm: Normal rate and regular rhythm.     Pulses: Normal pulses.     Heart sounds: Normal heart sounds.  Pulmonary:     Effort: Pulmonary effort is normal.     Breath sounds: Rales present.  Abdominal:     General: Bowel sounds are normal. There is no distension.     Palpations: Abdomen is soft. There is no mass.     Tenderness: There is abdominal tenderness. There is no guarding.     Hernia: No hernia is present.  Musculoskeletal:     Right lower leg: No edema.     Left lower leg: No edema.  Neurological:     General: No focal deficit present.     Mental Status: She is confused.     Cranial Nerves: Cranial nerves 2-12 are intact.  Psychiatric:        Mood and Affect: Mood normal.        Speech: Speech normal.        Behavior: Behavior is cooperative.     Comments: Initially pt was nonverbal and after Dr. Dahlia Byes finished his exam pt started answering questions.  Pt was then alert,oriented and  smiling  and replying and communicating.     Data Reviewed: Results for orders placed or performed during the hospital encounter of 08/01/21 (from the past 24 hour(s))  Urinalysis, Routine w reflex microscopic     Status: Abnormal   Collection Time: 08/01/21  6:02 PM  Result Value Ref Range   Color, Urine YELLOW (A) YELLOW   APPearance HAZY (A) CLEAR   Specific Gravity, Urine 1.023 1.005 - 1.030   pH 6.0 5.0 - 8.0   Glucose, UA >=500 (A) NEGATIVE mg/dL   Hgb urine dipstick NEGATIVE NEGATIVE   Bilirubin Urine NEGATIVE NEGATIVE   Ketones, ur 5 (A) NEGATIVE mg/dL   Protein, ur >=300 (A) NEGATIVE mg/dL   Nitrite NEGATIVE NEGATIVE   Leukocytes,Ua NEGATIVE NEGATIVE   RBC / HPF 0-5 0 - 5 RBC/hpf   WBC, UA 0-5 0 - 5 WBC/hpf   Bacteria, UA RARE (A) NONE SEEN   Squamous Epithelial / LPF 0-5 0 - 5   Mucus PRESENT    Amorphous Crystal PRESENT   CBG monitoring, ED     Status: Abnormal   Collection Time: 08/01/21  6:03 PM  Result Value Ref Range   Glucose-Capillary 584 (HH) 70 - 99 mg/dL   Comment 1 Notify RN   Comprehensive metabolic panel     Status: Abnormal   Collection Time: 08/01/21  6:04 PM  Result Value Ref Range   Sodium 134 (L) 135 - 145 mmol/L   Potassium 3.5 3.5 - 5.1 mmol/L   Chloride 97 (L) 98 - 111 mmol/L   CO2 18 (L) 22 - 32 mmol/L   Glucose, Bld 662 (HH) 70 - 99 mg/dL   BUN 33 (H) 8 - 23 mg/dL   Creatinine, Ser 4.02 (H) 0.44 - 1.00 mg/dL   Calcium 9.4 8.9 - 10.3 mg/dL   Total Protein 6.5 6.5 - 8.1 g/dL   Albumin 2.4 (L) 3.5 - 5.0 g/dL   AST 38 15 - 41 U/L   ALT 15 0 - 44 U/L   Alkaline Phosphatase 107 38 - 126 U/L   Total Bilirubin 0.9 0.3 - 1.2 mg/dL   GFR, Estimated 11 (L) >60 mL/min   Anion gap 19 (H) 5 - 15  Lactic acid, plasma     Status: Abnormal   Collection Time: 08/01/21  6:04 PM  Result Value Ref Range   Lactic Acid, Venous 4.8 (HH) 0.5 - 1.9 mmol/L  CBC with Differential     Status: Abnormal   Collection Time: 08/01/21  6:04 PM  Result Value Ref  Range   WBC 15.9 (H) 4.0 - 10.5 K/uL   RBC 4.18 3.87 - 5.11 MIL/uL   Hemoglobin 12.6 12.0 - 15.0 g/dL   HCT 39.9 36.0 - 46.0 %   MCV 95.5 80.0 - 100.0 fL   MCH 30.1 26.0 - 34.0 pg   MCHC 31.6 30.0 - 36.0 g/dL   RDW 16.2 (H) 11.5 - 15.5 %   Platelets 422 (H) 150 - 400 K/uL   nRBC 0.0 0.0 - 0.2 %   Neutrophils Relative % 91 %   Neutro Abs 14.6 (H) 1.7 - 7.7 K/uL   Lymphocytes Relative 4 %   Lymphs Abs 0.6 (L) 0.7 - 4.0 K/uL   Monocytes Relative 4 %   Monocytes Absolute 0.6 0.1 - 1.0 K/uL   Eosinophils Relative 0 %   Eosinophils Absolute 0.0 0.0 - 0.5 K/uL   Basophils Relative 0 %   Basophils Absolute 0.0 0.0 - 0.1 K/uL  Immature Granulocytes 1 %   Abs Immature Granulocytes 0.12 (H) 0.00 - 0.07 K/uL  Protime-INR     Status: None   Collection Time: 08/01/21  6:04 PM  Result Value Ref Range   Prothrombin Time 13.8 11.4 - 15.2 seconds   INR 1.1 0.8 - 1.2  CBG monitoring, ED     Status: Abnormal   Collection Time: 08/01/21  6:04 PM  Result Value Ref Range   Glucose-Capillary 590 (HH) 70 - 99 mg/dL   Comment 1 Notify RN   Lipase, blood     Status: Abnormal   Collection Time: 08/01/21  6:04 PM  Result Value Ref Range   Lipase 361 (H) 11 - 51 U/L  Triglycerides     Status: Abnormal   Collection Time: 08/01/21  6:04 PM  Result Value Ref Range   Triglycerides 359 (H) <150 mg/dL  Lipid panel     Status: Abnormal   Collection Time: 08/01/21  6:04 PM  Result Value Ref Range   Cholesterol 270 (H) 0 - 200 mg/dL   Triglycerides 355 (H) <150 mg/dL   HDL 35 (L) >40 mg/dL   Total CHOL/HDL Ratio 7.7 RATIO   VLDL 71 (H) 0 - 40 mg/dL   LDL Cholesterol 164 (H) 0 - 99 mg/dL  Resp Panel by RT-PCR (Flu A&B, Covid) Anterior Nasal Swab     Status: None   Collection Time: 08/01/21  6:14 PM   Specimen: Anterior Nasal Swab  Result Value Ref Range   SARS Coronavirus 2 by RT PCR NEGATIVE NEGATIVE   Influenza A by PCR NEGATIVE NEGATIVE   Influenza B by PCR NEGATIVE NEGATIVE  Body fluid cell  count with differential     Status: Abnormal   Collection Time: 08/01/21  8:21 PM  Result Value Ref Range   Fluid Type-FCT Peritoneal    Color, Fluid COLORLESS (A) YELLOW   Appearance, Fluid CLEAR CLEAR   Total Nucleated Cell Count, Fluid 20 cu mm   Neutrophil Count, Fluid 0 %   Lymphs, Fluid 21 %   Monocyte-Macrophage-Serous Fluid 79 %   Eos, Fluid 0 %  Lactic acid, plasma     Status: Abnormal   Collection Time: 08/01/21  8:54 PM  Result Value Ref Range   Lactic Acid, Venous 5.1 (HH) 0.5 - 1.9 mmol/L     Assessment and Plan: * AMS (altered mental status) Attribute encephalopathy to her sepsis states and infection.  Pt is coming to with IVF and IV Abx.  We will cotn with supportive care.  Nephrology consult per am team.  Neuro check. Aspiration./ fall precaution.   Respiratory distress/ Acute respiratory failure (HCC) Increased oxygen and we will admit to stepdown. Received  ABG pending. Lasix 80 mg as pt still makes urine. Consulted nephrology for PD. Bipap as deemed appropriate.    Metabolic acidosis Pt has elevated AG metabolic acidosis and has combination of mild DKA, severe sepsis, lactic acidosis. We will start drip as her repeat sugar is still over 400.  Limit IVF bolus and use small bolus as needed.  Sodium bicarb if pH is less than 7.2. D/W MD with IVF and electrolyte replacement.    Acute cholecystitis Per gen surg for IR to place cholecystostomy.    Severe sepsis ([place]) Follow cultures and sensitivities.  Stepdown as pt is full code hypoxic and may require vasopressor support.  Abdominal pain Attribute to acute cholecystitis and pancreatitis.  PER Gen surg pt needs IR to place cholecystostomy.   Penicillin  allergy Tolerated cefepime.   ESRD on peritoneal dialysis [facility]) PDF analysis negative.  D/W Dr. Holley Raring about PD need and consult.    Hypertension Blood pressure (!) 146/113, pulse (!) 126, temperature 97.7 F (36.5 C), temperature source  Axillary, resp. rate (!) 23, weight 49.9 kg, SpO2 94 %. pt continue on coreg and hydralazine.    Diabetes mellitus ([place]) SSI per q 6 hours. pt npo until she is coherent and we can then do a bedside swallow.   Dvt prophylaxis: Heparin d/c for procedure in am.   Advance Care Planning:    Code Status: Full Code   Consults:  General surgery - Dr.Pabon.   Family Communication:  [phone] Phone)   [phone] (Mobile)  Severity of Illness: The appropriate patient status for this patient is INPATIENT. Inpatient status is judged to be reasonable and necessary in order to provide the required intensity of service to ensure the patient's safety. The patient's presenting symptoms, physical exam findings, and initial radiographic and laboratory data in the context of their chronic comorbidities is felt to place them at high risk for further clinical deterioration. Furthermore, it is not anticipated that the patient will be medically stable for discharge from the hospital within 2 midnights of admission.   * I certify that at the point of admission it is my clinical judgment that the patient will require inpatient hospital care spanning beyond 2 midnights from the point of admission due to high intensity of service, high risk for further deterioration and high frequency of surveillance required.*  Author: Para Skeans, MD 08/02/2021 1:42 AM  For on call review [email or web address].

## 2021-08-01 NOTE — ED Notes (Signed)
Patient transported to floor by RN with monitor at this time

## 2021-08-01 NOTE — Assessment & Plan Note (Signed)
SSI per q 6 hours. pt npo until she is coherent and we can then do a bedside swallow.

## 2021-08-01 NOTE — Assessment & Plan Note (Signed)
Tolerated cefepime.

## 2021-08-01 NOTE — Assessment & Plan Note (Signed)
Attribute to acute cholecystitis and pancreatitis.  PER Gen surg pt needs IR to place cholecystostomy.

## 2021-08-02 ENCOUNTER — Inpatient Hospital Stay: Payer: Medicare Other

## 2021-08-02 ENCOUNTER — Inpatient Hospital Stay: Payer: Medicare Other | Admitting: Radiology

## 2021-08-02 ENCOUNTER — Inpatient Hospital Stay
Admit: 2021-08-02 | Discharge: 2021-08-02 | Disposition: A | Discharge status: Home / Self Care | Payer: Medicare Other | Attending: Pulmonary Disease | Admitting: Pulmonary Disease

## 2021-08-02 DIAGNOSIS — K859 Acute pancreatitis without necrosis or infection, unspecified: Secondary | ICD-10-CM

## 2021-08-02 DIAGNOSIS — G9601 Cranial cerebrospinal fluid leak, spontaneous: Secondary | ICD-10-CM

## 2021-08-02 DIAGNOSIS — R0603 Acute respiratory distress: Secondary | ICD-10-CM | POA: Diagnosis present

## 2021-08-02 DIAGNOSIS — E872 Acidosis, unspecified: Secondary | ICD-10-CM | POA: Diagnosis present

## 2021-08-02 DIAGNOSIS — K810 Acute cholecystitis: Secondary | ICD-10-CM | POA: Diagnosis not present

## 2021-08-02 DIAGNOSIS — A419 Sepsis, unspecified organism: Secondary | ICD-10-CM

## 2021-08-02 HISTORY — PX: IR PERC CHOLECYSTOSTOMY: IMG2326

## 2021-08-02 LAB — HEPATIC FUNCTION PANEL
ALT: 16 U/L (ref 0–44)
AST: 70 U/L — ABNORMAL HIGH (ref 15–41)
Albumin: 2.2 g/dL — ABNORMAL LOW (ref 3.5–5.0)
Alkaline Phosphatase: 94 U/L (ref 38–126)
Bilirubin, Direct: 0.2 mg/dL (ref 0.0–0.2)
Indirect Bilirubin: 0.6 mg/dL (ref 0.3–0.9)
Total Bilirubin: 0.8 mg/dL (ref 0.3–1.2)
Total Protein: 5.9 g/dL — ABNORMAL LOW (ref 6.5–8.1)

## 2021-08-02 LAB — COMPREHENSIVE METABOLIC PANEL
ALT: 17 U/L (ref 0–44)
AST: 56 U/L — ABNORMAL HIGH (ref 15–41)
Albumin: 2.4 g/dL — ABNORMAL LOW (ref 3.5–5.0)
Alkaline Phosphatase: 93 U/L (ref 38–126)
Anion gap: 16 — ABNORMAL HIGH (ref 5–15)
BUN: 37 mg/dL — ABNORMAL HIGH (ref 8–23)
CO2: 24 mmol/L (ref 22–32)
Calcium: 9.6 mg/dL (ref 8.9–10.3)
Chloride: 105 mmol/L (ref 98–111)
Creatinine, Ser: 4.22 mg/dL — ABNORMAL HIGH (ref 0.44–1.00)
GFR, Estimated: 10 mL/min — ABNORMAL LOW (ref >60)
Glucose, Bld: 422 mg/dL — ABNORMAL HIGH (ref 70–99)
Potassium: 3.5 mmol/L (ref 3.5–5.1)
Sodium: 145 mmol/L (ref 135–145)
Total Bilirubin: 0.5 mg/dL (ref 0.3–1.2)
Total Protein: 6.1 g/dL — ABNORMAL LOW (ref 6.5–8.1)

## 2021-08-02 LAB — ECHOCARDIOGRAM COMPLETE
AR max vel: 1.37 cm2
AV Area VTI: 1.43 cm2
AV Area mean vel: 1.27 cm2
AV Mean grad: 10 mmHg
AV Peak grad: 18.3 mmHg
Ao pk vel: 2.14 m/s
Area-P 1/2: 7.66 cm2
Calc EF: 39.4 %
MV VTI: 3.7 cm2
P 1/2 time: 242 msec
S' Lateral: 3.1 cm
Single Plane A2C EF: 23.3 %
Single Plane A4C EF: 49.8 %
Weight: 1717.82 oz

## 2021-08-02 LAB — GLUCOSE, CAPILLARY
Glucose-Capillary: 579 mg/dL (ref 70–99)
Glucose-Capillary: 539 mg/dL (ref 70–99)
Glucose-Capillary: 423 mg/dL — ABNORMAL HIGH (ref 70–99)
Glucose-Capillary: 424 mg/dL — ABNORMAL HIGH (ref 70–99)
Glucose-Capillary: 359 mg/dL — ABNORMAL HIGH (ref 70–99)
Glucose-Capillary: 376 mg/dL — ABNORMAL HIGH (ref 70–99)
Glucose-Capillary: 316 mg/dL — ABNORMAL HIGH (ref 70–99)
Glucose-Capillary: 270 mg/dL — ABNORMAL HIGH (ref 70–99)
Glucose-Capillary: 173 mg/dL — ABNORMAL HIGH (ref 70–99)
Glucose-Capillary: 277 mg/dL — ABNORMAL HIGH (ref 70–99)
Glucose-Capillary: 305 mg/dL — ABNORMAL HIGH (ref 70–99)
Glucose-Capillary: 231 mg/dL — ABNORMAL HIGH (ref 70–99)
Glucose-Capillary: 151 mg/dL — ABNORMAL HIGH (ref 70–99)
Glucose-Capillary: 127 mg/dL — ABNORMAL HIGH (ref 70–99)
Glucose-Capillary: 154 mg/dL — ABNORMAL HIGH (ref 70–99)
Glucose-Capillary: 206 mg/dL — ABNORMAL HIGH (ref 70–99)
Glucose-Capillary: 230 mg/dL — ABNORMAL HIGH (ref 70–99)

## 2021-08-02 LAB — BLOOD GAS, ARTERIAL
Acid-base deficit: 15.5 mmol/L — ABNORMAL HIGH (ref 0.0–2.0)
Bicarbonate: 8.7 mmol/L — ABNORMAL LOW (ref 20.0–28.0)
O2 Content: 6 L/min
O2 Saturation: 93.1 %
Patient temperature: 37
pCO2 arterial: 18 mmHg — CL (ref 32–48)
pH, Arterial: 7.29 — ABNORMAL LOW (ref 7.35–7.45)
pO2, Arterial: 70 mmHg — ABNORMAL LOW (ref 83–108)

## 2021-08-02 LAB — RESPIRATORY PANEL BY PCR

## 2021-08-02 LAB — BASIC METABOLIC PANEL
Anion gap: 22 — ABNORMAL HIGH (ref 5–15)
Anion gap: 14 (ref 5–15)
Anion gap: 10 (ref 5–15)
Anion gap: 15 (ref 5–15)
BUN: 36 mg/dL — ABNORMAL HIGH (ref 8–23)
BUN: 36 mg/dL — ABNORMAL HIGH (ref 8–23)
BUN: 36 mg/dL — ABNORMAL HIGH (ref 8–23)
BUN: 37 mg/dL — ABNORMAL HIGH (ref 8–23)
CO2: 12 mmol/L — ABNORMAL LOW (ref 22–32)
CO2: 26 mmol/L (ref 22–32)
CO2: 26 mmol/L (ref 22–32)
CO2: 24 mmol/L (ref 22–32)
Calcium: 9.3 mg/dL (ref 8.9–10.3)
Calcium: 9.6 mg/dL (ref 8.9–10.3)
Calcium: 9.3 mg/dL (ref 8.9–10.3)
Calcium: 9.3 mg/dL (ref 8.9–10.3)
Chloride: 102 mmol/L (ref 98–111)
Chloride: 107 mmol/L (ref 98–111)
Chloride: 108 mmol/L (ref 98–111)
Chloride: 109 mmol/L (ref 98–111)
Creatinine, Ser: 4.39 mg/dL — ABNORMAL HIGH (ref 0.44–1.00)
Creatinine, Ser: 4.14 mg/dL — ABNORMAL HIGH (ref 0.44–1.00)
Creatinine, Ser: 4.08 mg/dL — ABNORMAL HIGH (ref 0.44–1.00)
Creatinine, Ser: 4.19 mg/dL — ABNORMAL HIGH (ref 0.44–1.00)
GFR, Estimated: 10 mL/min — ABNORMAL LOW (ref >60)
GFR, Estimated: 10 mL/min — ABNORMAL LOW (ref >60)
GFR, Estimated: 11 mL/min — ABNORMAL LOW (ref >60)
GFR, Estimated: 10 mL/min — ABNORMAL LOW (ref >60)
Glucose, Bld: 677 mg/dL (ref 70–99)
Glucose, Bld: 315 mg/dL — ABNORMAL HIGH (ref 70–99)
Glucose, Bld: 244 mg/dL — ABNORMAL HIGH (ref 70–99)
Glucose, Bld: 145 mg/dL — ABNORMAL HIGH (ref 70–99)
Potassium: 3.5 mmol/L (ref 3.5–5.1)
Potassium: 3.4 mmol/L — ABNORMAL LOW (ref 3.5–5.1)
Potassium: 3.5 mmol/L (ref 3.5–5.1)
Potassium: 3.7 mmol/L (ref 3.5–5.1)
Sodium: 136 mmol/L (ref 135–145)
Sodium: 147 mmol/L — ABNORMAL HIGH (ref 135–145)
Sodium: 144 mmol/L (ref 135–145)
Sodium: 148 mmol/L — ABNORMAL HIGH (ref 135–145)

## 2021-08-02 LAB — LACTIC ACID, PLASMA
Lactic Acid, Venous: >9 mmol/L (ref 0.5–1.9)
Lactic Acid, Venous: 0.8 mmol/L (ref 0.5–1.9)
Lactic Acid, Venous: 7.7 mmol/L (ref 0.5–1.9)
Lactic Acid, Venous: 4.7 mmol/L (ref 0.5–1.9)
Lactic Acid, Venous: 3.6 mmol/L (ref 0.5–1.9)

## 2021-08-02 LAB — STREP PNEUMONIAE URINARY ANTIGEN: Strep Pneumo Urinary Antigen: NEGATIVE

## 2021-08-02 LAB — PATHOLOGIST SMEAR REVIEW

## 2021-08-02 LAB — TROPONIN I (HIGH SENSITIVITY)
Troponin I (High Sensitivity): 8290 ng/L (ref <18)
Troponin I (High Sensitivity): 5589 ng/L (ref <18)

## 2021-08-02 LAB — BLOOD GAS, VENOUS
Acid-base deficit: 0.8 mmol/L (ref 0.0–2.0)
Bicarbonate: 25.4 mmol/L (ref 20.0–28.0)
O2 Saturation: 29.7 %
Patient temperature: 37
pCO2, Ven: 47 mmHg (ref 44–60)
pH, Ven: 7.34 (ref 7.25–7.43)
pO2, Ven: <31 mmHg — CL (ref 32–45)

## 2021-08-02 LAB — CBC
HCT: 32.9 % — ABNORMAL LOW (ref 36.0–46.0)
Hemoglobin: 10.2 g/dL — ABNORMAL LOW (ref 12.0–15.0)
MCH: 29.8 pg (ref 26.0–34.0)
MCHC: 31 g/dL (ref 30.0–36.0)
MCV: 96.2 fL (ref 80.0–100.0)
Platelets: 386 10*3/uL (ref 150–400)
RBC: 3.42 MIL/uL — ABNORMAL LOW (ref 3.87–5.11)
RDW: 16.4 % — ABNORMAL HIGH (ref 11.5–15.5)
WBC: 17.6 10*3/uL — ABNORMAL HIGH (ref 4.0–10.5)
nRBC: 0 % (ref 0.0–0.2)

## 2021-08-02 LAB — PHOSPHORUS: Phosphorus: 7.4 mg/dL — ABNORMAL HIGH (ref 2.5–4.6)

## 2021-08-02 LAB — GAMMA GT: GGT: 37 U/L (ref 7–50)

## 2021-08-02 LAB — BETA-HYDROXYBUTYRIC ACID
Beta-Hydroxybutyric Acid: 0.97 mmol/L — ABNORMAL HIGH (ref 0.05–0.27)
Beta-Hydroxybutyric Acid: 0.13 mmol/L (ref 0.05–0.27)
Beta-Hydroxybutyric Acid: 0.09 mmol/L (ref 0.05–0.27)

## 2021-08-02 LAB — MAGNESIUM
Magnesium: 2.3 mg/dL (ref 1.7–2.4)
Magnesium: 2.5 mg/dL — ABNORMAL HIGH (ref 1.7–2.4)

## 2021-08-02 LAB — BRAIN NATRIURETIC PEPTIDE: B Natriuretic Peptide: >4500 pg/mL — ABNORMAL HIGH (ref 0.0–100.0)

## 2021-08-02 LAB — MRSA NEXT GEN BY PCR, NASAL: MRSA by PCR Next Gen: NOT DETECTED

## 2021-08-02 LAB — AMMONIA: Ammonia: 24 umol/L (ref 9–35)

## 2021-08-02 IMAGING — XA IR CHOLECYSTOSTOMY
1 series · 14 of 15 positions shown · non-contrast
Comparison: none

INDICATION: Acute calculus cholecystitis with sepsis

[Series 1: vasc extremity · 6 acquisitions, 14 frames shown]
[im 1/6]
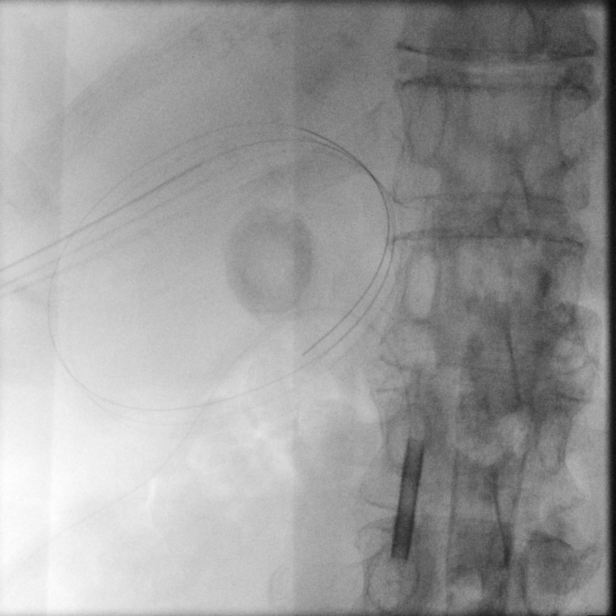
[im 2/6]
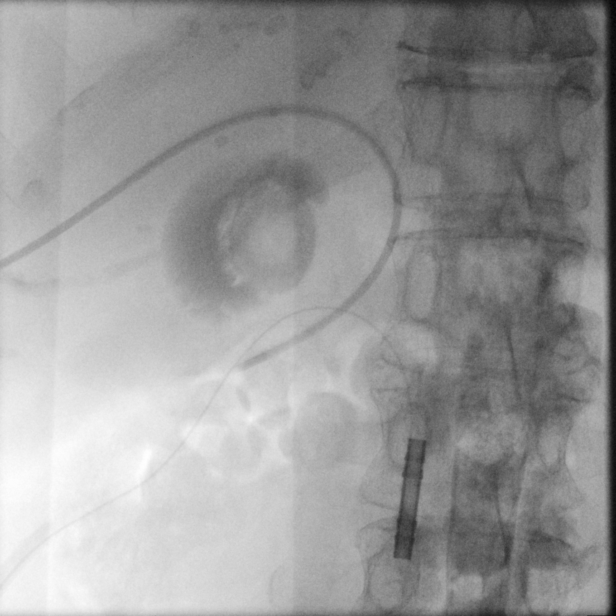
[im 3/6]
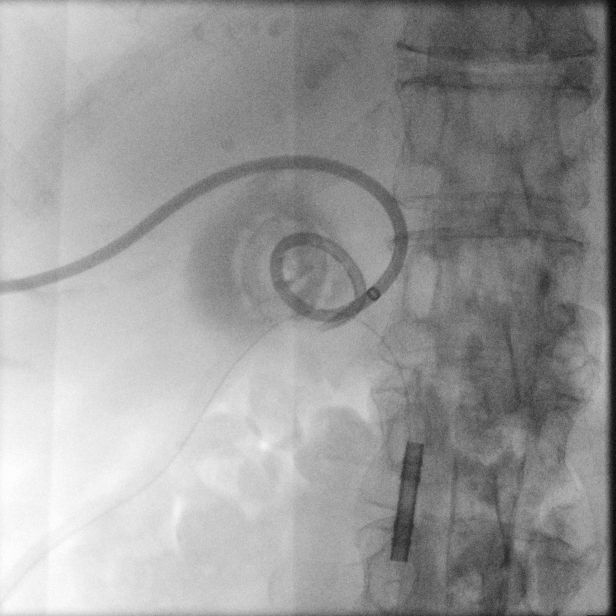
[im 3/6]
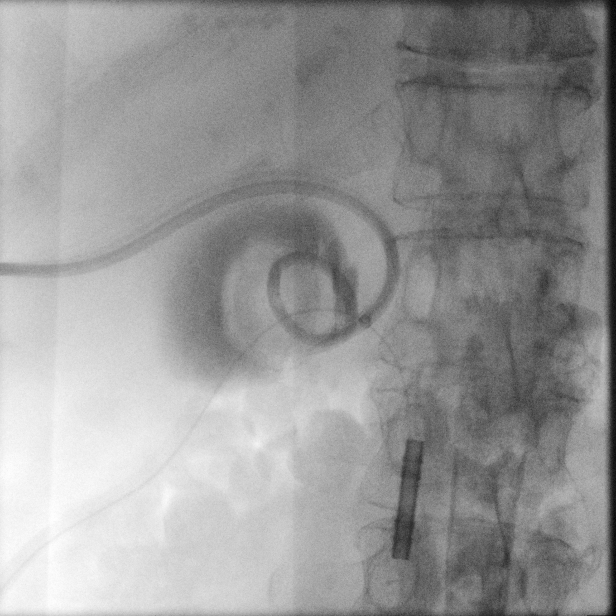
[im 3/6]
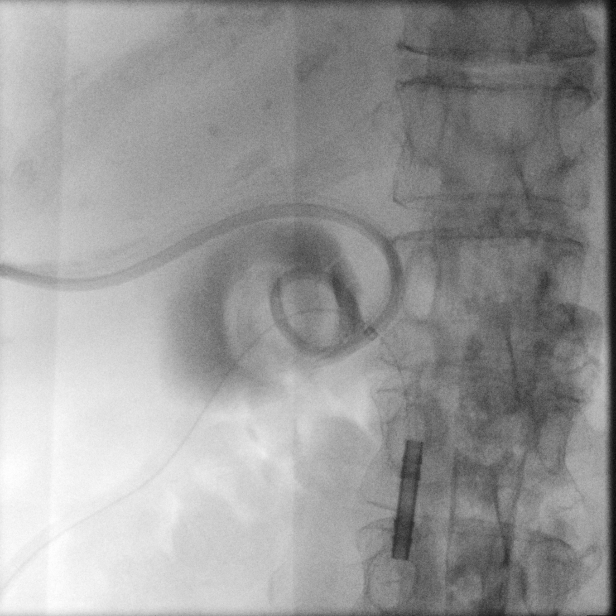
[im 3/6]
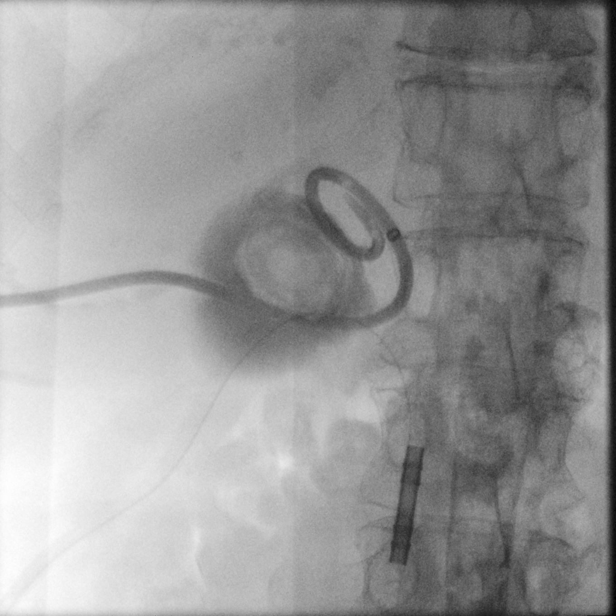
[im 4/6]
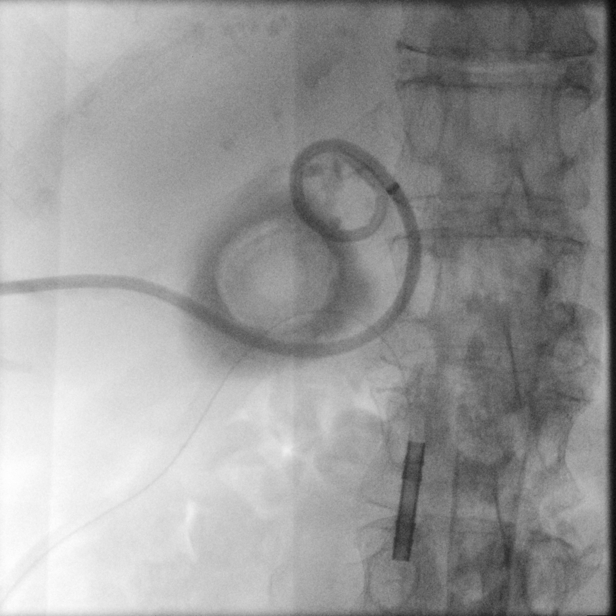
[im 4/6]
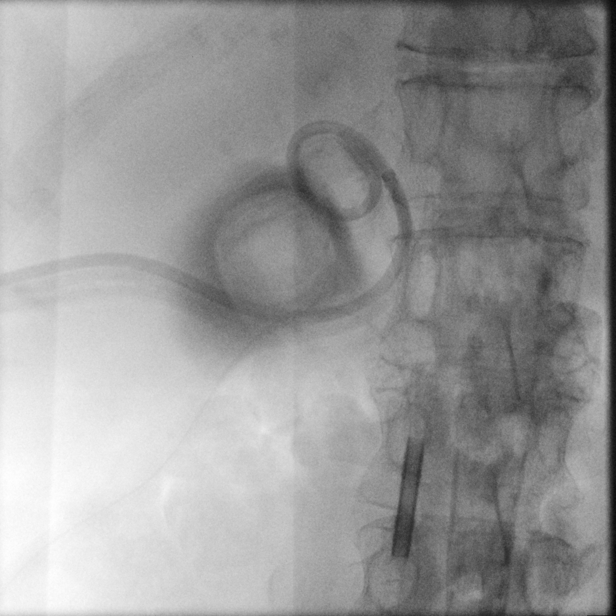
[im 4/6]
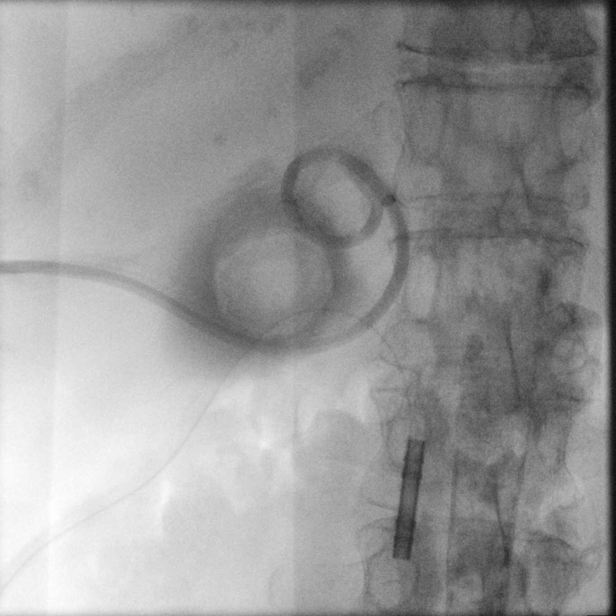
[im 5/6]
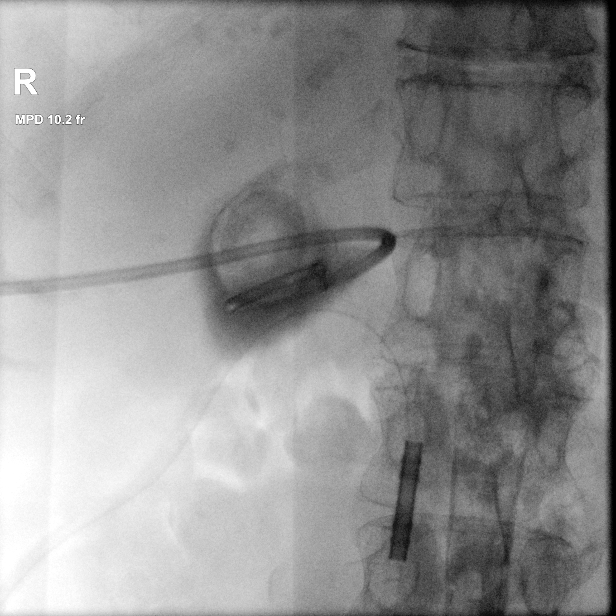
[im 6/6]
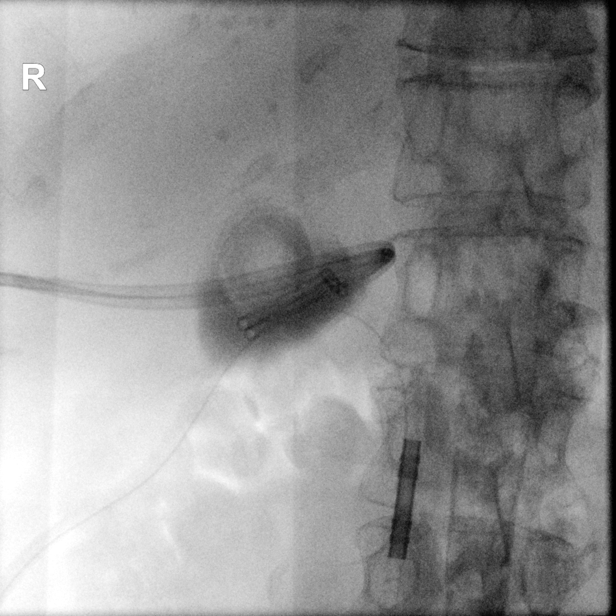
[im 6/6]
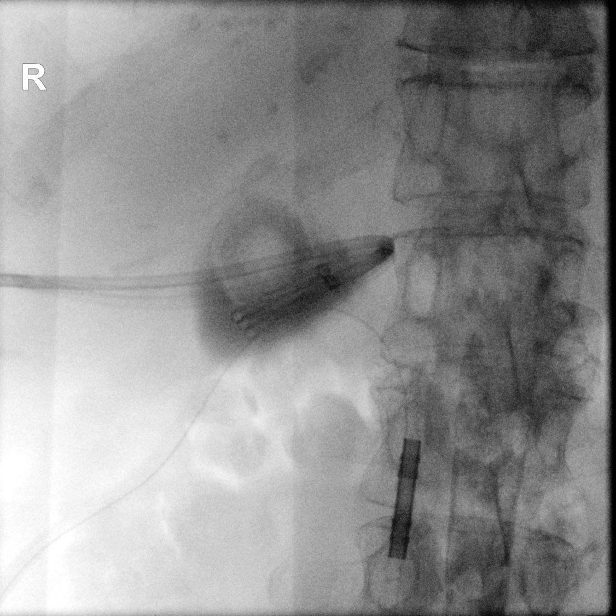
[im 6/6]
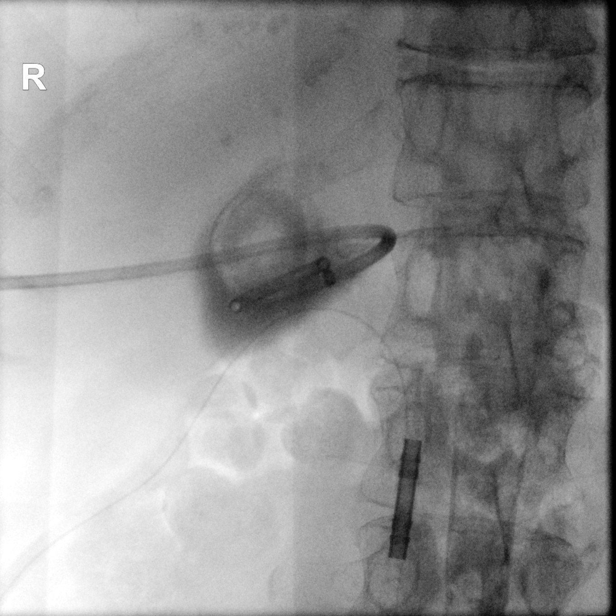
[im 6/6]
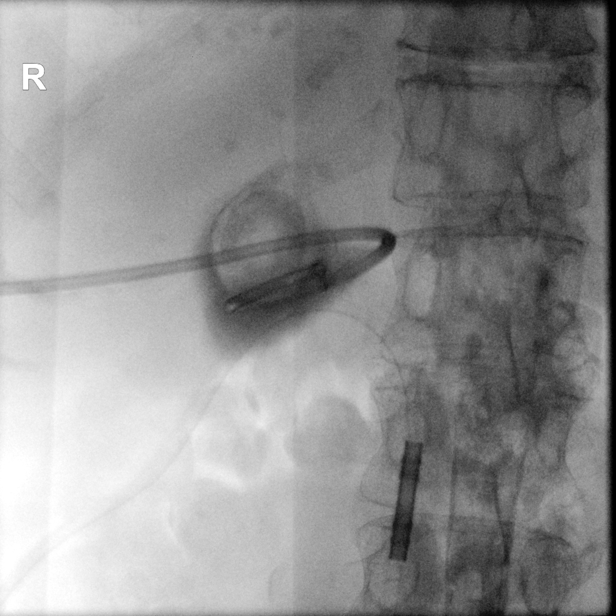

[14 of 15 positions shown; findings below may reference images not displayed]

EXAM:
Placement of percutaneous cholecystostomy tube using ultrasound and
fluoroscopic guidance

MEDICATIONS:
Per EMR

ANESTHESIA/SEDATION:
Local analgesia

FLUOROSCOPY:
Radiation Exposure Index (as provided by the fluoroscopic device):
1.3 minutes (18 mGy)

COMPLICATIONS:
None immediate.

PROCEDURE:
Informed written consent was obtained from the patient after a
thorough discussion of the procedural risks, benefits and
alternatives. All questions were addressed. Maximal Sterile Barrier
Technique was utilized including caps, mask, sterile gowns, sterile
gloves, sterile drape, hand hygiene and skin antiseptic. A timeout
was performed prior to the initiation of the procedure.

The patient was placed supine on the exam table. The right upper
quadrant was prepped and draped in the standard sterile fashion.
Ultrasound of the right upper quadrant was performed for planning
purposes. This again demonstrated distended gallbladder with large
gallstone in a dependent position. An intercostal transhepatic
approach was planned. Skin entry site was marked, and local
analgesia was obtained with 1% lidocaine. Under ultrasound guidance,
percutaneous access was obtained into the gallbladder via an
intercostal transhepatic approach using a 21 gauge Chiba needle.
Access was confirmed with visualization of needle tip within the
gallbladder lumen, and free return of bile. An 018 Nitrex wire was
then advanced through the access needle and coiled within the
gallbladder lumen. A transition dilator was advanced over this wire,
through which an antegrade cholecystogram was performed. Antegrade
cholecystogram demonstrated appropriate location in the gallbladder
lumen and gallbladder sludge with a large gallstone. Over an Amplatz
wire, the percutaneous tract was serially dilated followed by
placement of a 10 French locking multipurpose drainage catheter into
the gallbladder lumen. Locking loop was formed. Additional biliary
sludge was drained. Gentle hand injection of contrast material
confirmed location of the gallbladder lumen. The drainage catheter
was secured to the skin using silk suture and a dressing. It was
placed to bag drainage. The patient tolerated the procedure well
without immediate complication.
IMPRESSION: Successful placement of a 10 French percutaneous cholecystostomy
drainage catheter via an intercostal transhepatic approach. Drain
placed to bag drainage. Further follow-up recommendations per
surgery. If drain remains after 6 weeks, patient to [REDACTED] for routine catheter care.

## 2021-08-02 IMAGING — US IR CHOLECYSTOSTOMY
1 series · 4 of 4 positions shown · non-contrast
Comparison: none

INDICATION: Acute calculus cholecystitis with sepsis

[Series 1: ir cholecystostomy · 0.26mm/px · 4 of 4 slices shown]
[im 1/4]
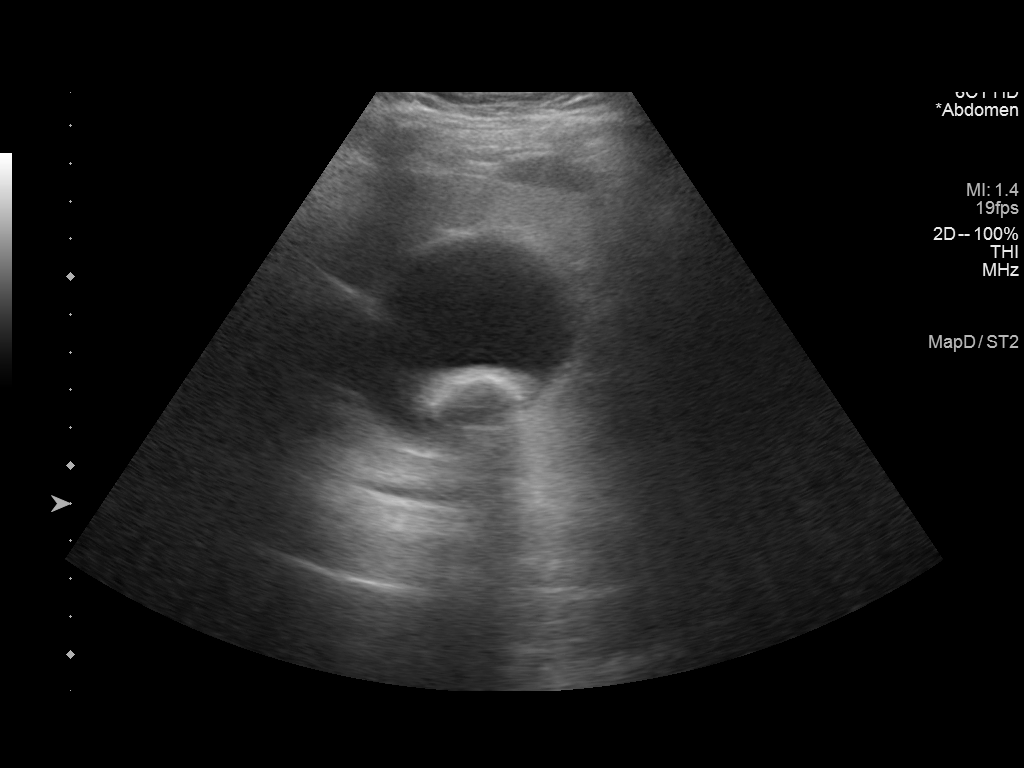
[im 2/4]
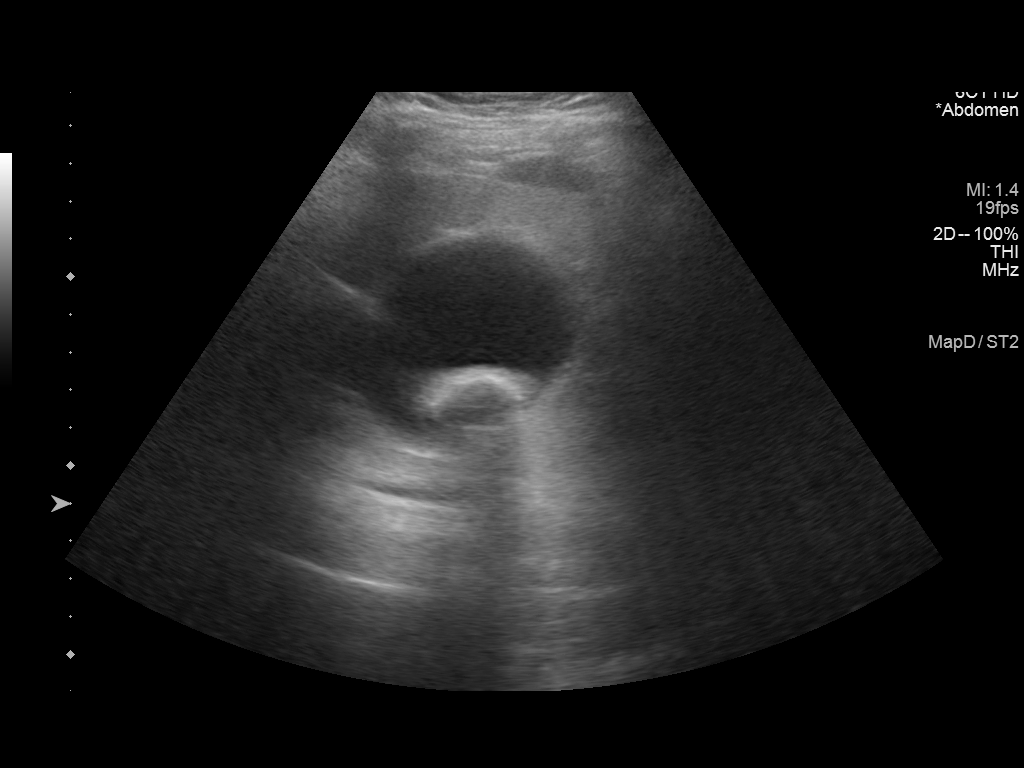
[im 3/4]
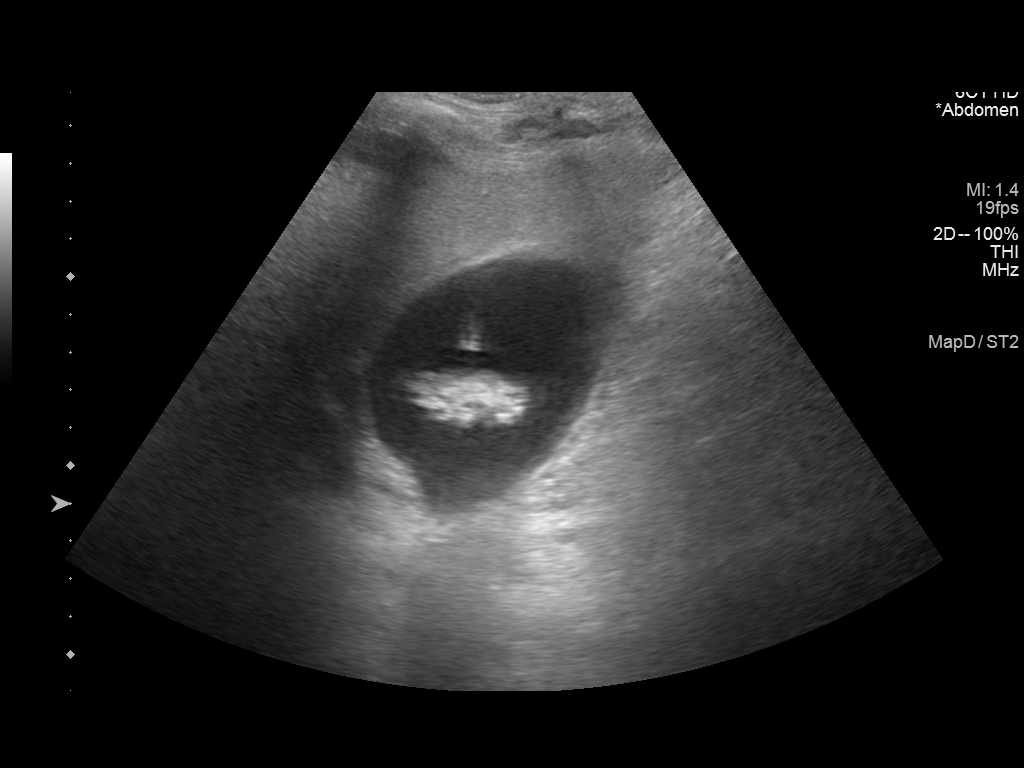
[im 4/4]
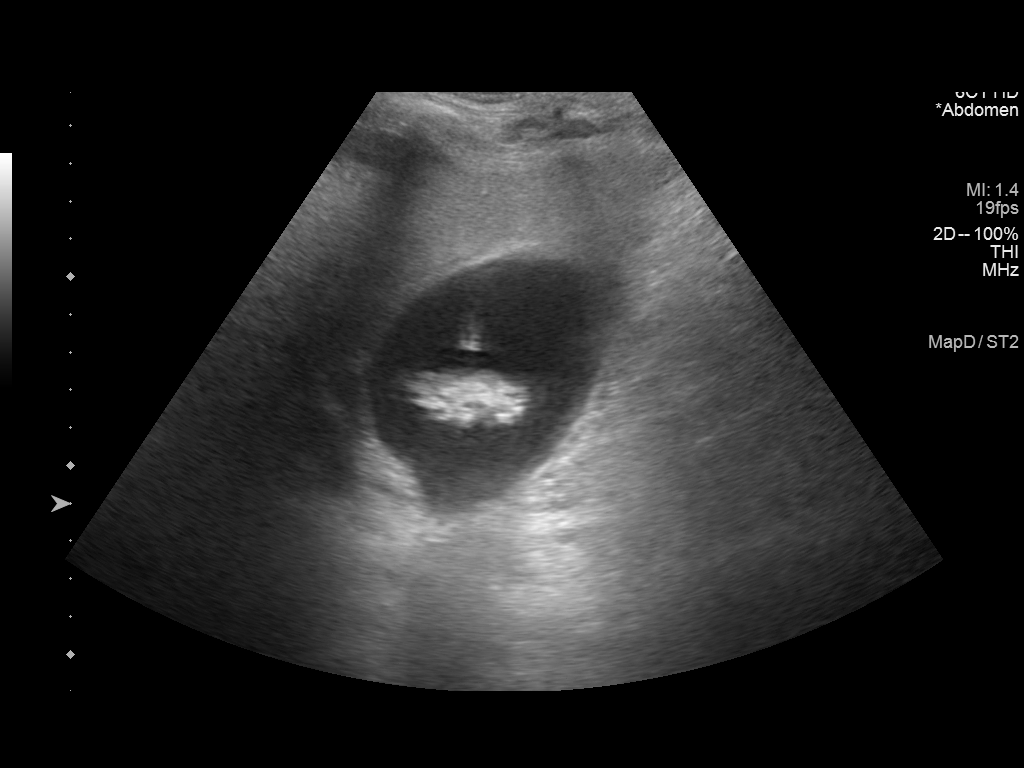

[4 of 4 positions shown; findings below may reference images not displayed]

EXAM:
Placement of percutaneous cholecystostomy tube using ultrasound and
fluoroscopic guidance

MEDICATIONS:
Per EMR

ANESTHESIA/SEDATION:
Local analgesia

FLUOROSCOPY:
Radiation Exposure Index (as provided by the fluoroscopic device):
1.3 minutes (18 mGy)

COMPLICATIONS:
None immediate.

PROCEDURE:
Informed written consent was obtained from the patient after a
thorough discussion of the procedural risks, benefits and
alternatives. All questions were addressed. Maximal Sterile Barrier
Technique was utilized including caps, mask, sterile gowns, sterile
gloves, sterile drape, hand hygiene and skin antiseptic. A timeout
was performed prior to the initiation of the procedure.

The patient was placed supine on the exam table. The right upper
quadrant was prepped and draped in the standard sterile fashion.
Ultrasound of the right upper quadrant was performed for planning
purposes. This again demonstrated distended gallbladder with large
gallstone in a dependent position. An intercostal transhepatic
approach was planned. Skin entry site was marked, and local
analgesia was obtained with 1% lidocaine. Under ultrasound guidance,
percutaneous access was obtained into the gallbladder via an
intercostal transhepatic approach using a 21 gauge Chiba needle.
Access was confirmed with visualization of needle tip within the
gallbladder lumen, and free return of bile. An 018 Nitrex wire was
then advanced through the access needle and coiled within the
gallbladder lumen. A transition dilator was advanced over this wire,
through which an antegrade cholecystogram was performed. Antegrade
cholecystogram demonstrated appropriate location in the gallbladder
lumen and gallbladder sludge with a large gallstone. Over an Amplatz
wire, the percutaneous tract was serially dilated followed by
placement of a 10 French locking multipurpose drainage catheter into
the gallbladder lumen. Locking loop was formed. Additional biliary
sludge was drained. Gentle hand injection of contrast material
confirmed location of the gallbladder lumen. The drainage catheter
was secured to the skin using silk suture and a dressing. It was
placed to bag drainage. The patient tolerated the procedure well
without immediate complication.
IMPRESSION: Successful placement of a 10 French percutaneous cholecystostomy
drainage catheter via an intercostal transhepatic approach. Drain
placed to bag drainage. Further follow-up recommendations per
surgery. If drain remains after 6 weeks, patient to [REDACTED] for routine catheter care.

## 2021-08-02 MED ORDER — INSULIN ASPART 100 UNIT/ML IJ SOLN
0.0000 [IU] | INTRAMUSCULAR | Status: DC
  Administered 2021-08-03: 3 [IU] via SUBCUTANEOUS
  Administered 2021-08-03 (×2): 2 [IU] via SUBCUTANEOUS
  Administered 2021-08-03 (×2): 3 [IU] via SUBCUTANEOUS
  Administered 2021-08-04 (×2): 2 [IU] via SUBCUTANEOUS
  Filled 2021-08-02 (×7): qty 1

## 2021-08-02 MED ORDER — FUROSEMIDE 10 MG/ML IJ SOLN
40.0000 mg | Freq: Once | INTRAMUSCULAR | Status: AC
Start: 2021-08-02 — End: 2021-08-02
  Administered 2021-08-02: 40 mg via INTRAVENOUS
  Filled 2021-08-02: qty 4

## 2021-08-02 MED ORDER — FENTANYL CITRATE (PF) 100 MCG/2ML IJ SOLN
INTRAMUSCULAR | Status: AC
  Filled 2021-08-02: qty 2

## 2021-08-02 MED ORDER — DEXMEDETOMIDINE HCL IN NACL 400 MCG/100ML IV SOLN
INTRAVENOUS | Status: AC
Start: 2021-08-02 — End: 2021-08-02
  Filled 2021-08-02: qty 100

## 2021-08-02 MED ORDER — METOPROLOL TARTRATE 5 MG/5ML IV SOLN
2.5000 mg | Freq: Once | INTRAVENOUS | Status: AC
  Administered 2021-08-02: 2.5 mg via INTRAVENOUS
  Filled 2021-08-02: qty 5

## 2021-08-02 MED ORDER — IOHEXOL 350 MG/ML SOLN
50.0000 mL | Freq: Once | INTRAVENOUS | Status: AC | PRN
  Administered 2021-08-02: 10 mL

## 2021-08-02 MED ORDER — DEXTROSE 50 % IV SOLN: 0.0000 mL | INTRAVENOUS | Status: DC | PRN

## 2021-08-02 MED ORDER — HEPARIN (PORCINE) 25000 UT/250ML-% IV SOLN
600.0000 [IU]/h | INTRAVENOUS | Status: DC
  Administered 2021-08-02: 600 [IU]/h via INTRAVENOUS
  Filled 2021-08-02: qty 250

## 2021-08-02 MED ORDER — LACTATED RINGERS IV SOLN: INTRAVENOUS | Status: DC

## 2021-08-02 MED ORDER — ALBUTEROL SULFATE (2.5 MG/3ML) 0.083% IN NEBU: 2.5000 mg | INHALATION_SOLUTION | RESPIRATORY_TRACT | Status: DC | PRN

## 2021-08-02 MED ORDER — ORAL CARE MOUTH RINSE
15.0000 mL | Freq: Two times a day (BID) | OROMUCOSAL | Status: DC
  Administered 2021-08-02 – 2021-08-03 (×4): 15 mL via OROMUCOSAL

## 2021-08-02 MED ORDER — DELFLEX-LC/2.5% DEXTROSE 394 MOSM/L IP SOLN
INTRAPERITONEAL | Status: DC
  Administered 2021-08-02 (×2): 6000 mL via INTRAPERITONEAL
  Filled 2021-08-02 (×2): qty 3000

## 2021-08-02 MED ORDER — SODIUM BICARBONATE 8.4 % IV SOLN: 150.0000 meq | Freq: Once | INTRAVENOUS | Status: DC

## 2021-08-02 MED ORDER — HEPARIN BOLUS VIA INFUSION
3000.0000 [IU] | Freq: Once | INTRAVENOUS | Status: AC
  Administered 2021-08-02: 3000 [IU] via INTRAVENOUS
  Filled 2021-08-02: qty 3000

## 2021-08-02 MED ORDER — ASPIRIN 300 MG RE SUPP
300.0000 mg | Freq: Once | RECTAL | Status: AC
  Administered 2021-08-02: 300 mg via RECTAL
  Filled 2021-08-02: qty 1

## 2021-08-02 MED ORDER — DEXTROSE IN LACTATED RINGERS 5 % IV SOLN: INTRAVENOUS | Status: DC

## 2021-08-02 MED ORDER — LIDOCAINE HCL 1 % IJ SOLN
INTRAMUSCULAR | Status: AC
Start: 2021-08-02 — End: 2021-08-02
  Administered 2021-08-02: 10 mL
  Filled 2021-08-02: qty 20

## 2021-08-02 MED ORDER — SODIUM CHLORIDE 0.9 % IV SOLN
250.0000 mL | INTRAVENOUS | Status: DC
Start: 2021-08-02 — End: 2021-08-04
  Administered 2021-08-02 – 2021-08-04 (×3): 250 mL via INTRAVENOUS

## 2021-08-02 MED ORDER — INSULIN REGULAR(HUMAN) IN NACL 100-0.9 UT/100ML-% IV SOLN
INTRAVENOUS | Status: DC
  Administered 2021-08-02: 6.5 [IU]/h via INTRAVENOUS
  Filled 2021-08-02: qty 100

## 2021-08-02 MED ORDER — GENTAMICIN SULFATE 0.1 % EX CREA
1.0000 "application " | TOPICAL_CREAM | Freq: Every day | CUTANEOUS | Status: DC
  Administered 2021-08-02 – 2021-08-03 (×2): 1 via TOPICAL
  Filled 2021-08-02: qty 15

## 2021-08-02 MED ORDER — NOREPINEPHRINE 4 MG/250ML-% IV SOLN
INTRAVENOUS | Status: AC
  Filled 2021-08-02: qty 250

## 2021-08-02 MED ORDER — SODIUM BICARBONATE 8.4 % IV SOLN
100.0000 meq | Freq: Once | INTRAVENOUS | Status: AC
  Administered 2021-08-02: 100 meq via INTRAVENOUS
  Filled 2021-08-02: qty 100

## 2021-08-02 MED ORDER — NOREPINEPHRINE 4 MG/250ML-% IV SOLN
2.0000 ug/min | INTRAVENOUS | Status: DC
  Administered 2021-08-02: 4 ug/min via INTRAVENOUS

## 2021-08-02 MED ORDER — FUROSEMIDE 10 MG/ML IJ SOLN
40.0000 mg | Freq: Once | INTRAMUSCULAR | Status: AC
  Administered 2021-08-02: 40 mg via INTRAVENOUS

## 2021-08-02 MED ORDER — INSULIN ASPART 100 UNIT/ML IJ SOLN
0.0000 [IU] | INTRAMUSCULAR | Status: DC
  Administered 2021-08-02: 2 [IU] via SUBCUTANEOUS
  Filled 2021-08-02: qty 1

## 2021-08-02 MED ORDER — POTASSIUM CHLORIDE 10 MEQ/100ML IV SOLN
10.0000 meq | INTRAVENOUS | Status: AC
  Administered 2021-08-02 (×2): 10 meq via INTRAVENOUS
  Filled 2021-08-02 (×2): qty 100

## 2021-08-02 MED ORDER — FUROSEMIDE 10 MG/ML IJ SOLN
INTRAMUSCULAR | Status: AC
Start: 2021-08-02 — End: 2021-08-02
  Filled 2021-08-02: qty 4

## 2021-08-02 MED ORDER — DEXMEDETOMIDINE HCL IN NACL 400 MCG/100ML IV SOLN
0.4000 ug/kg/h | INTRAVENOUS | Status: DC
  Administered 2021-08-02: 0.4 ug/kg/h via INTRAVENOUS
  Administered 2021-08-02: 1 ug/kg/h via INTRAVENOUS
  Filled 2021-08-02: qty 100

## 2021-08-02 MED ORDER — INSULIN DETEMIR 100 UNIT/ML ~~LOC~~ SOLN
5.0000 [IU] | Freq: Two times a day (BID) | SUBCUTANEOUS | Status: DC
Start: 2021-08-02 — End: 2021-08-04
  Administered 2021-08-02 – 2021-08-03 (×3): 5 [IU] via SUBCUTANEOUS
  Filled 2021-08-02 (×4): qty 0.05

## 2021-08-02 MED ORDER — PANTOPRAZOLE SODIUM 40 MG IV SOLR
40.0000 mg | Freq: Every day | INTRAVENOUS | Status: DC
  Administered 2021-08-02 – 2021-08-03 (×2): 40 mg via INTRAVENOUS
  Filled 2021-08-02 (×2): qty 10

## 2021-08-02 MED ORDER — FENTANYL CITRATE (PF) 100 MCG/2ML IJ SOLN
INTRAMUSCULAR | Status: DC | PRN
  Administered 2021-08-02: 25 ug via INTRAVENOUS

## 2021-08-02 MED ORDER — FUROSEMIDE 10 MG/ML IJ SOLN: 40.0000 mg | Freq: Once | INTRAMUSCULAR | Status: DC

## 2021-08-02 NOTE — Progress Notes (Signed)
Inpatient Diabetes Program Recommendations  AACE/ADA: New Consensus Statement on Inpatient Glycemic Control (2015)  Target Ranges:  Prepandial:   less than 140 mg/dL      Peak postprandial:   less than 180 mg/dL (1-2 hours)      Critically ill patients:  140 - 180 mg/dL   Lab Results  Component Value Date   GLUCAP 173 (H) 08/02/2021   HGBA1C 6.6 (H) 06/04/2021    Review of Glycemic Control  Diabetes history: DM 2 Outpatient Diabetes medications: Lantus 10 units qhs, Humalog 4-12 units bid Current orders for Inpatient glycemic control:  IV insulin/Endotool/metabolic acidosis  W8Z 9.9% on 06/04/21  Inpatient Diabetes Program Recommendations:    At time of transition consider:  -  Levemir 5 units bid -  Novolog 1-3 units Q4 hours (ICU glycemic control order set)  Thanks,  Tama Headings RN, MSN, BC-ADM Inpatient Diabetes Coordinator Team Pager [phone] (8a-5p)

## 2021-08-02 NOTE — Progress Notes (Signed)
CC: sepsis  Subjective: Remains septic with hyperosmolar coma was transferred to the ICU.  Currently on Levophed Persistent acidosis and persistent encephalopathy  Objective: Vital signs in last 24 hours: Temp:  [97.7 F (36.5 C)-100 F (37.8 C)] 98 F (36.7 C) (05/26 0715) Pulse Rate:  [86-144] 97 (05/26 0800) Resp:  [12-31] 25 (05/26 0800) BP: (72-178)/(43-147) 118/54 (05/26 0800) SpO2:  [88 %-100 %] 100 % (05/26 0800) Weight:  [48.7 kg-51.5 kg] 48.7 kg (05/26 0715) Last BM Date :  (PTA)  Intake/Output from previous day: 05/25 0701 - 05/26 0700 In: 2379.8 [I.V.:76.2; IV Piggyback:2103.6] Out: 300 [Urine:100] Intake/Output this shift: No intake/output data recorded.  Physical exam: She Is debilitated and somnolent Abd: soft, non tender exam unreliable due to encephalopathy. PD catheter in place without infection, no peritonitis   Lab Results: CBC  Recent Labs    08/01/21 1804 08/02/21 0400  WBC 15.9* 17.6*  HGB 12.6 10.2*  HCT 39.9 32.9*  PLT 422* 386   BMET Recent Labs    08/02/21 0115 08/02/21 0511  NA 136 145  K 3.5 3.5  CL 102 105  CO2 12* 24  GLUCOSE 677* 422*  BUN 36* 37*  CREATININE 4.39* 4.22*  CALCIUM 9.3 9.6   PT/INR Recent Labs    08/01/21 1804  LABPROT 13.8  INR 1.1   ABG Recent Labs    08/02/21 0053 08/02/21 0522  PHART 7.29*  --   HCO3 8.7* 25.4    Studies/Results: CT ABDOMEN PELVIS WO CONTRAST  Result Date: 08/01/2021 CLINICAL DATA:  Nausea and vomiting EXAM: CT ABDOMEN AND PELVIS WITHOUT CONTRAST TECHNIQUE: Multidetector CT imaging of the abdomen and pelvis was performed following the standard protocol without IV contrast. RADIATION DOSE REDUCTION: This exam was performed according to the departmental dose-optimization program which includes automated exposure control, adjustment of the mA and/or kV according to patient size and/or use of iterative reconstruction technique. COMPARISON:  CTA abdomen and pelvis 06/05/2021  FINDINGS: Lower chest: Small right and trace left pleural effusions. Hazy and irregular densities in the lower lungs likely represent subsegmental atelectasis and possibly infiltrates, greater on the right. Hepatobiliary: Liver is normal in size with no focal mass identified. Gallbladder is significantly distended and contains a 2.6 cm calculus. No significant gallbladder wall thickening or surrounding inflammatory changes. No biliary ductal dilatation appreciated. Pancreas: Mild peripancreatic fat stranding at the tail the pancreas. No pancreatic ductal dilatation. Spleen: Normal size with several tiny calcified granulomas. Adrenals/Urinary Tract: Adrenal glands appear normal. Kidneys are atrophic with scattered likely vascular calcifications. No definite nephrolithiasis or hydronephrosis identified bilaterally. Urinary bladder appears normal. Stomach/Bowel: Tiny hiatal hernia. No bowel obstruction, free air or pneumatosis. No bowel wall edema identified. No evidence of acute appendicitis. Vascular/Lymphatic: Severe atherosclerotic disease including large nearly occlusive appearing calcified plaque in the mid abdominal aorta. No bulky lymphadenopathy identified. Reproductive: Small uterus with calcifications. Other: Small volume ascites mostly in the upper abdomen. Stable loculated fluid collection in the right pelvis which appears chronic and likely postsurgical in nature. Stable chronic area of fat necrosis in the mid abdomen near the level of the umbilicus. A percutaneous catheter is again seen with the tip coiled in the pelvis. Musculoskeletal: Advanced degenerative changes of the spine. No suspicious bony lesions visualized. IMPRESSION: 1. Mild peripancreatic edema which could be secondary to mild acute pancreatitis or related to the small volume ascites. Correlate clinically. 2. Cholelithiasis and markedly distended gallbladder. No gallbladder wall thickening or surrounding inflammatory changes visualized.  Correlate clinically  and consider follow-up ultrasound if indicated. 3. Small right pleural effusion and trace left pleural effusion. Associated subsegmental atelectatic changes and possibly infiltrates in the lower lungs. 4. Severe atherosclerotic disease. 5. Other chronic findings as described. Electronically Signed   By: Ofilia Neas M.D.   On: 08/01/2021 19:57   CT Head Wo Contrast  Result Date: 08/01/2021 CLINICAL DATA:  Mental status change of unknown cause beginning 2 days ago. Vomiting. EXAM: CT HEAD WITHOUT CONTRAST TECHNIQUE: Contiguous axial images were obtained from the base of the skull through the vertex without intravenous contrast. RADIATION DOSE REDUCTION: This exam was performed according to the departmental dose-optimization program which includes automated exposure control, adjustment of the mA and/or kV according to patient size and/or use of iterative reconstruction technique. COMPARISON:  MRI 07/26/2021.  CT 06/04/2021 FINDINGS: Brain: No acute finding. Age related atrophy. Chronic small-vessel ischemic changes of the white matter. 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 No sign of acute infarction, mass lesion, hemorrhage, hydrocephalus or extra-axial collection. Vascular: There is atherosclerotic calcification of the major vessels at the base of the brain. Skull: Negative Sinuses/Orbits: Clear/normal Other: None IMPRESSION: No acute finding. Atrophy and chronic small-vessel ischemic changes as above, similar to previous exams. Electronically Signed   By: Nelson Chimes M.D.   On: 08/01/2021 18:50   DG Chest Portable 1 View  Result Date: 08/01/2021 CLINICAL DATA:  Altered mental status sepsis EXAM: PORTABLE CHEST 1 VIEW COMPARISON:  06/21/2020 FINDINGS: Borderline cardiac size. Vascular congestion. Heterogeneous asymmetric right greater than left interstitial and ground-glass opacity. No pleural effusion or pneumothorax. Aortic atherosclerosis. IMPRESSION:  Asymmetric heterogeneous right greater than left interstitial and hazy pulmonary density which may be due to asymmetric edema or diffuse infection to include atypical process Electronically Signed   By: Donavan Foil M.D.   On: 08/01/2021 19:11   US ABDOMEN LIMITED RUQ (LIVER/GB)  Result Date: 08/01/2021 CLINICAL DATA:  epigastric pain, sepsis EXAM: ULTRASOUND ABDOMEN LIMITED RIGHT UPPER QUADRANT COMPARISON:  CT 08/01/2021 FINDINGS: Gallbladder: Dilated gallbladder with large intraluminal stone which measures up to 2.6 cm. No wall thickening or pericholecystic fluid. Reportedly positive sonographic Murphy sign. Common bile duct: Diameter: 4.6 mm, normal.  No intrahepatic biliary ductal dilation. Liver: Diffusely increased liver echogenicity. Portal vein is patent on color Doppler imaging with normal direction of blood flow towards the liver. Other: Right pleural effusion. IMPRESSION: Dilated gallbladder with large intraluminal stone measuring up to 2.6 cm as seen on recent CT. No wall thickening or pericholecystic fluid. Reportedly positive sonographic Murphy sign, which is nonspecific in the setting of suspected pancreatitis. Correlate with exam and laboratory findings. Diffuse hepatic steatosis. Electronically Signed   By: Maurine Simmering M.D.   On: 08/01/2021 21:37    Anti-infectives: Anti-infectives (From admission, onward)    Start     Dose/Rate Route Frequency Ordered Stop   08/02/21 1900  ceFEPIme (MAXIPIME) 1 g in sodium chloride 0.9 % 100 mL IVPB        1 g 200 mL/hr over 30 Minutes Intravenous Every 24 hours 08/01/21 2224     08/02/21 0700  metroNIDAZOLE (FLAGYL) IVPB 500 mg        500 mg 100 mL/hr over 60 Minutes Intravenous Every 12 hours 08/01/21 2200     08/01/21 1900  ceFEPIme (MAXIPIME) 2 g in sodium chloride 0.9 % 100 mL IVPB        2 g 200 mL/hr over 30 Minutes Intravenous  Once 08/01/21 1853 08/01/21 2000  08/01/21 1900  metroNIDAZOLE (FLAGYL) IVPB 500 mg        500 mg 100 mL/hr  over 60 Minutes Intravenous  Once 08/01/21 1853 08/01/21 2047       Assessment/Plan:  Sepsis review acidosis without any clear-cut findings.  Presumably cholecystitis but pneumonia within differentials. Do not recommend surgical interventions as I think she will not survive surgery Recommend cholecystostomy tube once hemodynamics are more appropriate Discussed with family in detail again about goals of care.  They still want to do full code They do understand that surgical intervention would not be in her best interest Case d/w IR in detail May need to switch to HD as a more controllable way of dialysis and given HD compromise I spent 50 minutes in this encounter including coordination of care, placing orders, counseling the family.    Caroleen Hamman, MD, [facility]  08/02/2021

## 2021-08-02 NOTE — Consult Note (Addendum)
NAME:  Misty [name], MRN:  [id number], DOB:  [birth year], LOS: 1 ADMISSION DATE:  08/01/2021, CONSULTATION DATE:  08/02/21 REFERRING MD:  Dr. Girard Cooter, CHIEF COMPLAINT:  Altered Mental Status   History of Present Illness:  81 yo F presenting to [facility] ED from home where she lives with her husband via EMS on 08/01/21 with complaints of altered mental status for the last 2 days with vomiting. Per husband and daughter patient is normally independent of all ADL's and fully oriented until 08/01/21, however, she has been declining this year with the need for a walker/wheelchair use due to frequent falls. Daughter also reports patient has had poor PO intake lately. They deny any additional complaints. Per report she has been compliant with peritoneal dialysis. ED course: Per ED documentation, upon arrival patient with upper abdominal pain, vomiting and altered mental status. Patient met sepsis criteria and protocol started. Lab work revealed leukocytosis, lactic acidosis, AGMA, hyperglycemia, elevated lipase and cholesterol panel. Minimal ketones in urine, but findings still concerning for HHS/ mild DKA. Dr. Dahlia Byes with general surgery consulted for acute cholecystitis and pancreatitis with recommendation for percutaneous cholecystostomy tube. Medications given: cefepime & flagyl, insulin 10 units, 1.3 L LR Initial Vitals: 100 F, tachypneic- 25, 125-tachycardia, hypertensive- 173/77 & SpO2 94% on 3 L [place]. Significant labs: (Labs/ Imaging personally reviewed) I, Domingo Pulse [place], AGACNP-BC, personally viewed and interpreted this ECG. EKG Interpretation: Date: 08/02/21, EKG Time: 01:11, Rate: 89, Rhythm: ST, QRS Axis:  LAD, Intervals: LAFB (unchanged since previous), ST/T Wave abnormalities: none, Narrative Interpretation: ST with LAD and LAFB Chemistry: Na+: 134, Cl: 97, K+: 3.5, BUN/Cr.: 33/4.02, Serum CO2/ AG: 18/19, lipase: 361, Glucose: 662 Lipid panel: cholesterol: 270, HDL: 35, LDL: 164,  triglycerides: 359, VLDL: 71 Hematology: WBC: 15.9, Hgb: 12.6, plt: 422  Lactic/ PCT: 4.8> 5.1, COVID-19 & Influenza A/B: negative  CXR 08/01/21: Asymmetric heterogeneous R > L interstitial and hazy pulmonary density which may be due to asymmetric edema or diffus infection to include atypical process. CT head wo contrast 08/01/21: no acute finding. Atrophy and chronic small vessel ischemic changes as above, similar to previous exams. CT abdomen/pelvis wo contrast 08/01/21: Mild peripancreatic edema which could be secondary to mild acute pancreatitis or related to the small volume ascites. Correlate clinically. Cholelithiasis and markedly distended gallbladder. No gallbladder wall thickening or surrounding inflammatory changes visualized. Correlate clinically and consider follow-up ultrasound if indicated. Small right pleural effusion and trace left pleural effusion. Associated subsegmental atelectatic changes and possibly infiltrates in the lower lungs. Severe atherosclerotic disease. US abdomen limited RUQ 08/01/21: dilated gallbladder with large intraluminal stone measuring up to 2.6 cm as seen on recent CT. No wall thickening or pericholecystic fluid. Reportedly positive murphy sign, which is non specific in the setting of suspected pancreatitis. Diffuse hepatic steatosis.  Upon arrival to SDU patient became suddenly anxious & altered with shortness of breath, tachycardia in the 150's & tachypneic. PCCM consulted for assistance with management due to agitation and respiratory distress.  Pertinent  Medical History  T2DM Multiple Myeloma HTN Rectal Cancer Glaucoma ESRD on PD Difficult airway Heart murmur MGUS Dilatation of thoracic aorta TIA Spinal stenosis of lumbar region with neurogenic claudication Significant Hospital Events: Including procedures, antibiotic start and stop dates in addition to other pertinent events   08/01/21: admit to SDU   Interim History / Subjective:  Upon arrival  bedside, daughter present and patient visibly distressed. Breath sounds coarse crackles on auscultation, patient confirms feeling short of breath. HR sustaining >  140 bpm, hypertensive in the 190's. Discussed with Dr. Posey Pronto, recommended lasix IV, ABG, insulin drip, precedex drip and BIPAP if needed - discussed plan of care with daughter bedside, all questions and concerns answered at this time.  Objective   Blood pressure (!) 134/96, pulse (!) 127, temperature 98 F (36.7 C), temperature source Axillary, resp. rate (!) 23, weight 49.9 kg, SpO2 93 %.        Intake/Output Summary (Last 24 hours) at 08/02/2021 0036 Last data filed at 08/01/2021 2151 Gross per 24 hour  Intake 1200 ml  Output 200 ml  Net 1000 ml   Filed Weights   08/01/21 1759  Weight: 49.9 kg    Examination: General: Adult female, critically ill, thrashing in bed, agitated and distressed HEENT: MM pink/moist, anicteric, atraumatic, neck supple Neuro: RASS +3, purposeful mvmt but too agitated to follow commands, PERRL +3, MAE- generalized weakness CV: s1s2 Regular, tachycardic >140's, ST on monitor, no r/m/g Pulm: Regular, tachypneic & labored on HFNC 10 L, breath sounds coarse crackles-BUL & diminished-BLL GI: soft, rounded, non tender, bs x 4 GU: pure wick in place Skin: scattered ecchymosis & abrasions Extremities: warm/dry, pulses + 2 R/P, no edema noted  Resolved Hospital Problem list     Assessment & Plan:  Acute Hypoxic Respiratory Failure secondary to acute pulmonary edema - Continue HFNC overnight, wean FiO2 as tolerated - Supplemental O2 to maintain SpO2 > 90% - ABG STAT - BIPAP PRN, trying to hold off due to agitation - 80 mg lasix given - Ensure adequate pulmonary hygiene  - Bronchodilators PRN  Severe Sepsis without septic shock due to suspected acute cholecystitis & pancreatitis Lactic: 4.8 - 5.1 - >9.0 Initial interventions/workup included: 1.3 L of NS/LR & Cefepime/ Metronidazole - will f/u  lactic post interventions > if still severely elevated consider f/u CT abdomen  - f/u cultures, trend lactic (PCT not useful due to renal failure) - Daily CBC, monitor WBC/ fever curve - IV antibiotics: cefepime & flagyl - general surgery consulted, plan for IR 5/26 AM for percutaneous drain placement - monitor hepatic fxn, lipase & amylase  Hyperglycemia secondary to mild Diabetic ketoacidosis vs HHS Worsening AGMA PMHx: T2DM - Hyperglycemia protocol initiated > will hold off on continuous IVF due to HFpEF exacerbation and tenuous respiratory status, reassess when CBG drops < 250 - 2 amps of sodium bicarbonate given as AGMA has worsened - Insulin drip ordered per Endo tool protocol - Strict I/O's: alert provider if UOP < 0.5 mL/kg/hr - Q 4 BMP, closely monitor potassium, replace electrolytes PRN - monitor blood glucose every 1 hour, per Endo tool protocol - trend serum CO2/ AG/ Lactic, f/u A1C - Diabetes coordinator consult - Resume home insulin regiment once appropriate  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 STAT BNP: >4,500 - Echocardiogram ordered - Continuous cardiac monitoring  - Strict I/O's: alert provider if UOP < 0.5 mL/kg/hr - Daily weights to assess volume status - Diurese with the use of IV lasix  - Supplemental oxygen as needed, maintain SpO2 > 90% - Consider NPPV with BIPAP thereapy as needed   ESRD on PD - Strict I/O's: alert provider if UOP < 0.5 mL/kg/hr - Daily BMP, replace electrolytes PRN - Avoid nephrotoxic agents as able, ensure adequate renal perfusion - Nephrology following, appreciate input > plan for PD at 6 am 5/26 - 80 mg of lasix given, see above  Acute Encephalopathy suspect acute delirium multifocal in the setting of severe sepsis, acidosis and severe hyperglycemia -supportive care -  precedex drip ordered, wean as tolerated  - frequent neuro checks  - consider MRI if confusion does not improve  Best Practice (right click and "Reselect all  SmartList Selections" daily)  Diet/type: NPO DVT prophylaxis: SCD ( heparin on hold pre-procedure) GI prophylaxis: PPI Lines: N/A Foley:  Yes, and it is still needed Code Status:  full code Last date of multidisciplinary goals of care discussion [08/02/21]  Labs   CBC: Recent Labs  Lab 08/01/21 1804  WBC 15.9*  NEUTROABS 14.6*  HGB 12.6  HCT 39.9  MCV 95.5  PLT 422*    Basic Metabolic Panel: Recent Labs  Lab 08/01/21 1804  NA 134*  K 3.5  CL 97*  CO2 18*  GLUCOSE 662*  BUN 33*  CREATININE 4.02*  CALCIUM 9.4   GFR: Estimated Creatinine Clearance: 9.1 mL/min (A) (by C-G formula based on SCr of 4.02 mg/dL (H)). Recent Labs  Lab 08/01/21 1804 08/01/21 2054  WBC 15.9*  --   LATICACIDVEN 4.8* 5.1*    Liver Function Tests: Recent Labs  Lab 08/01/21 1804  AST 38  ALT 15  ALKPHOS 107  BILITOT 0.9  PROT 6.5  ALBUMIN 2.4*   Recent Labs  Lab 08/01/21 1804  LIPASE 361*   No results for input(s): AMMONIA in the last 168 hours.  ABG    Component Value Date/Time   TCO2 25 01/23/2021 0657     Coagulation Profile: Recent Labs  Lab 08/01/21 1804  INR 1.1    Cardiac Enzymes: No results for input(s): CKTOTAL, CKMB, CKMBINDEX, TROPONINI in the last 168 hours.  HbA1C: Hgb A1c MFr Bld  Date/Time Value Ref Range Status  06/04/2021 02:28 PM 6.6 (H) 4.8 - 5.6 % Final    Comment:    (NOTE) Pre diabetes:          5.7%-6.4%  Diabetes:              >6.4%  Glycemic control for   <7.0% adults with diabetes   06/22/2020 07:52 AM 6.7 (H) 4.8 - 5.6 % Final    Comment:    (NOTE)         Prediabetes: 5.7 - 6.4         Diabetes: >6.4         Glycemic control for adults with diabetes: <7.0     CBG: Recent Labs  Lab 08/01/21 1803 08/01/21 1804 08/01/21 2255 08/01/21 2342  GLUCAP 584* 590* 533* 487*    Review of Systems:   UTA- patient with AMS and agitation unable to participate in full interview. She did admit to shortness of breath upon  questioning.  Past Medical History:  She,  has a past medical history of Anemia of chronic renal failure, Aortic valve stenosis (07/02/2017), Chronic lower back pain, Complication of anesthesia, Diastolic dysfunction (53/61/4431), Difficult airway for intubation, Dilatation of thoracic aorta ([place]) (11/03/2019), ESRD on peritoneal dialysis ([place]), GERD (gastroesophageal reflux disease), Glaucoma, Heart murmur, HLD (hyperlipidemia), Hyperparathyroidism ([place]), Hypertension, Hypertensive retinopathy, Lumbar radiculopathy, MGUS (monoclonal gammopathy of unknown significance), Motion sickness, Nonproliferative diabetic retinopathy ([place]), Osteoarthritis, Peritoneal dialysis catheter in place [facility]), Personal history of chemotherapy, Personal history of radiation therapy, Rectal cancer ([place]) (2011), Spinal stenosis of lumbar region with neurogenic claudication, T2DM (type 2 diabetes mellitus) ([place]), and TIA (transient ischemic attack).   Surgical History:   Past Surgical History:  Procedure Laterality Date   BACK SURGERY     BREAST BIOPSY Left    bx/ clip-neg   COLON SURGERY  COLONOSCOPY N/A 05/03/2020   Procedure: COLONOSCOPY;  Surgeon: Lesly Rubenstein, MD;  Location[facility] ENDOSCOPY;  Service: Endoscopy;  Laterality: N/A;   COLONOSCOPY WITH PROPOFOL N/A 07/02/2021   Procedure: COLONOSCOPY WITH PROPOFOL;  Surgeon: [name], MD;  Location: [facility] ENDOSCOPY;  Service: Gastroenterology;  Laterality: N/A;   coloretcal cancer     DIALYSIS/PERMA CATHETER REMOVAL N/A 10/01/2020   Procedure: DIALYSIS/PERMA CATHETER REMOVAL;  Surgeon: Algernon Huxley, MD;  Location: [place] CV LAB;  Service: Cardiovascular;  Laterality: N/A;   EMBOLIZATION N/A 06/06/2021   Procedure: EMBOLIZATION;  Surgeon: Algernon Huxley, MD;  Location: [place] CV LAB;  Service: Cardiovascular;  Laterality: N/A;   LUMBAR LAMINECTOMY/DECOMPRESSION MICRODISCECTOMY Right 01/23/2021   Procedure: RIGHT L5-S1 DECOMPRESSION;  Surgeon:  Meade Maw, MD;  Location: [facility] ORS;  Service: Neurosurgery;  Laterality: Right;   thyroid gland     parathyroid   TUBAL LIGATION       Social History:   reports that she has never smoked. She has never used smokeless tobacco. She reports that she does not drink alcohol and does not use drugs.   Family History:  Her family history includes Diabetes Mellitus II in her paternal grandmother; Lung disease in her sister. There is no history of Breast cancer.   Allergies Allergies  Allergen Reactions   Succinylcholine Other (See Comments)    Severe myalgias   Penicillins Rash    Tolerated cefazolin prior     Home Medications  Prior to Admission medications   Medication Sig Start Date End Date Taking? Authorizing Provider  cetirizine (ZYRTEC) 10 MG tablet Take 10 mg by mouth every morning.   Yes [provider]  Cholecalciferol (VITAMIN D) 50 MCG (2000 UT) tablet Take 2,000 Units by mouth every Monday, Wednesday, and Friday.   Yes [provider]  colestipol (COLESTID) 1 g tablet Take 2 g by mouth daily. 10/04/18 08/01/21 Yes [provider]  Fiber Diet TABS Take 2 tablets by mouth 2 (two) times daily.   Yes [provider]  gabapentin (NEURONTIN) 300 MG capsule Take 1 capsule (300 mg total) by mouth at bedtime. Patient taking differently: Take 300 mg by mouth 2 (two) times daily. 06/23/20  Yes Lorella Nimrod, MD  LANTUS SOLOSTAR 100 UNIT/ML Solostar Pen Inject 10 Units into the skin at bedtime. 07/12/21  Yes [provider]  multivitamin (RENA-VIT) TABS tablet Take 1 tablet by mouth every morning. 05/22/20  Yes [provider]  sevelamer carbonate (RENVELA) 800 MG tablet Take 1,600 mg by mouth 2 (two) times daily with a meal.   Yes [provider]  acetaminophen (TYLENOL) 325 MG tablet Take 650 mg by mouth every 4 (four) hours as needed for moderate pain.    [provider]  carvedilol (COREG) 12.5 MG tablet Take  12.5 mg by mouth 2 (two) times daily with a meal. Patient not taking: Reported on 07/01/2021    [provider]  glucose blood test strip 1 each by Finger Stick route 4 times daily (after meals and nightly). Use as directed 12/24/17   [provider]  HUMALOG 100 UNIT/ML injection Inject 4-12 Units into the skin 2 (two) times daily with a meal. 05/20/18   [provider]  hydrocortisone (ANUSOL-HC) 2.5 % rectal cream Apply 1 application. topically daily as needed. Patient not taking: Reported on 07/01/2021 06/03/21   [provider]  Hydrocortisone Acetate 2.5 % CREA Apply topically. 06/03/21   [provider]  INSULIN SYRINGE  1CC/29G 29G X 1/2" 1 ML MISC     [provider]  lidocaine (LIDODERM) 5 % Place 1 patch onto the skin daily as needed (pain).    [provider]  methocarbamol (ROBAXIN) 500 MG tablet Take 1 tablet (500 mg total) by mouth every 8 (eight) hours as needed for muscle spasms. Patient not taking: Reported on 07/01/2021 01/23/21   Loleta Dicker, PA  ondansetron (ZOFRAN) 4 MG tablet Take 1 tablet (4 MG) before prep starts. Take 3 tablets (9 MG) as needed 6 hours after for nausea. Patient not taking: Reported on 08/01/2021 07/01/21   [name], MD  pantoprazole (PROTONIX) 40 MG tablet Take 1 tablet (40 mg total) by mouth daily. 06/08/21 07/08/21  Nolberto Hanlon, MD  SURE COMFORT PEN NEEDLES 31G X 8 MM MISC  08/28/18   [provider]     Critical care time: 65 minutes       Venetia Night, AGACNP-BC Acute Care Nurse Practitioner [facility]   [phone] / [phone] Please see Amion for pager details.

## 2021-08-02 NOTE — Progress Notes (Signed)
PD exit site clean, dry and intact. New dressing done. PD initiated. Low drain volume alarm. Reposition patient side to side. Still Low drain volume alarm. Charge Nurse made aware. Called home choice operator and ordered to bypass the machine then the Cycler went to first fill successfully.

## 2021-08-02 NOTE — Progress Notes (Signed)
[facility]  ROUNDING NOTE   Subjective:   Misty [name] is a 81 year old Caucasian female with past medical history including GERD, hyperlipidemia, hypertension, anemia, and lower back pain, osteoarthritis, diabetes, TIA, and end-stage renal disease on peritoneal dialysis.  Patient presents to the emergency department with altered mental status and nausea and vomiting.  Patient has been admitted for Dehydration [E86.0] ESRD on peritoneal dialysis ([place]) [N18.6, Z99.2] Abdominal pain [R10.9] Altered mental status, unspecified altered mental status type [R41.82] Sepsis due to pneumonia ([place]) [J18.9, A41.9] Acute pancreatitis, unspecified complication status, unspecified pancreatitis type [K85.90] AMS (altered mental status) [R41.82]  Patient is known to our practice and peritoneal dialysis is monitored by Dr. Holley Raring in the [facility] clinic.  Patient currently seen resting quietly, responsive to painful stimuli. According to patient's husband and daughter, who are at bedside, they state patient has been feeling ill for 3 to 4 days.  Daughter states patient failed on Wednesday, unwitnessed.  They both states the nausea and vomiting began Wednesday afternoon into Thursday.  Along with altered mental status.  Daughter states when she saw her mother yesterday, her mother did not recognize her.  Husband has continued with nightly peritoneal dialysis treatments with negative UF.  Appetite has remained poor during that time.  Denies shortness of breath, cough, chest pain or discomfort.  Denies patient's discomfort with peritoneal dialysis treatments.  Labs on arrival significant for glucose 422, BUN 37, creatinine 4.22 with GFR 10, albumin 2.4, lactic acid 7.7.  Elevated white count 15.9. We have been evaluated to manage dialysis needs.  Objective:  Vital signs in last 24 hours:  Temp:  [97.7 F (36.5 C)-100 F (37.8 C)] 98.3 F (36.8 C) (05/26 1029) Pulse Rate:  [84-144] 87  (05/26 1300) Resp:  [12-31] 20 (05/26 1300) BP: (72-178)/(43-147) 111/45 (05/26 1300) SpO2:  [88 %-100 %] 99 % (05/26 1300) Weight:  [48.7 kg-51.5 kg] 48.7 kg (05/26 0715)  Weight change:  Filed Weights   08/01/21 1759 08/02/21 0404 08/02/21 0715  Weight: 49.9 kg 51.5 kg 48.7 kg    Intake/Output: I/O last 3 completed shifts: In: 2379.8 [I.V.:76.2; Other:200; IV Piggyback:2103.6] Out: 300 [Urine:100; Other:200]   Intake/Output this shift:  Total I/O In: 2000 [Other:2000] Out: 1950 [Other:1950]  Physical Exam: General: NAD  Head: Normocephalic, atraumatic. Moist oral mucosal membranes  Eyes: Anicteric  Lungs:  Clear to auscultation, normal effort  Heart: Regular rate and rhythm  Abdomen:  Soft, nontender, PDC  Extremities: No peripheral edema.  Neurologic: Nonfocal, moving all four extremities  Skin: No lesions  Access: PD catheter    Basic Metabolic Panel: Recent Labs  Lab 08/01/21 1804 08/02/21 0115 08/02/21 0511 08/02/21 1010 08/02/21 1206  NA 134* 136 145 147* 144  K 3.5 3.5 3.5 3.4* 3.5  CL 97* 102 105 107 108  CO2 18* 12* '24 26 26  '$ GLUCOSE 662* 677* 422* 315* 244*  BUN 33* 36* 37* 36* 36*  CREATININE 4.02* 4.39* 4.22* 4.14* 4.08*  CALCIUM 9.4 9.3 9.6 9.6 9.3  MG  --  2.3  --   --   --   PHOS  --  7.4*  --   --   --     Liver Function Tests: Recent Labs  Lab 08/01/21 1804 08/02/21 0115 08/02/21 0511  AST 38 70* 56*  ALT '15 16 17  '$ ALKPHOS 107 94 93  BILITOT 0.9 0.8 0.5  PROT 6.5 5.9* 6.1*  ALBUMIN 2.4* 2.2* 2.4*   Recent Labs  Lab  08/01/21 1804  LIPASE 361*   No results for input(s): AMMONIA in the last 168 hours.  CBC: Recent Labs  Lab 08/01/21 1804 08/02/21 0400  WBC 15.9* 17.6*  NEUTROABS 14.6*  --   HGB 12.6 10.2*  HCT 39.9 32.9*  MCV 95.5 96.2  PLT 422* 386    Cardiac Enzymes: No results for input(s): CKTOTAL, CKMB, CKMBINDEX, TROPONINI in the last 168 hours.  BNP: Invalid input(s): POCBNP  CBG: Recent Labs  Lab  08/02/21 0832 08/02/21 0934 08/02/21 1037 08/02/21 1152 08/02/21 1340  GLUCAP 173* 277* 305* 231* 151*    Microbiology: Results for orders placed or performed during the hospital encounter of 08/01/21  Culture, blood (Routine x 2)     Status: None (Preliminary result)   Collection Time: 08/01/21  6:04 PM   Specimen: BLOOD  Result Value Ref Range Status   Specimen Description BLOOD LEFT FOREARM  Final   Special Requests   Final    BOTTLES DRAWN AEROBIC AND ANAEROBIC Blood Culture results may not be optimal due to an excessive volume of blood received in culture bottles   Culture   Final    NO GROWTH < 12 HOURS Performed at [facility], [address], [place], [place] [address]    Report Status PENDING  Incomplete  Culture, blood (Routine x 2)     Status: None (Preliminary result)   Collection Time: 08/01/21  6:04 PM   Specimen: BLOOD  Result Value Ref Range Status   Specimen Description BLOOD LEFT ASSIST CONTROL  Final   Special Requests   Final    BOTTLES DRAWN AEROBIC AND ANAEROBIC Blood Culture results may not be optimal due to an excessive volume of blood received in culture bottles   Culture   Final    NO GROWTH < 12 HOURS Performed at [facility], [address] [place], [place] [address]    Report Status PENDING  Incomplete  Resp Panel by RT-PCR (Flu A&B, Covid) Anterior Nasal Swab     Status: None   Collection Time: 08/01/21  6:14 PM   Specimen: Anterior Nasal Swab  Result Value Ref Range Status   SARS Coronavirus 2 by RT PCR NEGATIVE NEGATIVE Final    Comment: (NOTE) SARS-CoV-2 target nucleic acids are NOT DETECTED.  The SARS-CoV-2 RNA is generally detectable in upper respiratory specimens during the acute phase of infection. The lowest concentration of SARS-CoV-2 viral copies this assay can detect is 138 copies/mL. A negative result does not preclude SARS-Cov-2 infection and should not be used as the sole basis for treatment or other  patient management decisions. A negative result may occur with  improper specimen collection/handling, submission of specimen other than nasopharyngeal swab, presence of viral mutation(s) within the areas targeted by this assay, and inadequate number of viral copies(<138 copies/mL). A negative result must be combined with clinical observations, patient history, and epidemiological information. The expected result is Negative.  Fact Sheet for Patients:  EntrepreneurPulse.com.au  Fact Sheet for Healthcare Providers:  IncredibleEmployment.be  This test is no t yet approved or cleared by the [place] FDA and  has been authorized for detection and/or diagnosis of SARS-CoV-2 by FDA under an Emergency Use Authorization (EUA). This EUA will remain  in effect (meaning this test can be used) for the duration of the COVID-19 declaration under Section 564(b)(1) of the Act, 21 U.S.C.section 360bbb-3(b)(1), unless the authorization is terminated  or revoked sooner.       Influenza A by PCR  NEGATIVE NEGATIVE Final   Influenza B by PCR NEGATIVE NEGATIVE Final    Comment: (NOTE) The Xpert Xpress SARS-CoV-2/FLU/RSV plus assay is intended as an aid in the diagnosis of influenza from Nasopharyngeal swab specimens and should not be used as a sole basis for treatment. Nasal washings and aspirates are unacceptable for Xpert Xpress SARS-CoV-2/FLU/RSV testing.  Fact Sheet for Patients: EntrepreneurPulse.com.au  Fact Sheet for Healthcare Providers: IncredibleEmployment.be  This test is not yet approved or cleared by the [place] FDA and has been authorized for detection and/or diagnosis of SARS-CoV-2 by FDA under an Emergency Use Authorization (EUA). This EUA will remain in effect (meaning this test can be used) for the duration of the COVID-19 declaration under Section 564(b)(1) of the Act, 21 U.S.C. section  360bbb-3(b)(1), unless the authorization is terminated or revoked.  Performed at [facility], [address] [place], [place] [address]   Body fluid culture w Gram Stain     Status: None (Preliminary result)   Collection Time: 08/01/21  8:21 PM   Specimen: Peritoneal Dialysate; Body Fluid  Result Value Ref Range Status   Specimen Description   Final    PERITONEAL DIALYSATE Performed at [facility], [address] [place], [place] [address]    Special Requests   Final    NONE Performed at [facility], [address] [place], [place] [address]    Gram Stain   Final    CYTOSPIN SMEAR WBC PRESENT, PREDOMINANTLY MONONUCLEAR NO ORGANISMS SEEN    Culture   Final    NO GROWTH < 12 HOURS Performed at [facility] [address] [place], [place] [address]    Report Status PENDING  Incomplete  MRSA Next Gen by PCR, Nasal     Status: None   Collection Time: 08/01/21 11:49 PM   Specimen: Nasal Mucosa; Nasal Swab  Result Value Ref Range Status   MRSA by PCR Next Gen NOT DETECTED NOT DETECTED Final    Comment: (NOTE) The GeneXpert MRSA Assay (FDA approved for NASAL specimens only), is one component of a comprehensive MRSA colonization surveillance program. It is not intended to diagnose MRSA infection nor to guide or monitor treatment for MRSA infections. Test performance is not FDA approved in patients less than 78 years old. Performed at [facility], [address] [place], [place] [address]     Coagulation Studies: Recent Labs    08/01/21 1804  LABPROT 13.8  INR 1.1    Urinalysis: Recent Labs    08/01/21 1802  COLORURINE YELLOW*  LABSPEC 1.023  PHURINE 6.0  GLUCOSEU >=500*  HGBUR NEGATIVE  BILIRUBINUR NEGATIVE  KETONESUR 5*  PROTEINUR >=300*  NITRITE NEGATIVE  LEUKOCYTESUR NEGATIVE      Imaging: CT ABDOMEN PELVIS WO CONTRAST  Result Date: 08/01/2021 CLINICAL DATA:  Nausea and vomiting EXAM: CT ABDOMEN  AND PELVIS WITHOUT CONTRAST TECHNIQUE: Multidetector CT imaging of the abdomen and pelvis was performed following the standard protocol without IV contrast. RADIATION DOSE REDUCTION: This exam was performed according to the departmental dose-optimization program which includes automated exposure control, adjustment of the mA and/or kV according to patient size and/or use of iterative reconstruction technique. COMPARISON:  CTA abdomen and pelvis 06/05/2021 FINDINGS: Lower chest: Small right and trace left pleural effusions. Hazy and irregular densities in the lower lungs likely represent subsegmental atelectasis and possibly infiltrates, greater on the right. Hepatobiliary: Liver is normal in size with no focal mass identified. Gallbladder is significantly distended and  contains a 2.6 cm calculus. No significant gallbladder wall thickening or surrounding inflammatory changes. No biliary ductal dilatation appreciated. Pancreas: Mild peripancreatic fat stranding at the tail the pancreas. No pancreatic ductal dilatation. Spleen: Normal size with several tiny calcified granulomas. Adrenals/Urinary Tract: Adrenal glands appear normal. Kidneys are atrophic with scattered likely vascular calcifications. No definite nephrolithiasis or hydronephrosis identified bilaterally. Urinary bladder appears normal. Stomach/Bowel: Tiny hiatal hernia. No bowel obstruction, free air or pneumatosis. No bowel wall edema identified. No evidence of acute appendicitis. Vascular/Lymphatic: Severe atherosclerotic disease including large nearly occlusive appearing calcified plaque in the mid abdominal aorta. No bulky lymphadenopathy identified. Reproductive: Small uterus with calcifications. Other: Small volume ascites mostly in the upper abdomen. Stable loculated fluid collection in the right pelvis which appears chronic and likely postsurgical in nature. Stable chronic area of fat necrosis in the mid abdomen near the level of the umbilicus. A  percutaneous catheter is again seen with the tip coiled in the pelvis. Musculoskeletal: Advanced degenerative changes of the spine. No suspicious bony lesions visualized. IMPRESSION: 1. Mild peripancreatic edema which could be secondary to mild acute pancreatitis or related to the small volume ascites. Correlate clinically. 2. Cholelithiasis and markedly distended gallbladder. No gallbladder wall thickening or surrounding inflammatory changes visualized. Correlate clinically and consider follow-up ultrasound if indicated. 3. Small right pleural effusion and trace left pleural effusion. Associated subsegmental atelectatic changes and possibly infiltrates in the lower lungs. 4. Severe atherosclerotic disease. 5. Other chronic findings as described. Electronically Signed   By: Ofilia Neas M.D.   On: 08/01/2021 19:57   CT Head Wo Contrast  Result Date: 08/01/2021 CLINICAL DATA:  Mental status change of unknown cause beginning 2 days ago. Vomiting. EXAM: CT HEAD WITHOUT CONTRAST TECHNIQUE: Contiguous axial images were obtained from the base of the skull through the vertex without intravenous contrast. RADIATION DOSE REDUCTION: This exam was performed according to the departmental dose-optimization program which includes automated exposure control, adjustment of the mA and/or kV according to patient size and/or use of iterative reconstruction technique. COMPARISON:  MRI 07/26/2021.  CT 06/04/2021 FINDINGS: Brain: No acute finding. Age related atrophy. Chronic small-vessel ischemic changes of the white matter. 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 No sign of acute infarction, mass lesion, hemorrhage, hydrocephalus or extra-axial collection. Vascular: There is atherosclerotic calcification of the major vessels at the base of the brain. Skull: Negative Sinuses/Orbits: Clear/normal Other: None IMPRESSION: No acute finding. Atrophy and chronic small-vessel ischemic changes as above, similar  to previous exams. Electronically Signed   By: Nelson Chimes M.D.   On: 08/01/2021 18:50   IR Perc Cholecystostomy  Result Date: 08/02/2021 INDICATION: Acute calculus cholecystitis with sepsis EXAM: Placement of percutaneous cholecystostomy tube using ultrasound and fluoroscopic guidance MEDICATIONS: Per EMR ANESTHESIA/SEDATION: Local analgesia FLUOROSCOPY: Radiation Exposure Index (as provided by the fluoroscopic device): 1.3 minutes (18 mGy) COMPLICATIONS: None immediate. PROCEDURE: Informed written consent was obtained from the patient after a thorough discussion of the procedural risks, benefits and alternatives. All questions were addressed. Maximal Sterile Barrier Technique was utilized including caps, mask, sterile gowns, sterile gloves, sterile drape, hand hygiene and skin antiseptic. A timeout was performed prior to the initiation of the procedure. The patient was placed supine on the exam table. The right upper quadrant was prepped and draped in the standard sterile fashion. Ultrasound of the right upper quadrant was performed for planning purposes. This again demonstrated distended gallbladder with large gallstone in a dependent position. An intercostal transhepatic approach  was planned. Skin entry site was marked, and local analgesia was obtained with 1% lidocaine. Under ultrasound guidance, percutaneous access was obtained into the gallbladder via an intercostal transhepatic approach using a 21 gauge Chiba needle. Access was confirmed with visualization of needle tip within the gallbladder lumen, and free return of bile. An 018 Nitrex wire was then advanced through the access needle and coiled within the gallbladder lumen. A transition dilator was advanced over this wire, through which an antegrade cholecystogram was performed. Antegrade cholecystogram demonstrated appropriate location in the gallbladder lumen and gallbladder sludge with a large gallstone. Over an Amplatz wire, the percutaneous tract  was serially dilated followed by placement of a 10 French locking multipurpose drainage catheter into the gallbladder lumen. Locking loop was formed. Additional biliary sludge was drained. Gentle hand injection of contrast material confirmed location of the gallbladder lumen. The drainage catheter was secured to the skin using silk suture and a dressing. It was placed to bag drainage. The patient tolerated the procedure well without immediate complication. IMPRESSION: Successful placement of a 10 French percutaneous cholecystostomy drainage catheter via an intercostal transhepatic approach. Drain placed to bag drainage. Further follow-up recommendations per surgery. If drain remains after 6 weeks, patient to return to Interventional Radiology for routine catheter care. Electronically Signed   By: Albin Felling M.D.   On: 08/02/2021 11:28   DG Chest Portable 1 View  Result Date: 08/01/2021 CLINICAL DATA:  Altered mental status sepsis EXAM: PORTABLE CHEST 1 VIEW COMPARISON:  06/21/2020 FINDINGS: Borderline cardiac size. Vascular congestion. Heterogeneous asymmetric right greater than left interstitial and ground-glass opacity. No pleural effusion or pneumothorax. Aortic atherosclerosis. IMPRESSION: Asymmetric heterogeneous right greater than left interstitial and hazy pulmonary density which may be due to asymmetric edema or diffuse infection to include atypical process Electronically Signed   By: Donavan Foil M.D.   On: 08/01/2021 19:11   ECHOCARDIOGRAM COMPLETE  Result Date: 08/02/2021    ECHOCARDIOGRAM REPORT   Patient Name:   Misty [name] Date of Exam: 08/02/2021 Medical Rec #:  [id number]         Height:       63.0 in Accession #:    [id number]        Weight:       107.4 lb Date of Birth:  11-Apr-1940         BSA:          1.484 m Patient Age:    10 years          BP:           131/56 mmHg Patient Gender: F                 HR:           96 bpm. Exam Location:  [facility] Procedure: 2D Echo, Color Doppler  and Cardiac Doppler Indications:     I50.31 congestive heart failure-Acute Diastolic  History:         Patient has prior history of Echocardiogram examinations. ESRD                  and TIA; Risk Factors:Hypertension, Diabetes and Dyslipidemia.  Sonographer:     Charmayne Sheer Referring Phys:  [id number] BRITTON L RUST-CHESTER Diagnosing Phys: Donnelly Angelica  Sonographer Comments: Suboptimal subcostal window. IMPRESSIONS  1. Left ventricular ejection fraction, by estimation, is 35 to 40%. The left ventricle has moderately decreased function. The left ventricle demonstrates regional wall motion abnormalities (see scoring diagram/findings for description).  Left ventricular  diastolic parameters are consistent with Grade I diastolic dysfunction (impaired relaxation). There is akinesis of the left ventricular, entire apical segment.  2. Right ventricular systolic function is normal. The right ventricular size is normal.  3. The mitral valve is normal in structure. Mild mitral valve regurgitation. No evidence of mitral stenosis.  4. The aortic valve is calcified. Aortic valve regurgitation is moderate. Mild aortic valve stenosis. Aortic regurgitation PHT measures 242 msec.  5. Aortic dilatation noted. There is mild dilatation of the ascending aorta, measuring 38 mm. Comparison(s): Significant apical WMA in multiple vascular territories (? stress cardiomyopathy). AI appears worse. FINDINGS  Left Ventricle: Left ventricular ejection fraction, by estimation, is 35 to 40%. The left ventricle has moderately decreased function. The left ventricle demonstrates regional wall motion abnormalities. The left ventricular internal cavity size was normal in size. There is no left ventricular hypertrophy. Left ventricular diastolic parameters are consistent with Grade I diastolic dysfunction (impaired relaxation). Right Ventricle: The right ventricular size is normal. Right vetricular wall thickness was not well visualized. Right ventricular  systolic function is normal. Left Atrium: Left atrial size was normal in size. Right Atrium: Right atrial size was normal in size. Pericardium: There is no evidence of pericardial effusion. Mitral Valve: The mitral valve is normal in structure. Mild mitral valve regurgitation. No evidence of mitral valve stenosis. MV peak gradient, 3.7 mmHg. The mean mitral valve gradient is 2.0 mmHg. Tricuspid Valve: The tricuspid valve is normal in structure. Tricuspid valve regurgitation is mild. Aortic Valve: The aortic valve is calcified. Aortic valve regurgitation is moderate. Aortic regurgitation PHT measures 242 msec. Mild aortic stenosis is present. Aortic valve mean gradient measures 10.0 mmHg. Aortic valve peak gradient measures 18.3 mmHg. Aortic valve area, by VTI measures 1.43 cm. Pulmonic Valve: The pulmonic valve was normal in structure. Aorta: Aortic dilatation noted. There is mild dilatation of the ascending aorta, measuring 38 mm. Venous: The inferior vena cava was not well visualized.  LEFT VENTRICLE PLAX 2D LVIDd:         4.03 cm      Diastology LVIDs:         3.10 cm      LV e' medial:    4.13 cm/s LV PW:         1.09 cm      LV E/e' medial:  11.9 LV IVS:        1.03 cm      LV e' lateral:   5.44 cm/s LVOT diam:     2.00 cm      LV E/e' lateral: 9.0 LV SV:         47 LV SV Index:   32 LVOT Area:     3.14 cm  LV Volumes (MOD) LV vol d, MOD A2C: 101.0 ml LV vol d, MOD A4C: 95.2 ml LV vol s, MOD A2C: 77.5 ml LV vol s, MOD A4C: 47.8 ml LV SV MOD A2C:     23.5 ml LV SV MOD A4C:     95.2 ml LV SV MOD BP:      41.1 ml RIGHT VENTRICLE RV Basal diam:  2.03 cm LEFT ATRIUM           Index        RIGHT ATRIUM          Index LA diam:      3.50 cm 2.36 cm/m   RA Area:     8.73 cm LA Vol (A2C): 28.4  ml 19.13 ml/m  RA Volume:   16.20 ml 10.91 ml/m LA Vol (A4C): 33.8 ml 22.77 ml/m  AORTIC VALVE                     PULMONIC VALVE AV Area (Vmax):    1.37 cm      PV Vmax:       0.73 m/s AV Area (Vmean):   1.27 cm      PV  Vmean:      46.300 cm/s AV Area (VTI):     1.43 cm      PV VTI:        0.100 m AV Vmax:           214.00 cm/s   PV Peak grad:  2.1 mmHg AV Vmean:          147.000 cm/s  PV Mean grad:  1.0 mmHg AV VTI:            0.332 m AV Peak Grad:      18.3 mmHg AV Mean Grad:      10.0 mmHg LVOT Vmax:         93.30 cm/s LVOT Vmean:        59.500 cm/s LVOT VTI:          0.151 m LVOT/AV VTI ratio: 0.45 AI PHT:            242 msec  AORTA Ao Root diam: 3.00 cm MITRAL VALVE               TRICUSPID VALVE MV Area (PHT): 7.66 cm    TR Peak grad:   27.5 mmHg MV Area VTI:   3.70 cm    TR Vmax:        262.00 cm/s MV Peak grad:  3.7 mmHg MV Mean grad:  2.0 mmHg    SHUNTS MV Vmax:       0.96 m/s    Systemic VTI:  0.15 m MV Vmean:      58.5 cm/s   Systemic Diam: 2.00 cm MV Decel Time: 99 msec MV E velocity: 49.20 cm/s MV A velocity: 85.90 cm/s MV E/A ratio:  0.57 Donnelly Angelica Electronically signed by Donnelly Angelica Signature Date/Time: 08/02/2021/1:47:52 PM    Final    US ABDOMEN LIMITED RUQ (LIVER/GB)  Result Date: 08/01/2021 CLINICAL DATA:  epigastric pain, sepsis EXAM: ULTRASOUND ABDOMEN LIMITED RIGHT UPPER QUADRANT COMPARISON:  CT 08/01/2021 FINDINGS: Gallbladder: Dilated gallbladder with large intraluminal stone which measures up to 2.6 cm. No wall thickening or pericholecystic fluid. Reportedly positive sonographic Murphy sign. Common bile duct: Diameter: 4.6 mm, normal.  No intrahepatic biliary ductal dilation. Liver: Diffusely increased liver echogenicity. Portal vein is patent on color Doppler imaging with normal direction of blood flow towards the liver. Other: Right pleural effusion. IMPRESSION: Dilated gallbladder with large intraluminal stone measuring up to 2.6 cm as seen on recent CT. No wall thickening or pericholecystic fluid. Reportedly positive sonographic Murphy sign, which is nonspecific in the setting of suspected pancreatitis. Correlate with exam and laboratory findings. Diffuse hepatic steatosis. Electronically Signed    By: Maurine Simmering M.D.   On: 08/01/2021 21:37     Medications:    sodium chloride 250 mL (08/02/21 0428)   ceFEPime (MAXIPIME) IV     dexmedetomidine (PRECEDEX) IV infusion 0.4 mcg/kg/hr (08/02/21 0615)   dextrose 5% lactated ringers Stopped (08/02/21 0930)   dialysis solution 2.5% low-MG/low-CA     insulin 3.2 Units/hr (08/02/21 1220)  metronidazole 500 mg (08/02/21 4098)   norepinephrine (LEVOPHED) Adult infusion 2 mcg/min (08/02/21 1001)    Chlorhexidine Gluconate Cloth  6 each Topical Daily   cholecalciferol  2,000 Units Oral Q M,W,F   fentaNYL       gentamicin cream  1 application. Topical Daily   mouth rinse  15 mL Mouth Rinse BID   pantoprazole (PROTONIX) IV  40 mg Intravenous Daily   sevelamer carbonate  1,600 mg Oral BID WC   acetaminophen **OR** acetaminophen, albuterol, dextrose, fentaNYL, hydrALAZINE, morphine injection, ondansetron **OR** ondansetron (ZOFRAN) IV  Assessment/ Plan:  Misty [name] is a 81 y.o.  female past medical history including GERD, hyperlipidemia, hypertension, anemia, and lower back pain, osteoarthritis, diabetes, TIA, and end-stage renal disease on peritoneal dialysis.  Patient presents to the emergency department with altered mental status and nausea and vomiting.  Patient has been admitted for Dehydration [E86.0] ESRD on peritoneal dialysis (Misty [name]) [N18.6, Z99.2] Abdominal pain [R10.9] Altered mental status, unspecified altered mental status type [R41.82] Sepsis due to pneumonia ([place]) [J18.9, A41.9] Acute pancreatitis, unspecified complication status, unspecified pancreatitis type [K85.90] AMS (altered mental status) [R41.82]  [facility]  End-stage renal disease requiring peritoneal dialysis.  Cell count and culture sample retrieved yesterday at ED physician request.  Attempted to provide PD treatment during daytime however emergent procedure required, treatment aborted.  We will proceed with nightly scheduled treatments.  Dialysate 1.5% due  to hypovolemic state.  2. Anemia of chronic kidney disease Lab Results  Component Value Date   HGB 10.2 (L) 08/02/2021  Hemoglobin remains within desired target.  We will continue to monitor  3. Secondary Hyperparathyroidism:  Lab Results  Component Value Date   CALCIUM 9.3 08/02/2021   CAION 1.22 01/23/2021   PHOS 7.4 (H) 08/02/2021  Calcium remains within acceptable range however phosphorus elevated.  Sevelamer ordered with meals.  4. Diabetes mellitus type II with chronic kidney disease insulin dependent. Home regimen includes Lantus and Humalog. Most recent hemoglobin A1c is 6.6 on 06/04/21.   5.  Acute cholecystitis.  IR consulted and placed percutaneous cholecystectomy tube   LOS: 1 Ranulfo Kall 5/26/20232:24 PM

## 2021-08-02 NOTE — Progress Notes (Signed)
[place] for heparin initiation and monitoring Indication: chest pain/ACS  Allergies  Allergen Reactions   Succinylcholine Other (See Comments)    Severe myalgias   Penicillins Rash    Tolerated cefazolin prior    Patient Measurements: Height:  (unable to get weight. bed scale is error) Weight: 48.7 kg (107 lb 5.8 oz) Heparin Dosing Weight: 48.7 kg  Vital Signs: Temp: 98.3 F (36.8 C) (05/26 1029) Temp Source: Axillary (05/26 1029) BP: 88/70 (05/26 1415) Pulse Rate: 87 (05/26 1300)  Labs: Recent Labs    08/01/21 1804 08/02/21 0115 08/02/21 0400 08/02/21 0511 08/02/21 1010 08/02/21 1206 08/02/21 1446  HGB 12.6  --  10.2*  --   --   --   --   HCT 39.9  --  32.9*  --   --   --   --   PLT 422*  --  386  --   --   --   --   LABPROT 13.8  --   --   --   --   --   --   INR 1.1  --   --   --   --   --   --   CREATININE 4.02*   < >  --  4.22* 4.14* 4.08*  --   TROPONINIHS  --   --   --   --   --   --  8,290*   < > = values in this interval not displayed.    Estimated Creatinine Clearance: 8.7 mL/min (A) (by C-G formula based on SCr of 4.08 mg/dL (H)).   Medical History: Past Medical History:  Diagnosis Date   Anemia of chronic renal failure    Aortic valve stenosis 07/02/2017   a.) TTE 07/02/2017: 55-60%; mild AS with MPG 13.0 mmHg. b.) TTE 10/14/2019: EF >55%; MPG 23.0 mmHg. c.) TTE 06/23/2020: EF 50-55%; MPG 19.7 mmHg. d.) TTE 11/27/2020: EF >55%; MPG 18.0 mmHg.   Chronic lower back pain    Complication of anesthesia    a.) severe myalgias with succinylcholine use. b.) (+) difficult airway.   Diastolic dysfunction 52/77/8242   a.) TTE 07/02/2017; EF 55-60%; mild AR; G1DD. b.) TTE 06/23/2020: EF 50-55% with mild LVH; mild to mod LA enlargement; mild AR, mild to mod MR; G2DD.   Difficult airway for intubation    a.) 08/08/2020 at [facility] --> Initial attempt with Sabra Heck 3, larynx was "rigid/fixed"; unable to visualize anything but  epiglottis. Glidescope utilized to obtain a great view with VL. First attempt with Glidescope unsuccessful d/t anterior orientation. ETT withdrawn and a greater bend was placed. Second attempt was successful with effort. Patient sustained lip laceration during intubation attempts.   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   a.) TTE 11/03/2019: measured 3.7 cm   ESRD on peritoneal dialysis ([facility])    GERD (gastroesophageal reflux disease)    Glaucoma    Heart murmur    HLD (hyperlipidemia)    Hyperparathyroidism ([place])    a.) s/p total parathyroidectomy   Hypertension    Hypertensive retinopathy    Lumbar radiculopathy    MGUS (monoclonal gammopathy of unknown significance)    Motion sickness    Nonproliferative diabetic retinopathy ([place])    Osteoarthritis    Peritoneal dialysis catheter in place [facility])    Personal history of chemotherapy    Personal history of radiation therapy    Rectal cancer ([place]) 2011   a.) Clinical stage T3N0. b.) s/p 3 cycles  of neoadjuvant CI 5FU + XRT c.) s/p resection on 08/08/2009.   Spinal stenosis of lumbar region with neurogenic claudication    T2DM (type 2 diabetes mellitus) ([place])    TIA (transient ischemic attack)     Medications:  Scheduled:   Chlorhexidine Gluconate Cloth  6 each Topical Daily   cholecalciferol  2,000 Units Oral Q M,W,F   fentaNYL       gentamicin cream  1 application. Topical Daily   insulin aspart  0-6 Units Subcutaneous Q4H   insulin detemir  5 Units Subcutaneous BID   mouth rinse  15 mL Mouth Rinse BID   pantoprazole (PROTONIX) IV  40 mg Intravenous Daily   sevelamer carbonate  1,600 mg Oral BID WC    Assessment: 81 y.o. female w/ PMH of ESRD on peritoneal dialysis, diastolic heart failure, type 2 diabetes, multiple myeloma, hypertension, rectal cancer who is brought to the ED due to altered mental status for the last 2 days associate with nausea and vomiting. Cardiology was consulted for abnormal echo, elevated  troponins. Heparin is being initiated. She is on no chronic anticoagulation  Goal of Therapy:  Heparin level 0.3-0.7 units/ml Monitor platelets by anticoagulation protocol: Yes   Plan:  Give 3000 units bolus x 1 Start heparin infusion at 600 units/hr Check anti-Xa level in 8 hours and daily while on heparin Continue to monitor H&H and platelets  Misty [name] 08/02/2021,4:26 PM

## 2021-08-02 NOTE — Progress Notes (Signed)
ICU nurse informed me that PD treatment needs to be stopped because patient needs an emergency procedure. NP Shantelle made aware and is okay to terminate treatment.  Patient already on first dwell. NP Shantelle ordered manual drain. PD stopped. Manual drain done. Total drain 1950 ml. No UF removed.  Effluent appearance is clear and yellow, No fibrin. PD exit site clean, dry and intact, PD catheter capped and secured.

## 2021-08-02 NOTE — Progress Notes (Signed)
   BRIEF PCCM NOTE  Pt was seen earlier by [facility] provider Domingo Pulse Rust-Chester, APP.  Please see her H&P with attestation by Dr. Merrilee Jansky for full assessment & plan.  BRIEF PT DESCRIPTION / SYNOPSIS :  81 y.o. Female admitted Severe Sepsis in the setting of suspected acute cholecystitis & pancreatitis, Acute Hypoxic Respiratory Failure due to Acute Decompensated HFpEF, severe Hyperglycemia (DKA vs. HHS), and Acute Metabolic Encephalopathy.  General Surgery is following.  SUBJECTIVE / INTERVAL HISTORY :  -Requiring Precedex for AMS -Low dose Levophed (2 mcg) -Plan for percutaneous drain by IR this morning -Lactic acid is trending down to 4.7 (7.7)  OBJECTIVE :   Today's Vitals   08/02/21 0645 08/02/21 0700 08/02/21 0715 08/02/21 0800  BP: (!) 111/57 110/62 (!) 105/49 (!) 118/54  Pulse: 90 90 87 97  Resp: 16 (!) 29 (!) 24 (!) 25  Temp:   98 F (36.7 C)   TempSrc:   Axillary   SpO2: 100% 100% 100% 100%  Weight:   48.7 kg    Body mass index is 19.02 kg/m.   ASSESSMENT / PLAN :   Acute Hypoxic Respiratory Failure secondary to acute pulmonary edema & multiple metabolic derangements -Supplemental O2 as needed to maintain O2 sats >90% -BiPAP, wean as tolerated -Follow intermittent Chest X-ray & ABG as needed -Bronchodilators as needed -Diuresis as BP and renal function permits ~ received 80 mg IV Lasix earlier this morning -Pulmonary toilet as able  Septic Shock Acute Decompensated HFpEF STAT BNP: >4,500 -Continuous cardiac monitoring -Maintain MAP >65 -Vasopressors as needed to maintain MAP goal -Trend lactic acid until normalized -Trend HS Troponin until peaked -Echocardiogram pending  Severe Sepsis due to suspected acute cholecystitis & pancreatitis Lactic: 4.8 - 5.1 - >9.0 Initial interventions/workup included: 1.3 L of NS/LR & Cefepime/ Metronidazole -Monitor fever curve -Trend WBC's & Procalcitonin -Follow cultures as above -Continue empiric Cefepime & Flagyl  pending cultures & sensitivities -General surgery following, appreciate input ~ plan for IR placement of percutaneous drain 5/26 - monitor hepatic fxn, lipase & amylase   Hyperglycemia secondary to mild Diabetic ketoacidosis vs HHS Worsening AGMA PMHx: T2DM - Hyperglycemia protocol initiated > will hold off on continuous IVF due to HFpEF exacerbation and tenuous respiratory status, reassess when CBG drops < 250 -Follow DKA/HHS protocol -Insulin drip -Follow BMP every 4 hours -Once anion gap closed and serum bicarb greater than 20, can convert to long-acting insulin and sliding scale insulin (diabetes coronary recommends 5 units Levemir twice daily and sensitive sliding scale) - Diabetes coordinator consult   ESRD on PD -Monitor I&O's / urinary output -Follow BMP -Ensure adequate renal perfusion -Avoid nephrotoxic agents as able -Replace electrolytes as indicated -Nephrology following, appreciate input ~ PD as per Nephrology   Acute Encephalopathy suspect acute delirium multifocal in the setting of severe sepsis, acidosis and severe hyperglycemia -supportive care - precedex drip as needed, wean as tolerated  - frequent neuro checks  - consider MRI if confusion does not improve    Patient is critically ill, high risk for deterioration, need for mechanical ventilation, cardiac arrest and death.  Recommend DNR/DNI status.  Palliative care consulted to assist with goals of care.  Updated patient's husband at bedside 5/26..   Additional Critical Care Time: 30 minutes  Darel Hong, AGACNP-BC [facility] Prefer epic messenger for cross cover needs If after hours, please call E-link

## 2021-08-02 NOTE — Assessment & Plan Note (Addendum)
Received message about pt HR being high and pt being restless and requested ICU provider to get involved for pt I suspect respiratory distress 2nd to volume overload.  D/W Domingo Pulse about pt and they started pt on Precedex.  ABG pending. Lasix 80 mg as pt still makes urine. Consulted nephrology for PD. Bipap as deemed appropriate.

## 2021-08-02 NOTE — Assessment & Plan Note (Signed)
Pt has elevated AG metabolic acidosis and has combination of mild DKA, severe sepsis, lactic acidosis. We will start drip as her repeat sugar is still over 400.  Limit IVF bolus and use small bolus as needed.  Sodium bicarb if pH is less than 7.2.

## 2021-08-02 NOTE — Progress Notes (Addendum)
Updated husband and daughter bedside regarding recent lab results and plan of care. Discussed at length her current status and the multiple active problems we are treating.  Husband understands that her condition is serious and has made it clear he wants all interventions. All questions and concerns answered at this time.  Time bedside: 20 minutes  Venetia Night, AGACNP-BC Acute Care Nurse Practitioner [place]   [phone] / [phone] Please see Amion for pager details.

## 2021-08-02 NOTE — Procedures (Signed)
Interventional Radiology Procedure Note  Date of Procedure: 08/02/2021  Procedure: Cholecystostomy tube placement   Findings:  1. Placement of 10 Fr percutaneous cholecystostomy tube via intercostal transhepatic approach    Complications: No immediate complications noted.   Estimated Blood Loss: minimal  Follow-up and Recommendations: 1. Keep to bag drain  2. Follow up surgical recs  3. Follow up with IR in 6 weeks if drain remains in place    Albin Felling, MD  Vascular & Interventional Radiology  08/02/2021 11:19 AM

## 2021-08-02 NOTE — Progress Notes (Signed)
*  PRELIMINARY RESULTS* Echocardiogram 2D Echocardiogram has been performed.  Misty [name] 08/02/2021, 10:37 AM

## 2021-08-02 NOTE — Progress Notes (Signed)
Worsening Acute Toxic Encephalopathy multifocal in the setting of missed PD sessions, sepsis, possible delirium & possible build up of previous sedatives Called bedside by nursing due to worsened encephalopathy, hypertension and tachycardia. Precedex has been off since 17:00 Patient having generalized tremors, that do not appear rhythmic. PERRLA but with downward Left deviation. Patient withdraws to pain with persistent stimulation. RASS: - 4 & GCS: 6.  Patient still protecting airway on 2 L [place] with SpO2 100%. Differentials include: toxic encephalopathy due to lack of PD, CVA, hyperammoniemia, hypercarbia, seizures. Reassured that patient is responsive to pain and is currently protecting airway.  - HR & BP improved with 2.5 mg of lopressor IVP. RN had already given 5 mg of hydralazine. - f/u STAT ammonia, VBG, Mg - STAT CTH  - EKG ordered: I, Cheryll Cockayne Rust-Chester, AGACNP-BC, personally viewed and interpreted this ECG. EKG Interpretation: Date: 08/02/21, EKG Time: 22:19, Rate: 130, Rhythm: ST, QRS Axis: LAD, Intervals: LAFB, ST/T Wave abnormalities: none, Narrative Interpretation: ST with LAFB and LAD appears unchanged from previous- Qtc has improved however from previous Plan discussed with bedside nursing.   Cheryll Cockayne Rust-Chester, AGACNP-BC Acute Care Nurse Practitioner [place] [facility]   [phone] / [phone] Please see Amion for pager details.

## 2021-08-02 NOTE — H&P (Signed)
Interventional Radiology - Inpatient Consult H&P    Referring Provider: Dr. Caroleen Hamman   Reason for Consult: Acute cholecystitis     History of Present Illness  Misty [name] is a 81 y.o. female who is currently admitted to the ICU in sepsis with CT and US findings suggestive of acute calculous cholecystitis.     Additional Past Medical History Past Medical History:  Diagnosis Date   Anemia of chronic renal failure    Aortic valve stenosis 07/02/2017   a.) TTE 07/02/2017: 55-60%; mild AS with MPG 13.0 mmHg. b.) TTE 10/14/2019: EF >55%; MPG 23.0 mmHg. c.) TTE 06/23/2020: EF 50-55%; MPG 19.7 mmHg. d.) TTE 11/27/2020: EF >55%; MPG 18.0 mmHg.   Chronic lower back pain    Complication of anesthesia    a.) severe myalgias with succinylcholine use. b.) (+) difficult airway.   Diastolic dysfunction 11/91/4782   a.) TTE 07/02/2017; EF 55-60%; mild AR; G1DD. b.) TTE 06/23/2020: EF 50-55% with mild LVH; mild to mod LA enlargement; mild AR, mild to mod MR; G2DD.   Difficult airway for intubation    a.) 08/08/2020 at [facility] --> Initial attempt with Sabra Heck 3, larynx was "rigid/fixed"; unable to visualize anything but epiglottis. Glidescope utilized to obtain a great view with VL. First attempt with Glidescope unsuccessful d/t anterior orientation. ETT withdrawn and a greater bend was placed. Second attempt was successful with effort. Patient sustained lip laceration during intubation attempts.   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   a.) TTE 11/03/2019: measured 3.7 cm   ESRD on peritoneal dialysis ([place])    GERD (gastroesophageal reflux disease)    Glaucoma    Heart murmur    HLD (hyperlipidemia)    Hyperparathyroidism ([place])    a.) s/p total parathyroidectomy   Hypertension    Hypertensive retinopathy    Lumbar radiculopathy    MGUS (monoclonal gammopathy of unknown significance)    Motion sickness    Nonproliferative diabetic retinopathy ([place])    Osteoarthritis    Peritoneal  dialysis catheter in place [facility])    Personal history of chemotherapy    Personal history of radiation therapy    Rectal cancer ([place]) 2011   a.) Clinical stage T3N0. b.) s/p 3 cycles of neoadjuvant CI 5FU + XRT c.) s/p resection on 08/08/2009.   Spinal stenosis of lumbar region with neurogenic claudication    T2DM (type 2 diabetes mellitus) ([place])    TIA (transient ischemic attack)     Surgical History  Past Surgical History:  Procedure Laterality Date   BACK SURGERY     BREAST BIOPSY Left    bx/ clip-neg   COLON SURGERY     COLONOSCOPY N/A 05/03/2020   Procedure: COLONOSCOPY;  Surgeon: Lesly Rubenstein, MD;  Location: [facility] ENDOSCOPY;  Service: Endoscopy;  Laterality: N/A;   COLONOSCOPY WITH PROPOFOL N/A 07/02/2021   Procedure: COLONOSCOPY WITH PROPOFOL;  Surgeon: [name], MD;  Location: [facility] ENDOSCOPY;  Service: Gastroenterology;  Laterality: N/A;   coloretcal cancer     DIALYSIS/PERMA CATHETER REMOVAL N/A 10/01/2020   Procedure: DIALYSIS/PERMA CATHETER REMOVAL;  Surgeon: Algernon Huxley, MD;  Location: [place] CV LAB;  Service: Cardiovascular;  Laterality: N/A;   EMBOLIZATION N/A 06/06/2021   Procedure: EMBOLIZATION;  Surgeon: Algernon Huxley, MD;  Location: [place] CV LAB;  Service: Cardiovascular;  Laterality: N/A;   LUMBAR LAMINECTOMY/DECOMPRESSION MICRODISCECTOMY Right 01/23/2021   Procedure: RIGHT L5-S1 DECOMPRESSION;  Surgeon: Meade Maw, MD;  Location: [facility] ORS;  Service: Neurosurgery;  Laterality: Right;  thyroid gland     parathyroid   TUBAL LIGATION       Medications  I have reviewed the current medication list. Refer to chart for details.  Current Outpatient Medications  Medication Instructions   acetaminophen (TYLENOL) 650 mg, Oral, Every 4 hours PRN   carvedilol (COREG) 12.5 mg, 2 times daily with meals   cetirizine (ZYRTEC) 10 mg, Oral, BH-each morning   colestipol (COLESTID) 2 g, Oral, Daily   Fiber Diet TABS 2 tablets, Oral, 2 times  daily   gabapentin (NEURONTIN) 300 mg, Oral, Daily at bedtime   glucose blood test strip 1 each by Finger Stick route 4 times daily (after meals and nightly). Use as directed   HumaLOG 4-12 Units, Subcutaneous, 2 times daily with meals   hydrocortisone (ANUSOL-HC) 2.5 % rectal cream 1 application., Daily PRN   Hydrocortisone Acetate 2.5 % CREA Topical   INSULIN SYRINGE 1CC/29G 29G X 1/2" 1 ML MISC No dose, route, or frequency recorded.   Lantus SoloStar 10 Units, Subcutaneous, Daily at bedtime   lidocaine (LIDODERM) 5 % 1 patch, Transdermal, Daily PRN   methocarbamol (ROBAXIN) 500 mg, Oral, Every 8 hours PRN   multivitamin (RENA-VIT) TABS tablet 1 tablet, Oral, Every morning   ondansetron (ZOFRAN) 4 MG tablet Take 1 tablet (4 MG) before prep starts. Take 3 tablets (9 MG) as needed 6 hours after for nausea.   pantoprazole (PROTONIX) 40 mg, Oral, Daily   sevelamer carbonate (RENVELA) 1,600 mg, Oral, 2 times daily with meals   SURE COMFORT PEN NEEDLES 31G X 8 MM MISC No dose, route, or frequency recorded.   Vitamin D 2,000 Units, Oral, Every M-W-F     Allergies Allergies  Allergen Reactions   Succinylcholine Other (See Comments)    Severe myalgias   Penicillins Rash    Tolerated cefazolin prior   Does patient have contrast allergy: No     Physical Exam Current Vitals Temp: 98 F (36.7 C) (Temp Source: Axillary)  Pulse Rate: 97  Resp: (!) 25  BP: (!) 118/54  SpO2: 100 %  Height:  (unable to get weight. bed scale is error)  Weight: 48.7 kg  Body mass index is 19.02 kg/m.  General: Ill-appearing  Cardiac: Regular rate.  Pulmonary: Normal work of breathing.  Abdominal: PD catheter in place.   Pertinent Lab Results Labs: CBC: WBC/Hgb/Hct/Plts:  17.6/10.2/32.9/386 (05/26 0400)  BMP: BUN/Cr/glu/ALT/AST/amyl/lip:  37/4.22/--/17/56/--/-- (05/26 0511) Coagulation: PT/INR/PTT:  --/1.1/-- (05/25 1804)  CBC Trends: Recent Labs    08/01/21 1804 08/02/21 0400  WBC 15.9*  17.6*  HGB 12.6 10.2*  HCT 39.9 32.9*  PLT 422* 386     Creatinine Trend: Recent Labs    08/01/21 1804 08/02/21 0115 08/02/21 0511  CREATININE 4.02* 4.39* 4.22*     Relevant and/or Recent Imaging: CT 05/25 - distended gallbladder with large stone  [place] 05/25 - distended gallbladder with large stone, (+) murphy's sign    Assessment & Plan Misty [name] is a 81 y.o. female seen in consultation for acute calculous cholecystitis. Patient has some signs of acute cholecystitis by imaging, although no wall thickening or secondary inflammatory signs. While a HIDA may provide for definitive diagnosis, given her overall fragile state and sepsis, I think it is more imperative to move quickly to cholecystotomy tube placement.   Discussed with family bedside, they are agreeable after discussion of risks and benefits.    Procedure Checklist:  Consent obtained: Risks of the procedure as well as the alternatives and risks  of each were explained to the patient and/or caregiver.  Consent for the procedure was obtained and is signed in the bedside chart Consent obtained from: Family Patient is appropriate candidate for sedation Yes ASA Classification: ASA 3 - Patient with moderate systemic disease with functional limitations NPO status: Yes   Code status:   Code Status: Full Code; patient daughter expressed desire for no intubation  Pre-procedural prep necessary: none      I spent a total of 20 Minutes  in face-to-face in clinical consultation, greater than 50% of which was spent on medical decision-making and counseling/coordinating care for acute cholecystitis.     Albin Felling, MD  Vascular and Interventional Radiology 08/02/2021 10:13 AM

## 2021-08-02 NOTE — Progress Notes (Signed)
PD catheter clean, dry and intact.  PD initiated. Machine alarmed: Low volume drain, called home choice operator and machine was bypass, Cycler then went into first fill.   After about 10 minutes. Machine alarmed with Slow flow patient. Called Home choice operator again. Trouble shoot machine, lines checked for kinks. Still alarming with slow flow patient.   Patient also just had a surgery in her abdomen this morning.   Operator asked to check the Drain and there are some fibrins. It might also be causing some clog inside the PD catheter. Home choice operator advised to just end the treatment since the machine will alarm every two minutes if continued due to low flow rate going into patient's  abdomen.   NP [place] notified and agreed to terminate treatment and will plan another PD treatment tomorrow. Also patient had a fill of 928 ml and she ordered to do manual drain. Manual drain done. Total drain: 943m

## 2021-08-02 NOTE — Progress Notes (Signed)
Patient clinically stable post 10 FR Chole tube placement per DR Franco Collet RN from CCU present with patient entire procedure maintaining Levophed/Precedex gtts.l given Fentanyl 25 mcg IV for procedure. report given with questions answered.

## 2021-08-02 NOTE — Consult Note (Signed)
[facility] Cardiology  CARDIOLOGY CONSULT NOTE  Patient ID: Misty [name] MRN: [id number] DOB/AGE: 13-May-1940 81 y.o.  Admit date: 08/01/2021 Referring Physician Merrilee Jansky Primary Physician Gayland Curry, MD Primary Cardiologist Nehemiah Massed Reason for Consultation Abnormal echo  HPI:  Misty [name] is a 81 year old female with history of mild aortic stenosis, HFpEF, ESRD on PD, hypertension, type 2 diabetes who was admitted to the hospital with cholangitis.  Cardiology is consulted for abnormal echo.  Patient has altered mental status and thus is unable to provide history.  Briefly for the last few days she has had nausea and vomiting and unable to take p.o.  She was eventually brought to the emergency department for these complaints at which time she was discovered to have severe sepsis due to cholangitis.  On arrival to the emergency department she was tachycardic, and initially hypertensive, though she subsequently developed hypotension.  Labs are notable for an elevated white blood cell count, and a lactate peaking at 9 and subsequently decreasing today.  Imaging was consistent with cholecystitis.  She subsequently underwent PERC drain placement.  Echocardiogram was completed today which showed apical predominant akinesis with preserved basal activity.  Review of systems complete and found to be negative unless listed above     Past Medical History:  Diagnosis Date   Anemia of chronic renal failure    Aortic valve stenosis 07/02/2017   a.) TTE 07/02/2017: 55-60%; mild AS with MPG 13.0 mmHg. b.) TTE 10/14/2019: EF >55%; MPG 23.0 mmHg. c.) TTE 06/23/2020: EF 50-55%; MPG 19.7 mmHg. d.) TTE 11/27/2020: EF >55%; MPG 18.0 mmHg.   Chronic lower back pain    Complication of anesthesia    a.) severe myalgias with succinylcholine use. b.) (+) difficult airway.   Diastolic dysfunction 78/29/5621   a.) TTE 07/02/2017; EF 55-60%; mild AR; G1DD. b.) TTE 06/23/2020: EF 50-55% with mild LVH; mild  to mod LA enlargement; mild AR, mild to mod MR; G2DD.   Difficult airway for intubation    a.) 08/08/2020 at [facility] --> Initial attempt with Sabra Heck 3, larynx was "rigid/fixed"; unable to visualize anything but epiglottis. Glidescope utilized to obtain a great view with VL. First attempt with Glidescope unsuccessful d/t anterior orientation. ETT withdrawn and a greater bend was placed. Second attempt was successful with effort. Patient sustained lip laceration during intubation attempts.   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   a.) TTE 11/03/2019: measured 3.7 cm   ESRD on peritoneal dialysis ([place])    GERD (gastroesophageal reflux disease)    Glaucoma    Heart murmur    HLD (hyperlipidemia)    Hyperparathyroidism ([place])    a.) s/p total parathyroidectomy   Hypertension    Hypertensive retinopathy    Lumbar radiculopathy    MGUS (monoclonal gammopathy of unknown significance)    Motion sickness    Nonproliferative diabetic retinopathy ([place])    Osteoarthritis    Peritoneal dialysis catheter in place [facility])    Personal history of chemotherapy    Personal history of radiation therapy    Rectal cancer ([place]) 2011   a.) Clinical stage T3N0. b.) s/p 3 cycles of neoadjuvant CI 5FU + XRT c.) s/p resection on 08/08/2009.   Spinal stenosis of lumbar region with neurogenic claudication    T2DM (type 2 diabetes mellitus) ([place])    TIA (transient ischemic attack)     Past Surgical History:  Procedure Laterality Date   BACK SURGERY     BREAST BIOPSY Left    bx/ clip-neg   COLON SURGERY  COLONOSCOPY N/A 05/03/2020   Procedure: COLONOSCOPY;  Surgeon: Lesly Rubenstein, MD;  Location: [facility] ENDOSCOPY;  Service: Endoscopy;  Laterality: N/A;   COLONOSCOPY WITH PROPOFOL N/A 07/02/2021   Procedure: COLONOSCOPY WITH PROPOFOL;  Surgeon: [name], MD;  Location: [facility] ENDOSCOPY;  Service: Gastroenterology;  Laterality: N/A;   coloretcal cancer     DIALYSIS/PERMA CATHETER REMOVAL N/A 10/01/2020    Procedure: DIALYSIS/PERMA CATHETER REMOVAL;  Surgeon: Algernon Huxley, MD;  Location: [place] CV LAB;  Service: Cardiovascular;  Laterality: N/A;   EMBOLIZATION N/A 06/06/2021   Procedure: EMBOLIZATION;  Surgeon: Algernon Huxley, MD;  Location: [place] CV LAB;  Service: Cardiovascular;  Laterality: N/A;   IR PERC CHOLECYSTOSTOMY  08/02/2021   LUMBAR LAMINECTOMY/DECOMPRESSION MICRODISCECTOMY Right 01/23/2021   Procedure: RIGHT L5-S1 DECOMPRESSION;  Surgeon: Meade Maw, MD;  Location: [facility] ORS;  Service: Neurosurgery;  Laterality: Right;   thyroid gland     parathyroid   TUBAL LIGATION      Medications Prior to Admission  Medication Sig Dispense Refill Last Dose   cetirizine (ZYRTEC) 10 MG tablet Take 10 mg by mouth every morning.   08/01/2021   Cholecalciferol (VITAMIN D) 50 MCG (2000 UT) tablet Take 2,000 Units by mouth every Monday, Wednesday, and Friday.   Past Week   colestipol (COLESTID) 1 g tablet Take 2 g by mouth daily.   Past Week   Fiber Diet TABS Take 2 tablets by mouth 2 (two) times daily.   Past Week   gabapentin (NEURONTIN) 300 MG capsule Take 1 capsule (300 mg total) by mouth at bedtime. (Patient taking differently: Take 300 mg by mouth 2 (two) times daily.) 30 capsule 1 07/31/2021   LANTUS SOLOSTAR 100 UNIT/ML Solostar Pen Inject 10 Units into the skin at bedtime.   07/31/2021   multivitamin (RENA-VIT) TABS tablet Take 1 tablet by mouth every morning.   Past Week   sevelamer carbonate (RENVELA) 800 MG tablet Take 1,600 mg by mouth 2 (two) times daily with a meal.   Past Week   acetaminophen (TYLENOL) 325 MG tablet Take 650 mg by mouth every 4 (four) hours as needed for moderate pain.   prn   carvedilol (COREG) 12.5 MG tablet Take 12.5 mg by mouth 2 (two) times daily with a meal. (Patient not taking: Reported on 07/01/2021)      glucose blood test strip 1 each by Finger Stick route 4 times daily (after meals and nightly). Use as directed      HUMALOG 100 UNIT/ML  injection Inject 4-12 Units into the skin 2 (two) times daily with a meal.   prn   hydrocortisone (ANUSOL-HC) 2.5 % rectal cream Apply 1 application. topically daily as needed. (Patient not taking: Reported on 07/01/2021)      Hydrocortisone Acetate 2.5 % CREA Apply topically.      INSULIN SYRINGE 1CC/29G 29G X 1/2" 1 ML MISC       lidocaine (LIDODERM) 5 % Place 1 patch onto the skin daily as needed (pain).   prn   methocarbamol (ROBAXIN) 500 MG tablet Take 1 tablet (500 mg total) by mouth every 8 (eight) hours as needed for muscle spasms. (Patient not taking: Reported on 07/01/2021) 90 tablet 0    ondansetron (ZOFRAN) 4 MG tablet Take 1 tablet (4 MG) before prep starts. Take 3 tablets (9 MG) as needed 6 hours after for nausea. (Patient not taking: Reported on 08/01/2021) 4 tablet 0 Completed Course   pantoprazole (PROTONIX) 40 MG tablet Take 1  tablet (40 mg total) by mouth daily. 30 tablet 0    SURE COMFORT PEN NEEDLES 31G X 8 MM MISC       Social History   Socioeconomic History   Marital status: Married    Spouse name: Not on file   Number of children: Not on file   Years of education: Not on file   Highest education level: Not on file  Occupational History   Not on file  Tobacco Use   Smoking status: Never   Smokeless tobacco: Never  Vaping Use   Vaping Use: Never used  Substance and Sexual Activity   Alcohol use: Never   Drug use: Never   Sexual activity: Not on file  Other Topics Concern   Not on file  Social History Narrative   Not on file   Social Determinants of Health   Financial Resource Strain: Not on file  Food Insecurity: Not on file  Transportation Needs: Not on file  Physical Activity: Not on file  Stress: Not on file  Social Connections: Not on file  Intimate Partner Violence: Not on file    Family History  Problem Relation Age of Onset   Lung disease Sister    Diabetes Mellitus II Paternal Grandmother    Breast cancer Neg Hx       Review of systems  complete and found to be negative unless listed above      PHYSICAL EXAM  General: Obtunded HEENT:  Normocephalic and atramatic Neck:  No JVD.  Lungs: Clear bilaterally to auscultation and percussion. Heart: HRRR. Distant heart sounds. 2/6 systolic murmur  Abdomen: Firm. Somehwhat decreased breath sounds.  Msk:  Back normal, normal gait. Normal strength and tone for age. Extremities: No clubbing, cyanosis or edema.   Neuro: Unable to assess Psych:  Unable to assess.   Labs:   Lab Results  Component Value Date   WBC 17.6 (H) 08/02/2021   HGB 10.2 (L) 08/02/2021   HCT 32.9 (L) 08/02/2021   MCV 96.2 08/02/2021   PLT 386 08/02/2021    Recent Labs  Lab 08/02/21 0511 08/02/21 1010 08/02/21 1206  NA 145   < > 144  K 3.5   < > 3.5  CL 105   < > 108  CO2 24   < > 26  BUN 37*   < > 36*  CREATININE 4.22*   < > 4.08*  CALCIUM 9.6   < > 9.3  PROT 6.1*  --   --   BILITOT 0.5  --   --   ALKPHOS 93  --   --   ALT 17  --   --   AST 56*  --   --   GLUCOSE 422*   < > 244*   < > = values in this interval not displayed.   No results found for: CKTOTAL, CKMB, CKMBINDEX, TROPONINI  Lab Results  Component Value Date   CHOL 270 (H) 08/01/2021   Lab Results  Component Value Date   HDL 35 (L) 08/01/2021   Lab Results  Component Value Date   LDLCALC 164 (H) 08/01/2021   Lab Results  Component Value Date   TRIG 359 (H) 08/01/2021   TRIG 355 (H) 08/01/2021   Lab Results  Component Value Date   CHOLHDL 7.7 08/01/2021   No results found for: LDLDIRECT    Radiology: CT ABDOMEN PELVIS WO CONTRAST  Result Date: 08/01/2021 CLINICAL DATA:  Nausea and vomiting EXAM: CT ABDOMEN AND PELVIS  WITHOUT CONTRAST TECHNIQUE: Multidetector CT imaging of the abdomen and pelvis was performed following the standard protocol without IV contrast. RADIATION DOSE REDUCTION: This exam was performed according to the departmental dose-optimization program which includes automated exposure control,  adjustment of the mA and/or kV according to patient size and/or use of iterative reconstruction technique. COMPARISON:  CTA abdomen and pelvis 06/05/2021 FINDINGS: Lower chest: Small right and trace left pleural effusions. Hazy and irregular densities in the lower lungs likely represent subsegmental atelectasis and possibly infiltrates, greater on the right. Hepatobiliary: Liver is normal in size with no focal mass identified. Gallbladder is significantly distended and contains a 2.6 cm calculus. No significant gallbladder wall thickening or surrounding inflammatory changes. No biliary ductal dilatation appreciated. Pancreas: Mild peripancreatic fat stranding at the tail the pancreas. No pancreatic ductal dilatation. Spleen: Normal size with several tiny calcified granulomas. Adrenals/Urinary Tract: Adrenal glands appear normal. Kidneys are atrophic with scattered likely vascular calcifications. No definite nephrolithiasis or hydronephrosis identified bilaterally. Urinary bladder appears normal. Stomach/Bowel: Tiny hiatal hernia. No bowel obstruction, free air or pneumatosis. No bowel wall edema identified. No evidence of acute appendicitis. Vascular/Lymphatic: Severe atherosclerotic disease including large nearly occlusive appearing calcified plaque in the mid abdominal aorta. No bulky lymphadenopathy identified. Reproductive: Small uterus with calcifications. Other: Small volume ascites mostly in the upper abdomen. Stable loculated fluid collection in the right pelvis which appears chronic and likely postsurgical in nature. Stable chronic area of fat necrosis in the mid abdomen near the level of the umbilicus. A percutaneous catheter is again seen with the tip coiled in the pelvis. Musculoskeletal: Advanced degenerative changes of the spine. No suspicious bony lesions visualized. IMPRESSION: 1. Mild peripancreatic edema which could be secondary to mild acute pancreatitis or related to the small volume ascites.  Correlate clinically. 2. Cholelithiasis and markedly distended gallbladder. No gallbladder wall thickening or surrounding inflammatory changes visualized. Correlate clinically and consider follow-up ultrasound if indicated. 3. Small right pleural effusion and trace left pleural effusion. Associated subsegmental atelectatic changes and possibly infiltrates in the lower lungs. 4. Severe atherosclerotic disease. 5. Other chronic findings as described. Electronically Signed   By: Ofilia Neas M.D.   On: 08/01/2021 19:57   CT Head Wo Contrast  Result Date: 08/01/2021 CLINICAL DATA:  Mental status change of unknown cause beginning 2 days ago. Vomiting. EXAM: CT HEAD WITHOUT CONTRAST TECHNIQUE: Contiguous axial images were obtained from the base of the skull through the vertex without intravenous contrast. RADIATION DOSE REDUCTION: This exam was performed according to the departmental dose-optimization program which includes automated exposure control, adjustment of the mA and/or kV according to patient size and/or use of iterative reconstruction technique. COMPARISON:  MRI 07/26/2021.  CT 06/04/2021 FINDINGS: Brain: No acute finding. Age related atrophy. Chronic small-vessel ischemic changes of the white matter. 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 No sign of acute infarction, mass lesion, hemorrhage, hydrocephalus or extra-axial collection. Vascular: There is atherosclerotic calcification of the major vessels at the base of the brain. Skull: Negative Sinuses/Orbits: Clear/normal Other: None IMPRESSION: No acute finding. Atrophy and chronic small-vessel ischemic changes as above, similar to previous exams. Electronically Signed   By: Nelson Chimes M.D.   On: 08/01/2021 18:50   MR BRAIN WO CONTRAST  Result Date: 07/26/2021 CLINICAL DATA:  Provided history: Memory loss or impairment. Additional history provided by scanning technologist: Body tremors for 3 months, memory difficulty,  multiple recent falls due to instability. EXAM: MRI HEAD WITHOUT CONTRAST TECHNIQUE: Multiplanar, multiecho  pulse sequences of the brain and surrounding structures were obtained without intravenous contrast. COMPARISON:  Head CT 06/04/2021. FINDINGS: Brain: Mild generalized parenchymal atrophy. Chronic lacunar infarcts within the callosal genu, right lentiform nucleus and bilateral thalami. Mild to moderate multifocal T2 FLAIR hyperintense signal abnormality within the cerebral white matter and pons, nonspecific but compatible with chronic small vessel ischemic disease. There is no acute infarct. No evidence of an intracranial mass. No chronic intracranial blood products. No extra-axial fluid collection. No midline shift. Vascular: Maintained flow voids within the proximal large arterial vessels. Skull and upper cervical spine: No focal suspicious marrow lesion. Incompletely assessed cervical spondylosis. Sinuses/Orbits: No mass or acute finding within the imaged orbits. Prior bilateral ocular lens replacement. Minimal mucosal thickening within the bilateral ethmoid sinuses. Other: Small-volume fluid within the right mastoid air cells. IMPRESSION: No evidence of acute intracranial abnormality. Chronic lacunar infarcts within the callosal genu, right lentiform nucleus and bilateral thalami. Mild-to-moderate chronic small vessel ischemic changes within the cerebral white matter and pons. Mild generalized parenchymal atrophy Minimal mucosal thickening within the bilateral ethmoid sinuses. Small-volume fluid within the right mastoid air cells. Electronically Signed   By: Kellie Simmering D.O.   On: 07/26/2021 19:07   IR Perc Cholecystostomy  Result Date: 08/02/2021 INDICATION: Acute calculus cholecystitis with sepsis EXAM: Placement of percutaneous cholecystostomy tube using ultrasound and fluoroscopic guidance MEDICATIONS: Per EMR ANESTHESIA/SEDATION: Local analgesia FLUOROSCOPY: Radiation Exposure Index (as provided  by the fluoroscopic device): 1.3 minutes (18 mGy) COMPLICATIONS: None immediate. PROCEDURE: Informed written consent was obtained from the patient after a thorough discussion of the procedural risks, benefits and alternatives. All questions were addressed. Maximal Sterile Barrier Technique was utilized including caps, mask, sterile gowns, sterile gloves, sterile drape, hand hygiene and skin antiseptic. A timeout was performed prior to the initiation of the procedure. The patient was placed supine on the exam table. The right upper quadrant was prepped and draped in the standard sterile fashion. Ultrasound of the right upper quadrant was performed for planning purposes. This again demonstrated distended gallbladder with large gallstone in a dependent position. An intercostal transhepatic approach was planned. Skin entry site was marked, and local analgesia was obtained with 1% lidocaine. Under ultrasound guidance, percutaneous access was obtained into the gallbladder via an intercostal transhepatic approach using a 21 gauge Chiba needle. Access was confirmed with visualization of needle tip within the gallbladder lumen, and free return of bile. An 018 Nitrex wire was then advanced through the access needle and coiled within the gallbladder lumen. A transition dilator was advanced over this wire, through which an antegrade cholecystogram was performed. Antegrade cholecystogram demonstrated appropriate location in the gallbladder lumen and gallbladder sludge with a large gallstone. Over an Amplatz wire, the percutaneous tract was serially dilated followed by placement of a 10 French locking multipurpose drainage catheter into the gallbladder lumen. Locking loop was formed. Additional biliary sludge was drained. Gentle hand injection of contrast material confirmed location of the gallbladder lumen. The drainage catheter was secured to the skin using silk suture and a dressing. It was placed to bag drainage. The patient  tolerated the procedure well without immediate complication. IMPRESSION: Successful placement of a 10 French percutaneous cholecystostomy drainage catheter via an intercostal transhepatic approach. Drain placed to bag drainage. Further follow-up recommendations per surgery. If drain remains after 6 weeks, patient to return to Interventional Radiology for routine catheter care. Electronically Signed   By: Albin Felling M.D.   On: 08/02/2021 11:28   DG Chest Portable  1 View  Result Date: 08/01/2021 CLINICAL DATA:  Altered mental status sepsis EXAM: PORTABLE CHEST 1 VIEW COMPARISON:  06/21/2020 FINDINGS: Borderline cardiac size. Vascular congestion. Heterogeneous asymmetric right greater than left interstitial and ground-glass opacity. No pleural effusion or pneumothorax. Aortic atherosclerosis. IMPRESSION: Asymmetric heterogeneous right greater than left interstitial and hazy pulmonary density which may be due to asymmetric edema or diffuse infection to include atypical process Electronically Signed   By: Donavan Foil M.D.   On: 08/01/2021 19:11   ECHOCARDIOGRAM COMPLETE  Result Date: 08/02/2021    ECHOCARDIOGRAM REPORT   Patient Name:   Misty [name] Date of Exam: 08/02/2021 Medical Rec #:  [id number]         Height:       63.0 in Accession #:    [id number]        Weight:       107.4 lb Date of Birth:  1941/01/06         BSA:          1.484 m Patient Age:    64 years          BP:           131/56 mmHg Patient Gender: F                 HR:           96 bpm. Exam Location:  [facility] Procedure: 2D Echo, Color Doppler and Cardiac Doppler Indications:     I50.31 congestive heart failure-Acute Diastolic  History:         Patient has prior history of Echocardiogram examinations. ESRD                  and TIA; Risk Factors:Hypertension, Diabetes and Dyslipidemia.  Sonographer:     Charmayne Sheer Referring Phys:  [id number] BRITTON L RUST[place]CHESTER Diagnosing Phys: Donnelly Angelica  Sonographer Comments: Suboptimal subcostal window.  IMPRESSIONS  1. Left ventricular ejection fraction, by estimation, is 35 to 40%. The left ventricle has moderately decreased function. The left ventricle demonstrates regional wall motion abnormalities (see scoring diagram/findings for description). Left ventricular  diastolic parameters are consistent with Grade I diastolic dysfunction (impaired relaxation). There is akinesis of the left ventricular, entire apical segment.  2. Right ventricular systolic function is normal. The right ventricular size is normal.  3. The mitral valve is normal in structure. Mild mitral valve regurgitation. No evidence of mitral stenosis.  4. The aortic valve is calcified. Aortic valve regurgitation is moderate. Mild aortic valve stenosis. Aortic regurgitation PHT measures 242 msec.  5. Aortic dilatation noted. There is mild dilatation of the ascending aorta, measuring 38 mm. Comparison(s): Significant apical WMA in multiple vascular territories (? stress cardiomyopathy). AI appears worse. FINDINGS  Left Ventricle: Left ventricular ejection fraction, by estimation, is 35 to 40%. The left ventricle has moderately decreased function. The left ventricle demonstrates regional wall motion abnormalities. The left ventricular internal cavity size was normal in size. There is no left ventricular hypertrophy. Left ventricular diastolic parameters are consistent with Grade I diastolic dysfunction (impaired relaxation). Right Ventricle: The right ventricular size is normal. Right vetricular wall thickness was not well visualized. Right ventricular systolic function is normal. Left Atrium: Left atrial size was normal in size. Right Atrium: Right atrial size was normal in size. Pericardium: There is no evidence of pericardial effusion. Mitral Valve: The mitral valve is normal in structure. Mild mitral valve regurgitation. No evidence of mitral valve stenosis. MV peak gradient, 3.7 mmHg.  The mean mitral valve gradient is 2.0 mmHg. Tricuspid Valve:  The tricuspid valve is normal in structure. Tricuspid valve regurgitation is mild. Aortic Valve: The aortic valve is calcified. Aortic valve regurgitation is moderate. Aortic regurgitation PHT measures 242 msec. Mild aortic stenosis is present. Aortic valve mean gradient measures 10.0 mmHg. Aortic valve peak gradient measures 18.3 mmHg. Aortic valve area, by VTI measures 1.43 cm. Pulmonic Valve: The pulmonic valve was normal in structure. Aorta: Aortic dilatation noted. There is mild dilatation of the ascending aorta, measuring 38 mm. Venous: The inferior vena cava was not well visualized.  LEFT VENTRICLE PLAX 2D LVIDd:         4.03 cm      Diastology LVIDs:         3.10 cm      LV e' medial:    4.13 cm/s LV PW:         1.09 cm      LV E/e' medial:  11.9 LV IVS:        1.03 cm      LV e' lateral:   5.44 cm/s LVOT diam:     2.00 cm      LV E/e' lateral: 9.0 LV SV:         47 LV SV Index:   32 LVOT Area:     3.14 cm  LV Volumes (MOD) LV vol d, MOD A2C: 101.0 ml LV vol d, MOD A4C: 95.2 ml LV vol s, MOD A2C: 77.5 ml LV vol s, MOD A4C: 47.8 ml LV SV MOD A2C:     23.5 ml LV SV MOD A4C:     95.2 ml LV SV MOD BP:      41.1 ml RIGHT VENTRICLE RV Basal diam:  2.03 cm LEFT ATRIUM           Index        RIGHT ATRIUM          Index LA diam:      3.50 cm 2.36 cm/m   RA Area:     8.73 cm LA Vol (A2C): 28.4 ml 19.13 ml/m  RA Volume:   16.20 ml 10.91 ml/m LA Vol (A4C): 33.8 ml 22.77 ml/m  AORTIC VALVE                     PULMONIC VALVE AV Area (Vmax):    1.37 cm      PV Vmax:       0.73 m/s AV Area (Vmean):   1.27 cm      PV Vmean:      46.300 cm/s AV Area (VTI):     1.43 cm      PV VTI:        0.100 m AV Vmax:           214.00 cm/s   PV Peak grad:  2.1 mmHg AV Vmean:          147.000 cm/s  PV Mean grad:  1.0 mmHg AV VTI:            0.332 m AV Peak Grad:      18.3 mmHg AV Mean Grad:      10.0 mmHg LVOT Vmax:         93.30 cm/s LVOT Vmean:        59.500 cm/s LVOT VTI:          0.151 m LVOT/AV VTI ratio: 0.45 AI PHT:  242 msec  AORTA Ao Root diam: 3.00 cm MITRAL VALVE               TRICUSPID VALVE MV Area (PHT): 7.66 cm    TR Peak grad:   27.5 mmHg MV Area VTI:   3.70 cm    TR Vmax:        262.00 cm/s MV Peak grad:  3.7 mmHg MV Mean grad:  2.0 mmHg    SHUNTS MV Vmax:       0.96 m/s    Systemic VTI:  0.15 m MV Vmean:      58.5 cm/s   Systemic Diam: 2.00 cm MV Decel Time: 99 msec MV E velocity: 49.20 cm/s MV A velocity: 85.90 cm/s MV E/A ratio:  0.57 Donnelly Angelica Electronically signed by Donnelly Angelica Signature Date/Time: 08/02/2021/1:47:52 PM    Final    US ABDOMEN LIMITED RUQ (LIVER/GB)  Result Date: 08/01/2021 CLINICAL DATA:  epigastric pain, sepsis EXAM: ULTRASOUND ABDOMEN LIMITED RIGHT UPPER QUADRANT COMPARISON:  CT 08/01/2021 FINDINGS: Gallbladder: Dilated gallbladder with large intraluminal stone which measures up to 2.6 cm. No wall thickening or pericholecystic fluid. Reportedly positive sonographic Murphy sign. Common bile duct: Diameter: 4.6 mm, normal.  No intrahepatic biliary ductal dilation. Liver: Diffusely increased liver echogenicity. Portal vein is patent on color Doppler imaging with normal direction of blood flow towards the liver. Other: Right pleural effusion. IMPRESSION: Dilated gallbladder with large intraluminal stone measuring up to 2.6 cm as seen on recent CT. No wall thickening or pericholecystic fluid. Reportedly positive sonographic Murphy sign, which is nonspecific in the setting of suspected pancreatitis. Correlate with exam and laboratory findings. Diffuse hepatic steatosis. Electronically Signed   By: Maurine Simmering M.D.   On: 08/01/2021 21:37    EKG: Sinus tachycardia.  Anterior Q waves  ASSESSMENT AND PLAN:  Misty [name] is a 81 year old female with history of mild aortic stenosis, HFpEF, ESRD on PD, hypertension, type 2 diabetes who was admitted to the hospital with cholangitis.  Cardiology is consulted for abnormal echo.  #Apical predominant akinesis with preserved basal  activity-consider stress cardiomyopathy versus LAD infarct/ischemia # Acute HR with reduced EF #Severe sepsis due to cholangitis Patient was admitted with severe sepsis due to cholangitis with a lactate greater than 9, in the setting was discovered to have a newly depressed ejection fraction with apical predominant akinesis.  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  EKG shows Q waves anteriorly. -Recommend checking troponin now -Monitor on telemetry while inpatient -Start aspirin 81 mg  -Heparin- will decide pending troponin -Wean pressors as tolerated as this can worsen Takotsubo -We will consider ischemic evaluation pending clinical course.   Signed: Andrez Grime MD 08/02/2021, 2:34 PM

## 2021-08-03 ENCOUNTER — Encounter: Payer: Self-pay | Admitting: Radiology

## 2021-08-03 ENCOUNTER — Inpatient Hospital Stay: Payer: Medicare Other

## 2021-08-03 DIAGNOSIS — A419 Sepsis, unspecified organism: Secondary | ICD-10-CM

## 2021-08-03 DIAGNOSIS — Z515 Encounter for palliative care: Secondary | ICD-10-CM

## 2021-08-03 DIAGNOSIS — I639 Cerebral infarction, unspecified: Secondary | ICD-10-CM

## 2021-08-03 DIAGNOSIS — J9601 Acute respiratory failure with hypoxia: Secondary | ICD-10-CM

## 2021-08-03 LAB — HEPATIC FUNCTION PANEL
ALT: 56 U/L — ABNORMAL HIGH (ref 0–44)
AST: 205 U/L — ABNORMAL HIGH (ref 15–41)
Albumin: 1.9 g/dL — ABNORMAL LOW (ref 3.5–5.0)
Alkaline Phosphatase: 67 U/L (ref 38–126)
Bilirubin, Direct: <0.1 mg/dL (ref 0.0–0.2)
Total Bilirubin: 0.7 mg/dL (ref 0.3–1.2)
Total Protein: 5 g/dL — ABNORMAL LOW (ref 6.5–8.1)

## 2021-08-03 LAB — CBC
HCT: 29.2 % — ABNORMAL LOW (ref 36.0–46.0)
Hemoglobin: 9.2 g/dL — ABNORMAL LOW (ref 12.0–15.0)
MCH: 30.6 pg (ref 26.0–34.0)
MCHC: 31.5 g/dL (ref 30.0–36.0)
MCV: 97 fL (ref 80.0–100.0)
Platelets: 188 10*3/uL (ref 150–400)
RBC: 3.01 MIL/uL — ABNORMAL LOW (ref 3.87–5.11)
RDW: 17 % — ABNORMAL HIGH (ref 11.5–15.5)
WBC: 14.9 10*3/uL — ABNORMAL HIGH (ref 4.0–10.5)
nRBC: 0.1 % (ref 0.0–0.2)

## 2021-08-03 LAB — GLUCOSE, CAPILLARY
Glucose-Capillary: 169 mg/dL — ABNORMAL HIGH (ref 70–99)
Glucose-Capillary: 167 mg/dL — ABNORMAL HIGH (ref 70–99)
Glucose-Capillary: 209 mg/dL — ABNORMAL HIGH (ref 70–99)
Glucose-Capillary: 174 mg/dL — ABNORMAL HIGH (ref 70–99)
Glucose-Capillary: 154 mg/dL — ABNORMAL HIGH (ref 70–99)

## 2021-08-03 LAB — BASIC METABOLIC PANEL
Anion gap: 13 (ref 5–15)
BUN: 45 mg/dL — ABNORMAL HIGH (ref 8–23)
CO2: 23 mmol/L (ref 22–32)
Calcium: 8.6 mg/dL — ABNORMAL LOW (ref 8.9–10.3)
Chloride: 109 mmol/L (ref 98–111)
Creatinine, Ser: 4.86 mg/dL — ABNORMAL HIGH (ref 0.44–1.00)
GFR, Estimated: 9 mL/min — ABNORMAL LOW (ref >60)
Glucose, Bld: 202 mg/dL — ABNORMAL HIGH (ref 70–99)
Potassium: 4.9 mmol/L (ref 3.5–5.1)
Sodium: 145 mmol/L (ref 135–145)

## 2021-08-03 LAB — HEMOGLOBIN A1C
Hgb A1c MFr Bld: 7.4 % — ABNORMAL HIGH (ref 4.8–5.6)
Mean Plasma Glucose: 165.68 mg/dL

## 2021-08-03 LAB — PHOSPHORUS: Phosphorus: 7.8 mg/dL — ABNORMAL HIGH (ref 2.5–4.6)

## 2021-08-03 LAB — MAGNESIUM: Magnesium: 2.5 mg/dL — ABNORMAL HIGH (ref 1.7–2.4)

## 2021-08-03 LAB — HEPARIN LEVEL (UNFRACTIONATED): Heparin Unfractionated: <0.1 IU/mL — ABNORMAL LOW (ref 0.30–0.70)

## 2021-08-03 IMAGING — MR MR HEAD W/O CM
12 series · 44 of 48 positions shown · non-contrast
Comparison: None Available.

CLINICAL DATA: Mental status change, stroke suspected

EXAM:
MRI HEAD WITHOUT CONTRAST
MRA HEAD WITHOUT CONTRAST
TECHNIQUE: Multiplanar, multi-echo pulse sequences of the brain and surrounding
structures were acquired without intravenous contrast. Angiographic
images of the Circle of Willis were acquired using MRA technique
without intravenous contrast.

[Series 5: ax dwi_tracew · axial · 3.0mm · 0.65mm/px · z∈[-71,+64]mm · 4 of 42 slices shown]
[im 1/42]
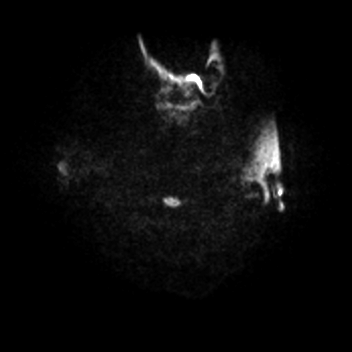
[im 14/42]
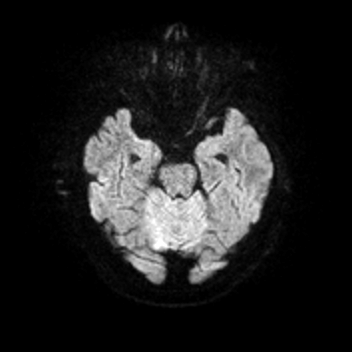
[im 28/42]
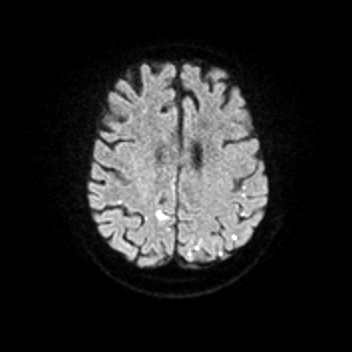
[im 42/42]
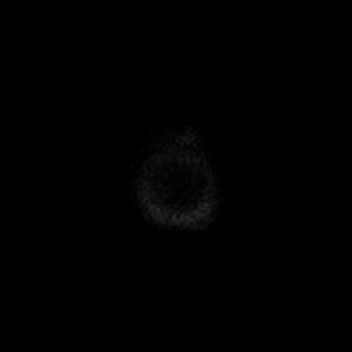

[Series 6: ax dwi_adc · axial · 3.0mm · 0.65mm/px · z∈[-71,+61]mm · 3 of 40 slices shown]
[im 1/40]
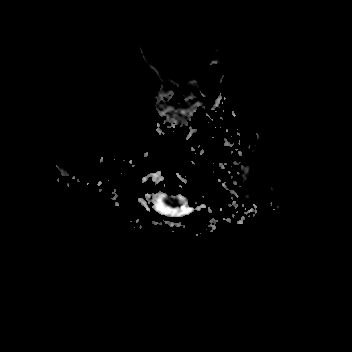
[im 20/40]
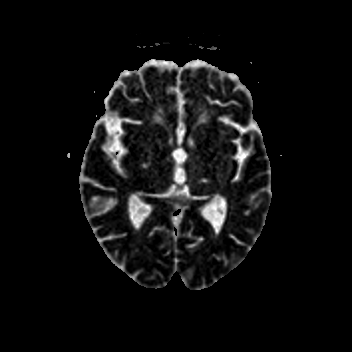
[im 40/40]
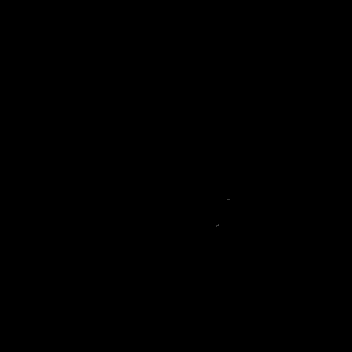

[Series 7: cor dwi_tracew · coronal · 5.0mm · 0.60mm/px · 2 of 32 slices shown]
[im 1/32]
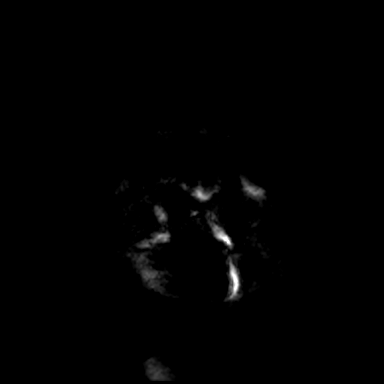
[im 32/32]
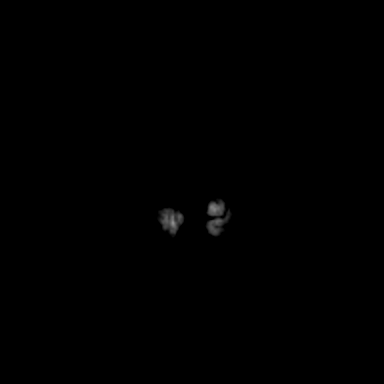

[Series 8: cor dwi_adc · coronal · 5.0mm · 0.60mm/px · 2 of 29 slices shown]
[im 1/29]
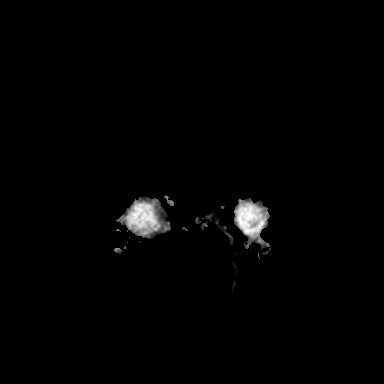
[im 29/29]
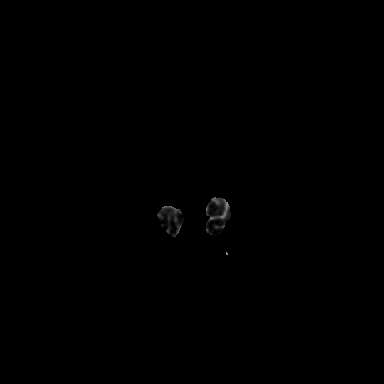

[Series 9: T1 · sagittal · 5.0mm · 0.62mm/px · 2 of 20 slices shown (1 of 2)]
[im 1/20]
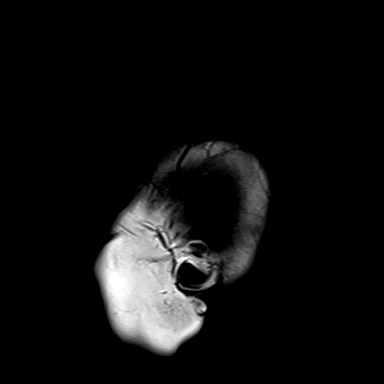
[im 20/20]
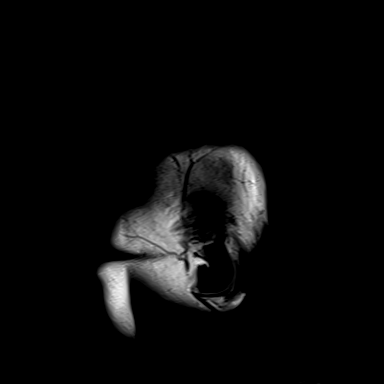

[Series 10: T2 · axial · 5.0mm · 0.53mm/px · z∈[-67,+59]mm · 2 of 22 slices shown (1 of 2)]
[im 1/22]
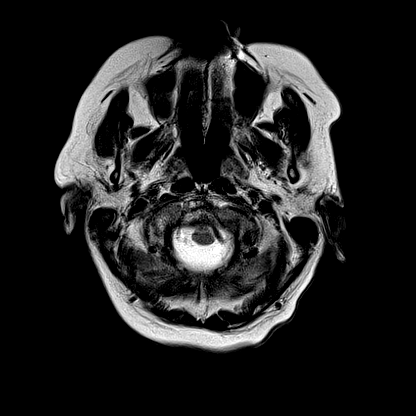
[im 22/22]
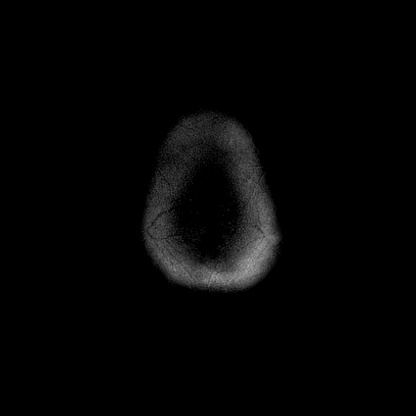

[Series 11: ax swi_mag · axial · 2.0mm · 0.98mm/px · z∈[-83,+75]mm · 6 of 80 slices shown]
[im 1/80]
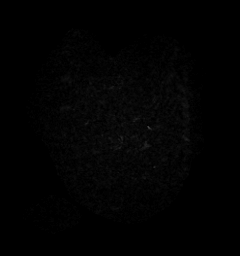
[im 16/80]
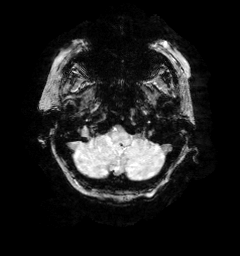
[im 32/80]
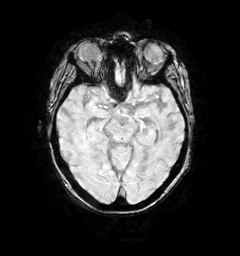
[im 48/80]
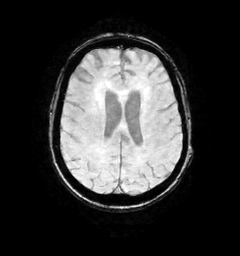
[im 64/80]
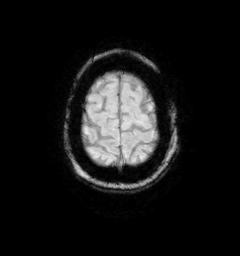
[im 80/80]
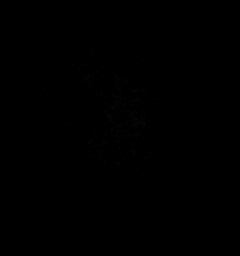

[Series 12: ax swi_pha · axial · 2.0mm · 0.98mm/px · z∈[-83,+75]mm · 6 of 80 slices shown]
[im 1/80]
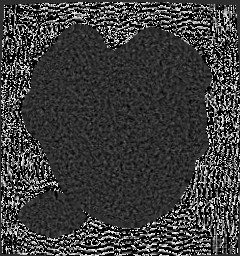
[im 16/80]
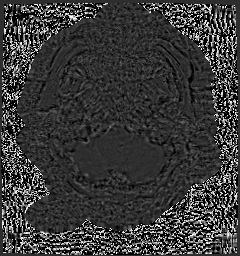
[im 32/80]
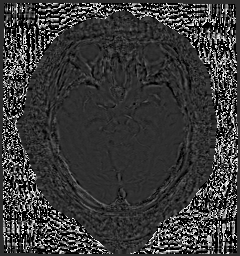
[im 48/80]
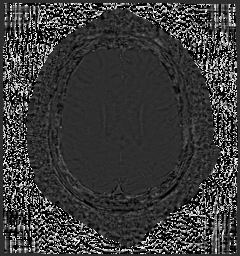
[im 64/80]
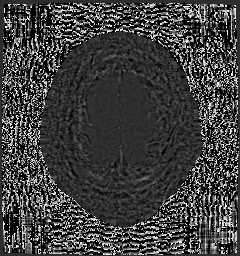
[im 80/80]
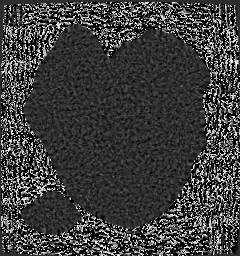

[Series 13: ax swi_swi · axial · 2.0mm · 0.98mm/px · z∈[-83,+11]mm · 4 of 80 slices shown]
[im 1/80]
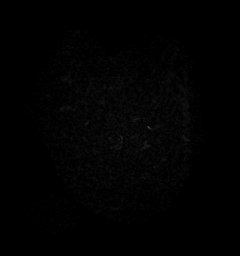
[im 16/80]
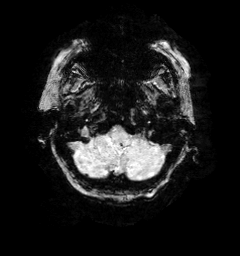
[im 32/80]
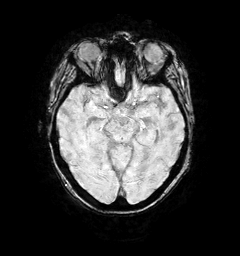
[im 48/80]
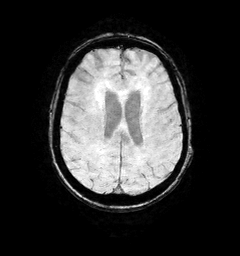

[Series 15: FLAIR · axial · 3.0mm · 0.53mm/px · z∈[-68,+61]mm · 3 of 44 slices shown]
[im 1/44]
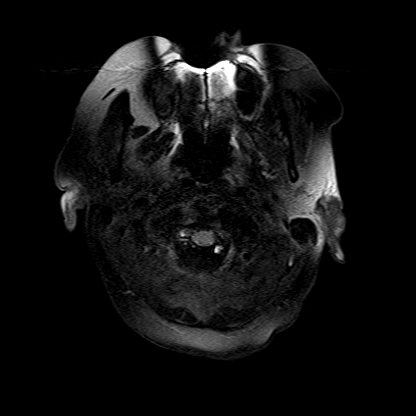
[im 22/44]
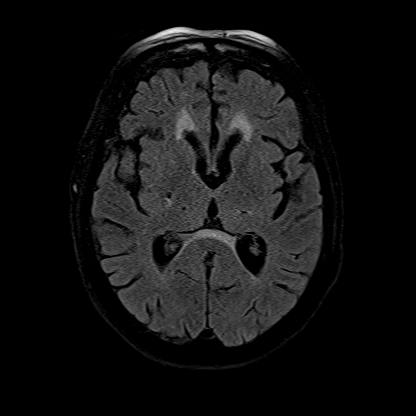
[im 44/44]
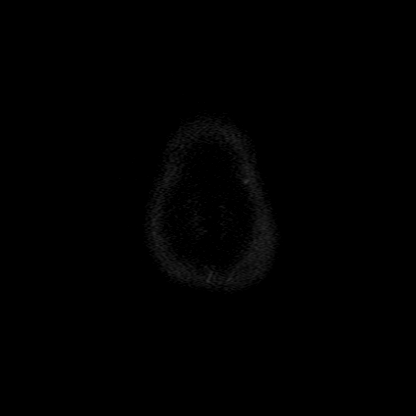

[Series 16: T1 · axial · 1.0mm · 0.98mm/px · z∈[-67,+60]mm · 8 of 128 slices shown (2 of 2)]
[im 1/128]
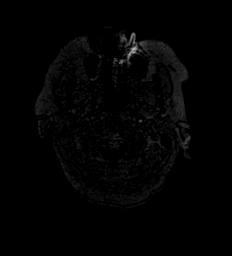
[im 15/128]
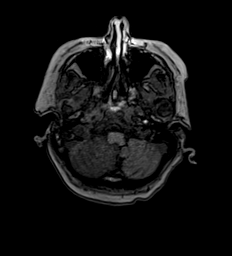
[im 43/128]
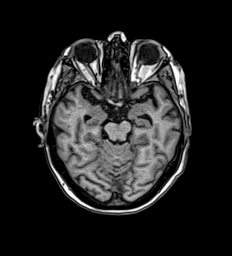
[im 57/128]
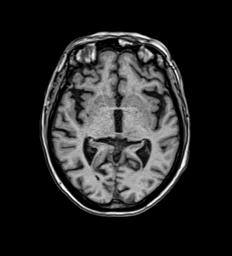
[im 71/128]
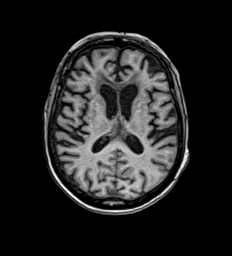
[im 85/128]
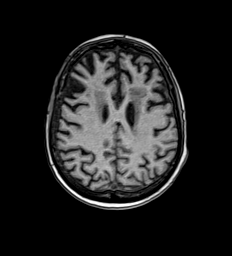
[im 113/128]
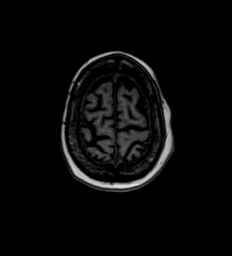
[im 128/128]
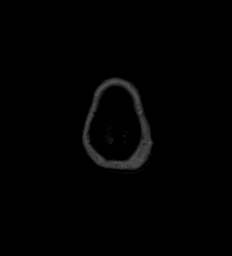

[Series 17: T2 · coronal · 5.0mm · 0.57mm/px · 2 of 24 slices shown (2 of 2)]
[im 1/24]
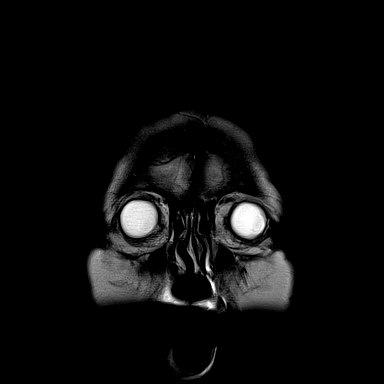
[im 24/24]
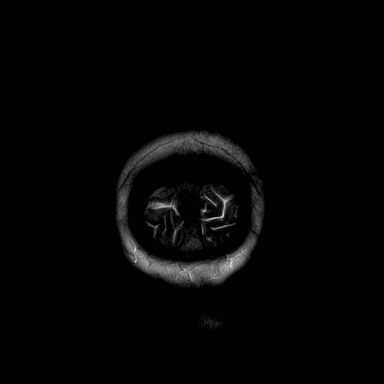

[44 of 48 positions shown; findings below may reference images not displayed]

FINDINGS: MRI HEAD FINDINGS

Brain: Moderate to large area of restricted diffusion with ADC
correlate in the inferior right cerebellum (series 5, image 10), in
the right PICA territory, measuring approximately 4.0 x 5.7 x 2.8 cm
(AP x TR x CC) (series 5, image 10 and series 7, image 11). This
causes mild mass effect on the left cerebral hemisphere. The fourth
ventricle is likely narrowed but remains patent. No hydrocephalus.

Additional scattered small areas and punctate foci of restricted
diffusion are seen in the bilateral frontal lobes, parietal lobes,
and occipital lobes, as well as the left temporal lobe and possibly
the left fornix. These areas are predominantly cortical.

No acute hemorrhage, mass, or cerebral midline shift. No extra-axial
collection.

Vascular: Loss of the right vertebral artery flow void. Otherwise
normal arterial flow voids.

Skull and upper cervical spine: Normal marrow signal.

Sinuses/Orbits: No acute finding. Status post bilateral lens
replacements.

Other: Fluid in right mastoid air cells.

MRA HEAD FINDINGS

Anterior circulation: Both internal carotid arteries are patent to
the termini, without significant stenosis.

A1 segments patent, with a hypoplastic or mildly stenotic left A1.
Normal anterior communicating artery. Intermittent poor flow in the
right A2 and A3 segments anterior cerebral arteries are patent to
their distal aspects.

No M1 stenosis or occlusion. Normal MCA bifurcations. Multifocal
mild stenosis in several right M2 and M3 branches (series 1, images
120-144 and series 1, images 147-156) and several left M2 and M3
branches (series 1, images 165-174 and series 1, images 137-159).
Distal MCA branches otherwise perfused.

Posterior circulation: Intermittent poor signal in the proximal
right V4 segment, with better signal distally, likely retrograde
flow. The left vertebral artery is patent to the vertebrobasilar
junction. The left posteroinferior cerebellar artery is patent. The
right PICA is not visualized.

Basilar patent to its distal aspect. Superior cerebellar arteries
patent proximally.

Fetal origin of the left PCA without a definite left P1. Patent
right P1. PCAs perfused to their distal aspects without stenosis.
The right posterior communicating artery is not visualized.

Anatomic variants: Fetal origin of the left PCA.
IMPRESSION: 1. Moderate to large area of acute infarct in the inferior right
cerebellum, in the right PICA territory. On the MRA, the right
vertebral artery demonstrates particularly poor signal proximally in
the right PICA is not visualized.
2. Additional scattered, primarily cortical, acute infarcts in the
bilateral frontal lobes, parietal lobes, and occipital lobes, as
well as the left temporal lobe.
3. Multifocal mild narrowing in the right ACA and bilateral MCAs.

## 2021-08-03 IMAGING — MR MR MRA HEAD W/O CM
1 series · 25 of 48 positions shown · non-contrast
Comparison: None Available.

CLINICAL DATA: Mental status change, stroke suspected

EXAM:
MRI HEAD WITHOUT CONTRAST
MRA HEAD WITHOUT CONTRAST
TECHNIQUE: Multiplanar, multi-echo pulse sequences of the brain and surrounding
structures were acquired without intravenous contrast. Angiographic
images of the Circle of Willis were acquired using MRA technique
without intravenous contrast.

[Series 1: TOF · axial · 0.5mm · 0.48mm/px · z∈[-86,+11]mm · 25 of 217 slices shown]
[im 1/217]
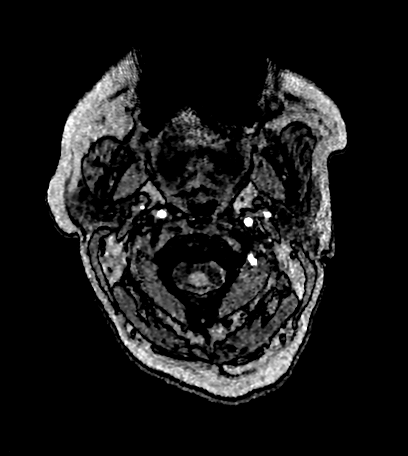
[im 5/217]
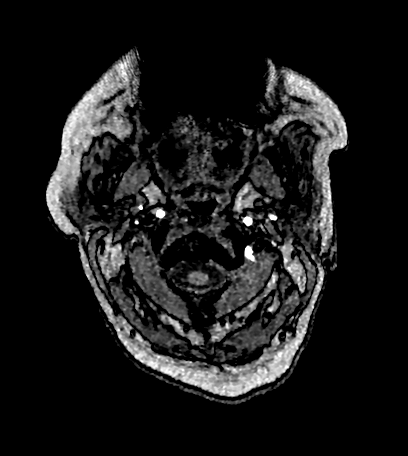
[im 10/217]
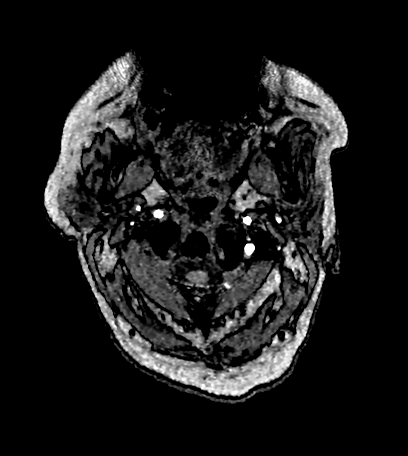
[im 14/217]
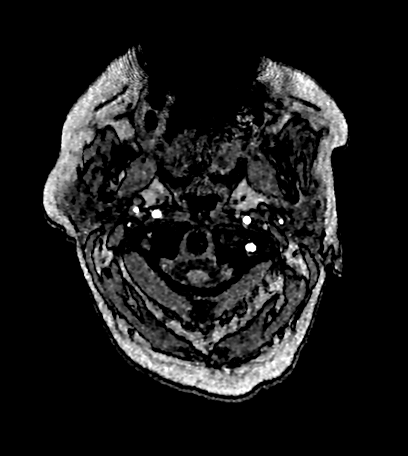
[im 19/217]
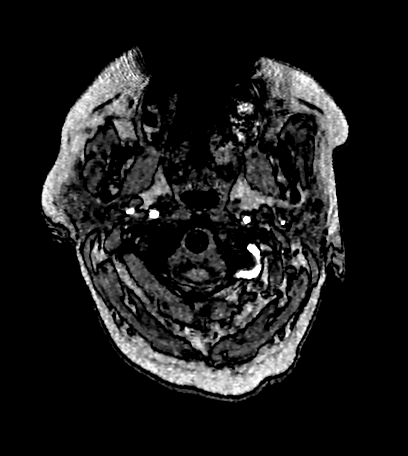
[im 23/217]
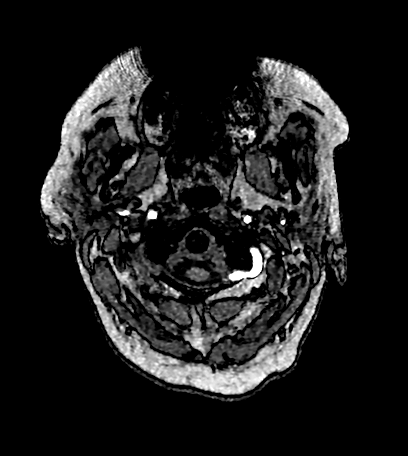
[im 28/217]
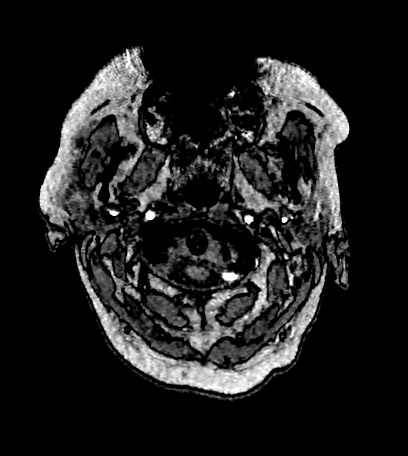
[im 33/217]
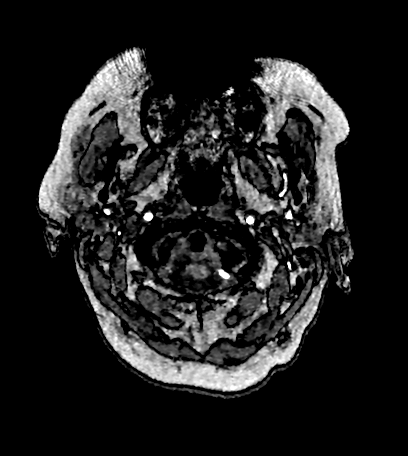
[im 37/217]
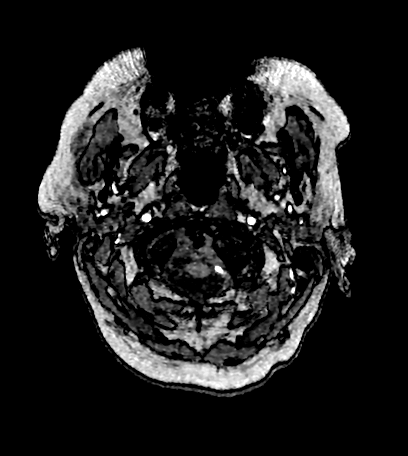
[im 42/217]
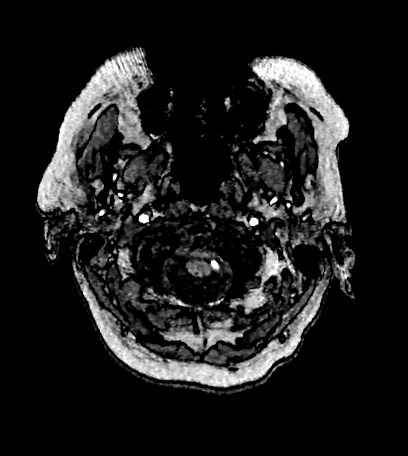
[im 46/217]
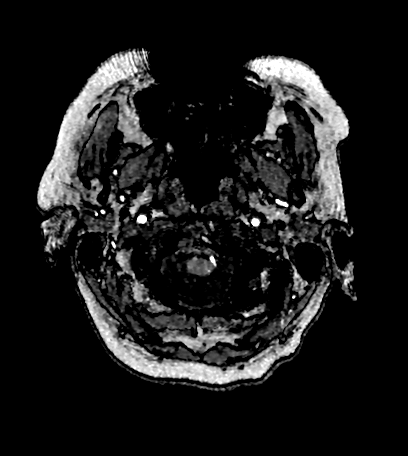
[im 51/217]
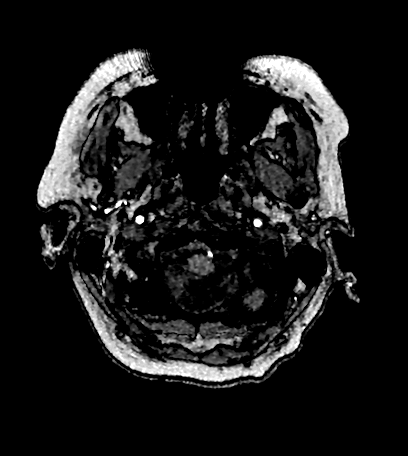
[im 56/217]
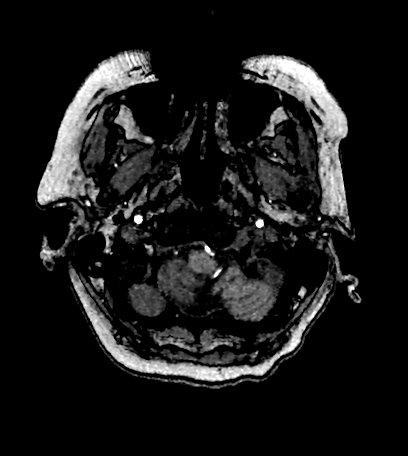
[im 60/217]
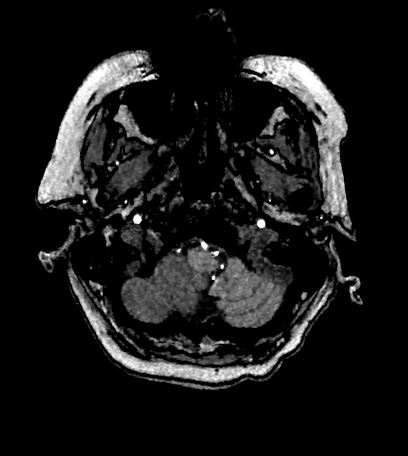
[im 65/217]
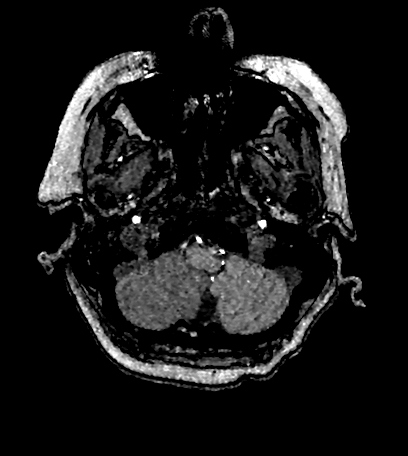
[im 69/217]
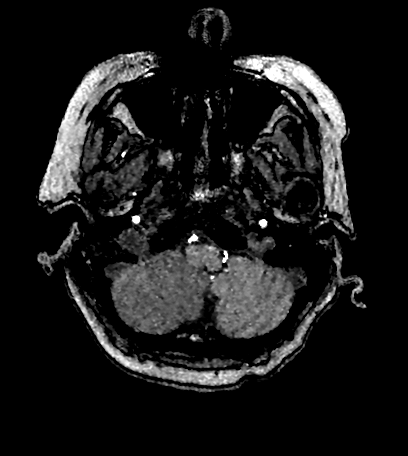
[im 74/217]
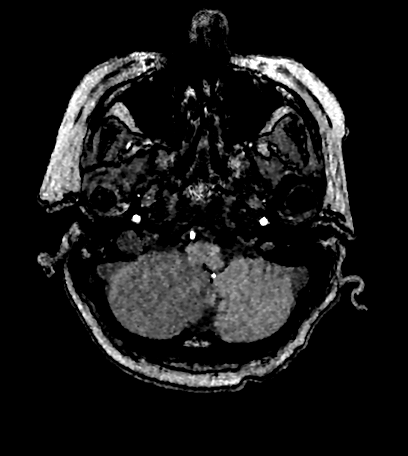
[im 79/217]
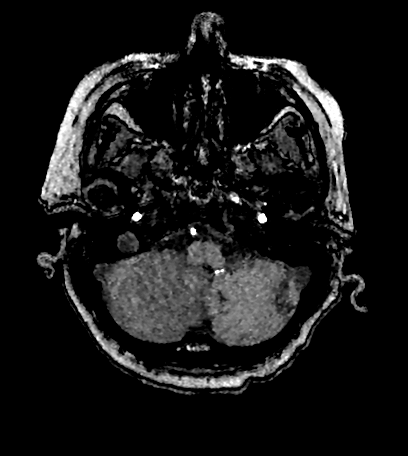
[im 97/217]
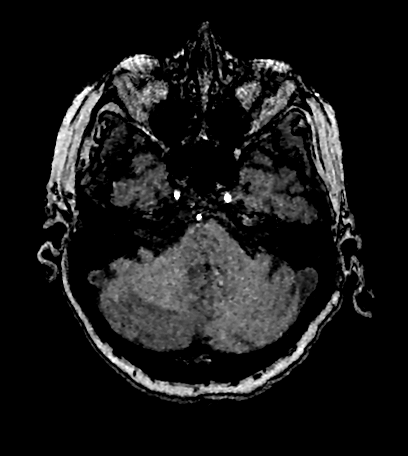
[im 111/217]
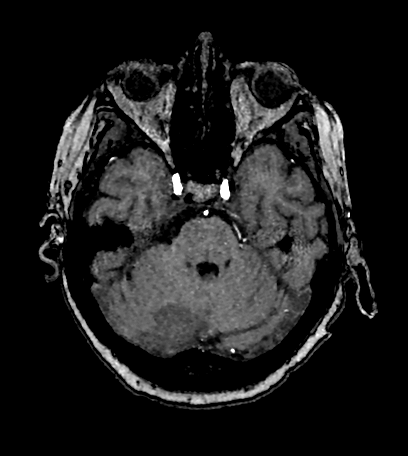
[im 125/217]
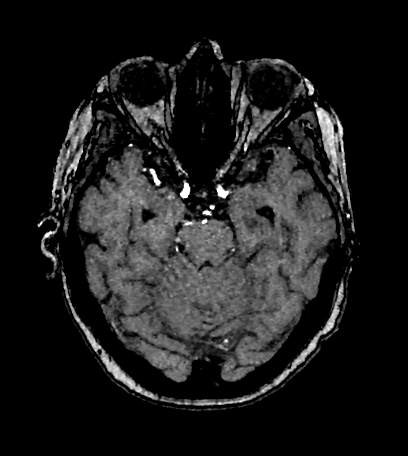
[im 152/217]
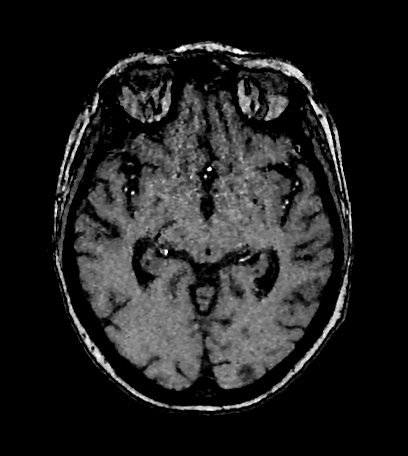
[im 180/217]
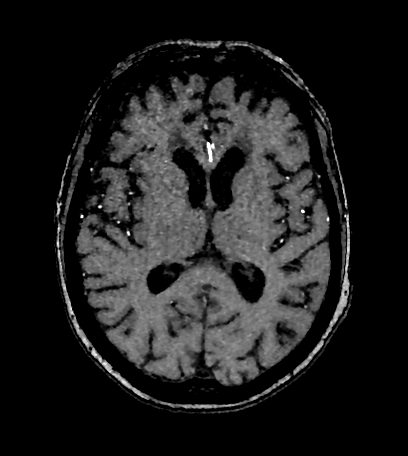
[im 184/217]
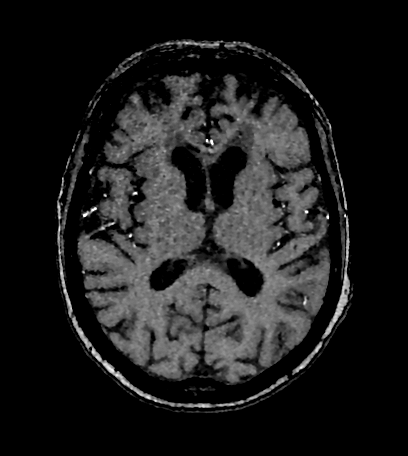
[im 207/217]
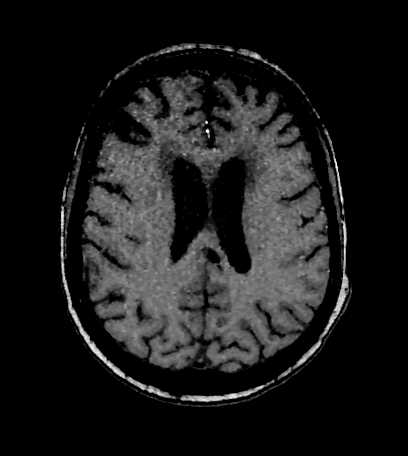

[25 of 48 positions shown; findings below may reference images not displayed]

FINDINGS: MRI HEAD FINDINGS

Brain: Moderate to large area of restricted diffusion with ADC
correlate in the inferior right cerebellum (series 5, image 10), in
the right PICA territory, measuring approximately 4.0 x 5.7 x 2.8 cm
(AP x TR x CC) (series 5, image 10 and series 7, image 11). This
causes mild mass effect on the left cerebral hemisphere. The fourth
ventricle is likely narrowed but remains patent. No hydrocephalus.

Additional scattered small areas and punctate foci of restricted
diffusion are seen in the bilateral frontal lobes, parietal lobes,
and occipital lobes, as well as the left temporal lobe and possibly
the left fornix. These areas are predominantly cortical.

No acute hemorrhage, mass, or cerebral midline shift. No extra-axial
collection.

Vascular: Loss of the right vertebral artery flow void. Otherwise
normal arterial flow voids.

Skull and upper cervical spine: Normal marrow signal.

Sinuses/Orbits: No acute finding. Status post bilateral lens
replacements.

Other: Fluid in right mastoid air cells.

MRA HEAD FINDINGS

Anterior circulation: Both internal carotid arteries are patent to
the termini, without significant stenosis.

A1 segments patent, with a hypoplastic or mildly stenotic left A1.
Normal anterior communicating artery. Intermittent poor flow in the
right A2 and A3 segments anterior cerebral arteries are patent to
their distal aspects.

No M1 stenosis or occlusion. Normal MCA bifurcations. Multifocal
mild stenosis in several right M2 and M3 branches (series 1, images
120-144 and series 1, images 147-156) and several left M2 and M3
branches (series 1, images 165-174 and series 1, images 137-159).
Distal MCA branches otherwise perfused.

Posterior circulation: Intermittent poor signal in the proximal
right V4 segment, with better signal distally, likely retrograde
flow. The left vertebral artery is patent to the vertebrobasilar
junction. The left posteroinferior cerebellar artery is patent. The
right PICA is not visualized.

Basilar patent to its distal aspect. Superior cerebellar arteries
patent proximally.

Fetal origin of the left PCA without a definite left P1. Patent
right P1. PCAs perfused to their distal aspects without stenosis.
The right posterior communicating artery is not visualized.

Anatomic variants: Fetal origin of the left PCA.
IMPRESSION: 1. Moderate to large area of acute infarct in the inferior right
cerebellum, in the right PICA territory. On the MRA, the right
vertebral artery demonstrates particularly poor signal proximally in
the right PICA is not visualized.
2. Additional scattered, primarily cortical, acute infarcts in the
bilateral frontal lobes, parietal lobes, and occipital lobes, as
well as the left temporal lobe.
3. Multifocal mild narrowing in the right ACA and bilateral MCAs.

## 2021-08-03 IMAGING — CT CT HEAD W/O CM
4 series · 16 of 47 positions shown, 18 images · non-contrast
Comparison: Yesterday

CLINICAL DATA: Stroke follow-up



[Series 2: head wo · axial · 0.41mm/px · z∈[-77,+33]mm · 7 of 30 slices shown, 9 images]
[im 4/30  brain]
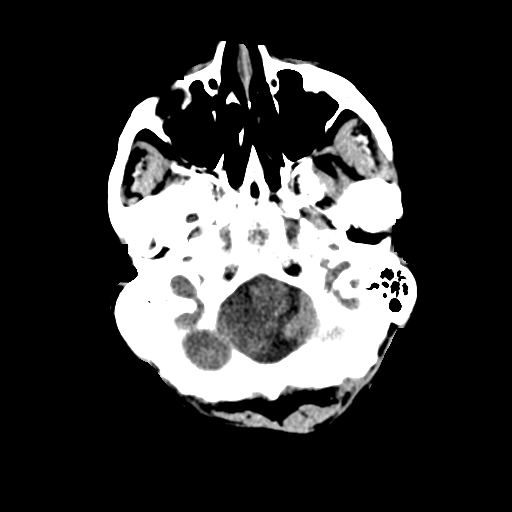
[im 4/30  bone]
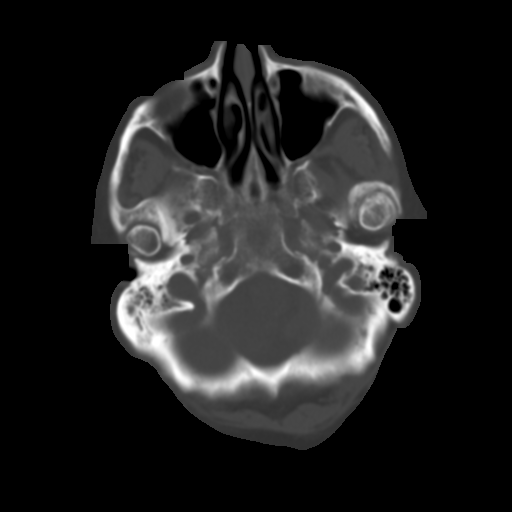
[im 8/30  brain]
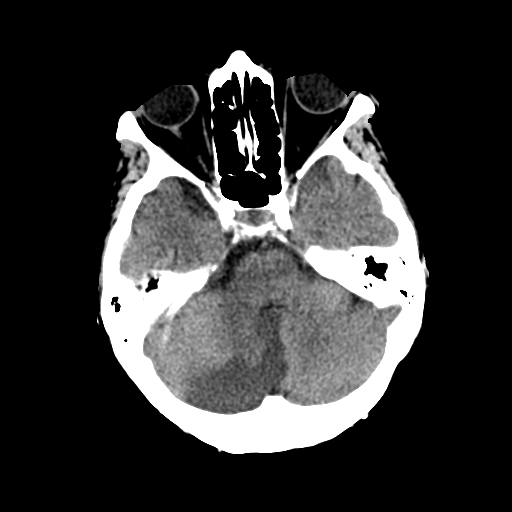
[im 11/30  brain]
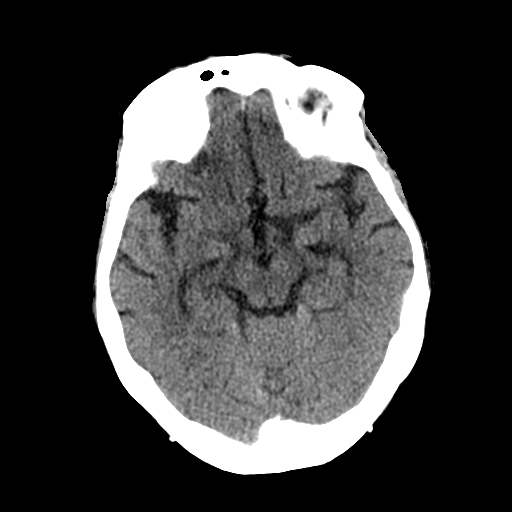
[im 15/30  brain]
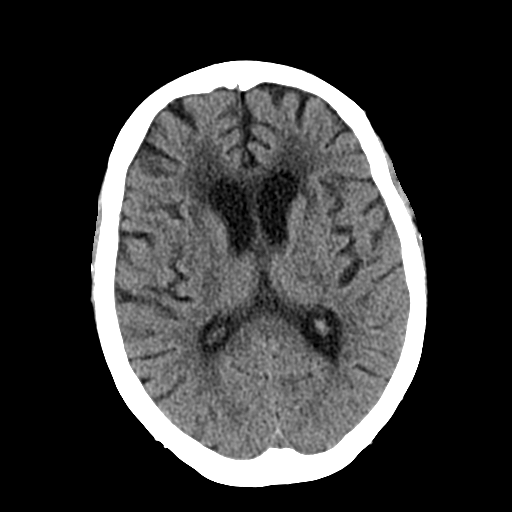
[im 19/30  brain]
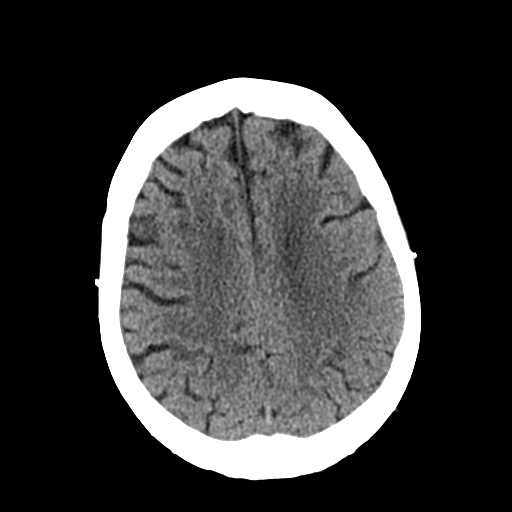
[im 19/30  bone]
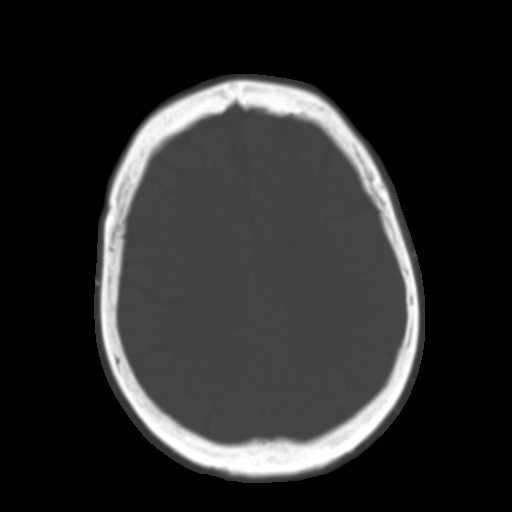
[im 22/30  brain]
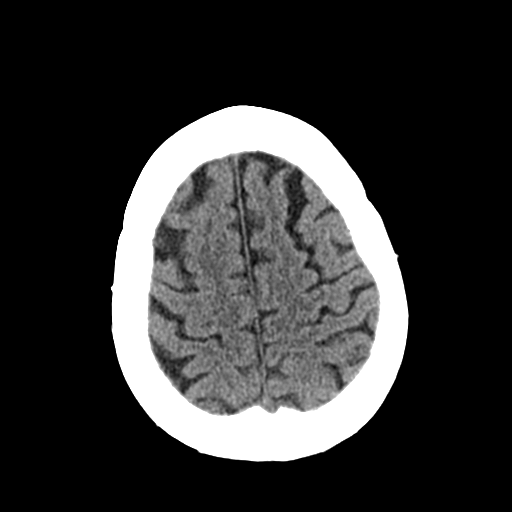
[im 26/30  brain]
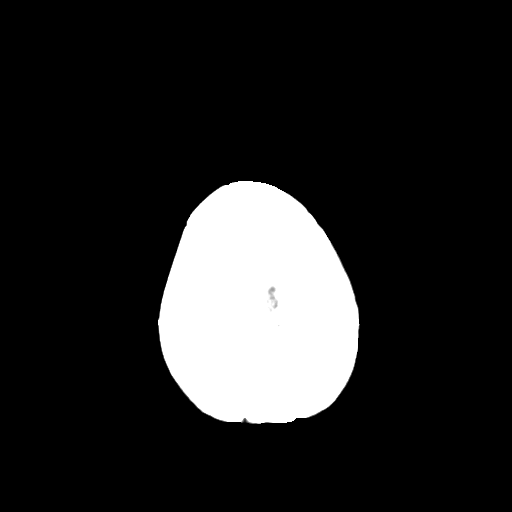

[Series 3: head bone · axial · 0.41mm/px · z∈[-78,-50]mm · 3 of 74 slices shown]
[im 8/74  bone]
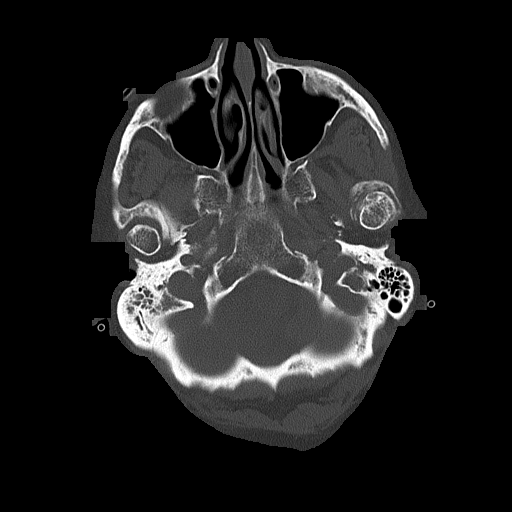
[im 15/74  bone]
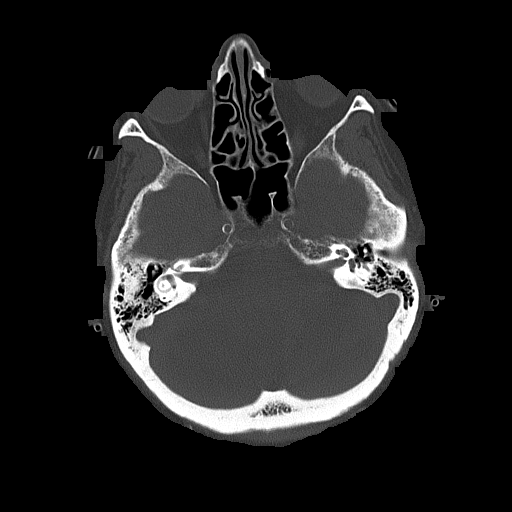
[im 22/74  bone]
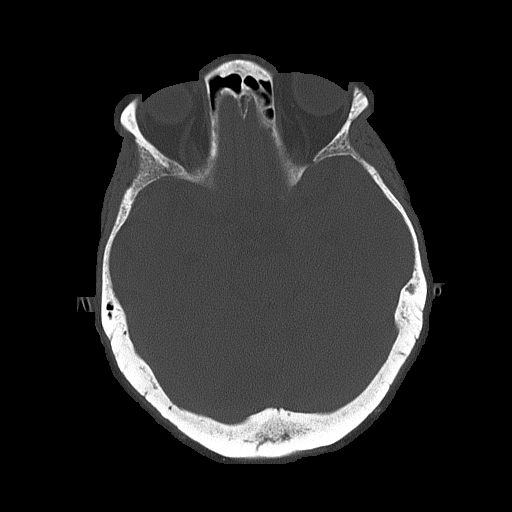

[Series 4: coronal soft tissue · coronal · 0.29mm/px · 3 of 66 slices shown]
[im 22/66  brain]
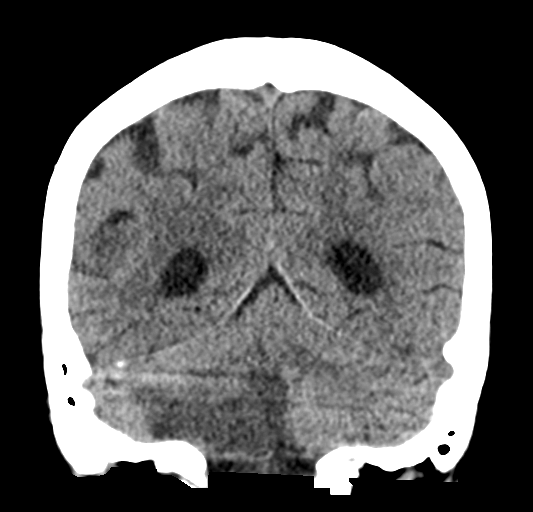
[im 29/66  brain]
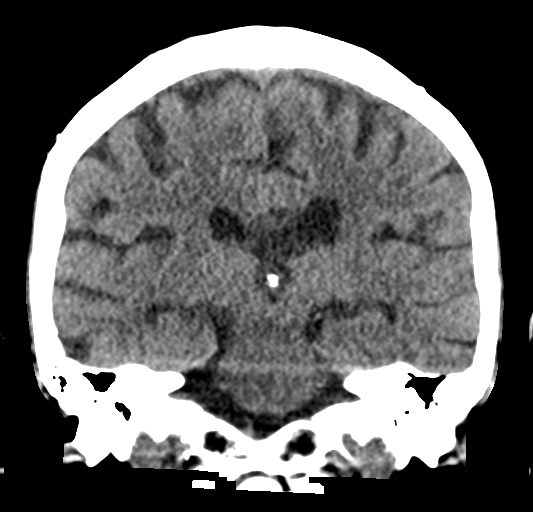
[im 37/66  brain]
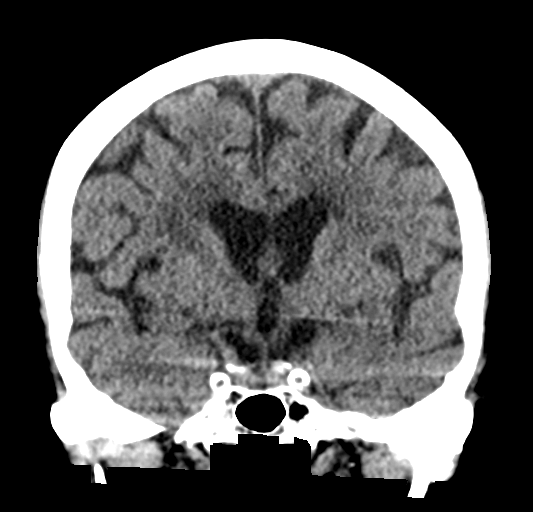

[Series 5: sagittal soft tissue · sagittal · 0.29mm/px · 3 of 51 slices shown]
[im 17/51  brain]
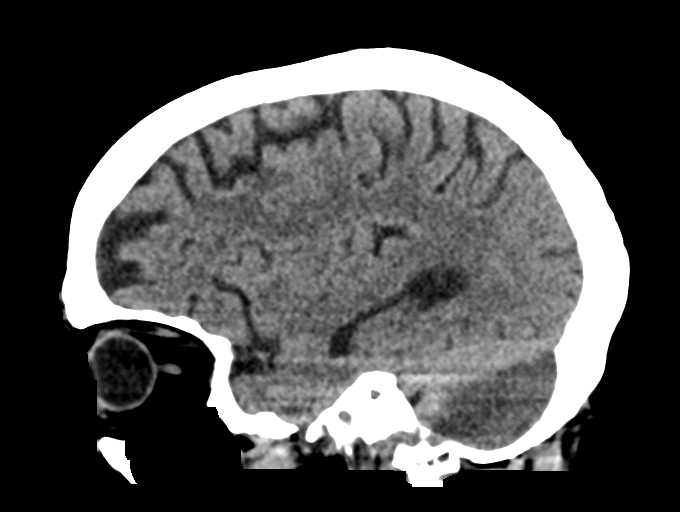
[im 26/51  brain]
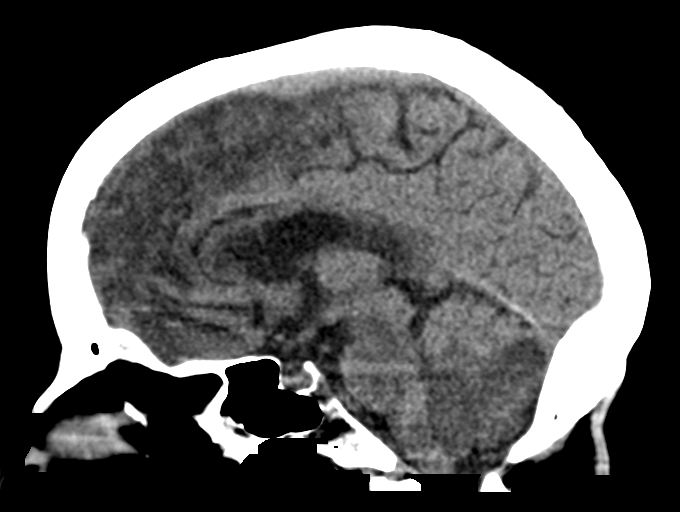
[im 34/51  brain]
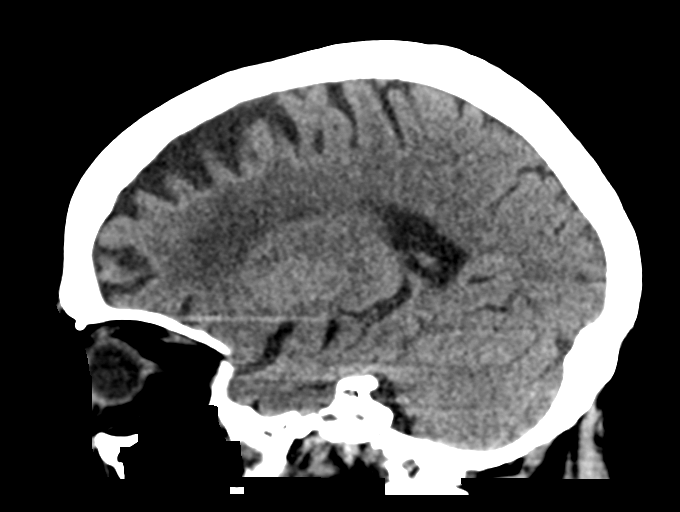

[16 of 47 positions shown; findings below may reference images not displayed]

FINDINGS: Brain: Infarct in the inferior right cerebellum with narrowing of
the fourth ventricle but no hydrocephalus or change in ventricular
volume. Underestimated cerebral ischemia compared to MRI. Chronic
small vessel ischemic gliosis in the hemispheric white matter.
Chronic lacunar infarcts at the deep gray nuclei. Negative for
hemorrhage.

Vascular: No hyperdense vessel or unexpected calcification.

Skull: Normal. Negative for fracture or focal lesion.

Sinuses/Orbits: No acute finding.
IMPRESSION: No progression or hemorrhage at the right cerebellar infarct. Fourth
ventricular narrowing without hydrocephalus or change in ventricular
volume.

## 2021-08-03 MED ORDER — HYDRALAZINE HCL 20 MG/ML IJ SOLN
5.0000 mg | INTRAMUSCULAR | Status: DC | PRN
Start: 2021-08-03 — End: 2021-08-04

## 2021-08-03 MED ORDER — SODIUM CHLORIDE 0.9 % IV SOLN
1.0000 g | INTRAVENOUS | Status: DC
  Administered 2021-08-03 – 2021-08-04 (×2): 1 g via INTRAVENOUS
  Filled 2021-08-03: qty 1
  Filled 2021-08-03: qty 20

## 2021-08-03 MED ORDER — HEPARIN SODIUM (PORCINE) 5000 UNIT/ML IJ SOLN
5000.0000 [IU] | Freq: Three times a day (TID) | INTRAMUSCULAR | Status: DC
  Administered 2021-08-03 – 2021-08-04 (×3): 5000 [IU] via SUBCUTANEOUS
  Filled 2021-08-03 (×3): qty 1

## 2021-08-03 MED ORDER — SODIUM CHLORIDE 0.9 % IV SOLN
250.0000 mg | INTRAVENOUS | Status: DC
  Administered 2021-08-03 – 2021-08-04 (×2): 250 mg via INTRAVENOUS
  Filled 2021-08-03 (×2): qty 2.5

## 2021-08-03 MED ORDER — HEPARIN SODIUM (PORCINE) 5000 UNIT/ML IJ SOLN: 5000.0000 [IU] | Freq: Three times a day (TID) | INTRAMUSCULAR | Status: DC

## 2021-08-03 MED ORDER — HEPARIN 1000 UNIT/ML FOR PERITONEAL DIALYSIS
INTRAMUSCULAR | Status: DC | PRN
  Filled 2021-08-03: qty 3000

## 2021-08-03 MED ORDER — STROKE: EARLY STAGES OF RECOVERY BOOK: Freq: Once | Status: DC

## 2021-08-03 NOTE — Progress Notes (Signed)
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 No hemorrhage. Mild mass effect on fourth ventricle but no significant interval ventricular enlargement since CT performed yesterday. Atrophy and chronic small vessel ischemic changes of the white Matter CODE STROKE called, discussed with Tele-neurologist Dr. Ramon Dredge bedside. Due to renal function f/u MRI/MRA wo contrast ordered instead of CT head/neck with perfusion.  Neurological Exam RASS: -4, GCS: 6. Withdraws from pain initially in [place], now only in [place] with deep sternal rub. RUE 2/3. No mvmt in BLE to painful stimuli. Left deviated gaze, downward at times.  NIHSS: 32 Level of  Consciousness: 0 = Alert, keenly responsive; 1 = Not alert, but arousable by minor stimulation to obey, answer, or respond; 2 = Not alert, requires repeated stimulation to attend, or is obtunded and requires strong or painful stimulation to make movements (not stereotyped); 3 = Responds only with reflex motor or autonomic effects or totally unresponsive, flaccid, and areflexic   2  LOC Questions 0 = Answers both questions correctly; 1 = Answers one question correctly; 2 = Answers neither question correctly.  2  LOC Commands 0 = Performs both tasks correctly; 1 = Performs one task correctly; 2 = Performs neither task correctly  2  Best Gaze  0 = Normal; 1 = Partial gaze palsy; gaze is abnormal in one or both eyes, but forced deviation or total gaze paresis is not present; 2 = Forced deviation, or total gaze paresis not overcome by the oculocephalic maneuver   2  Visual 0 = No visual loss; 1 = Partial hemianopia; 2 = Complete hemianopia; 3 = Bilateral hemianopia (blind including cortical blindness).   3  Facial Palsy 0 = Normal symmetrical movements; 1 = Minor  paralysis (flattened nasolabial fold, asymmetry on smiling); 2 = Partial paralysis (total or near-total paralysis of lower face); 3 = Complete paralysis of one or both sides (absence of facial movement in the upper and lower face).  0  Motor Arm LEFT 0 = No drift; limb holds 90 (or 45) degrees for full 10 secs; 1 = Drift; limb holds 90 (or 45) degrees, but drifts down before full 10 seconds; does not hit bed or other support; 2 = Some effort against gravity; limb cannot get to or maintain (if cued) 90 (or 45) degrees, drifts down to bed, but has some effort against gravity; 3 = No effort against gravity; limb falls; 4 = No movement; UN = unable to test (Amputation or joint fusion).   3  Motor Arm RIGHT 0 = No drift; limb holds 90 (or 45) degrees for full 10 secs; 1 = Drift; limb holds 90 (or 45) degrees, but drifts down before full 10 seconds; does not hit bed or other support; 2 = Some effort against gravity; limb cannot get to or maintain (if cued) 90 (or 45) degrees, drifts down to bed, but has some effort against gravity; 3 = No effort against gravity; limb falls; 4 = No movement; UN = unable to test (Amputation or joint fusion).   3  Motor Leg LEFT 0 = No drift; leg holds 30 degree position for full 5 secs; 1 = Drift; leg falls by the end of the 5-sec period but does not hit bed; 2 = Some effort against gravity; leg falls to bed by  5 secs, but has some effort against gravity; 3 = No effort against gravity; leg falls to bed immediately; 4 = No movement; UN = unable to test (Amputation or joint fusion).   4  Motor Leg RIGHT 0 = No drift; leg holds 30 degree position for full 5 secs; 1 = Drift; leg falls by the end of the 5-sec period but does not hit bed; 2 = Some effort against gravity; leg falls to bed by 5 secs, but has some effort against gravity; 3 = No effort against gravity; leg falls to bed immediately; 4 = No movement; UN = unable to test (Amputation or joint fusion).   4  Limb Ataxia 0 = Absent; 1  = Present in one limb; 2 = Present in two limbs; UN = Amputation or joint fusion   0  Sensory  0 = Normal, no sensory loss; 1 = Mild-to-moderate sensory loss, patient feels pinprick is less sharp or is dull on the affected side, or there is a loss of superficial pain with pinprick, but patient is aware of being touched; 2 = Severe to total sensory loss, patient is not aware of being touched in the face, arm, and leg.   3  Best Language 0 = No aphasia; normal; 1 = Mild-to-moderate aphasia; some obvious loss of fluency or facility of comprehension, w/o significant limitation on ideas expressed or form of expression. Reduction of speech and/or comprehension, however, makes conversation about provided materials difficult or impossible. For example, in conversation about provided materials, examiner can identify picture or naming card content from patient's response; 2 = Severe aphasia; all communication is through fragmentary expression; great need for inference, questioning, and guessing by the listener. Range of information that can be exchanged is limited; listener carries burden of communication. Examiner cannot identify materials provided from patient response; 3 = Mute, global aphasia; no usable speech or auditory comprehension  2  Dysarthria 0 = Normal; 1 = Mild-to-moderate dysarthria, patient slurs at least some words and, at worst, can be understood with some difficulty; 2 = Severe dysarthria, patient's speech is so slurred as to be unintelligible in the absence of or out of proportion to any dysphasia, or is mute/anarthric; UN = Intubated or other physical barrier  2  Extinction/Inattention 0 = No abnormality 1 = Visual, tactile, auditory, spatial, or personal extinction/inattention to bilateral simultaneous stimulation in one of the sensory modalities. 2 = Profound hemi-inattention or extinction to more than one modality; does not recognize own hand or orients to only one side of space.  0  Total   32    Addendum: MRI/MRA showing Moderate to large area of acute infarct in the inferior right cerebellum, in the right PICA territory. On the MRA, the right vertebral artery demonstrates particularly poor signal proximally in the right PICA is not visualized. Additional scattered, primarily cortical, acute infarcts in the bilateral frontal lobes, parietal lobes, and occipital lobes, as well as the left temporal lobe. Multifocal mild narrowing in the right ACA and bilateral MCAs. Discussed with tele-neurologist, patient not a candidate for intervention. Recommendations below: - supportive care - Repeat CT 6 am- looking for hydrocephalus/herniation - keppra 250 mg daily - f/u EEG - Neuro checks Q 2 x 24 hours - NIHSS Q shift - consider SLP/PT/OT consult if patient becomes more responsive - Neurology consulted, appreciate input - MRI/MRA  ordered - f/u  Hgb A1c  - heparin drip stopped -SCD's for VTE prophylaxis  Type 2 Diabetes Mellitus DKA -  resolved - HgbA1c pending, most recent 6.6(05/2021) goal < 7.0, f/u in AM - Q 4 CBG monitoring - SSI sensitive scale, 5 units Levemir BID - follow ICU hypo/hyper-glycemia protocol  Hypertension Permissive hypertension (OK if <220/120) for 24-48 hours post stroke and then gradually normalized within 5-7 days.  Hyperlipidemia Cholesterol: 270, LDL 164, triglycerides 355, VLDL: 71, HDL: 35, goal < 70 - f/u hepatic function in AM - consider starting high dose statin therapy as tolerated  Additional CC time 30 minutes  Venetia Night, AGACNP-BC Acute Care Nurse Practitioner [facility]   [phone] / [phone] Please see Amion for pager details.

## 2021-08-03 NOTE — Progress Notes (Signed)
Patient was seen by teleneurology overnight as stroke code for large ischemic infarct R cerebellum and scattered acute ischemic infarcts in bilateral cerebral hemispheres. Given her hx Takotsubo's suspect LV thrombus as the etiology although this has not been confirmed by TTE. Her cerebellar infarct is too large to enable safe anticoagulation. I had an extensive conversation with family at bedside. I explained that there is nothing that can be done to repair the damage from the stroke and that she is at risk for further cerebral edema. I also explained that we are unable to safely anticoagulate at this point 2/2 the size of the cerebellar infarct. Family felt that given her Takotsubo's CM, sepsis, and multifocal strokes that she would not want aggressive measures in these circumstances and have elected to take her home with hospice and pursue comfort care there only. Neurology is in support of this. We are available prn for questions going forward.  Su Monks, MD [facility] [phone]  If 7pm- 7am, please page neurology on call as listed in [place].

## 2021-08-03 NOTE — TOC Progression Note (Addendum)
Transition of Care [facility]) - Progression Note    Patient Details  Name: Misty [name] MRN: [id number] Date of Birth: 28-Feb-1941  Transition of Care [facility]) CM/SW [place], [place] Phone Number: 08/03/2021, 3:14 PM  Clinical Narrative:     Provider requested [facility] arrange transportation for patient tomorrow [facility] to home address). CSW ordered EMS transport for tomorrow at [place] confirmed receiving information.       Expected Discharge Plan and Services                                                 Social Determinants of Health (SDOH) Interventions    Readmission Risk Interventions    06/06/2021   11:55 AM  Readmission Risk Prevention Plan  Transportation Screening Complete  HRI or Home Care Consult Complete  Palliative Care Screening Not Applicable  Medication Review (RN Care Manager) Complete

## 2021-08-03 NOTE — Progress Notes (Signed)
Code stroke activated at 2341. Inpt provider Rust-Chester, NP at bedside and stated Head CT report shows new acute infarct. Pt with decreased LOC-stated sedation has been off since 1700.  Dr Ramon Dredge on screen at 2353

## 2021-08-03 NOTE — IPAL (Signed)
  Interdisciplinary Goals of Care Family Meeting   Date carried out: 08/03/2021  Location of the meeting: Phone conference  Member's involved: Nurse Practitioner and Family Member or next of kin  Durable Power of Attorney or acting medical decision maker: Misty [name], spouse  Discussion: We discussed goals of care for Misty [name] .  Discussed current status including unresponsive state and new moderate to large Right Cerebellum infarct as well as multiple smaller strokes throughout the brain. Patient is not a candidate for TnKase or thrombectomy. This decline in addition to severe sepsis secondary to acute cholangitis & mild pancreatitis, as well as Takotsubo's cardiomyopathy and concern for N-STEMI makes overall prognosis grave. Recommended DNR/DNI and husband agreed to CODE STATUS change. We discussed treating the treatable, keeping in mind her quality and comfort. He also expressed the desire to discuss options with palliative care in the morning regarding hospice and next steps if her clinical status continues to decline.  Daughter, Misty [name], updated in separate conversation regarding acute CVA.  Code status: Full DNR  Disposition: Continue current acute care  Time spent for the meeting: 20 minutes    Renato Battles, NP  08/03/2021, 2:13 AM  Domingo Pulse Rust-Chester, AGACNP-BC Acute Care Nurse Practitioner [facility]   [phone] / [phone] Please see Amion for pager details.

## 2021-08-03 NOTE — Consult Note (Signed)
[facility] TeleNeurology Consult Services   Patient Name:   Misty [name], Misty [name] Date of Birth:   1940/07/10 Identification Number:   MRN - [id number] Date of Service:   08/02/2021 23:45:30  Diagnosis:       I63.9 - Cerebrovascular accident (CVA), unspecified mechanism ([employer])  Impression:      81 year old female with altered mental status and new cerebellar stroke. Presentation concerning for possible brainstem involvement vs other worsening metabolic encephalopathy. Severe altered mental status likely secondary to stroke in bilateral cortical hemispheres as well as on going metabolic/infectious issues as well as possible Cefepime toxicity.   Our recommendations are outlined below.  Recommendations:        Stroke/Telemetry Floor       Neuro Checks       Bedside Swallow Eval       DVT Prophylaxis       IV Fluids, Normal Saline       Head of Bed 30 Degrees       Euglycemia and Avoid Hyperthermia (PRN Acetaminophen)       Recommend STAT MRI/MRA to rule out basilar occlusion and/or brainstem infarct.  Per facility request will defer further work up, management, and referrals to inpatient service, inclusive of inpatient neurology consult.  Sign Out:       Discussed with Rapid Response Team    ------------------------------------------------------------------------------  Advanced Imaging: Advanced Imaging Deferred because:  Due to ESRD and non-functioning PD catheter we will get STAT MRI and MRA  MRI shows bilateral embolic appearing infarcts   Metrics: Last Known Well: Unknown [facility] Notification Time: 08/02/2021 23:45:30 Stamp Time: 08/02/2021 23:45:30 Initial Response Time: 08/02/2021 23:52:45 Symptoms: Altered mental status. Initial patient interaction: 08/02/2021 23:53:00 NIHSS Assessment Completed: 08/03/2021 00:00:24 Patient is not a candidate for Thrombolytic. Thrombolytic Medical Decision: 08/03/2021 00:00:28 Patient was not deemed  candidate for Thrombolytic because of following reasons: Last Well Known Above 4.5 Hours.  I personally Reviewed the CT Head and it Showed subacute right cerebellar stroke  Primary Provider Notified of Diagnostic Impression and Management Plan on: 08/03/2021 00:16:13 Spoke With: Toribio Harbour Rust-Chester NP Able to Reach 08/03/2021 00:16:13    ------------------------------------------------------------------------------  History of Present Illness: Patient is a 81 year old Female.  Inpatient stroke alert was called for symptoms of Altered mental status. 81 year old female admitted to the hospital with sepsis secondary to cholecystitis who has a history of diastolic CHF, ESRD on PD (not working past 2 days), DM II, and HLD who presented to the hospital initially wtih altered mental status x 2 days. Initial CT did not show any acute abnormalities and then yesterday she was noted to be agitated and was put on precedex and was still interactive. Today patient became less interactive and precedex was stopped but patient remained obtunded and was noted to have a left gaze deviation. Repeat CT brain was done which showed a moderate sized subacute cerebellar stroke.   Past Medical History:      Hypertension      Hyperlipidemia      Stroke  Medications:  No Anticoagulant use  No Antiplatelet use Reviewed EMR for current medications  Allergies:  Reviewed  Social History: Unable To Obtain Due To Patient Status : Patient Is Obtunded/ Comatose  Family History:  Family History Cannot Be Obtained Because:Patient Is Obtunded/ Comatose There is no family history of premature cerebrovascular disease pertinent to this consultation  ROS : 14 Points Review of Systems was performed and was negative except mentioned in HPI.  ROS Cannot Be Obtained Because:  Patient Is Obtunded/ Comatose  Past Surgical History: Past Surgical History Cannot Be Obtained Because: Patient Is Obtunded/ Comatose There  Is No Surgical History Contributory To Today's Visit  NIHSS may not be reliable due to: Patient is comatose  Examination: BP(114/80), Pulse(113), 1A: Level of Consciousness - Postures or Unresponsive + 3 1B: Ask Month and Age - Could Not Answer Either Question Correctly + 2 1C: Blink Eyes & Squeeze Hands - Performs 0 Tasks + 2 2: Test Horizontal Extraocular Movements - Forced Gaze Palsy: Cannot Be Overcome + 2 3: Test Visual Fields - Bilateral Hemianopia + 3 4: Test Facial Palsy (Use Grimace if Obtunded) - Normal symmetry + 0 5A: Test Left Arm Motor Drift - No Effort Against Gravity + 3 5B: Test Right Arm Motor Drift - No Effort Against Gravity + 3 6A: Test Left Leg Motor Drift - No Movement + 4 6B: Test Right Leg Motor Drift - No Movement + 4 7: Test Limb Ataxia (FNF/Heel-Shin) - No Ataxia + 0 8: Test Sensation - Normal; No sensory loss + 0 9: Test Language/Aphasia - Mute/Global Aphasia: No Usable Speech/Auditory Comprehension + 3 10: Test Dysarthria - Mute/Anarthric + 2 11: Test Extinction/Inattention - No abnormality + 0  NIHSS Score: 31   Pre-Morbid Modified Rankin Scale: 4 Points = Moderately severe disability; unable to walk and attend to bodily needs without assistance   Patient/Family was informed the Neurology Consult would occur via TeleHealth consult by way of interactive audio and video telecommunications and consented to receiving care in this manner.   Patient is being evaluated for possible acute neurologic impairment and high probability of imminent or life-threatening deterioration. I spent total of 30 minutes providing care to this patient, including time for face to face visit via telemedicine, review of medical records, imaging studies and discussion of findings with providers, the patient and/or family.   Dr Tsosie Billing   [facility] [phone]   Case [id number]

## 2021-08-03 NOTE — TOC Progression Note (Signed)
Transition of Care [facility]) - Initial/Assessment Note    Patient Details  Name: Misty [name] MRN: [id number] Date of Birth: [birth year]  Transition of Care [facility]) CM/SW Contact:    Janyth Contes, [place] Phone Number: 08/03/2021, 1:12 PM  Clinical Narrative:                  CSW connected family to hospice services. Hospice staff reported the family decided to return home with hospice services. Hospice staff reported they are sending needed information to their provider for review.   No further TOC needs at this time.        Patient Goals and CMS Choice        Expected Discharge Plan and Services                                                Prior Living Arrangements/Services                       Activities of Daily Living      Permission Sought/Granted                  Emotional Assessment              Admission diagnosis:  Dehydration [E86.0] ESRD on peritoneal dialysis ([place]) [N18.6, Z99.2] Abdominal pain [R10.9] Altered mental status, unspecified altered mental status type [R41.82] Sepsis due to pneumonia ([place]) [J18.9, A41.9] Acute pancreatitis, unspecified complication status, unspecified pancreatitis type [K85.90] AMS (altered mental status) [R41.82] Patient Active Problem List   Diagnosis Date Noted   Acute CVA (cerebrovascular accident) ([place])    Metabolic acidosis 40/98/1191   Respiratory distress 08/02/2021   Acute pancreatitis    Abdominal pain 08/01/2021   AMS (altered mental status) 08/01/2021   Severe sepsis ([place]) 08/01/2021   Acute cholecystitis 08/01/2021   Pressure injury of skin 06/06/2021   Acute blood loss anemia 06/04/2021   Acute lower GI bleeding 06/04/2021   ESRD on peritoneal dialysis ([place]) 06/04/2021   Chronic diastolic CHF (congestive heart failure) ([place]) 06/04/2021   Cancer ([place]) 02/19/2021   Memory changes 09/24/2020   ESRD (end stage renal disease) ([place]) 47/82/9562   Acute diastolic CHF  (congestive heart failure) ([place]) 06/21/2020   CKD stage 5 due to type 2 diabetes mellitus ([place]) 06/21/2020   Acute respiratory failure ([place]) 06/21/2020   Rectal bleeding 05/02/2020   Type II diabetes mellitus with renal manifestations ([place]) 05/02/2020   Hyperkalemia 05/02/2020   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   TIA (transient ischemic attack) 04/12/2020   Moderate aortic stenosis 04/12/2020   Penicillin allergy 04/12/2020   Mixed stress and urge urinary incontinence 02/17/2020   Foraminal stenosis of lumbar region 01/19/2020   Chronic right-sided low back pain with right-sided sciatica 01/16/2020   Hypertensive retinopathy of both eyes 10/13/2019   Moderate nonproliferative diabetic retinopathy of both eyes with macular edema associated with type 2 diabetes mellitus ([place]) 10/13/2019   At risk for delirium 10/07/2019   Anemia in stage 4 chronic kidney disease ([place]) 10/15/2018   Fracture of scapular body 10/15/2018   Chronic kidney disease, stage 4 (severe) ([place]) 08/31/2018   Disorder of mineral metabolism, unspecified 08/31/2018   MGUS (monoclonal gammopathy of unknown significance) 11/10/2017   Nondisplaced fracture of acromial process, unspecified shoulder, initial encounter for closed fracture 08/19/2017   Hyperuricemia  05/22/2017   Multiple myeloma ([place]) 05/19/2017   Pseudophakia of both eyes 01/11/2015   PCO (posterior capsular opacification) 01/11/2015   Paresthesia 01/19/2014   Wrist pain 01/04/2014   PVD (posterior vitreous detachment), both eyes 08/10/2012   Glaucoma suspect of both eyes 08/10/2012   Diabetes mellitus ([place]) 03/26/2010   Hypertension 04/03/2009   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

## 2021-08-03 NOTE — Progress Notes (Signed)
Chaplain responded to page to support family.Family were receptive and appreciative of  Chaplain's visit. Husband was tearful, yet accepting of his wife status. Husband also requested a visit from a Misty [name] being as though patient is [place]. Chaplain offered compassionate presence and contacted Misty [name], per husband's request for last rites. Misty [name] will be in shortly. Please contact Chaplain if needed.

## 2021-08-03 NOTE — Progress Notes (Addendum)
[facility]) Hospital Liaison Note   Received request from Transitions of Care Manager, Alease Frame, for hospice services at home after discharge. Chart and patient information under review by [facility] physician. Hospice eligibility eligible.   Spoke with husband/Richard & daughter/Donna to initiate education related to hospice philosophy, services, and team approach to care. Both verbalized understanding of information given. Per discussion, the plan is for patient to discharge home via [facility] once cleared to DC.    DME needs discussed. Patient has the following equipment in the home (Purchased privately): N/A Patient requests the following equipment for delivery: Hospital Bed Patient is currently on 2L of O2. MSW inquired of O2 needs and Richard [employer]ch [employer] refused reporting that they do not want O2 ordered as they feel as if this makes patient uncomfortable. MSW provided education of not having O2 and the risks of this. Family decided to accept O2 on 2L.  Address verified and is correct in the chart. Delfino Lovett is the family member to contact to arrange time of equipment delivery.    Please send signed and completed DNR home with patient/family. Please provide prescriptions at discharge as needed to ensure ongoing symptom management.    [facility] information and contact numbers given to family & above information shared with TOC.   Please call with any questions/concerns.    Thank you for the opportunity to participate in this patient's care.   Daphene Calamity, MSW [facility] Liaison  [phone]

## 2021-08-03 NOTE — Progress Notes (Signed)
BRIEF PCCM NOTE  Pt was seen earlier by [facility] provider Domingo Pulse Rust-Chester, APP.  Please see her H&P with attestation by Dr. Merrilee Jansky for full assessment & plan.  BRIEF PT DESCRIPTION / SYNOPSIS :  81 y.o. Female admitted Severe Sepsis in the setting of suspected acute cholecystitis & pancreatitis, Acute Hypoxic Respiratory Failure due to Acute Decompensated HFpEF, severe Hyperglycemia (DKA vs. HHS), and Acute Metabolic Encephalopathy.  General Surgery is following.  SUBJECTIVE / INTERVAL HISTORY :  -Plan for percutaneous drain by IR yesterday -Neuroimaging for AMS with cerebellar CVA, see below   OBJECTIVE :   Today's Vitals   08/03/21 0400 08/03/21 0500 08/03/21 0515 08/03/21 0600  BP: (!) 115/46 (!) 121/44 (!) 112/54 (!) 120/51  Pulse: (!) 103 (!) 105 (!) 105 (!) 106  Resp: (!) '24 14 20 '$ (!) 21  Temp: 98.2 F (36.8 C)     TempSrc: Axillary     SpO2: 99% 100% 100% 100%  Weight:       Body mass index is 18.94 kg/m.   ASSESSMENT / PLAN :   With the overall prognosis poor and opposing medical management goals for her multiple problems, neurology and palliative had long conversation with family who wish that she be able to return home with hospice.   Acute Hypoxic Respiratory Failure secondary to acute pulmonary edema & multiple metabolic derangements -Supplemental O2 as needed to maintain O2 sats >90% -BiPAP, prn or qHS -Follow intermittent Chest X-ray & ABG as needed -Bronchodilators as needed -Diuresis as BP and renal function permits ~ received 80 mg IV Lasix yesterday -Pulmonary toilet as able  Septic Shock Acute Decompensated HFpEF 5/26 BNP: >4,500, recheck -Continuous cardiac monitoring -Maintain MAP >65 -Vasopressors as needed to maintain MAP goal -Trend lactic acid until normalized -Trend HS Troponin until peaked -Echocardiogram EF 35-40%  Severe Sepsis due to suspected acute cholecystitis & pancreatitis Lactic: 4.8 - 5.1 - >9.0 Initial  interventions/workup included: 1.3 L of NS/LR & Cefepime/ Metronidazole -Monitor fever curve -Trend WBC's & Procalcitonin -Follow cultures as above -Continue empiric Cefepime & Flagyl pending cultures & sensitivities -General surgery following, appreciate input ~ plan for IR placement of percutaneous drain 5/26 - monitor hepatic fxn, lipase & amylase   Hyperglycemia secondary to mild Diabetic ketoacidosis vs HHS Worsening AGMA PMHx: T2DM - Hyperglycemia protocol initiated > will hold off on continuous IVF due to HFpEF exacerbation and tenuous respiratory status, reassess when CBG drops < 250 -Follow DKA/HHS protocol -Insulin drip -Follow BMP every 4 hours -Once anion gap closed and serum bicarb greater than 20, can convert to long-acting insulin and sliding scale insulin (diabetes coronary recommends 5 units Levemir twice daily and sensitive sliding scale) - Diabetes coordinator consult   ESRD on PD -Monitor I&O's / urinary output -Follow BMP -Ensure adequate renal perfusion -Avoid nephrotoxic agents as able -Replace electrolytes as indicated -Nephrology following, appreciate input ~ PD as per Nephrology   NEURO: Acute Encephalopathy suspect acute delirium multifocal in the setting of severe sepsis, acidosis and severe hyperglycemia Acute Stroke:  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 No hemorrhage. Mild mass effect on fourth ventricle but no significant interval ventricular enlargement since CT performed yesterday. Atrophy and chronic small vessel ischemic changes of the white Matter CODE STROKE called last night, discussed with Tele-neurologist Dr. Ramon Dredge  bedside.  Due to renal function f/u MRI/MRA wo contrast ordered instead of CT head/neck with perfusion.   Neurological  Exam 5/26 RASS: -4, GCS: 6. Withdraws from pain initially in [place], now only in [place] with deep sternal rub. RUE 2/3. No mvmt in BLE to painful stimuli. Left deviated gaze, downward at times.  NIHSS: 32  Addendum: MRI/MRA showing Moderate to large area of acute infarct in the inferior right cerebellum, in the right PICA territory. On the MRA, the right vertebral artery demonstrates particularly poor signal proximally in the right PICA is not visualized. Additional scattered, primarily cortical, acute infarcts in the bilateral frontal lobes, parietal lobes, and occipital lobes, as well as the left temporal lobe. Multifocal mild narrowing in the right ACA and bilateral MCAs. Discussed with tele-neurologist, patient not a candidate for intervention. Recommendations below: - supportive care - Repeat CT 6 am- looking for hydrocephalus/herniation - keppra 250 mg daily - f/u EEG Patient is critically ill, high risk for deterioration, need for mechanical ventilation, cardiac arrest and death.  Recommend DNR/DNI status.  Palliative care consulted to assist with goals of care.  Updated patient's husband at bedside 5/26..  With the overall prognosis poor and opposing medical management goals for her multiple problems, neurology and palliative had long conversation with family who wish that she be able to return home with hospice.

## 2021-08-03 NOTE — Consult Note (Signed)
[facility] Cardiology  CARDIOLOGY CONSULT NOTE  Patient ID: Misty [name] MRN: [id number] DOB/AGE: 1940-11-07 81 y.o.  Admit date: 08/01/2021 Referring Physician Merrilee Jansky Primary Physician Gayland Curry, MD Primary Cardiologist Nehemiah Massed Reason for Consultation Abnormal echo  HPI:  Misty [name] is a 81 year old female with history of mild aortic stenosis, HFpEF, ESRD on PD, hypertension, type 2 diabetes who was admitted to the hospital with cholangitis.  Cardiology is consulted for abnormal echo, elevated troponin.  Interval history - Patient remains obtunded. Responds to pain.  - Overnight had brain MRI showed acute CVA in multiple territories.  - Husband at bedside this morning.   Review of systems complete and found to be negative unless listed above     Past Medical History:  Diagnosis Date   Anemia of chronic renal failure    Aortic valve stenosis 07/02/2017   a.) TTE 07/02/2017: 55-60%; mild AS with MPG 13.0 mmHg. b.) TTE 10/14/2019: EF >55%; MPG 23.0 mmHg. c.) TTE 06/23/2020: EF 50-55%; MPG 19.7 mmHg. d.) TTE 11/27/2020: EF >55%; MPG 18.0 mmHg.   Chronic lower back pain    Complication of anesthesia    a.) severe myalgias with succinylcholine use. b.) (+) difficult airway.   Diastolic dysfunction 73/53/2992   a.) TTE 07/02/2017; EF 55-60%; mild AR; G1DD. b.) TTE 06/23/2020: EF 50-55% with mild LVH; mild to mod LA enlargement; mild AR, mild to mod MR; G2DD.   Difficult airway for intubation    a.) 08/08/2020 at [facility] --> Initial attempt with Sabra Heck 3, larynx was "rigid/fixed"; unable to visualize anything but epiglottis. Glidescope utilized to obtain a great view with VL. First attempt with Glidescope unsuccessful d/t anterior orientation. ETT withdrawn and a greater bend was placed. Second attempt was successful with effort. Patient sustained lip laceration during intubation attempts.   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   a.) TTE 11/03/2019: measured 3.7 cm    ESRD on peritoneal dialysis ([place])    GERD (gastroesophageal reflux disease)    Glaucoma    Heart murmur    HLD (hyperlipidemia)    Hyperparathyroidism ([place])    a.) s/p total parathyroidectomy   Hypertension    Hypertensive retinopathy    Lumbar radiculopathy    MGUS (monoclonal gammopathy of unknown significance)    Motion sickness    Nonproliferative diabetic retinopathy ([place])    Osteoarthritis    Peritoneal dialysis catheter in place [facility])    Personal history of chemotherapy    Personal history of radiation therapy    Rectal cancer ([place]) 2011   a.) Clinical stage T3N0. b.) s/p 3 cycles of neoadjuvant CI 5FU + XRT c.) s/p resection on 08/08/2009.   Spinal stenosis of lumbar region with neurogenic claudication    T2DM (type 2 diabetes mellitus) ([place])    TIA (transient ischemic attack)     Past Surgical History:  Procedure Laterality Date   BACK SURGERY     BREAST BIOPSY Left    bx/ clip-neg   COLON SURGERY     COLONOSCOPY N/A 05/03/2020   Procedure: COLONOSCOPY;  Surgeon: Lesly Rubenstein, MD;  Location: [facility] ENDOSCOPY;  Service: Endoscopy;  Laterality: N/A;   COLONOSCOPY WITH PROPOFOL N/A 07/02/2021   Procedure: COLONOSCOPY WITH PROPOFOL;  Surgeon: [name], MD;  Location: [facility] ENDOSCOPY;  Service: Gastroenterology;  Laterality: N/A;   coloretcal cancer     DIALYSIS/PERMA CATHETER REMOVAL N/A 10/01/2020   Procedure: DIALYSIS/PERMA CATHETER REMOVAL;  Surgeon: Algernon Huxley, MD;  Location: [place] CV LAB;  Service: Cardiovascular;  Laterality: N/A;   EMBOLIZATION N/A 06/06/2021   Procedure: EMBOLIZATION;  Surgeon: Algernon Huxley, MD;  Location: [place] CV LAB;  Service: Cardiovascular;  Laterality: N/A;   IR PERC CHOLECYSTOSTOMY  08/02/2021   LUMBAR LAMINECTOMY/DECOMPRESSION MICRODISCECTOMY Right 01/23/2021   Procedure: RIGHT L5-S1 DECOMPRESSION;  Surgeon: Meade Maw, MD;  Location: [facility] ORS;  Service: Neurosurgery;  Laterality: Right;   thyroid gland      parathyroid   TUBAL LIGATION      Medications Prior to Admission  Medication Sig Dispense Refill Last Dose   cetirizine (ZYRTEC) 10 MG tablet Take 10 mg by mouth every morning.   08/01/2021   Cholecalciferol (VITAMIN D) 50 MCG (2000 UT) tablet Take 2,000 Units by mouth every Monday, Wednesday, and Friday.   Past Week   colestipol (COLESTID) 1 g tablet Take 2 g by mouth daily.   Past Week   Fiber Diet TABS Take 2 tablets by mouth 2 (two) times daily.   Past Week   gabapentin (NEURONTIN) 300 MG capsule Take 1 capsule (300 mg total) by mouth at bedtime. (Patient taking differently: Take 300 mg by mouth 2 (two) times daily.) 30 capsule 1 07/31/2021   LANTUS SOLOSTAR 100 UNIT/ML Solostar Pen Inject 10 Units into the skin at bedtime.   07/31/2021   multivitamin (RENA-VIT) TABS tablet Take 1 tablet by mouth every morning.   Past Week   sevelamer carbonate (RENVELA) 800 MG tablet Take 1,600 mg by mouth 2 (two) times daily with a meal.   Past Week   acetaminophen (TYLENOL) 325 MG tablet Take 650 mg by mouth every 4 (four) hours as needed for moderate pain.   prn   carvedilol (COREG) 12.5 MG tablet Take 12.5 mg by mouth 2 (two) times daily with a meal. (Patient not taking: Reported on 07/01/2021)      glucose blood test strip 1 each by Finger Stick route 4 times daily (after meals and nightly). Use as directed      HUMALOG 100 UNIT/ML injection Inject 4-12 Units into the skin 2 (two) times daily with a meal.   prn   hydrocortisone (ANUSOL-HC) 2.5 % rectal cream Apply 1 application. topically daily as needed. (Patient not taking: Reported on 07/01/2021)      Hydrocortisone Acetate 2.5 % CREA Apply topically.      INSULIN SYRINGE 1CC/29G 29G X 1/2" 1 ML MISC       lidocaine (LIDODERM) 5 % Place 1 patch onto the skin daily as needed (pain).   prn   methocarbamol (ROBAXIN) 500 MG tablet Take 1 tablet (500 mg total) by mouth every 8 (eight) hours as needed for muscle spasms. (Patient not taking: Reported on  07/01/2021) 90 tablet 0    ondansetron (ZOFRAN) 4 MG tablet Take 1 tablet (4 MG) before prep starts. Take 3 tablets (9 MG) as needed 6 hours after for nausea. (Patient not taking: Reported on 08/01/2021) 4 tablet 0 Completed Course   pantoprazole (PROTONIX) 40 MG tablet Take 1 tablet (40 mg total) by mouth daily. 30 tablet 0    SURE COMFORT PEN NEEDLES 31G X 8 MM MISC       Social History   Socioeconomic History   Marital status: Married    Spouse name: Not on file   Number of children: Not on file   Years of education: Not on file   Highest education level: Not on file  Occupational History   Not on file  Tobacco Use   Smoking status:  Never   Smokeless tobacco: Never  Vaping Use   Vaping Use: Never used  Substance and Sexual Activity   Alcohol use: Never   Drug use: Never   Sexual activity: Not on file  Other Topics Concern   Not on file  Social History Narrative   Not on file   Social Determinants of Health   Financial Resource Strain: Not on file  Food Insecurity: Not on file  Transportation Needs: Not on file  Physical Activity: Not on file  Stress: Not on file  Social Connections: Not on file  Intimate Partner Violence: Not on file    Family History  Problem Relation Age of Onset   Lung disease Sister    Diabetes Mellitus II Paternal Grandmother    Breast cancer Neg Hx       Review of systems complete and found to be negative unless listed above      PHYSICAL EXAM  General: Obtunded HEENT:  Normocephalic and atramatic Neck:  No JVD.  Lungs: Clear bilaterally to auscultation and percussion. Heart: HRRR. Distant heart sounds. 2/6 systolic murmur  Abdomen: Firm. Somehwhat decreased breath sounds.  Msk:  Back normal, normal gait. Normal strength and tone for age. Extremities: No clubbing, cyanosis or edema.   Neuro: Unable to assess Psych:  Unable to assess.   Labs:   Lab Results  Component Value Date   WBC 14.9 (H) 08/03/2021   HGB 9.2 (L)  08/03/2021   HCT 29.2 (L) 08/03/2021   MCV 97.0 08/03/2021   PLT 188 08/03/2021    Recent Labs  Lab 08/03/21 0229  NA 145  K 4.9  CL 109  CO2 23  BUN 45*  CREATININE 4.86*  CALCIUM 8.6*  PROT 5.0*  BILITOT 0.7  ALKPHOS 67  ALT 56*  AST 205*  GLUCOSE 202*    No results found for: CKTOTAL, CKMB, CKMBINDEX, TROPONINI  Lab Results  Component Value Date   CHOL 270 (H) 08/01/2021   Lab Results  Component Value Date   HDL 35 (L) 08/01/2021   Lab Results  Component Value Date   LDLCALC 164 (H) 08/01/2021   Lab Results  Component Value Date   TRIG 359 (H) 08/01/2021   TRIG 355 (H) 08/01/2021   Lab Results  Component Value Date   CHOLHDL 7.7 08/01/2021   No results found for: LDLDIRECT    Radiology: CT ABDOMEN PELVIS WO CONTRAST  Result Date: 08/01/2021 CLINICAL DATA:  Nausea and vomiting EXAM: CT ABDOMEN AND PELVIS WITHOUT CONTRAST TECHNIQUE: Multidetector CT imaging of the abdomen and pelvis was performed following the standard protocol without IV contrast. RADIATION DOSE REDUCTION: This exam was performed according to the departmental dose-optimization program which includes automated exposure control, adjustment of the mA and/or kV according to patient size and/or use of iterative reconstruction technique. COMPARISON:  CTA abdomen and pelvis 06/05/2021 FINDINGS: Lower chest: Small right and trace left pleural effusions. Hazy and irregular densities in the lower lungs likely represent subsegmental atelectasis and possibly infiltrates, greater on the right. Hepatobiliary: Liver is normal in size with no focal mass identified. Gallbladder is significantly distended and contains a 2.6 cm calculus. No significant gallbladder wall thickening or surrounding inflammatory changes. No biliary ductal dilatation appreciated. Pancreas: Mild peripancreatic fat stranding at the tail the pancreas. No pancreatic ductal dilatation. Spleen: Normal size with several tiny calcified granulomas.  Adrenals/Urinary Tract: Adrenal glands appear normal. Kidneys are atrophic with scattered likely vascular calcifications. No definite nephrolithiasis or hydronephrosis identified bilaterally. Urinary  bladder appears normal. Stomach/Bowel: Tiny hiatal hernia. No bowel obstruction, free air or pneumatosis. No bowel wall edema identified. No evidence of acute appendicitis. Vascular/Lymphatic: Severe atherosclerotic disease including large nearly occlusive appearing calcified plaque in the mid abdominal aorta. No bulky lymphadenopathy identified. Reproductive: Small uterus with calcifications. Other: Small volume ascites mostly in the upper abdomen. Stable loculated fluid collection in the right pelvis which appears chronic and likely postsurgical in nature. Stable chronic area of fat necrosis in the mid abdomen near the level of the umbilicus. A percutaneous catheter is again seen with the tip coiled in the pelvis. Musculoskeletal: Advanced degenerative changes of the spine. No suspicious bony lesions visualized. IMPRESSION: 1. Mild peripancreatic edema which could be secondary to mild acute pancreatitis or related to the small volume ascites. Correlate clinically. 2. Cholelithiasis and markedly distended gallbladder. No gallbladder wall thickening or surrounding inflammatory changes visualized. Correlate clinically and consider follow-up ultrasound if indicated. 3. Small right pleural effusion and trace left pleural effusion. Associated subsegmental atelectatic changes and possibly infiltrates in the lower lungs. 4. Severe atherosclerotic disease. 5. Other chronic findings as described. Electronically Signed   By: Ofilia Neas M.D.   On: 08/01/2021 19:57   CT HEAD WO CONTRAST (5MM)  Result Date: 08/03/2021 CLINICAL DATA:  Stroke follow-up EXAM: CT HEAD WITHOUT CONTRAST TECHNIQUE: Contiguous axial images were obtained from the base of the skull through the vertex without intravenous contrast. RADIATION DOSE  REDUCTION: This exam was performed according to the departmental dose-optimization program which includes automated exposure control, adjustment of the mA and/or kV according to patient size and/or use of iterative reconstruction technique. COMPARISON:  Yesterday FINDINGS: Brain: Infarct in the inferior right cerebellum with narrowing of the fourth ventricle but no hydrocephalus or change in ventricular volume. Underestimated cerebral ischemia compared to MRI. Chronic small vessel ischemic gliosis in the hemispheric white matter. Chronic lacunar infarcts at the deep gray nuclei. Negative for hemorrhage. Vascular: No hyperdense vessel or unexpected calcification. Skull: Normal. Negative for fracture or focal lesion. Sinuses/Orbits: No acute finding. IMPRESSION: No progression or hemorrhage at the right cerebellar infarct. Fourth ventricular narrowing without hydrocephalus or change in ventricular volume. Electronically Signed   By: Jorje Guild M.D.   On: 08/03/2021 06:28   CT HEAD WO CONTRAST (5MM)  Result Date: 08/02/2021 CLINICAL DATA:  Mental status change EXAM: CT HEAD WITHOUT CONTRAST TECHNIQUE: Contiguous axial images were obtained from the base of the skull through the vertex without intravenous contrast. RADIATION DOSE REDUCTION: This exam was performed according to the departmental dose-optimization program which includes automated exposure control, adjustment of the mA and/or kV according to patient size and/or use of iterative reconstruction technique. COMPARISON:  CT brain 08/01/2021 FINDINGS: Brain: Interval moderate hypodensity within the inferior right cerebellum and vermis, consistent with edema and acute infarct. No hemorrhage. Minimal mass effect on fourth ventricle. The ventricle size is stable. Atrophy and chronic small vessel ischemic changes of the white matter. Chronic lacunar infarcts in the basal ganglia and thalamus Vascular: No hyperdense vessels. Vertebral and carotid vascular  calcification Skull: Normal. Negative for fracture or focal lesion. Sinuses/Orbits: No acute finding. Other: None IMPRESSION: 1. Interval development of moderate area of hypodensity within the right cerebellum, consistent with an acute infarct. No hemorrhage. Mild mass effect on fourth ventricle but no significant interval ventricular enlargement since CT performed yesterday 2. Atrophy and chronic small vessel ischemic changes of the white matter These results will be called to the ordering clinician or representative by the Radiologist  Environmental consultant, and communication documented in the PACS or [employer]. Electronically Signed   By: Donavan Foil M.D.   On: 08/02/2021 23:33   CT Head Wo Contrast  Result Date: 08/01/2021 CLINICAL DATA:  Mental status change of unknown cause beginning 2 days ago. Vomiting. EXAM: CT HEAD WITHOUT CONTRAST TECHNIQUE: Contiguous axial images were obtained from the base of the skull through the vertex without intravenous contrast. RADIATION DOSE REDUCTION: This exam was performed according to the departmental dose-optimization program which includes automated exposure control, adjustment of the mA and/or kV according to patient size and/or use of iterative reconstruction technique. COMPARISON:  MRI 07/26/2021.  CT 06/04/2021 FINDINGS: Brain: No acute finding. Age related atrophy. Chronic small-vessel ischemic changes of the white matter. 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 No sign of acute infarction, mass lesion, hemorrhage, hydrocephalus or extra-axial collection. Vascular: There is atherosclerotic calcification of the major vessels at the base of the brain. Skull: Negative Sinuses/Orbits: Clear/normal Other: None IMPRESSION: No acute finding. Atrophy and chronic small-vessel ischemic changes as above, similar to previous exams. Electronically Signed   By: Nelson Chimes M.D.   On: 08/01/2021 18:50   MR ANGIO HEAD WO CONTRAST  Result Date:  08/03/2021 CLINICAL DATA:  Mental status change, stroke suspected EXAM: MRI HEAD WITHOUT CONTRAST MRA HEAD WITHOUT CONTRAST TECHNIQUE: Multiplanar, multi-echo pulse sequences of the brain and surrounding structures were acquired without intravenous contrast. Angiographic images of the Circle of Willis were acquired using MRA technique without intravenous contrast. COMPARISON:  None Available. FINDINGS: MRI HEAD FINDINGS Brain: Moderate to large area of restricted diffusion with ADC correlate in the inferior right cerebellum (series 5, image 10), in the right PICA territory, measuring approximately 4.0 x 5.7 x 2.8 cm (AP x TR x CC) (series 5, image 10 and series 7, image 11). This causes mild mass effect on the left cerebral hemisphere. The fourth ventricle is likely narrowed but remains patent. No hydrocephalus. Additional scattered small areas and punctate foci of restricted diffusion are seen in the bilateral frontal lobes, parietal lobes, and occipital lobes, as well as the left temporal lobe and possibly the left fornix. These areas are predominantly cortical. No acute hemorrhage, mass, or cerebral midline shift. No extra-axial collection. Vascular: Loss of the right vertebral artery flow void. Otherwise normal arterial flow voids. Skull and upper cervical spine: Normal marrow signal. Sinuses/Orbits: No acute finding. Status post bilateral lens replacements. Other: Fluid in right mastoid air cells. MRA HEAD FINDINGS Anterior circulation: Both internal carotid arteries are patent to the termini, without significant stenosis. A1 segments patent, with a hypoplastic or mildly stenotic left A1. Normal anterior communicating artery. Intermittent poor flow in the right A2 and A3 segments anterior cerebral arteries are patent to their distal aspects. No M1 stenosis or occlusion. Normal MCA bifurcations. Multifocal mild stenosis in several right M2 and M3 branches (series 1, images 120-144 and series 1, images 147-156)  and several left M2 and M3 branches (series 1, images 165-174 and series 1, images 137-159). Distal MCA branches otherwise perfused. Posterior circulation: Intermittent poor signal in the proximal right V4 segment, with better signal distally, likely retrograde flow. The left vertebral artery is patent to the vertebrobasilar junction. The left posteroinferior cerebellar artery is patent. The right PICA is not visualized. Basilar patent to its distal aspect. Superior cerebellar arteries patent proximally. Fetal origin of the left PCA without a definite left P1. Patent right P1. PCAs perfused to their distal aspects without stenosis. The  right posterior communicating artery is not visualized. Anatomic variants: Fetal origin of the left PCA. IMPRESSION: 1. Moderate to large area of acute infarct in the inferior right cerebellum, in the right PICA territory. On the MRA, the right vertebral artery demonstrates particularly poor signal proximally in the right PICA is not visualized. 2. Additional scattered, primarily cortical, acute infarcts in the bilateral frontal lobes, parietal lobes, and occipital lobes, as well as the left temporal lobe. 3. Multifocal mild narrowing in the right ACA and bilateral MCAs. Electronically Signed   By: Merilyn Baba M.D.   On: 08/03/2021 01:54   MR BRAIN WO CONTRAST  Result Date: 08/03/2021 CLINICAL DATA:  Mental status change, stroke suspected EXAM: MRI HEAD WITHOUT CONTRAST MRA HEAD WITHOUT CONTRAST TECHNIQUE: Multiplanar, multi-echo pulse sequences of the brain and surrounding structures were acquired without intravenous contrast. Angiographic images of the Circle of Willis were acquired using MRA technique without intravenous contrast. COMPARISON:  None Available. FINDINGS: MRI HEAD FINDINGS Brain: Moderate to large area of restricted diffusion with ADC correlate in the inferior right cerebellum (series 5, image 10), in the right PICA territory, measuring approximately 4.0 x 5.7 x  2.8 cm (AP x TR x CC) (series 5, image 10 and series 7, image 11). This causes mild mass effect on the left cerebral hemisphere. The fourth ventricle is likely narrowed but remains patent. No hydrocephalus. Additional scattered small areas and punctate foci of restricted diffusion are seen in the bilateral frontal lobes, parietal lobes, and occipital lobes, as well as the left temporal lobe and possibly the left fornix. These areas are predominantly cortical. No acute hemorrhage, mass, or cerebral midline shift. No extra-axial collection. Vascular: Loss of the right vertebral artery flow void. Otherwise normal arterial flow voids. Skull and upper cervical spine: Normal marrow signal. Sinuses/Orbits: No acute finding. Status post bilateral lens replacements. Other: Fluid in right mastoid air cells. MRA HEAD FINDINGS Anterior circulation: Both internal carotid arteries are patent to the termini, without significant stenosis. A1 segments patent, with a hypoplastic or mildly stenotic left A1. Normal anterior communicating artery. Intermittent poor flow in the right A2 and A3 segments anterior cerebral arteries are patent to their distal aspects. No M1 stenosis or occlusion. Normal MCA bifurcations. Multifocal mild stenosis in several right M2 and M3 branches (series 1, images 120-144 and series 1, images 147-156) and several left M2 and M3 branches (series 1, images 165-174 and series 1, images 137-159). Distal MCA branches otherwise perfused. Posterior circulation: Intermittent poor signal in the proximal right V4 segment, with better signal distally, likely retrograde flow. The left vertebral artery is patent to the vertebrobasilar junction. The left posteroinferior cerebellar artery is patent. The right PICA is not visualized. Basilar patent to its distal aspect. Superior cerebellar arteries patent proximally. Fetal origin of the left PCA without a definite left P1. Patent right P1. PCAs perfused to their distal  aspects without stenosis. The right posterior communicating artery is not visualized. Anatomic variants: Fetal origin of the left PCA. IMPRESSION: 1. Moderate to large area of acute infarct in the inferior right cerebellum, in the right PICA territory. On the MRA, the right vertebral artery demonstrates particularly poor signal proximally in the right PICA is not visualized. 2. Additional scattered, primarily cortical, acute infarcts in the bilateral frontal lobes, parietal lobes, and occipital lobes, as well as the left temporal lobe. 3. Multifocal mild narrowing in the right ACA and bilateral MCAs. Electronically Signed   By: Merilyn Baba M.D.   On:  08/03/2021 01:54   MR BRAIN WO CONTRAST  Result Date: 07/26/2021 CLINICAL DATA:  Provided history: Memory loss or impairment. Additional history provided by scanning technologist: Body tremors for 3 months, memory difficulty, multiple recent falls due to instability. EXAM: MRI HEAD WITHOUT CONTRAST TECHNIQUE: Multiplanar, multiecho pulse sequences of the brain and surrounding structures were obtained without intravenous contrast. COMPARISON:  Head CT 06/04/2021. FINDINGS: Brain: Mild generalized parenchymal atrophy. Chronic lacunar infarcts within the callosal genu, right lentiform nucleus and bilateral thalami. Mild to moderate multifocal T2 FLAIR hyperintense signal abnormality within the cerebral white matter and pons, nonspecific but compatible with chronic small vessel ischemic disease. There is no acute infarct. No evidence of an intracranial mass. No chronic intracranial blood products. No extra-axial fluid collection. No midline shift. Vascular: Maintained flow voids within the proximal large arterial vessels. Skull and upper cervical spine: No focal suspicious marrow lesion. Incompletely assessed cervical spondylosis. Sinuses/Orbits: No mass or acute finding within the imaged orbits. Prior bilateral ocular lens replacement. Minimal mucosal thickening  within the bilateral ethmoid sinuses. Other: Small-volume fluid within the right mastoid air cells. IMPRESSION: No evidence of acute intracranial abnormality. Chronic lacunar infarcts within the callosal genu, right lentiform nucleus and bilateral thalami. Mild-to-moderate chronic small vessel ischemic changes within the cerebral white matter and pons. Mild generalized parenchymal atrophy Minimal mucosal thickening within the bilateral ethmoid sinuses. Small-volume fluid within the right mastoid air cells. Electronically Signed   By: Kellie Simmering D.O.   On: 07/26/2021 19:07   IR Perc Cholecystostomy  Result Date: 08/02/2021 INDICATION: Acute calculus cholecystitis with sepsis EXAM: Placement of percutaneous cholecystostomy tube using ultrasound and fluoroscopic guidance MEDICATIONS: Per EMR ANESTHESIA/SEDATION: Local analgesia FLUOROSCOPY: Radiation Exposure Index (as provided by the fluoroscopic device): 1.3 minutes (18 mGy) COMPLICATIONS: None immediate. PROCEDURE: Informed written consent was obtained from the patient after a thorough discussion of the procedural risks, benefits and alternatives. All questions were addressed. Maximal Sterile Barrier Technique was utilized including caps, mask, sterile gowns, sterile gloves, sterile drape, hand hygiene and skin antiseptic. A timeout was performed prior to the initiation of the procedure. The patient was placed supine on the exam table. The right upper quadrant was prepped and draped in the standard sterile fashion. Ultrasound of the right upper quadrant was performed for planning purposes. This again demonstrated distended gallbladder with large gallstone in a dependent position. An intercostal transhepatic approach was planned. Skin entry site was marked, and local analgesia was obtained with 1% lidocaine. Under ultrasound guidance, percutaneous access was obtained into the gallbladder via an intercostal transhepatic approach using a 21 gauge Chiba needle.  Access was confirmed with visualization of needle tip within the gallbladder lumen, and free return of bile. An 018 Nitrex wire was then advanced through the access needle and coiled within the gallbladder lumen. A transition dilator was advanced over this wire, through which an antegrade cholecystogram was performed. Antegrade cholecystogram demonstrated appropriate location in the gallbladder lumen and gallbladder sludge with a large gallstone. Over an Amplatz wire, the percutaneous tract was serially dilated followed by placement of a 10 French locking multipurpose drainage catheter into the gallbladder lumen. Locking loop was formed. Additional biliary sludge was drained. Gentle hand injection of contrast material confirmed location of the gallbladder lumen. The drainage catheter was secured to the skin using silk suture and a dressing. It was placed to bag drainage. The patient tolerated the procedure well without immediate complication. IMPRESSION: Successful placement of a 10 French percutaneous cholecystostomy drainage catheter via an  intercostal transhepatic approach. Drain placed to bag drainage. Further follow-up recommendations per surgery. If drain remains after 6 weeks, patient to return to Interventional Radiology for routine catheter care. Electronically Signed   By: Albin Felling M.D.   On: 08/02/2021 11:28   DG Chest Portable 1 View  Result Date: 08/01/2021 CLINICAL DATA:  Altered mental status sepsis EXAM: PORTABLE CHEST 1 VIEW COMPARISON:  06/21/2020 FINDINGS: Borderline cardiac size. Vascular congestion. Heterogeneous asymmetric right greater than left interstitial and ground-glass opacity. No pleural effusion or pneumothorax. Aortic atherosclerosis. IMPRESSION: Asymmetric heterogeneous right greater than left interstitial and hazy pulmonary density which may be due to asymmetric edema or diffuse infection to include atypical process Electronically Signed   By: Donavan Foil M.D.   On:  08/01/2021 19:11   ECHOCARDIOGRAM COMPLETE  Result Date: 08/02/2021    ECHOCARDIOGRAM REPORT   Patient Name:   Misty [name] Date of Exam: 08/02/2021 Medical Rec #:  [id number]         Height:       63.0 in Accession #:    [id number]        Weight:       107.4 lb Date of Birth:  06/23/1940         BSA:          1.484 m Patient Age:    17 years          BP:           131/56 mmHg Patient Gender: F                 HR:           96 bpm. Exam Location:  [facility] Procedure: 2D Echo, Color Doppler and Cardiac Doppler Indications:     I50.31 congestive heart failure-Acute Diastolic  History:         Patient has prior history of Echocardiogram examinations. ESRD                  and TIA; Risk Factors:Hypertension, Diabetes and Dyslipidemia.  Sonographer:     Charmayne Sheer Referring Phys:  [id number] BRITTON L RUST-CHESTER Diagnosing Phys: Donnelly Angelica  Sonographer Comments: Suboptimal subcostal window. IMPRESSIONS  1. Left ventricular ejection fraction, by estimation, is 35 to 40%. The left ventricle has moderately decreased function. The left ventricle demonstrates regional wall motion abnormalities (see scoring diagram/findings for description). Left ventricular  diastolic parameters are consistent with Grade I diastolic dysfunction (impaired relaxation). There is akinesis of the left ventricular, entire apical segment.  2. Right ventricular systolic function is normal. The right ventricular size is normal.  3. The mitral valve is normal in structure. Mild mitral valve regurgitation. No evidence of mitral stenosis.  4. The aortic valve is calcified. Aortic valve regurgitation is moderate. Mild aortic valve stenosis. Aortic regurgitation PHT measures 242 msec.  5. Aortic dilatation noted. There is mild dilatation of the ascending aorta, measuring 38 mm. Comparison(s): Significant apical WMA in multiple vascular territories (? stress cardiomyopathy). AI appears worse. FINDINGS  Left Ventricle: Left ventricular ejection fraction,  by estimation, is 35 to 40%. The left ventricle has moderately decreased function. The left ventricle demonstrates regional wall motion abnormalities. The left ventricular internal cavity size was normal in size. There is no left ventricular hypertrophy. Left ventricular diastolic parameters are consistent with Grade I diastolic dysfunction (impaired relaxation). Right Ventricle: The right ventricular size is normal. Right vetricular wall thickness was not well visualized. Right ventricular systolic function is normal. Left  Atrium: Left atrial size was normal in size. Right Atrium: Right atrial size was normal in size. Pericardium: There is no evidence of pericardial effusion. Mitral Valve: The mitral valve is normal in structure. Mild mitral valve regurgitation. No evidence of mitral valve stenosis. MV peak gradient, 3.7 mmHg. The mean mitral valve gradient is 2.0 mmHg. Tricuspid Valve: The tricuspid valve is normal in structure. Tricuspid valve regurgitation is mild. Aortic Valve: The aortic valve is calcified. Aortic valve regurgitation is moderate. Aortic regurgitation PHT measures 242 msec. Mild aortic stenosis is present. Aortic valve mean gradient measures 10.0 mmHg. Aortic valve peak gradient measures 18.3 mmHg. Aortic valve area, by VTI measures 1.43 cm. Pulmonic Valve: The pulmonic valve was normal in structure. Aorta: Aortic dilatation noted. There is mild dilatation of the ascending aorta, measuring 38 mm. Venous: The inferior vena cava was not well visualized.  LEFT VENTRICLE PLAX 2D LVIDd:         4.03 cm      Diastology LVIDs:         3.10 cm      LV e' medial:    4.13 cm/s LV PW:         1.09 cm      LV E/e' medial:  11.9 LV IVS:        1.03 cm      LV e' lateral:   5.44 cm/s LVOT diam:     2.00 cm      LV E/e' lateral: 9.0 LV SV:         47 LV SV Index:   32 LVOT Area:     3.14 cm  LV Volumes (MOD) LV vol d, MOD A2C: 101.0 ml LV vol d, MOD A4C: 95.2 ml LV vol s, MOD A2C: 77.5 ml LV vol s, MOD A4C:  47.8 ml LV SV MOD A2C:     23.5 ml LV SV MOD A4C:     95.2 ml LV SV MOD BP:      41.1 ml RIGHT VENTRICLE RV Basal diam:  2.03 cm LEFT ATRIUM           Index        RIGHT ATRIUM          Index LA diam:      3.50 cm 2.36 cm/m   RA Area:     8.73 cm LA Vol (A2C): 28.4 ml 19.13 ml/m  RA Volume:   16.20 ml 10.91 ml/m LA Vol (A4C): 33.8 ml 22.77 ml/m  AORTIC VALVE                     PULMONIC VALVE AV Area (Vmax):    1.37 cm      PV Vmax:       0.73 m/s AV Area (Vmean):   1.27 cm      PV Vmean:      46.300 cm/s AV Area (VTI):     1.43 cm      PV VTI:        0.100 m AV Vmax:           214.00 cm/s   PV Peak grad:  2.1 mmHg AV Vmean:          147.000 cm/s  PV Mean grad:  1.0 mmHg AV VTI:            0.332 m AV Peak Grad:      18.3 mmHg AV Mean Grad:      10.0  mmHg LVOT Vmax:         93.30 cm/s LVOT Vmean:        59.500 cm/s LVOT VTI:          0.151 m LVOT/AV VTI ratio: 0.45 AI PHT:            242 msec  AORTA Ao Root diam: 3.00 cm MITRAL VALVE               TRICUSPID VALVE MV Area (PHT): 7.66 cm    TR Peak grad:   27.5 mmHg MV Area VTI:   3.70 cm    TR Vmax:        262.00 cm/s MV Peak grad:  3.7 mmHg MV Mean grad:  2.0 mmHg    SHUNTS MV Vmax:       0.96 m/s    Systemic VTI:  0.15 m MV Vmean:      58.5 cm/s   Systemic Diam: 2.00 cm MV Decel Time: 99 msec MV E velocity: 49.20 cm/s MV A velocity: 85.90 cm/s MV E/A ratio:  0.57 Donnelly Angelica Electronically signed by Donnelly Angelica Signature Date/Time: 08/02/2021/1:47:52 PM    Final    US ABDOMEN LIMITED RUQ (LIVER/GB)  Result Date: 08/01/2021 CLINICAL DATA:  epigastric pain, sepsis EXAM: ULTRASOUND ABDOMEN LIMITED RIGHT UPPER QUADRANT COMPARISON:  CT 08/01/2021 FINDINGS: Gallbladder: Dilated gallbladder with large intraluminal stone which measures up to 2.6 cm. No wall thickening or pericholecystic fluid. Reportedly positive sonographic Murphy sign. Common bile duct: Diameter: 4.6 mm, normal.  No intrahepatic biliary ductal dilation. Liver: Diffusely increased liver  echogenicity. Portal vein is patent on color Doppler imaging with normal direction of blood flow towards the liver. Other: Right pleural effusion. IMPRESSION: Dilated gallbladder with large intraluminal stone measuring up to 2.6 cm as seen on recent CT. No wall thickening or pericholecystic fluid. Reportedly positive sonographic Murphy sign, which is nonspecific in the setting of suspected pancreatitis. Correlate with exam and laboratory findings. Diffuse hepatic steatosis. Electronically Signed   By: Maurine Simmering M.D.   On: 08/01/2021 21:37    EKG: Sinus tachycardia.  Anterior Q waves  ASSESSMENT AND PLAN:  Marisue Canion is a 81 year old female with history of mild aortic stenosis, HFpEF, ESRD on PD, hypertension, type 2 diabetes who was admitted to the hospital with cholangitis.  Cardiology is consulted for abnormal echo.  #Apical predominant akinesis with preserved basal activity-consider stress cardiomyopathy versus LAD infarct/ischemia # Elevated troponin, can't r/o ACS # Acute HR with reduced EF #Severe sepsis due to cholangitis # Multiple acute infarcts Patient was admitted with severe sepsis due to cholangitis with a lactate greater than 9, in the setting was discovered to have a newly depressed ejection fraction with apical predominant akinesis.  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  EKG shows Q waves anteriorly.  Troponin was increased to 8000 and decreasing thereafter which may indicate a missed MI, but in this clinical situation seems more consistent with Takotsubo.  Developed worsening mental status overnight on 5/26 for which MRI showed multiple acute strokes. This is concerning for cardioembolic source (? LV thrombus) -Monitor on telemetry while inpatient -Start aspirin 81 mg  -Heparin infusion x48 hours- recommend resuming if OK by neurology.   - May need to repeat echo to evaluate for LV  thrombus with definity pending clinical course.  -We will consider ischemic evaluation pending clinical course.   Signed: Andrez Grime MD  08/03/2021, 7:47 AM

## 2021-08-03 NOTE — Progress Notes (Signed)
Pharmacy Antibiotic Note  Misty [name] is a 81 y.o. female admitted on 08/01/2021 with sepsis.  Pharmacy has been consulted for meropenem dosing. Pt was on cefepime but NP wants to d/c due to neuro sx.   Will d/c flagyl as well.   Plan: Cefepime 1 gm IV given on 5/26 @ 2009. Metronidazole 500 mg IV given on 5/26 @ 2053.   Meropenem 1 gm IV Q24H ordered to start on 5/27  @ 0900.   Height:  (unable to get weight. bed scale is error) Weight: 48.5 kg (106 lb 14.8 oz)  Temp (24hrs), Avg:98.8 F (37.1 C), Min:97.9 F (36.6 C), Max:99.8 F (37.7 C)  Recent Labs  Lab 08/01/21 1804 08/01/21 2054 08/02/21 0115 08/02/21 0400 08/02/21 0511 08/02/21 0842 08/02/21 1010 08/02/21 1206 08/02/21 1446  WBC 15.9*  --   --  17.6*  --   --   --   --   --   CREATININE 4.02*  --  4.39*  --  4.22*  --  4.14* 4.08* 4.19*  LATICACIDVEN 4.8*   < > >9.0* 0.8 7.7* 4.7*  --  3.6*  --    < > = values in this interval not displayed.    Estimated Creatinine Clearance: 8.5 mL/min (A) (by C-G formula based on SCr of 4.19 mg/dL (H)).    Allergies  Allergen Reactions   Succinylcholine Other (See Comments)    Severe myalgias   Penicillins Rash    Tolerated cefazolin prior    Antimicrobials this admission:   >>    >>   Dose adjustments this admission:   Microbiology results:  BCx:   UCx:    Sputum:    MRSA PCR:   Thank you for allowing pharmacy to be a part of this patient's care.  Kelli Egolf D 08/03/2021 12:16 AM

## 2021-08-03 NOTE — Progress Notes (Signed)
[facility]  ROUNDING NOTE   Subjective:   Misty [name] is a 81 year old Caucasian female with past medical history including GERD, hyperlipidemia, hypertension, anemia, and lower back pain, osteoarthritis, diabetes, TIA, and end-stage renal disease on peritoneal dialysis.  Patient presents to the emergency department with altered mental status and nausea and vomiting.  Patient has been admitted for Dehydration [E86.0] ESRD on peritoneal dialysis ([place]) [N18.6, Z99.2] Abdominal pain [R10.9] Altered mental status, unspecified altered mental status type [R41.82] Sepsis due to pneumonia ([place]) [J18.9, A41.9] Acute pancreatitis, unspecified complication status, unspecified pancreatitis type [K85.90] AMS (altered mental status) [R41.82]  Patient is known to our practice and peritoneal dialysis is monitored by Dr. Holley Raring in the [facility] clinic.     Patient seen resting quietly in bed.  Husband in hallway speaking on telephone.  Patient nonresponsive to verbal stimuli.  Only responds to painful stimuli with eyes open with leftward gaze.  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  Also concern for MI.  Objective:  Vital signs in last 24 hours:  Temp:  [97.9 F (36.6 C)-99.8 F (37.7 C)] 98.2 F (36.8 C) (05/27 0400) Pulse Rate:  [84-130] 116 (05/27 0900) Resp:  [14-30] 25 (05/27 0900) BP: (88-244)/(43-213) 143/90 (05/27 0900) SpO2:  [95 %-100 %] 100 % (05/27 0900) Weight:  [48.5 kg-49.2 kg] 48.5 kg (05/26 1952)  Weight change: -1.196 kg Filed Weights   08/02/21 0715 08/02/21 1815 08/02/21 1952  Weight: 48.7 kg 49.2 kg 48.5 kg    Intake/Output: I/O last 3 completed shifts: In: 6076.2 [I.V.:441.7; CBSWH:6759; IV Piggyback:2506.5] Out: 1638 [Urine:265; Drains:220; Other:3070]   Intake/Output this shift:  No intake/output data recorded.  Physical Exam: General: Critically ill-appearing  Head: Normocephalic, atraumatic. Moist oral mucosal membranes  Eyes:  Anicteric  Lungs:  Clear to auscultation, normal effort  Heart: Regular rate and rhythm  Abdomen:  Soft, nontender, PDC  Extremities: No peripheral edema.  Neurologic: Response to painful stimuli only.  Skin: No lesions  Access: PD catheter    Basic Metabolic Panel: Recent Labs  Lab 08/02/21 0115 08/02/21 0511 08/02/21 1010 08/02/21 1206 08/02/21 1446 08/02/21 2250 08/03/21 0229  NA 136 145 147* 144 148*  --  145  K 3.5 3.5 3.4* 3.5 3.7  --  4.9  CL 102 105 107 108 109  --  109  CO2 12* '24 26 26 24  '$ --  23  GLUCOSE 677* 422* 315* 244* 145*  --  202*  BUN 36* 37* 36* 36* 37*  --  45*  CREATININE 4.39* 4.22* 4.14* 4.08* 4.19*  --  4.86*  CALCIUM 9.3 9.6 9.6 9.3 9.3  --  8.6*  MG 2.3  --   --   --   --  2.5* 2.5*  PHOS 7.4*  --   --   --   --   --  7.8*     Liver Function Tests: Recent Labs  Lab 08/01/21 1804 08/02/21 0115 08/02/21 0511 08/03/21 0229  AST 38 70* 56* 205*  ALT '15 16 17 '$ 56*  ALKPHOS 107 94 93 67  BILITOT 0.9 0.8 0.5 0.7  PROT 6.5 5.9* 6.1* 5.0*  ALBUMIN 2.4* 2.2* 2.4* 1.9*    Recent Labs  Lab 08/01/21 1804  LIPASE 361*    Recent Labs  Lab 08/02/21 2250  AMMONIA 24    CBC: Recent Labs  Lab 08/01/21 1804 08/02/21 0400 08/03/21 0229  WBC 15.9* 17.6* 14.9*  NEUTROABS 14.6*  --   --  HGB 12.6 10.2* 9.2*  HCT 39.9 32.9* 29.2*  MCV 95.5 96.2 97.0  PLT 422* 386 188     Cardiac Enzymes: No results for input(s): CKTOTAL, CKMB, CKMBINDEX, TROPONINI in the last 168 hours.  BNP: Invalid input(s): POCBNP  CBG: Recent Labs  Lab 08/02/21 1831 08/02/21 1938 08/02/21 2309 08/03/21 0316 08/03/21 0743  GLUCAP 154* 206* 230* 169* 167*     Microbiology: Results for orders placed or performed during the hospital encounter of 08/01/21  Culture, blood (Routine x 2)     Status: None (Preliminary result)   Collection Time: 08/01/21  6:04 PM   Specimen: BLOOD  Result Value Ref Range Status   Specimen Description BLOOD LEFT FOREARM   Final   Special Requests   Final    BOTTLES DRAWN AEROBIC AND ANAEROBIC Blood Culture results may not be optimal due to an excessive volume of blood received in culture bottles   Culture   Final    NO GROWTH 2 DAYS Performed at [facility], [address], [place], [place] [address]    Report Status PENDING  Incomplete  Culture, blood (Routine x 2)     Status: None (Preliminary result)   Collection Time: 08/01/21  6:04 PM   Specimen: BLOOD  Result Value Ref Range Status   Specimen Description BLOOD LEFT ASSIST CONTROL  Final   Special Requests   Final    BOTTLES DRAWN AEROBIC AND ANAEROBIC Blood Culture results may not be optimal due to an excessive volume of blood received in culture bottles   Culture   Final    NO GROWTH 2 DAYS Performed at [facility], [address] [place], [place] [address]    Report Status PENDING  Incomplete  Resp Panel by RT-PCR (Flu A&B, Covid) Anterior Nasal Swab     Status: None   Collection Time: 08/01/21  6:14 PM   Specimen: Anterior Nasal Swab  Result Value Ref Range Status   SARS Coronavirus 2 by RT PCR NEGATIVE NEGATIVE Final    Comment: (NOTE) SARS-CoV-2 target nucleic acids are NOT DETECTED.  The SARS-CoV-2 RNA is generally detectable in upper respiratory specimens during the acute phase of infection. The lowest concentration of SARS-CoV-2 viral copies this assay can detect is 138 copies/mL. A negative result does not preclude SARS-Cov-2 infection and should not be used as the sole basis for treatment or other patient management decisions. A negative result may occur with  improper specimen collection/handling, submission of specimen other than nasopharyngeal swab, presence of viral mutation(s) within the areas targeted by this assay, and inadequate number of viral copies(<138 copies/mL). A negative result must be combined with clinical observations, patient history, and epidemiological information. The expected result  is Negative.  Fact Sheet for Patients:  EntrepreneurPulse.com.au  Fact Sheet for Healthcare Providers:  IncredibleEmployment.be  This test is no t yet approved or cleared by the [place] FDA and  has been authorized for detection and/or diagnosis of SARS-CoV-2 by FDA under an Emergency Use Authorization (EUA). This EUA will remain  in effect (meaning this test can be used) for the duration of the COVID-19 declaration under Section 564(b)(1) of the Act, 21 U.S.C.section 360bbb-3(b)(1), unless the authorization is terminated  or revoked sooner.       Influenza A by PCR NEGATIVE NEGATIVE Final   Influenza B by PCR NEGATIVE NEGATIVE Final    Comment: (NOTE) The Xpert Xpress SARS-CoV-2/FLU/RSV plus assay is intended as an aid in the diagnosis of influenza from  Nasopharyngeal swab specimens and should not be used as a sole basis for treatment. Nasal washings and aspirates are unacceptable for Xpert Xpress SARS-CoV-2/FLU/RSV testing.  Fact Sheet for Patients: EntrepreneurPulse.com.au  Fact Sheet for Healthcare Providers: IncredibleEmployment.be  This test is not yet approved or cleared by the [place] FDA and has been authorized for detection and/or diagnosis of SARS-CoV-2 by FDA under an Emergency Use Authorization (EUA). This EUA will remain in effect (meaning this test can be used) for the duration of the COVID-19 declaration under Section 564(b)(1) of the Act, 21 U.S.C. section 360bbb-3(b)(1), unless the authorization is terminated or revoked.  Performed at [facility], [place], [place] [address]   Respiratory (~20 pathogens) panel by PCR     Status: None   Collection Time: 08/01/21  6:14 PM   Specimen: Nasopharyngeal Swab; Respiratory  Result Value Ref Range Status   Adenovirus NOT DETECTED NOT DETECTED Final   Coronavirus 229E NOT DETECTED NOT DETECTED Final     Comment: (NOTE) The Coronavirus on the Respiratory Panel, DOES NOT test for the novel  Coronavirus (2019 nCoV)    Coronavirus HKU1 NOT DETECTED NOT DETECTED Final   Coronavirus NL63 NOT DETECTED NOT DETECTED Final   Coronavirus OC43 NOT DETECTED NOT DETECTED Final   Metapneumovirus NOT DETECTED NOT DETECTED Final   Rhinovirus / Enterovirus NOT DETECTED NOT DETECTED Final   Influenza A NOT DETECTED NOT DETECTED Final   Influenza B NOT DETECTED NOT DETECTED Final   Parainfluenza Virus 1 NOT DETECTED NOT DETECTED Final   Parainfluenza Virus 2 NOT DETECTED NOT DETECTED Final   Parainfluenza Virus 3 NOT DETECTED NOT DETECTED Final   Parainfluenza Virus 4 NOT DETECTED NOT DETECTED Final   Respiratory Syncytial Virus NOT DETECTED NOT DETECTED Final   Bordetella pertussis NOT DETECTED NOT DETECTED Final   Bordetella Parapertussis NOT DETECTED NOT DETECTED Final   Chlamydophila pneumoniae NOT DETECTED NOT DETECTED Final   Mycoplasma pneumoniae NOT DETECTED NOT DETECTED Final    Comment: Performed at [facility], [place]. [address], [place], [place] [address]  Body fluid culture w Gram Stain     Status: None (Preliminary result)   Collection Time: 08/01/21  8:21 PM   Specimen: Peritoneal Dialysate; Body Fluid  Result Value Ref Range Status   Specimen Description   Final    PERITONEAL DIALYSATE Performed at [facility], [address], [place], [place] [address]    Special Requests   Final    NONE Performed at [facility], [address] [place], [place] [address]    Gram Stain   Final    CYTOSPIN SMEAR WBC PRESENT, PREDOMINANTLY MONONUCLEAR NO ORGANISMS SEEN    Culture   Final    NO GROWTH 2 DAYS Performed at [facility], [place] [address], [place], [place] [address]    Report Status PENDING  Incomplete  MRSA Next Gen by PCR, Nasal     Status: None   Collection Time: 08/01/21 11:49 PM   Specimen: Nasal Mucosa; Nasal Swab  Result Value Ref Range Status    MRSA by PCR Next Gen NOT DETECTED NOT DETECTED Final    Comment: (NOTE) The GeneXpert MRSA Assay (FDA approved for NASAL specimens only), is one component of a comprehensive MRSA colonization surveillance program. It is not intended to diagnose MRSA infection nor to guide or monitor treatment for MRSA infections. Test performance is not FDA approved in patients less than 76 years old. Performed  at [facility], [address] [place], [place] [address]   Body fluid culture w Gram Stain     Status: None (Preliminary result)   Collection Time: 08/02/21 12:31 PM   Specimen: Gallbladder; Body Fluid  Result Value Ref Range Status   Specimen Description   Final    GALL BLADDER Performed at [facility], [address] [place], [place] [address]    Special Requests   Final    NONE Performed at [facility], [address] [place], [place] [address]    Gram Stain   Final    NO WBC SEEN ABUNDANT GRAM POSITIVE COCCI IN PAIRS Performed at [facility], [place] [address] [place], [place] [address]    Culture PENDING  Incomplete   Report Status PENDING  Incomplete    Coagulation Studies: Recent Labs    08/01/21 1804  LABPROT 13.8  INR 1.1     Urinalysis: Recent Labs    08/01/21 1802  COLORURINE YELLOW*  LABSPEC 1.023  PHURINE 6.0  GLUCOSEU >=500*  HGBUR NEGATIVE  BILIRUBINUR NEGATIVE  KETONESUR 5*  PROTEINUR >=300*  NITRITE NEGATIVE  LEUKOCYTESUR NEGATIVE       Imaging: CT ABDOMEN PELVIS WO CONTRAST  Result Date: 08/01/2021 CLINICAL DATA:  Nausea and vomiting EXAM: CT ABDOMEN AND PELVIS WITHOUT CONTRAST TECHNIQUE: Multidetector CT imaging of the abdomen and pelvis was performed following the standard protocol without IV contrast. RADIATION DOSE REDUCTION: This exam was performed according to the departmental dose-optimization program which includes automated exposure control, adjustment of the mA and/or kV according to patient  size and/or use of iterative reconstruction technique. COMPARISON:  CTA abdomen and pelvis 06/05/2021 FINDINGS: Lower chest: Small right and trace left pleural effusions. Hazy and irregular densities in the lower lungs likely represent subsegmental atelectasis and possibly infiltrates, greater on the right. Hepatobiliary: Liver is normal in size with no focal mass identified. Gallbladder is significantly distended and contains a 2.6 cm calculus. No significant gallbladder wall thickening or surrounding inflammatory changes. No biliary ductal dilatation appreciated. Pancreas: Mild peripancreatic fat stranding at the tail the pancreas. No pancreatic ductal dilatation. Spleen: Normal size with several tiny calcified granulomas. Adrenals/Urinary Tract: Adrenal glands appear normal. Kidneys are atrophic with scattered likely vascular calcifications. No definite nephrolithiasis or hydronephrosis identified bilaterally. Urinary bladder appears normal. Stomach/Bowel: Tiny hiatal hernia. No bowel obstruction, free air or pneumatosis. No bowel wall edema identified. No evidence of acute appendicitis. Vascular/Lymphatic: Severe atherosclerotic disease including large nearly occlusive appearing calcified plaque in the mid abdominal aorta. No bulky lymphadenopathy identified. Reproductive: Small uterus with calcifications. Other: Small volume ascites mostly in the upper abdomen. Stable loculated fluid collection in the right pelvis which appears chronic and likely postsurgical in nature. Stable chronic area of fat necrosis in the mid abdomen near the level of the umbilicus. A percutaneous catheter is again seen with the tip coiled in the pelvis. Musculoskeletal: Advanced degenerative changes of the spine. No suspicious bony lesions visualized. IMPRESSION: 1. Mild peripancreatic edema which could be secondary to mild acute pancreatitis or related to the small volume ascites. Correlate clinically. 2. Cholelithiasis and markedly  distended gallbladder. No gallbladder wall thickening or surrounding inflammatory changes visualized. Correlate clinically and consider follow-up ultrasound if indicated. 3. Small right pleural effusion and trace left pleural effusion. Associated subsegmental atelectatic changes and possibly infiltrates in the lower lungs. 4. Severe atherosclerotic disease. 5. Other chronic findings as described. Electronically Signed   By: Tiburcio Pea.D.  On: 08/01/2021 19:57   CT HEAD WO CONTRAST (5MM)  Result Date: 08/03/2021 CLINICAL DATA:  Stroke follow-up EXAM: CT HEAD WITHOUT CONTRAST TECHNIQUE: Contiguous axial images were obtained from the base of the skull through the vertex without intravenous contrast. RADIATION DOSE REDUCTION: This exam was performed according to the departmental dose-optimization program which includes automated exposure control, adjustment of the mA and/or kV according to patient size and/or use of iterative reconstruction technique. COMPARISON:  Yesterday FINDINGS: Brain: Infarct in the inferior right cerebellum with narrowing of the fourth ventricle but no hydrocephalus or change in ventricular volume. Underestimated cerebral ischemia compared to MRI. Chronic small vessel ischemic gliosis in the hemispheric white matter. Chronic lacunar infarcts at the deep gray nuclei. Negative for hemorrhage. Vascular: No hyperdense vessel or unexpected calcification. Skull: Normal. Negative for fracture or focal lesion. Sinuses/Orbits: No acute finding. IMPRESSION: No progression or hemorrhage at the right cerebellar infarct. Fourth ventricular narrowing without hydrocephalus or change in ventricular volume. Electronically Signed   By: Jorje Guild M.D.   On: 08/03/2021 06:28   CT HEAD WO CONTRAST (5MM)  Result Date: 08/02/2021 CLINICAL DATA:  Mental status change EXAM: CT HEAD WITHOUT CONTRAST TECHNIQUE: Contiguous axial images were obtained from the base of the skull through the vertex  without intravenous contrast. RADIATION DOSE REDUCTION: This exam was performed according to the departmental dose-optimization program which includes automated exposure control, adjustment of the mA and/or kV according to patient size and/or use of iterative reconstruction technique. COMPARISON:  CT brain 08/01/2021 FINDINGS: Brain: Interval moderate hypodensity within the inferior right cerebellum and vermis, consistent with edema and acute infarct. No hemorrhage. Minimal mass effect on fourth ventricle. The ventricle size is stable. Atrophy and chronic small vessel ischemic changes of the white matter. Chronic lacunar infarcts in the basal ganglia and thalamus Vascular: No hyperdense vessels. Vertebral and carotid vascular calcification Skull: Normal. Negative for fracture or focal lesion. Sinuses/Orbits: No acute finding. Other: None IMPRESSION: 1. Interval development of moderate area of hypodensity within the right cerebellum, consistent with an acute infarct. No hemorrhage. Mild mass effect on fourth ventricle but no significant interval ventricular enlargement since CT performed yesterday 2. Atrophy and chronic small vessel ischemic changes of the white matter These results will be called to the ordering clinician or representative by the Radiologist Assistant, and communication documented in the PACS or [employer]. Electronically Signed   By: Donavan Foil M.D.   On: 08/02/2021 23:33   CT Head Wo Contrast  Result Date: 08/01/2021 CLINICAL DATA:  Mental status change of unknown cause beginning 2 days ago. Vomiting. EXAM: CT HEAD WITHOUT CONTRAST TECHNIQUE: Contiguous axial images were obtained from the base of the skull through the vertex without intravenous contrast. RADIATION DOSE REDUCTION: This exam was performed according to the departmental dose-optimization program which includes automated exposure control, adjustment of the mA and/or kV according to patient size and/or use of iterative  reconstruction technique. COMPARISON:  MRI 07/26/2021.  CT 06/04/2021 FINDINGS: Brain: No acute finding. Age related atrophy. Chronic small-vessel ischemic changes of the white matter. 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 No sign of acute infarction, mass lesion, hemorrhage, hydrocephalus or extra-axial collection. Vascular: There is atherosclerotic calcification of the major vessels at the base of the brain. Skull: Negative Sinuses/Orbits: Clear/normal Other: None IMPRESSION: No acute finding. Atrophy and chronic small-vessel ischemic changes as above, similar to previous exams. Electronically Signed   By: Nelson Chimes M.D.   On: 08/01/2021 18:50  MR ANGIO HEAD WO CONTRAST  Result Date: 08/03/2021 CLINICAL DATA:  Mental status change, stroke suspected EXAM: MRI HEAD WITHOUT CONTRAST MRA HEAD WITHOUT CONTRAST TECHNIQUE: Multiplanar, multi-echo pulse sequences of the brain and surrounding structures were acquired without intravenous contrast. Angiographic images of the Circle of Willis were acquired using MRA technique without intravenous contrast. COMPARISON:  None Available. FINDINGS: MRI HEAD FINDINGS Brain: Moderate to large area of restricted diffusion with ADC correlate in the inferior right cerebellum (series 5, image 10), in the right PICA territory, measuring approximately 4.0 x 5.7 x 2.8 cm (AP x TR x CC) (series 5, image 10 and series 7, image 11). This causes mild mass effect on the left cerebral hemisphere. The fourth ventricle is likely narrowed but remains patent. No hydrocephalus. Additional scattered small areas and punctate foci of restricted diffusion are seen in the bilateral frontal lobes, parietal lobes, and occipital lobes, as well as the left temporal lobe and possibly the left fornix. These areas are predominantly cortical. No acute hemorrhage, mass, or cerebral midline shift. No extra-axial collection. Vascular: Loss of the right vertebral artery flow void.  Otherwise normal arterial flow voids. Skull and upper cervical spine: Normal marrow signal. Sinuses/Orbits: No acute finding. Status post bilateral lens replacements. Other: Fluid in right mastoid air cells. MRA HEAD FINDINGS Anterior circulation: Both internal carotid arteries are patent to the termini, without significant stenosis. A1 segments patent, with a hypoplastic or mildly stenotic left A1. Normal anterior communicating artery. Intermittent poor flow in the right A2 and A3 segments anterior cerebral arteries are patent to their distal aspects. No M1 stenosis or occlusion. Normal MCA bifurcations. Multifocal mild stenosis in several right M2 and M3 branches (series 1, images 120-144 and series 1, images 147-156) and several left M2 and M3 branches (series 1, images 165-174 and series 1, images 137-159). Distal MCA branches otherwise perfused. Posterior circulation: Intermittent poor signal in the proximal right V4 segment, with better signal distally, likely retrograde flow. The left vertebral artery is patent to the vertebrobasilar junction. The left posteroinferior cerebellar artery is patent. The right PICA is not visualized. Basilar patent to its distal aspect. Superior cerebellar arteries patent proximally. Fetal origin of the left PCA without a definite left P1. Patent right P1. PCAs perfused to their distal aspects without stenosis. The right posterior communicating artery is not visualized. Anatomic variants: Fetal origin of the left PCA. IMPRESSION: 1. Moderate to large area of acute infarct in the inferior right cerebellum, in the right PICA territory. On the MRA, the right vertebral artery demonstrates particularly poor signal proximally in the right PICA is not visualized. 2. Additional scattered, primarily cortical, acute infarcts in the bilateral frontal lobes, parietal lobes, and occipital lobes, as well as the left temporal lobe. 3. Multifocal mild narrowing in the right ACA and bilateral  MCAs. Electronically Signed   By: Merilyn Baba M.D.   On: 08/03/2021 01:54   MR BRAIN WO CONTRAST  Result Date: 08/03/2021 CLINICAL DATA:  Mental status change, stroke suspected EXAM: MRI HEAD WITHOUT CONTRAST MRA HEAD WITHOUT CONTRAST TECHNIQUE: Multiplanar, multi-echo pulse sequences of the brain and surrounding structures were acquired without intravenous contrast. Angiographic images of the Circle of Willis were acquired using MRA technique without intravenous contrast. COMPARISON:  None Available. FINDINGS: MRI HEAD FINDINGS Brain: Moderate to large area of restricted diffusion with ADC correlate in the inferior right cerebellum (series 5, image 10), in the right PICA territory, measuring approximately 4.0 x 5.7 x 2.8 cm (AP x  TR x CC) (series 5, image 10 and series 7, image 11). This causes mild mass effect on the left cerebral hemisphere. The fourth ventricle is likely narrowed but remains patent. No hydrocephalus. Additional scattered small areas and punctate foci of restricted diffusion are seen in the bilateral frontal lobes, parietal lobes, and occipital lobes, as well as the left temporal lobe and possibly the left fornix. These areas are predominantly cortical. No acute hemorrhage, mass, or cerebral midline shift. No extra-axial collection. Vascular: Loss of the right vertebral artery flow void. Otherwise normal arterial flow voids. Skull and upper cervical spine: Normal marrow signal. Sinuses/Orbits: No acute finding. Status post bilateral lens replacements. Other: Fluid in right mastoid air cells. MRA HEAD FINDINGS Anterior circulation: Both internal carotid arteries are patent to the termini, without significant stenosis. A1 segments patent, with a hypoplastic or mildly stenotic left A1. Normal anterior communicating artery. Intermittent poor flow in the right A2 and A3 segments anterior cerebral arteries are patent to their distal aspects. No M1 stenosis or occlusion. Normal MCA bifurcations.  Multifocal mild stenosis in several right M2 and M3 branches (series 1, images 120-144 and series 1, images 147-156) and several left M2 and M3 branches (series 1, images 165-174 and series 1, images 137-159). Distal MCA branches otherwise perfused. Posterior circulation: Intermittent poor signal in the proximal right V4 segment, with better signal distally, likely retrograde flow. The left vertebral artery is patent to the vertebrobasilar junction. The left posteroinferior cerebellar artery is patent. The right PICA is not visualized. Basilar patent to its distal aspect. Superior cerebellar arteries patent proximally. Fetal origin of the left PCA without a definite left P1. Patent right P1. PCAs perfused to their distal aspects without stenosis. The right posterior communicating artery is not visualized. Anatomic variants: Fetal origin of the left PCA. IMPRESSION: 1. Moderate to large area of acute infarct in the inferior right cerebellum, in the right PICA territory. On the MRA, the right vertebral artery demonstrates particularly poor signal proximally in the right PICA is not visualized. 2. Additional scattered, primarily cortical, acute infarcts in the bilateral frontal lobes, parietal lobes, and occipital lobes, as well as the left temporal lobe. 3. Multifocal mild narrowing in the right ACA and bilateral MCAs. Electronically Signed   By: Merilyn Baba M.D.   On: 08/03/2021 01:54   IR Perc Cholecystostomy  Result Date: 08/02/2021 INDICATION: Acute calculus cholecystitis with sepsis EXAM: Placement of percutaneous cholecystostomy tube using ultrasound and fluoroscopic guidance MEDICATIONS: Per EMR ANESTHESIA/SEDATION: Local analgesia FLUOROSCOPY: Radiation Exposure Index (as provided by the fluoroscopic device): 1.3 minutes (18 mGy) COMPLICATIONS: None immediate. PROCEDURE: Informed written consent was obtained from the patient after a thorough discussion of the procedural risks, benefits and alternatives.  All questions were addressed. Maximal Sterile Barrier Technique was utilized including caps, mask, sterile gowns, sterile gloves, sterile drape, hand hygiene and skin antiseptic. A timeout was performed prior to the initiation of the procedure. The patient was placed supine on the exam table. The right upper quadrant was prepped and draped in the standard sterile fashion. Ultrasound of the right upper quadrant was performed for planning purposes. This again demonstrated distended gallbladder with large gallstone in a dependent position. An intercostal transhepatic approach was planned. Skin entry site was marked, and local analgesia was obtained with 1% lidocaine. Under ultrasound guidance, percutaneous access was obtained into the gallbladder via an intercostal transhepatic approach using a 21 gauge Chiba needle. Access was confirmed with visualization of needle tip within the gallbladder lumen,  and free return of bile. An 018 Nitrex wire was then advanced through the access needle and coiled within the gallbladder lumen. A transition dilator was advanced over this wire, through which an antegrade cholecystogram was performed. Antegrade cholecystogram demonstrated appropriate location in the gallbladder lumen and gallbladder sludge with a large gallstone. Over an Amplatz wire, the percutaneous tract was serially dilated followed by placement of a 10 French locking multipurpose drainage catheter into the gallbladder lumen. Locking loop was formed. Additional biliary sludge was drained. Gentle hand injection of contrast material confirmed location of the gallbladder lumen. The drainage catheter was secured to the skin using silk suture and a dressing. It was placed to bag drainage. The patient tolerated the procedure well without immediate complication. IMPRESSION: Successful placement of a 10 French percutaneous cholecystostomy drainage catheter via an intercostal transhepatic approach. Drain placed to bag drainage.  Further follow-up recommendations per surgery. If drain remains after 6 weeks, patient to return to Interventional Radiology for routine catheter care. Electronically Signed   By: Albin Felling M.D.   On: 08/02/2021 11:28   DG Chest Portable 1 View  Result Date: 08/01/2021 CLINICAL DATA:  Altered mental status sepsis EXAM: PORTABLE CHEST 1 VIEW COMPARISON:  06/21/2020 FINDINGS: Borderline cardiac size. Vascular congestion. Heterogeneous asymmetric right greater than left interstitial and ground-glass opacity. No pleural effusion or pneumothorax. Aortic atherosclerosis. IMPRESSION: Asymmetric heterogeneous right greater than left interstitial and hazy pulmonary density which may be due to asymmetric edema or diffuse infection to include atypical process Electronically Signed   By: Donavan Foil M.D.   On: 08/01/2021 19:11   ECHOCARDIOGRAM COMPLETE  Result Date: 08/02/2021    ECHOCARDIOGRAM REPORT   Patient Name:   DEAJAH ERKKILA Date of Exam: 08/02/2021 Medical Rec #:  [id number]         Height:       63.0 in Accession #:    [id number]        Weight:       107.4 lb Date of Birth:  1940/03/30         BSA:          1.484 m Patient Age:    18 years          BP:           131/56 mmHg Patient Gender: F                 HR:           96 bpm. Exam Location:  [facility] Procedure: 2D Echo, Color Doppler and Cardiac Doppler Indications:     I50.31 congestive heart failure-Acute Diastolic  History:         Patient has prior history of Echocardiogram examinations. ESRD                  and TIA; Risk Factors:Hypertension, Diabetes and Dyslipidemia.  Sonographer:     Charmayne Sheer Referring Phys:  [id number] BRITTON L R[place] Diagnosing Phys: Donnelly Angelica  Sonographer Comments: Suboptimal subcostal window. IMPRESSIONS  1. Left ventricular ejection fraction, by estimation, is 35 to 40%. The left ventricle has moderately decreased function. The left ventricle demonstrates regional wall motion abnormalities (see scoring  diagram/findings for description). Left ventricular  diastolic parameters are consistent with Grade I diastolic dysfunction (impaired relaxation). There is akinesis of the left ventricular, entire apical segment.  2. Right ventricular systolic function is normal. The right ventricular size is normal.  3. The mitral valve is normal in structure. Mild  mitral valve regurgitation. No evidence of mitral stenosis.  4. The aortic valve is calcified. Aortic valve regurgitation is moderate. Mild aortic valve stenosis. Aortic regurgitation PHT measures 242 msec.  5. Aortic dilatation noted. There is mild dilatation of the ascending aorta, measuring 38 mm. Comparison(s): Significant apical WMA in multiple vascular territories (? stress cardiomyopathy). AI appears worse. FINDINGS  Left Ventricle: Left ventricular ejection fraction, by estimation, is 35 to 40%. The left ventricle has moderately decreased function. The left ventricle demonstrates regional wall motion abnormalities. The left ventricular internal cavity size was normal in size. There is no left ventricular hypertrophy. Left ventricular diastolic parameters are consistent with Grade I diastolic dysfunction (impaired relaxation). Right Ventricle: The right ventricular size is normal. Right vetricular wall thickness was not well visualized. Right ventricular systolic function is normal. Left Atrium: Left atrial size was normal in size. Right Atrium: Right atrial size was normal in size. Pericardium: There is no evidence of pericardial effusion. Mitral Valve: The mitral valve is normal in structure. Mild mitral valve regurgitation. No evidence of mitral valve stenosis. MV peak gradient, 3.7 mmHg. The mean mitral valve gradient is 2.0 mmHg. Tricuspid Valve: The tricuspid valve is normal in structure. Tricuspid valve regurgitation is mild. Aortic Valve: The aortic valve is calcified. Aortic valve regurgitation is moderate. Aortic regurgitation PHT measures 242 msec. Mild  aortic stenosis is present. Aortic valve mean gradient measures 10.0 mmHg. Aortic valve peak gradient measures 18.3 mmHg. Aortic valve area, by VTI measures 1.43 cm. Pulmonic Valve: The pulmonic valve was normal in structure. Aorta: Aortic dilatation noted. There is mild dilatation of the ascending aorta, measuring 38 mm. Venous: The inferior vena cava was not well visualized.  LEFT VENTRICLE PLAX 2D LVIDd:         4.03 cm      Diastology LVIDs:         3.10 cm      LV e' medial:    4.13 cm/s LV PW:         1.09 cm      LV E/e' medial:  11.9 LV IVS:        1.03 cm      LV e' lateral:   5.44 cm/s LVOT diam:     2.00 cm      LV E/e' lateral: 9.0 LV SV:         47 LV SV Index:   32 LVOT Area:     3.14 cm  LV Volumes (MOD) LV vol d, MOD A2C: 101.0 ml LV vol d, MOD A4C: 95.2 ml LV vol s, MOD A2C: 77.5 ml LV vol s, MOD A4C: 47.8 ml LV SV MOD A2C:     23.5 ml LV SV MOD A4C:     95.2 ml LV SV MOD BP:      41.1 ml RIGHT VENTRICLE RV Basal diam:  2.03 cm LEFT ATRIUM           Index        RIGHT ATRIUM          Index LA diam:      3.50 cm 2.36 cm/m   RA Area:     8.73 cm LA Vol (A2C): 28.4 ml 19.13 ml/m  RA Volume:   16.20 ml 10.91 ml/m LA Vol (A4C): 33.8 ml 22.77 ml/m  AORTIC VALVE                     PULMONIC VALVE AV Area (Vmax):  1.37 cm      PV Vmax:       0.73 m/s AV Area (Vmean):   1.27 cm      PV Vmean:      46.300 cm/s AV Area (VTI):     1.43 cm      PV VTI:        0.100 m AV Vmax:           214.00 cm/s   PV Peak grad:  2.1 mmHg AV Vmean:          147.000 cm/s  PV Mean grad:  1.0 mmHg AV VTI:            0.332 m AV Peak Grad:      18.3 mmHg AV Mean Grad:      10.0 mmHg LVOT Vmax:         93.30 cm/s LVOT Vmean:        59.500 cm/s LVOT VTI:          0.151 m LVOT/AV VTI ratio: 0.45 AI PHT:            242 msec  AORTA Ao Root diam: 3.00 cm MITRAL VALVE               TRICUSPID VALVE MV Area (PHT): 7.66 cm    TR Peak grad:   27.5 mmHg MV Area VTI:   3.70 cm    TR Vmax:        262.00 cm/s MV Peak grad:  3.7 mmHg  MV Mean grad:  2.0 mmHg    SHUNTS MV Vmax:       0.96 m/s    Systemic VTI:  0.15 m MV Vmean:      58.5 cm/s   Systemic Diam: 2.00 cm MV Decel Time: 99 msec MV E velocity: 49.20 cm/s MV A velocity: 85.90 cm/s MV E/A ratio:  0.57 Donnelly Angelica Electronically signed by Donnelly Angelica Signature Date/Time: 08/02/2021/1:47:52 PM    Final    US ABDOMEN LIMITED RUQ (LIVER/GB)  Result Date: 08/01/2021 CLINICAL DATA:  epigastric pain, sepsis EXAM: ULTRASOUND ABDOMEN LIMITED RIGHT UPPER QUADRANT COMPARISON:  CT 08/01/2021 FINDINGS: Gallbladder: Dilated gallbladder with large intraluminal stone which measures up to 2.6 cm. No wall thickening or pericholecystic fluid. Reportedly positive sonographic Murphy sign. Common bile duct: Diameter: 4.6 mm, normal.  No intrahepatic biliary ductal dilation. Liver: Diffusely increased liver echogenicity. Portal vein is patent on color Doppler imaging with normal direction of blood flow towards the liver. Other: Right pleural effusion. IMPRESSION: Dilated gallbladder with large intraluminal stone measuring up to 2.6 cm as seen on recent CT. No wall thickening or pericholecystic fluid. Reportedly positive sonographic Murphy sign, which is nonspecific in the setting of suspected pancreatitis. Correlate with exam and laboratory findings. Diffuse hepatic steatosis. Electronically Signed   By: Maurine Simmering M.D.   On: 08/01/2021 21:37     Medications:    sodium chloride 10 mL/hr at 08/03/21 0300   dialysis solution 2.5% low-MG/low-CA     levETIRAcetam Stopped (08/03/21 0233)   meropenem (MERREM) IV 1 g (08/03/21 0900)   norepinephrine (LEVOPHED) Adult infusion Stopped (08/02/21 1719)     stroke: early stages of recovery book   Does not apply Once   Chlorhexidine Gluconate Cloth  6 each Topical Daily   cholecalciferol  2,000 Units Oral Q M,W,F   gentamicin cream  1 application. Topical Daily   heparin injection (subcutaneous)  5,000 Units Subcutaneous Q8H   insulin aspart  0-9 Units  Subcutaneous Q4H   insulin detemir  5 Units Subcutaneous BID   mouth rinse  15 mL Mouth Rinse BID   pantoprazole (PROTONIX) IV  40 mg Intravenous Daily   sevelamer carbonate  1,600 mg Oral BID WC   acetaminophen **OR** acetaminophen, albuterol, dextrose, hydrALAZINE, ondansetron **OR** ondansetron (ZOFRAN) IV  Assessment/ Plan:  Ms. Misty [name] is a 81 y.o.  female past medical history including GERD, hyperlipidemia, hypertension, anemia, and lower back pain, osteoarthritis, diabetes, TIA, and end-stage renal disease on peritoneal dialysis.  Patient presents to the emergency department with altered mental status and nausea and vomiting.  Patient has been admitted for Dehydration [E86.0] ESRD on peritoneal dialysis ([place]) [N18.6, Z99.2] Abdominal pain [R10.9] Altered mental status, unspecified altered mental status type [R41.82] Sepsis due to pneumonia ([place]) [J18.9, A41.9] Acute pancreatitis, unspecified complication status, unspecified pancreatitis type [K85.90] AMS (altered mental status) [R41.82]  [facility]  End-stage renal disease requiring peritoneal dialysis.    Cell count and culture negative from peritoneal fluid.  Attempted peritoneal dialysis treatment overnight, terminated early due to slow flow, PD catheter believed to have fibrin and preventing dialysate flow.  Treatment for this is usually heparin flush however with recent medical conditions including CVA, we are hesitant to proceed with administering heparin for flush or boil at this time.  Discussed with patient's family, husband and daughter, that due to recent medical conditions, if they would like [place] to proceed with dialysis.  It was discussed that if patient was placed in a long-term facility she would need to transition to hemodialysis.  If patient was taken home, they would continue with peritoneal dialysis.  Palliative care has been consulted to determine goals of care.  At this time we will continue with peritoneal  dialysis treatments nightly.  2. Anemia of chronic kidney disease Lab Results  Component Value Date   HGB 9.2 (L) 08/03/2021  Hemoglobin within acceptable range.  3. Secondary Hyperparathyroidism:  Lab Results  Component Value Date   CALCIUM 8.6 (L) 08/03/2021   CAION 1.22 01/23/2021   PHOS 7.8 (H) 08/03/2021  Phosphorus remains elevated, 7.8.  Patient ordered sevelamer with meals however unable to eat at this time due to mentation.  4. Diabetes mellitus type II with chronic kidney disease insulin dependent. Home regimen includes Lantus and Humalog. Most recent hemoglobin A1c is 6.6 on 06/04/21.   Primary team to manage SSI  5.  Acute cholecystitis.  IR consulted and percutaneous cholecystectomy tube placed on 08/02/2021.   LOS: 2 Vyron Fronczak 5/27/202310:46 AM

## 2021-08-03 NOTE — Progress Notes (Signed)
Subjective:  CC: Misty [name] is a 81 y.o. female  Hospital stay day 2,   cholangitis status post IR cholecystostomy tube placement.  HPI: Altered mental status noted overnight.  Work-up consistent with acute infarcts.  Patient currently is minimally responsive per family report.  ROS:  Unable to obtain secondary to patient mentation  Objective:   Temp:  [97.9 F (36.6 C)-99.8 F (37.7 C)] 98.2 F (36.8 C) (05/27 0400) Pulse Rate:  [84-130] 116 (05/27 0900) Resp:  [14-30] 25 (05/27 0900) BP: (88-244)/(43-213) 143/90 (05/27 0900) SpO2:  [94 %-100 %] 100 % (05/27 0900) Weight:  [48.5 kg-49.2 kg] 48.5 kg (05/26 1952)     Height:  (unable to get weight. bed scale is error) Weight: 48.5 kg     Intake/Output this shift:   Intake/Output Summary (Last 24 hours) at 08/03/2021 1039 Last data filed at 08/03/2021 0300 Gross per 24 hour  Intake 1696.48 ml  Output 1305 ml  Net 391.48 ml    Constitutional :  No acute distress.  Respiratory:  clear to auscultation bilaterally  Cardiovascular:  Tacky rate  Gastrointestinal: Soft, no guarding, cholecystostomy tube with bilious drainage .   Skin: Cool and moist  Psychiatric: Normal affect, non-agitated, not confused       LABS:     Latest Ref Rng & Units 08/03/2021    2:29 AM 08/02/2021    2:46 PM 08/02/2021   12:06 PM  CMP  Glucose 70 - 99 mg/dL 202   145   244    BUN 8 - 23 mg/dL 45   37   36    Creatinine 0.44 - 1.00 mg/dL 4.86   4.19   4.08    Sodium 135 - 145 mmol/L 145   148   144    Potassium 3.5 - 5.1 mmol/L 4.9   3.7   3.5    Chloride 98 - 111 mmol/L 109   109   108    CO2 22 - 32 mmol/L '23   24   26    '$ Calcium 8.9 - 10.3 mg/dL 8.6   9.3   9.3    Total Protein 6.5 - 8.1 g/dL 5.0      Total Bilirubin 0.3 - 1.2 mg/dL 0.7      Alkaline Phos 38 - 126 U/L 67      AST 15 - 41 U/L 205      ALT 0 - 44 U/L 56          Latest Ref Rng & Units 08/03/2021    2:29 AM 08/02/2021    4:00 AM 08/01/2021    6:04 PM  CBC  WBC 4.0 -  10.5 K/uL 14.9   17.6   15.9    Hemoglobin 12.0 - 15.0 g/dL 9.2   10.2   12.6    Hematocrit 36.0 - 46.0 % 29.2   32.9   39.9    Platelets 150 - 400 K/uL 188   386   422      RADS: CLINICAL DATA:  Stroke follow-up   EXAM: CT HEAD WITHOUT CONTRAST   TECHNIQUE: Contiguous axial images were obtained from the base of the skull through the vertex without intravenous contrast.   RADIATION DOSE REDUCTION: This exam was performed according to the departmental dose-optimization program which includes automated exposure control, adjustment of the mA and/or kV according to patient size and/or use of iterative reconstruction technique.   COMPARISON:  Yesterday   FINDINGS: Brain: Infarct in the  inferior right cerebellum with narrowing of the fourth ventricle but no hydrocephalus or change in ventricular volume. Underestimated cerebral ischemia compared to MRI. Chronic small vessel ischemic gliosis in the hemispheric white matter. Chronic lacunar infarcts at the deep gray nuclei. Negative for hemorrhage.   Vascular: No hyperdense vessel or unexpected calcification.   Skull: Normal. Negative for fracture or focal lesion.   Sinuses/Orbits: No acute finding.   IMPRESSION: No progression or hemorrhage at the right cerebellar infarct. Fourth ventricular narrowing without hydrocephalus or change in ventricular volume.     Electronically Signed   By: Jorje Guild M.D.   On: 08/03/2021 06:28 Assessment:   Acute cholangitis status post IR cholecystostomy tube placement.  Subsequently developed stroke, currently remains in ICU with minimal response to verbal or physical stimuli.  Patient daughter stated that she understands the grim prognosis after the events for the past several days.  She is requesting for hospice care at this point, after discussing with her father who she states is not quite ready to accept the current clinical situation.  She did state that he is slowly coming to  terms with it.  I confirmed that they did not have any further questions from a surgical standpoint, and it was very clear the daughter understood any additional surgical management will be futile.  Surgery will sign off at this point.  Please call with any additional questions or concerns.  labs/images/medications/previous chart entries reviewed personally and relevant changes/updates noted above.

## 2021-08-03 NOTE — Plan of Care (Signed)
Patient unresponsive this shift, except intermittently to extreme vigorous painful stimulation. Also noted to have intermittent twitching of upper extremities. Only transiently withdrew from receiving mouth care. Left downward gaze also observed. No respiratory distress noted, sat 95-100% on 2LNC. Diminished with crackles throughout with posterior auscultation, not heard anteriorly. ST around 130 earlier in shift, but improved after IV metoprolol to low 100's. Wide fluctuations in BP noted. Received 1 dose of IV hydralazine earlier in shift. ICU covering NP in to evaluate patient for mental status changes; CT scan as well as MRI were completed with stroke protocol. Imaging resulted as positive for stroke. Received 1 dose IV Keppra as ordered. Q2 hour neuro checks performed with minimal changes. Peritoneal dialysis attempted around change of this last night, but unsuccessful due to nonfunctioning catheter. Minimal urine output noted this shift. Pt also noted to have sacral pressure injury, stage 3-4, but previously documented in EMR in March 2023 as stage 2. Will consult wound care. Foam applied and off-loading performed, frequent turning. Skin mottling noted in sacral, perineal, and upper thigh areas. Pt is NPO, is unable to follow any commands; no attempts made to assess swallow ability. Family has been contacted by ICU NP and situation was discussed. Code status updated to DNR. Heparin was discontinued around 11:30 pm. Plan for repeat CT this am.  Problem: Education: Goal: Knowledge of General Education information will improve Description: Including pain rating scale, medication(s)/side effects and non-pharmacologic comfort measures Outcome: Progressing   Problem: Health Behavior/Discharge Planning: Goal: Ability to manage health-related needs will improve Outcome: Progressing   Problem: Clinical Measurements: Goal: Ability to maintain clinical measurements within normal limits will improve Outcome:  Not Progressing Goal: Will remain free from infection Outcome: Not Progressing Goal: Diagnostic test results will improve Outcome: Not Progressing Goal: Respiratory complications will improve Outcome: Progressing Goal: Cardiovascular complication will be avoided Outcome: Not Progressing   Problem: Activity: Goal: Risk for activity intolerance will decrease Outcome: Not Progressing   Problem: Nutrition: Goal: Adequate nutrition will be maintained Outcome: Not Progressing   Problem: Coping: Goal: Level of anxiety will decrease Outcome: Not Progressing   Problem: Elimination: Goal: Will not experience complications related to bowel motility Outcome: Not Progressing Goal: Will not experience complications related to urinary retention Outcome: Not Progressing   Problem: Pain Managment: Goal: General experience of comfort will improve Outcome: Not Progressing   Problem: Safety: Goal: Ability to remain free from injury will improve Outcome: Not Progressing   Problem: Skin Integrity: Goal: Risk for impaired skin integrity will decrease Outcome: Not Progressing   Problem: Education: Goal: Knowledge of disease or condition will improve Outcome: Progressing Goal: Knowledge of secondary prevention will improve (SELECT ALL) Outcome: Progressing Goal: Knowledge of patient specific risk factors will improve (INDIVIDUALIZE FOR PATIENT) Outcome: Progressing

## 2021-08-04 DIAGNOSIS — A419 Sepsis, unspecified organism: Secondary | ICD-10-CM

## 2021-08-04 DIAGNOSIS — J9601 Acute respiratory failure with hypoxia: Secondary | ICD-10-CM | POA: Diagnosis not present

## 2021-08-04 LAB — CBC
HCT: 30.4 % — ABNORMAL LOW (ref 36.0–46.0)
Hemoglobin: 9.2 g/dL — ABNORMAL LOW (ref 12.0–15.0)
MCH: 29.7 pg (ref 26.0–34.0)
MCHC: 30.3 g/dL (ref 30.0–36.0)
MCV: 98.1 fL (ref 80.0–100.0)
Platelets: 241 10*3/uL (ref 150–400)
RBC: 3.1 MIL/uL — ABNORMAL LOW (ref 3.87–5.11)
RDW: 17.2 % — ABNORMAL HIGH (ref 11.5–15.5)
WBC: 17.8 10*3/uL — ABNORMAL HIGH (ref 4.0–10.5)
nRBC: 0.3 % — ABNORMAL HIGH (ref 0.0–0.2)

## 2021-08-04 LAB — BASIC METABOLIC PANEL
Anion gap: 14 (ref 5–15)
BUN: 66 mg/dL — ABNORMAL HIGH (ref 8–23)
CO2: 19 mmol/L — ABNORMAL LOW (ref 22–32)
Calcium: 7.8 mg/dL — ABNORMAL LOW (ref 8.9–10.3)
Chloride: 111 mmol/L (ref 98–111)
Creatinine, Ser: 6.17 mg/dL — ABNORMAL HIGH (ref 0.44–1.00)
GFR, Estimated: 6 mL/min — ABNORMAL LOW (ref >60)
Glucose, Bld: 173 mg/dL — ABNORMAL HIGH (ref 70–99)
Potassium: 6.8 mmol/L (ref 3.5–5.1)
Sodium: 144 mmol/L (ref 135–145)

## 2021-08-04 LAB — HEPATIC FUNCTION PANEL
ALT: 109 U/L — ABNORMAL HIGH (ref 0–44)
AST: 301 U/L — ABNORMAL HIGH (ref 15–41)
Albumin: 1.9 g/dL — ABNORMAL LOW (ref 3.5–5.0)
Alkaline Phosphatase: 84 U/L (ref 38–126)
Bilirubin, Direct: <0.1 mg/dL (ref 0.0–0.2)
Total Bilirubin: 0.7 mg/dL (ref 0.3–1.2)
Total Protein: 5.7 g/dL — ABNORMAL LOW (ref 6.5–8.1)

## 2021-08-04 LAB — MAGNESIUM: Magnesium: 3.1 mg/dL — ABNORMAL HIGH (ref 1.7–2.4)

## 2021-08-04 LAB — PHOSPHORUS: Phosphorus: 11 mg/dL — ABNORMAL HIGH (ref 2.5–4.6)

## 2021-08-04 LAB — GLUCOSE, CAPILLARY: Glucose-Capillary: 177 mg/dL — ABNORMAL HIGH (ref 70–99)

## 2021-08-04 MED ORDER — INSULIN ASPART 100 UNIT/ML IJ SOLN
10.0000 [IU] | Freq: Once | INTRAMUSCULAR | Status: AC
  Administered 2021-08-04: 10 [IU] via INTRAVENOUS
  Filled 2021-08-04: qty 1

## 2021-08-04 MED ORDER — INSULIN ASPART 100 UNIT/ML IJ SOLN: 10.0000 [IU] | Freq: Once | INTRAMUSCULAR | Status: DC

## 2021-08-04 MED ORDER — DEXTROSE 50 % IV SOLN: 1.0000 | Freq: Once | INTRAVENOUS | Status: DC

## 2021-08-04 MED ORDER — DEXTROSE 50 % IV SOLN
1.0000 | Freq: Once | INTRAVENOUS | Status: AC
  Administered 2021-08-04: 50 mL via INTRAVENOUS
  Filled 2021-08-04: qty 50

## 2021-08-05 LAB — BODY FLUID CULTURE W GRAM STAIN
Culture: NO GROWTH
Gram Stain: NONE SEEN

## 2021-08-06 LAB — BLOOD GAS, VENOUS
Acid-base deficit: 0.5 mmol/L (ref 0.0–2.0)
Bicarbonate: 24.2 mmol/L (ref 20.0–28.0)
O2 Saturation: 78.6 %
Patient temperature: 37
pCO2, Ven: 39 mmHg — ABNORMAL LOW (ref 44–60)
pH, Ven: 7.4 (ref 7.25–7.43)
pO2, Ven: 47 mmHg — ABNORMAL HIGH (ref 32–45)

## 2021-08-06 LAB — CULTURE, BLOOD (ROUTINE X 2)
Culture: NO GROWTH
Culture: NO GROWTH

## 2021-08-07 LAB — LEGIONELLA PNEUMOPHILA SEROGP 1 UR AG: L. pneumophila Serogp 1 Ur Ag: NEGATIVE

## 2021-08-08 NOTE — Progress Notes (Signed)
EMS arrived to xfer patient home on hospice, IVs removed, pt family updated on transport time, pt in NAD, receiving 2L O2 via nasal cannula, ready to be discharged.

## 2021-08-08 NOTE — Discharge Summary (Signed)
Physician Discharge Summary  Patient ID: Misty [name] MRN: [id number] DOB/AGE: 06/17/1940 81 y.o.  Admit date: 08/01/2021 Discharge date: 2021-08-08  Admission Diagnoses:  Discharge Diagnoses:  Principal Problem:   AMS (altered mental status) Active Problems:   Diabetes mellitus ([place])   Hypertension   Acute respiratory failure ([place])   ESRD on peritoneal dialysis ([place])   Penicillin allergy   Abdominal pain   Severe sepsis ([place])   Acute cholecystitis   Metabolic acidosis   Respiratory distress   Acute pancreatitis   Acute CVA (cerebrovascular accident) [facility])   Hospice care patient   Palliative care patient   Discharged Condition: hospice  Admission: 81 yo F presenting to [facility] ED from home where she lives with her husband via EMS on 08/01/21 with complaints of altered mental status for the last 2 days with vomiting. Per husband and daughter patient is normally independent of all ADL's and fully oriented until 08/01/21, however, she has been declining this year with the need for a walker/wheelchair use due to frequent falls. Daughter also reports patient has had poor PO intake lately. They deny any additional complaints. Per report she has been compliant with peritoneal dialysis.  ED course: Per ED documentation, upon arrival patient with upper abdominal pain, vomiting and altered mental status. Patient met sepsis criteria and protocol started. Lab work revealed leukocytosis, lactic acidosis, AGMA, hyperglycemia, elevated lipase and cholesterol panel. Minimal ketones in urine, but findings still concerning for HHS/ mild DKA. Dr. Dahlia Byes with general surgery consulted for acute cholecystitis and pancreatitis with recommendation for percutaneous cholecystostomy tube.  Hospital Course:  Acute Hypoxic Respiratory Failure secondary to acute pulmonary edema & multiple metabolic derangements -Supplemental O2 as needed to maintain O2 sats >90% -BiPAP, prn or qHS -Follow intermittent Chest  X-ray & ABG as needed -Bronchodilators as needed -Diuresis as BP and renal function permits ~ received 80 mg IV Lasix yesterday -Pulmonary toilet as able   Septic Shock Acute Decompensated HFpEF 5/26 BNP: >4,500, recheck -Continuous cardiac monitoring -Maintain MAP >65 -Vasopressors as needed to maintain MAP goal -Trend lactic acid until normalized -Trend HS Troponin until peaked -Echocardiogram EF 35-40%   Severe Sepsis due to suspected acute cholecystitis & pancreatitis Lactic: 4.8 - 5.1 - >9.0 Initial interventions/workup included: 1.3 L of NS/LR & Cefepime/ Metronidazole -Monitor fever curve -Trend WBC's & Procalcitonin -Follow cultures as above -Continue empiric Cefepime & Flagyl pending cultures & sensitivities -General surgery following, appreciate input ~ plan for IR placement of percutaneous drain 5/26 - monitor hepatic fxn, lipase & amylase   Hyperglycemia secondary to mild Diabetic ketoacidosis vs HHS Worsening AGMA PMHx: T2DM - Hyperglycemia protocol initiated > will hold off on continuous IVF due to HFpEF exacerbation and tenuous respiratory status, reassess when CBG drops < 250 -Follow DKA/HHS protocol -Insulin drip -Follow BMP every 4 hours -Once anion gap closed and serum bicarb greater than 20, can convert to long-acting insulin and sliding scale insulin (diabetes coronary recommends 5 units Levemir twice daily and sensitive sliding scale) - Diabetes coordinator consult   ESRD on PD -Monitor I&O's / urinary output -Follow BMP -Ensure adequate renal perfusion -Avoid nephrotoxic agents as able -Replace electrolytes as indicated -Nephrology following, appreciate input ~ PD as per Nephrology   NEURO: Acute Encephalopathy suspect acute delirium multifocal in the setting of severe sepsis, acidosis and severe hyperglycemia Acute Stroke:  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 No hemorrhage. Mild mass effect on fourth ventricle but no significant interval ventricular enlargement since CT performed yesterday. Atrophy and chronic small vessel ischemic changes of the white Matter CODE STROKE called last night, discussed with Tele-neurologist Dr. Ramon Dredge bedside.  Due to renal function f/u MRI/MRA wo contrast ordered instead of CT head/neck with perfusion.   Neurological Exam 5/26 RASS: -4, GCS: 6. Withdraws from pain initially in [place], now only in [place] with deep sternal rub. RUE 2/3. No mvmt in BLE to painful stimuli. Left deviated gaze, downward at times.  NIHSS: 32   Addendum: MRI/MRA showing Moderate to large area of acute infarct in the inferior right cerebellum, in the right PICA territory. On the MRA, the right vertebral artery demonstrates particularly poor signal proximally in the right PICA is not visualized. Additional scattered, primarily cortical, acute infarcts in the bilateral frontal lobes, parietal lobes, and occipital lobes, as well as the left temporal lobe. Multifocal mild narrowing in the right ACA and bilateral MCAs. Discussed with tele-neurologist, patient not a candidate for intervention. Recommendations below: - supportive care - Repeat CT 6 am- looking for hydrocephalus/herniation - keppra 250 mg daily - f/u EEG Patient is critically ill, high risk for deterioration, need for mechanical ventilation, cardiac arrest and death.  Recommend DNR/DNI status.  Palliative care consulted to assist with goals of care.   Updated patient's husband at bedside 5/26..   With the overall prognosis poor and opposing medical management goals for her multiple problems, neurology and palliative had long conversation with family who wish that she be able to return home with hospice.       Consults: neurology and general surgery  Treatments:  Comfort  Discharge Exam: Blood pressure (!) 98/44, pulse (!) 113, temperature 98.4 F (36.9 C), temperature source Axillary, resp. rate (!) 23, weight 48.5 kg, SpO2 99 %.  LNG:        rhonchi CVS:        IRR ABD:        SNT, drain in place CNS:        Stuporous  Disposition:  Home with Hospice   Allergies as of 17-Aug-2021       Reactions   Succinylcholine Other (See Comments)   Severe myalgias   Penicillins Rash   Tolerated cefazolin prior        Medication List     STOP taking these medications    acetaminophen 325 MG tablet Commonly known as: TYLENOL   carvedilol 12.5 MG tablet Commonly known as: COREG   cetirizine 10 MG tablet Commonly known as: ZYRTEC   colestipol 1 g tablet Commonly known as: COLESTID   Fiber Diet Tabs   gabapentin 300 MG capsule Commonly known as: NEURONTIN   glucose blood test strip   HumaLOG 100 UNIT/ML injection Generic drug: insulin lispro   hydrocortisone 2.5 % rectal cream Commonly known as: ANUSOL-HC   Hydrocortisone Acetate 2.5 % Crea   INSULIN SYRINGE 1CC/29G 29G X 1/2" 1 ML Misc   Lantus SoloStar 100 UNIT/ML Solostar Pen Generic drug: insulin glargine   lidocaine 5 % Commonly known as: LIDODERM   methocarbamol 500 MG tablet Commonly known as: Robaxin   multivitamin Tabs tablet   ondansetron 4 MG tablet Commonly known as: ZOFRAN   pantoprazole 40 MG tablet Commonly known as: Protonix   sevelamer carbonate 800 MG tablet Commonly known as: RENVELA   Sure Comfort Pen Needles  31G X 8 MM Misc Generic drug: Insulin Pen Needle   Vitamin D 50 MCG (2000 UT) tablet         Signed: Nelle Don Aug 06, 2021, 8:53 AM

## 2021-08-08 NOTE — TOC Progression Note (Addendum)
Transition of Care [facility]) - Progression Note    Patient Details  Name: Misty [name] MRN: [id number] Date of Birth: Mar 19, 1940  Transition of Care [facility]) CM/SW [place], [place] Phone Number: Sep 03, 2021, 8:35 AM  Clinical Narrative:     CSW called EMS transport to follow up. They were scheduled to come pick up the patient at 8 a.m. this morning. They reported they are working on coming to get the patient but are running behind. No estimated time was given.        Expected Discharge Plan and Services                                                 Social Determinants of Health (SDOH) Interventions    Readmission Risk Interventions    06/06/2021   11:55 AM  Readmission Risk Prevention Plan  Transportation Screening Complete  HRI or Home Care Consult Complete  Palliative Care Screening Not Applicable  Medication Review (RN Care Manager) Complete

## 2021-08-08 NOTE — Plan of Care (Signed)
Current plan is for patient to be discharged to home with hospice care today. According to DC planning notes, arrangements are made for transportation this am.  Problem: Education: Goal: Knowledge of General Education information will improve Description: Including pain rating scale, medication(s)/side effects and non-pharmacologic comfort measures Outcome: Progressing   Problem: Health Behavior/Discharge Planning: Goal: Ability to manage health-related needs will improve Outcome: Progressing   Problem: Clinical Measurements: Goal: Ability to maintain clinical measurements within normal limits will improve Outcome: Not Progressing Goal: Will remain free from infection Outcome: Not Progressing Goal: Diagnostic test results will improve Outcome: Not Progressing Goal: Respiratory complications will improve Outcome: Not Progressing Goal: Cardiovascular complication will be avoided Outcome: Not Progressing   Problem: Activity: Goal: Risk for activity intolerance will decrease Outcome: Not Progressing   Problem: Nutrition: Goal: Adequate nutrition will be maintained Outcome: Not Progressing   Problem: Coping: Goal: Level of anxiety will decrease Outcome: Not Applicable   Problem: Elimination: Goal: Will not experience complications related to bowel motility Outcome: Not Applicable Goal: Will not experience complications related to urinary retention Outcome: Not Applicable   Problem: Pain Managment: Goal: General experience of comfort will improve Outcome: Progressing   Problem: Safety: Goal: Ability to remain free from injury will improve Outcome: Not Progressing   Problem: Skin Integrity: Goal: Risk for impaired skin integrity will decrease Outcome: Not Progressing   Problem: Education: Goal: Knowledge of disease or condition will improve Outcome: Not Applicable Goal: Knowledge of secondary prevention will improve (SELECT ALL) Outcome: Not Applicable Goal:  Knowledge of patient specific risk factors will improve (INDIVIDUALIZE FOR PATIENT) Outcome: Not Applicable

## 2021-08-08 DEATH — deceased

## 2021-08-12 ENCOUNTER — Ambulatory Visit: Payer: Medicare Other | Admitting: Family

## 2021-09-04 LAB — BLOOD GAS, VENOUS
Acid-Base Excess: 3 mmol/L — ABNORMAL HIGH (ref 0.0–2.0)
Bicarbonate: 22 mmol/L (ref 20.0–28.0)
O2 Saturation: 36.4 %
Patient temperature: 37
pCO2, Ven: 38 mmHg — ABNORMAL LOW (ref 44–60)
pH, Ven: 7.37 (ref 7.25–7.43)
pO2, Ven: 27 mmHg — CL (ref 32–45)
